# Patient Record
Sex: Female | Born: 1945 | Race: White | Hispanic: No | Smoking: Former smoker
Health system: Southern US, Community
[De-identification: ages and names within clinical notes are randomized; demographics above are authoritative.]

## PROBLEM LIST (undated history)

## (undated) DIAGNOSIS — Z8719 Personal history of other diseases of the digestive system: Secondary | ICD-10-CM

## (undated) DIAGNOSIS — D649 Anemia, unspecified: Secondary | ICD-10-CM

## (undated) DIAGNOSIS — F419 Anxiety disorder, unspecified: Secondary | ICD-10-CM

## (undated) DIAGNOSIS — I10 Essential (primary) hypertension: Secondary | ICD-10-CM

## (undated) DIAGNOSIS — E039 Hypothyroidism, unspecified: Secondary | ICD-10-CM

## (undated) HISTORY — PX: ABDOMINAL HYSTERECTOMY: SHX81

## (undated) HISTORY — PX: TUBAL LIGATION: SHX77

## (undated) HISTORY — PX: TONSILLECTOMY: SUR1361

---

## 2000-05-02 ENCOUNTER — Encounter: Payer: Self-pay | Admitting: *Deleted

## 2000-05-02 ENCOUNTER — Ambulatory Visit (HOSPITAL_COMMUNITY): Admission: RE | Admit: 2000-05-02 | Discharge: 2000-05-02 | Payer: Self-pay | Admitting: *Deleted

## 2001-05-06 ENCOUNTER — Encounter: Payer: Self-pay | Admitting: Specialist

## 2001-05-06 ENCOUNTER — Ambulatory Visit (HOSPITAL_COMMUNITY): Admission: RE | Admit: 2001-05-06 | Discharge: 2001-05-06 | Payer: Self-pay | Admitting: Specialist

## 2001-06-19 ENCOUNTER — Encounter: Payer: Self-pay | Admitting: Internal Medicine

## 2001-06-19 ENCOUNTER — Ambulatory Visit (HOSPITAL_COMMUNITY): Admission: RE | Admit: 2001-06-19 | Discharge: 2001-06-19 | Payer: Self-pay | Admitting: Internal Medicine

## 2001-12-25 ENCOUNTER — Emergency Department (HOSPITAL_COMMUNITY): Admission: EM | Admit: 2001-12-25 | Discharge: 2001-12-25 | Payer: Self-pay | Admitting: Internal Medicine

## 2001-12-25 ENCOUNTER — Encounter: Payer: Self-pay | Admitting: Internal Medicine

## 2002-07-07 ENCOUNTER — Ambulatory Visit (HOSPITAL_COMMUNITY): Admission: RE | Admit: 2002-07-07 | Discharge: 2002-07-07 | Payer: Self-pay | Admitting: General Surgery

## 2002-11-06 ENCOUNTER — Ambulatory Visit (HOSPITAL_COMMUNITY): Admission: RE | Admit: 2002-11-06 | Discharge: 2002-11-06 | Payer: Self-pay | Admitting: Specialist

## 2003-07-14 ENCOUNTER — Ambulatory Visit (HOSPITAL_COMMUNITY): Admission: RE | Admit: 2003-07-14 | Discharge: 2003-07-14 | Payer: Self-pay | Admitting: Internal Medicine

## 2005-09-18 ENCOUNTER — Encounter (INDEPENDENT_AMBULATORY_CARE_PROVIDER_SITE_OTHER): Payer: Self-pay | Admitting: Specialist

## 2005-09-18 ENCOUNTER — Ambulatory Visit (HOSPITAL_COMMUNITY): Admission: RE | Admit: 2005-09-18 | Discharge: 2005-09-18 | Payer: Self-pay | Admitting: General Surgery

## 2005-09-25 ENCOUNTER — Ambulatory Visit (HOSPITAL_COMMUNITY): Admission: RE | Admit: 2005-09-25 | Discharge: 2005-09-25 | Payer: Self-pay | Admitting: Specialist

## 2007-01-27 ENCOUNTER — Ambulatory Visit (HOSPITAL_COMMUNITY): Admission: RE | Admit: 2007-01-27 | Discharge: 2007-01-27 | Payer: Self-pay | Admitting: Obstetrics and Gynecology

## 2007-06-24 ENCOUNTER — Ambulatory Visit (HOSPITAL_COMMUNITY): Admission: RE | Admit: 2007-06-24 | Discharge: 2007-06-24 | Payer: Self-pay | Admitting: Internal Medicine

## 2007-06-25 ENCOUNTER — Ambulatory Visit (HOSPITAL_COMMUNITY): Admission: RE | Admit: 2007-06-25 | Discharge: 2007-06-25 | Payer: Self-pay | Admitting: Internal Medicine

## 2008-12-07 ENCOUNTER — Ambulatory Visit (HOSPITAL_COMMUNITY): Admission: RE | Admit: 2008-12-07 | Discharge: 2008-12-07 | Payer: Self-pay | Admitting: Family Medicine

## 2009-10-12 ENCOUNTER — Encounter
Admission: RE | Admit: 2009-10-12 | Discharge: 2009-10-12 | Payer: Self-pay | Source: Home / Self Care | Attending: Endocrinology | Admitting: Endocrinology

## 2009-10-25 ENCOUNTER — Ambulatory Visit (HOSPITAL_COMMUNITY)
Admission: RE | Admit: 2009-10-25 | Discharge: 2009-10-25 | Payer: Self-pay | Source: Home / Self Care | Admitting: Ophthalmology

## 2009-12-22 ENCOUNTER — Encounter
Admission: RE | Admit: 2009-12-22 | Discharge: 2010-02-07 | Payer: Self-pay | Source: Home / Self Care | Attending: Endocrinology | Admitting: Endocrinology

## 2010-03-22 LAB — POCT I-STAT 4, (NA,K, GLUC, HGB,HCT)
Glucose, Bld: 187 mg/dL — ABNORMAL HIGH (ref 70–99)
HCT: 44 % (ref 36.0–46.0)
Hemoglobin: 15 g/dL (ref 12.0–15.0)
Potassium: 4.3 mEq/L (ref 3.5–5.1)

## 2010-04-21 ENCOUNTER — Other Ambulatory Visit (HOSPITAL_COMMUNITY): Payer: Self-pay | Admitting: Internal Medicine

## 2010-04-21 ENCOUNTER — Ambulatory Visit (HOSPITAL_COMMUNITY)
Admission: RE | Admit: 2010-04-21 | Discharge: 2010-04-21 | Disposition: A | Discharge status: Home / Self Care | Payer: Medicare Other | Source: Ambulatory Visit | Attending: Internal Medicine | Admitting: Internal Medicine

## 2010-04-21 ENCOUNTER — Encounter (HOSPITAL_COMMUNITY): Payer: Self-pay

## 2010-04-21 DIAGNOSIS — M169 Osteoarthritis of hip, unspecified: Secondary | ICD-10-CM

## 2010-04-21 DIAGNOSIS — M25559 Pain in unspecified hip: Secondary | ICD-10-CM | POA: Insufficient documentation

## 2010-04-21 HISTORY — DX: Essential (primary) hypertension: I10

## 2010-05-26 NOTE — H&P (Signed)
NAME:  Erin Hoffman, INSINGA NO.:  [id number]   MEDICAL RECORD NO.:  [id number]                  PATIENT TYPE:   LOCATION:                                       FACILITY:   PHYSICIAN:  Dalia Heading, M.D.               DATE OF BIRTH:  1945/03/15   DATE OF ADMISSION:  07/07/2002  DATE OF DISCHARGE:                                HISTORY & PHYSICAL   CHIEF COMPLAINT:  GERD, epigastric pain, hematochezia.   HISTORY OF PRESENT ILLNESS:  The patient is a 65 year old white female who  is referred for endoscopic evaluation.  She has been having hematochezia and  epigastric pain as well as GERD symptoms recently.  They were made worse  with taking ibuprofen.  She has since stopped the ibuprofen but continues to  have epigastric pain.  She describes substernal chest pain/heartburn.  She  denies any lightheadedness, weight loss, fever, constipation, diarrhea,  melena, or history of peptic ulcer disease.  She denies any hemorrhoidal  problems.  She has never had a colonoscopy.  She denies any immediate family  history of colon carcinoma.   PAST MEDICAL HISTORY:  Unremarkable.   PAST SURGICAL HISTORY:  1. Tonsillectomy.  2. Tubal ligation.  3. Hysterectomy.   CURRENT MEDICATIONS:  Premarin, Xanax, baby aspirin, hydrocodone.   ALLERGIES:  No known drug allergies.   REVIEW OF SYSTEMS:  Noncontributory.   PHYSICAL EXAMINATION:  GENERAL:  Well-developed, well-nourished white female  in no acute distress.  VITAL SIGNS:  Afebrile, and vital signs are stable.  LUNGS:  Clear to auscultation with equal breath sounds bilaterally.  HEART:  Regular rate and rhythm.  Without S3, S4, or murmurs.  ABDOMEN:  Soft, nontender, nondistended.  No hepatosplenomegaly or masses  are noted.  RECTAL:  Deferred until the procedure.   IMPRESSION:  Gastroesophageal reflux disease, epigastric pain, hematochezia.    PLAN:  The patient is scheduled for an EGD and colonoscopy on  July 07, 2002.  The risks and benefits of the procedure including bleeding and perforation  were fully explained to the patient, who gave informed consent.                                               Dalia Heading, M.D.    MAJ/MEDQ  D:  06/25/2002  T:  06/25/2002  Job:  [id number]   cc:   Madelin Rear. Sherwood Gambler, M.D.  [address]  [place]  [place] [address]  Fax: [phone]

## 2010-05-26 NOTE — Op Note (Signed)
NAME:  Erin Hoffman, Erin Hoffman NO.:  [id number]   MEDICAL RECORD NO.:  [id number]          PATIENT TYPE:  AMB   LOCATION:  DAY                          FACILITY:  [facility]   PHYSICIAN:  Adolph Pollack, M.D.DATE OF BIRTH:  [birth year]   DATE OF PROCEDURE:  09/18/2005  DATE OF DISCHARGE:                                 OPERATIVE REPORT   PREOPERATIVE DIAGNOSIS:  Cholelithiasis.   POSTOPERATIVE DIAGNOSIS:  Cholelithiasis.   PROCEDURE:  Laparoscopic cholecystectomy, intraoperative cholangiogram.   SURGEON:  Adolph Pollack, M.D.   ANESTHESIA:  General.   INDICATIONS:  This is a 65 year old female who has had slight elevation of  some of her transaminases.  She also had some intermittent pressure  discomfort of the lower substernal right upper quadrant region with a little  bit of bloating.  Ultrasound demonstrated a 2.8 cm solitary gallstone, a  normal common bile duct.  Because of some of her symptoms and the size of  the gallstone, I suggested elective laparoscopic cholecystectomy.  She is  agreeable.  Preoperatively her SGOT is 54, SGPT 52.   TECHNIQUE:  She was brought to the operating room, placed supine on the  operating table and general anesthetic was administered.  Her abdominal wall  was sterilely prepped and draped.  A supraumbilical incision was made  through skin and subcutaneous tissue, fascia and peritoneum entering the  peritoneal cavity under direct vision.  Omental adhesions were noted under  direct vision.  A pursestring suture of 0 Vicryl was placed around the  fascial edges.  A Hassan trocar was introduced into the peritoneal cavity  and pneumoperitoneum created by insufflation of CO2 gas.   Next a laparoscope was introduced.  The midline demonstrated adhesions  between the omentum and the abdominal wall.  I was able to advance the  laparoscope up toward the left upper quadrant and found a window into the  right upper quadrant.  I placed an  11-mm trocar in the epigastric incision  and two 5 mm trocars and right mid lateral abdomen.  She is placed in  reverse Trendelenburg position and right side tilted slightly upward.   The fundus of the gallbladder was grasped and filmy adhesions between the  omentum, gallbladder duodenum was taken down sharply and bluntly.  The  fundus was then retracted toward the right shoulder and the infundibulum was  mobilized bluntly.  I then was able to isolate the cystic duct and create a  window around it.  A clip was placed at the cystic duct gallbladder  junction.  A small incision was made in the cystic duct and bile milked  back.  A cholangiocath was passed into the cystic duct and cholangiogram was  performed.   Under real time fluoroscopy, dilute contrast material was injected to the  cystic duct which was of long length.  The common hepatic, right hepatic,  and left hepatic ducts were opacified promptly.  The common bile duct  opacified promptly and contrast drained into the duodenum without obvious  evidence of obstruction.   The cholangiocatheter was then removed, the cystic duct was clipped three  times proximally divided.  I then performed somewhat of an inspection of  liver and notice she had diffuse fatty infiltration which may account for  the very mild elevation of her SGOT and SGPT.   Using blunt dissection, I identified both the anterior and posterior  branches of the cystic artery and created windows around them.  There were  clipped and divided.  The gallbladder was dissected free from the liver  intact using electrocautery and placed in an Endopouch bag.  The gallbladder  fossa was copiously irrigated.  It was hemostatic.  No bile leak was noted.  Irrigation was evacuated.   Some adhesions were taken down around the upper midline so I could visualize  the supraumbilical trocar better.  The gallbladder was then removed through  the supraumbilical port in the Endopouch bag.   I partially closed the  fascial defect in the supraumbilical region by tightening and tying down the  pursestring suture.  I added an extra suture to close a small remaining  defect of 0 Vicryl which allowed for adequate fascial closure.   The 5 mm trocars in the right abdomen were removed and the pneumoperitoneum  was released and then an 11-mm trocar in the epigastric region was removed.  The skin incisions were closed with 4-0 Monocryl subcuticular stitches  followed by Steri-Strips and sterile dressings.   She tolerated procedure without apparent complications and was taken to  recovery in satisfactory condition.      Adolph Pollack, M.D.  Electronically Signed     TJR/MEDQ  D:  09/18/2005  T:  09/19/2005  Job:  [id number]   cc:   Madelin Rear. Sherwood Gambler, MD  Fax: [phone]

## 2011-07-23 ENCOUNTER — Other Ambulatory Visit (HOSPITAL_COMMUNITY): Payer: Self-pay | Admitting: Internal Medicine

## 2011-07-23 DIAGNOSIS — Z0000 Encounter for general adult medical examination without abnormal findings: Secondary | ICD-10-CM

## 2011-07-23 DIAGNOSIS — Z139 Encounter for screening, unspecified: Secondary | ICD-10-CM

## 2011-07-26 ENCOUNTER — Other Ambulatory Visit (HOSPITAL_COMMUNITY): Payer: Medicare Other

## 2011-07-31 ENCOUNTER — Ambulatory Visit (HOSPITAL_COMMUNITY)
Admission: RE | Admit: 2011-07-31 | Discharge: 2011-07-31 | Disposition: A | Discharge status: Home / Self Care | Payer: Medicare Other | Source: Ambulatory Visit | Attending: Internal Medicine | Admitting: Internal Medicine

## 2011-07-31 DIAGNOSIS — Z780 Asymptomatic menopausal state: Secondary | ICD-10-CM | POA: Insufficient documentation

## 2011-07-31 DIAGNOSIS — Z139 Encounter for screening, unspecified: Secondary | ICD-10-CM

## 2011-07-31 DIAGNOSIS — Z13820 Encounter for screening for osteoporosis: Secondary | ICD-10-CM | POA: Insufficient documentation

## 2011-07-31 DIAGNOSIS — Z0000 Encounter for general adult medical examination without abnormal findings: Secondary | ICD-10-CM

## 2011-07-31 DIAGNOSIS — Z9079 Acquired absence of other genital organ(s): Secondary | ICD-10-CM | POA: Insufficient documentation

## 2011-09-09 DEATH — deceased

## 2011-10-11 ENCOUNTER — Ambulatory Visit (INDEPENDENT_AMBULATORY_CARE_PROVIDER_SITE_OTHER): Payer: Medicare Other | Admitting: Otolaryngology

## 2011-10-11 DIAGNOSIS — R040 Epistaxis: Secondary | ICD-10-CM

## 2011-11-08 ENCOUNTER — Ambulatory Visit (INDEPENDENT_AMBULATORY_CARE_PROVIDER_SITE_OTHER): Payer: Medicare Other | Admitting: Otolaryngology

## 2011-11-08 DIAGNOSIS — R040 Epistaxis: Secondary | ICD-10-CM

## 2012-01-08 ENCOUNTER — Other Ambulatory Visit (HOSPITAL_COMMUNITY): Payer: Self-pay | Admitting: Internal Medicine

## 2012-01-08 DIAGNOSIS — Z139 Encounter for screening, unspecified: Secondary | ICD-10-CM

## 2012-01-11 ENCOUNTER — Ambulatory Visit (HOSPITAL_COMMUNITY): Payer: Medicare Other

## 2012-01-28 ENCOUNTER — Ambulatory Visit (HOSPITAL_COMMUNITY)
Admission: RE | Admit: 2012-01-28 | Discharge: 2012-01-28 | Disposition: A | Discharge status: Home / Self Care | Payer: Medicare Other | Source: Ambulatory Visit | Attending: Internal Medicine | Admitting: Internal Medicine

## 2012-01-28 DIAGNOSIS — Z1231 Encounter for screening mammogram for malignant neoplasm of breast: Secondary | ICD-10-CM | POA: Insufficient documentation

## 2012-01-28 DIAGNOSIS — Z139 Encounter for screening, unspecified: Secondary | ICD-10-CM

## 2012-01-30 ENCOUNTER — Other Ambulatory Visit: Payer: Self-pay | Admitting: Internal Medicine

## 2012-01-30 DIAGNOSIS — R928 Other abnormal and inconclusive findings on diagnostic imaging of breast: Secondary | ICD-10-CM

## 2012-02-20 ENCOUNTER — Encounter (HOSPITAL_COMMUNITY): Payer: Medicare Other

## 2012-03-05 ENCOUNTER — Ambulatory Visit (HOSPITAL_COMMUNITY)
Admission: RE | Admit: 2012-03-05 | Discharge: 2012-03-05 | Disposition: A | Discharge status: Home / Self Care | Payer: Medicare Other | Source: Ambulatory Visit | Attending: Internal Medicine | Admitting: Internal Medicine

## 2012-03-05 DIAGNOSIS — R928 Other abnormal and inconclusive findings on diagnostic imaging of breast: Secondary | ICD-10-CM | POA: Insufficient documentation

## 2013-05-15 ENCOUNTER — Other Ambulatory Visit (HOSPITAL_COMMUNITY): Payer: Self-pay | Admitting: Internal Medicine

## 2013-05-15 DIAGNOSIS — Z139 Encounter for screening, unspecified: Secondary | ICD-10-CM

## 2013-05-25 ENCOUNTER — Ambulatory Visit (HOSPITAL_COMMUNITY): Payer: Medicare Other

## 2013-05-28 ENCOUNTER — Ambulatory Visit (HOSPITAL_COMMUNITY)
Admission: RE | Admit: 2013-05-28 | Discharge: 2013-05-28 | Disposition: A | Discharge status: Home / Self Care | Payer: Medicare Other | Source: Ambulatory Visit | Attending: Internal Medicine | Admitting: Internal Medicine

## 2013-05-28 ENCOUNTER — Encounter (HOSPITAL_COMMUNITY): Payer: Self-pay | Admitting: Pharmacy Technician

## 2013-05-28 ENCOUNTER — Ambulatory Visit (HOSPITAL_COMMUNITY): Payer: Medicare Other

## 2013-05-28 DIAGNOSIS — Z139 Encounter for screening, unspecified: Secondary | ICD-10-CM

## 2013-05-28 DIAGNOSIS — Z1231 Encounter for screening mammogram for malignant neoplasm of breast: Secondary | ICD-10-CM | POA: Insufficient documentation

## 2013-06-09 NOTE — Patient Instructions (Signed)
GWYNNE MUSCARELLA  06/09/2013   Your procedure is scheduled on:  06/16/13  Report to [facility] at 7:15 AM.  Call this number if you have problems the morning of surgery: [phone]   Remember:   Do not eat food or drink liquids after midnight.   Take these medicines the morning of surgery with A SIP OF WATER: Xanax, Vasotec, Allegra,  Synthroid  DO NOT TAKE AMARYL OR METFORMIN   Do not wear jewelry, make-up or nail polish.  Do not wear lotions, powders, or perfumes. You may wear deodorant.  Do not shave 48 hours prior to surgery. Men may shave face and neck.  Do not bring valuables to the hospital.  [facility] is not responsible for any belongings or valuables.               Contacts, dentures or bridgework may not be worn into surgery.  Leave suitcase in the car. After surgery it may be brought to your room.  For patients admitted to the hospital, discharge time is determined by your treatment team.               Patients discharged the day of surgery will not be allowed to drive home.  Name and phone number of your driver:  Special Instructions: N/A   Please read over the following fact sheets that you were given: Anesthesia Post-op Instructions   PATIENT INSTRUCTIONS POST-ANESTHESIA  IMMEDIATELY FOLLOWING SURGERY:  Do not drive or operate machinery for the first twenty four hours after surgery.  Do not make any important decisions for twenty four hours after surgery or while taking narcotic pain medications or sedatives.  If you develop intractable nausea and vomiting or a severe headache please notify your doctor immediately.  FOLLOW-UP:  Please make an appointment with your surgeon as instructed. You do not need to follow up with anesthesia unless specifically instructed to do so.  WOUND CARE INSTRUCTIONS (if applicable):  Keep a dry clean dressing on the anesthesia/puncture wound site if there is drainage.  Once the wound has quit draining you may leave it open to air.   Generally you should leave the bandage intact for twenty four hours unless there is drainage.  If the epidural site drains for more than 36-48 hours please call the anesthesia department.  QUESTIONS?:  Please feel free to call your physician or the hospital operator if you have any questions, and they will be happy to assist you.      Cataract A cataract is a clouding of the lens of the eye. When a lens becomes cloudy, vision is reduced based on the degree and nature of the clouding. Many cataracts reduce vision to some degree. Some cataracts make people more near-sighted as they develop. Other cataracts increase glare. Cataracts that are ignored and become worse can sometimes look white. The white color can be seen through the pupil. CAUSES   Aging. However, cataracts may occur at any age, even in newborns.  Certain drugs.  Trauma to the eye.  Certain diseases such as diabetes.  Specific eye diseases such as chronic inflammation inside the eye or a sudden attack of a rare form of glaucoma.  Inherited or acquired medical problems. SYMPTOMS   Gradual, progressive drop in vision in the affected eye.  Severe, rapid visual loss. This most often happens when trauma is the cause. DIAGNOSIS  To detect a cataract, an eye doctor examines the lens. Cataracts are best diagnosed with an exam of the  eyes with the pupils enlarged (dilated) by drops.  TREATMENT  For an early cataract, vision may improve by using different eyeglasses or stronger lighting. If that does not help your vision, surgery is the only effective treatment. A cataract needs to be surgically removed when vision loss interferes with your everyday activities, such as driving, reading, or watching TV. A cataract may also have to be removed if it prevents examination or treatment of another eye problem. Surgery removes the cloudy lens and usually replaces it with a substitute lens (intraocular lens, IOL).  At a time when both you and  your doctor agree, the cataract will be surgically removed. If you have cataracts in both eyes, only one is usually removed at a time. This allows the operated eye to heal and be out of danger from any possible problems after surgery (such as infection or poor wound healing). In rare cases, a cataract may be doing damage to your eye. In these cases, your caregiver may advise surgical removal right away. The vast majority of people who have cataract surgery have better vision afterward. HOME CARE INSTRUCTIONS  If you are not planning surgery, you may be asked to do the following:  Use different eyeglasses.  Use stronger or brighter lighting.  Ask your eye doctor about reducing your medicine dose or changing medicines if it is thought that a medicine caused your cataract. Changing medicines does not make the cataract go away on its own.  Become familiar with your surroundings. Poor vision can lead to injury. Avoid bumping into things on the affected side. You are at a higher risk for tripping or falling.  Exercise extreme care when driving or operating machinery.  Wear sunglasses if you are sensitive to bright light or experiencing problems with glare. SEEK IMMEDIATE MEDICAL CARE IF:   You have a worsening or sudden vision loss.  You notice redness, swelling, or increasing pain in the eye.  You have a fever. Document Released: 12/25/2004 Document Revised: 03/19/2011 Document Reviewed: 08/18/2010 [facility]Exit[facility] Patient Information 2014 [employer], [place].

## 2013-06-10 ENCOUNTER — Encounter (HOSPITAL_COMMUNITY)
Admission: RE | Admit: 2013-06-10 | Discharge: 2013-06-10 | Disposition: A | Discharge status: Home / Self Care | Payer: Medicare Other | Source: Ambulatory Visit | Attending: Ophthalmology | Admitting: Ophthalmology

## 2013-06-10 ENCOUNTER — Encounter (HOSPITAL_COMMUNITY): Payer: Self-pay

## 2013-06-10 DIAGNOSIS — Z01810 Encounter for preprocedural cardiovascular examination: Secondary | ICD-10-CM | POA: Insufficient documentation

## 2013-06-10 HISTORY — DX: Personal history of other diseases of the digestive system: Z87.19

## 2013-06-10 HISTORY — DX: Anemia, unspecified: D64.9

## 2013-06-10 HISTORY — DX: Anxiety disorder, unspecified: F41.9

## 2013-06-10 HISTORY — DX: Hypothyroidism, unspecified: E03.9

## 2013-06-16 ENCOUNTER — Encounter (HOSPITAL_COMMUNITY): Payer: Medicare Other | Admitting: Anesthesiology

## 2013-06-16 ENCOUNTER — Encounter (HOSPITAL_COMMUNITY): Payer: Self-pay | Admitting: *Deleted

## 2013-06-16 ENCOUNTER — Ambulatory Visit (HOSPITAL_COMMUNITY)
Admission: RE | Admit: 2013-06-16 | Discharge: 2013-06-16 | Disposition: A | Discharge status: Home / Self Care | Payer: Medicare Other | Source: Ambulatory Visit | Attending: Ophthalmology | Admitting: Ophthalmology

## 2013-06-16 ENCOUNTER — Ambulatory Visit (HOSPITAL_COMMUNITY): Payer: Medicare Other | Admitting: Anesthesiology

## 2013-06-16 ENCOUNTER — Encounter (HOSPITAL_COMMUNITY)
Admission: RE | Disposition: A | Discharge status: Home / Self Care | Payer: Self-pay | Source: Ambulatory Visit | Attending: Ophthalmology

## 2013-06-16 DIAGNOSIS — E119 Type 2 diabetes mellitus without complications: Secondary | ICD-10-CM | POA: Insufficient documentation

## 2013-06-16 DIAGNOSIS — F411 Generalized anxiety disorder: Secondary | ICD-10-CM | POA: Insufficient documentation

## 2013-06-16 DIAGNOSIS — Z794 Long term (current) use of insulin: Secondary | ICD-10-CM | POA: Insufficient documentation

## 2013-06-16 DIAGNOSIS — I10 Essential (primary) hypertension: Secondary | ICD-10-CM | POA: Insufficient documentation

## 2013-06-16 DIAGNOSIS — Z79899 Other long term (current) drug therapy: Secondary | ICD-10-CM | POA: Insufficient documentation

## 2013-06-16 DIAGNOSIS — H2510 Age-related nuclear cataract, unspecified eye: Secondary | ICD-10-CM | POA: Insufficient documentation

## 2013-06-16 HISTORY — PX: CATARACT EXTRACTION W/PHACO: SHX586

## 2013-06-16 LAB — GLUCOSE, CAPILLARY: GLUCOSE-CAPILLARY: 150 mg/dL — AB (ref 70–99)

## 2013-06-16 SURGERY — PHACOEMULSIFICATION, CATARACT, WITH IOL INSERTION
Anesthesia: Monitor Anesthesia Care | Site: Eye | Laterality: Left

## 2013-06-16 MED ORDER — PHENYLEPHRINE HCL 2.5 % OP SOLN
OPHTHALMIC | Status: AC
  Filled 2013-06-16: qty 15

## 2013-06-16 MED ORDER — MIDAZOLAM HCL 2 MG/2ML IJ SOLN
INTRAMUSCULAR | Status: AC
  Filled 2013-06-16: qty 2

## 2013-06-16 MED ORDER — BSS IO SOLN
INTRAOCULAR | Status: DC | PRN
  Administered 2013-06-16: 08:00:00

## 2013-06-16 MED ORDER — PROVISC 10 MG/ML IO SOLN
INTRAOCULAR | Status: DC | PRN
  Administered 2013-06-16: 0.85 mL via INTRAOCULAR

## 2013-06-16 MED ORDER — FENTANYL CITRATE 0.05 MG/ML IJ SOLN
25.0000 ug | INTRAMUSCULAR | Status: AC
  Administered 2013-06-16 (×2): 25 ug via INTRAVENOUS

## 2013-06-16 MED ORDER — BSS IO SOLN
INTRAOCULAR | Status: DC | PRN
  Administered 2013-06-16: 15 mL

## 2013-06-16 MED ORDER — MIDAZOLAM HCL 2 MG/2ML IJ SOLN: 1.0000 mg | INTRAMUSCULAR | Status: DC | PRN

## 2013-06-16 MED ORDER — LIDOCAINE HCL 3.5 % OP GEL: 1.0000 "application " | Freq: Once | OPHTHALMIC | Status: DC

## 2013-06-16 MED ORDER — KETOROLAC TROMETHAMINE 0.5 % OP SOLN
1.0000 [drp] | OPHTHALMIC | Status: AC
  Administered 2013-06-16 (×3): 1 [drp] via OPHTHALMIC

## 2013-06-16 MED ORDER — LIDOCAINE HCL (PF) 1 % IJ SOLN
INTRAMUSCULAR | Status: AC
  Filled 2013-06-16: qty 2

## 2013-06-16 MED ORDER — CYCLOPENTOLATE-PHENYLEPHRINE OP SOLN OPTIME - NO CHARGE
OPHTHALMIC | Status: AC
  Filled 2013-06-16: qty 2

## 2013-06-16 MED ORDER — LACTATED RINGERS IV SOLN
INTRAVENOUS | Status: DC
  Administered 2013-06-16: 08:00:00 via INTRAVENOUS

## 2013-06-16 MED ORDER — PHENYLEPHRINE HCL 2.5 % OP SOLN
1.0000 [drp] | OPHTHALMIC | Status: AC
  Administered 2013-06-16 (×3): 1 [drp] via OPHTHALMIC

## 2013-06-16 MED ORDER — TETRACAINE HCL 0.5 % OP SOLN
OPHTHALMIC | Status: AC
  Filled 2013-06-16: qty 2

## 2013-06-16 MED ORDER — CYCLOPENTOLATE-PHENYLEPHRINE 0.2-1 % OP SOLN
1.0000 [drp] | OPHTHALMIC | Status: AC
  Administered 2013-06-16 (×3): 1 [drp] via OPHTHALMIC

## 2013-06-16 MED ORDER — LACTATED RINGERS IV SOLN
INTRAVENOUS | Status: DC | PRN
  Administered 2013-06-16: 08:00:00 via INTRAVENOUS

## 2013-06-16 MED ORDER — KETOROLAC TROMETHAMINE 0.5 % OP SOLN
OPHTHALMIC | Status: AC
  Filled 2013-06-16: qty 5

## 2013-06-16 MED ORDER — TETRACAINE HCL 0.5 % OP SOLN
1.0000 [drp] | OPHTHALMIC | Status: AC
  Administered 2013-06-16 (×3): 1 [drp] via OPHTHALMIC

## 2013-06-16 MED ORDER — FENTANYL CITRATE 0.05 MG/ML IJ SOLN
INTRAMUSCULAR | Status: AC
  Filled 2013-06-16: qty 2

## 2013-06-16 MED ORDER — TETRACAINE 0.5 % OP SOLN OPTIME - NO CHARGE
OPHTHALMIC | Status: DC | PRN
  Administered 2013-06-16: 1 [drp] via OPHTHALMIC

## 2013-06-16 MED ORDER — EPINEPHRINE HCL 1 MG/ML IJ SOLN
INTRAMUSCULAR | Status: AC
  Filled 2013-06-16: qty 1

## 2013-06-16 SURGICAL SUPPLY — 26 items
CAPSULAR TENSION RING-AMO (OPHTHALMIC RELATED) IMPLANT
CLOTH BEACON ORANGE TIMEOUT ST (SAFETY) ×2 IMPLANT
EYE SHIELD UNIVERSAL CLEAR (GAUZE/BANDAGES/DRESSINGS) ×2 IMPLANT
GLOVE BIO SURGEON STRL SZ 6.5 (GLOVE) ×1 IMPLANT
GLOVE BIO SURGEONS STRL SZ 6.5 (GLOVE) ×1
GLOVE BIOGEL PI IND STRL 7.5 (GLOVE) IMPLANT
GLOVE BIOGEL PI INDICATOR 7.5 (GLOVE) ×2
GLOVE ECLIPSE 6.5 STRL STRAW (GLOVE) IMPLANT
GLOVE ECLIPSE 7.0 STRL STRAW (GLOVE) IMPLANT
GLOVE EXAM NITRILE LRG STRL (GLOVE) IMPLANT
GLOVE EXAM NITRILE MD LF STRL (GLOVE) IMPLANT
GLOVE SKINSENSE NS SZ6.5 (GLOVE)
GLOVE SKINSENSE STRL SZ6.5 (GLOVE) IMPLANT
HEALON 5 0.6 ML (INTRAOCULAR LENS) IMPLANT
KIT VITRECTOMY (OPHTHALMIC RELATED) IMPLANT
PAD ARMBOARD 7.5X6 YLW CONV (MISCELLANEOUS) ×3 IMPLANT
PROC W NO LENS (INTRAOCULAR LENS)
PROC W SPEC LENS (INTRAOCULAR LENS)
PROCESS W NO LENS (INTRAOCULAR LENS) IMPLANT
PROCESS W SPEC LENS (INTRAOCULAR LENS) IMPLANT
RING MALYGIN (MISCELLANEOUS) ×2 IMPLANT
SIGHTPATH CAT PROC W REG LENS (Ophthalmic Related) ×3 IMPLANT
TAPE SURG TRANSPORE 1 IN (GAUZE/BANDAGES/DRESSINGS) IMPLANT
TAPE SURGICAL TRANSPORE 1 IN (GAUZE/BANDAGES/DRESSINGS) ×2
VISCOELASTIC ADDITIONAL (OPHTHALMIC RELATED) IMPLANT
WATER STERILE IRR 250ML POUR (IV SOLUTION) ×3 IMPLANT

## 2013-06-16 NOTE — Discharge Instructions (Signed)
Erin Hoffman  06/16/2013           [facility] Instructions [address] [place] [address]      1. Avoid closing eyes tightly. One often closes the eye tightly when laughing, talking, sneezing, coughing or if they feel irritated. At these times, you should be careful not to close your eyes tightly.  2. Instill eye drops as instructed. To instill drops in your eye, open it, look up and have someone gently pull the lower lid down and instill a couple of drops inside the lower lid.  3. Do not touch upper lid.  4. Take Advil or Tylenol for pain.  5. You may use either eye for near work, such as reading or sewing and you may watch television.  6. You may have your hair done at the beauty parlor at any time.  7. Wear dark glasses with or without your own glasses if you are in bright light.  8. Call our office at [phone] or [phone] if you have sharp pain in your eye or unusual symptoms.  9. Do not be concerned because vision in the operative eye is not good. It will not be good, no matter how successful the operation, until you get a special lens for it. Your old glasses will not be suited to the new eye that was operated on and you will not be ready for a new lens for about a month.  10. Follow up at the [facility] office.  Today at 2-3 pm    I have received a copy of the above instructions and will follow them.

## 2013-06-16 NOTE — Op Note (Signed)
Patient brought to the operating room and prepped and draped in the usual manner.  Lid speculum inserted in left eye.  Stab incision made at the twelve o'clock position.  Provisc instilled in the anterior chamber.   A 2.4 mm. Stab incision was made temporally. Due to a small pupil, a Malugyn Ring was inserter.  An anterior capsulotomy was done with a bent 25 gauge needle.  The nucleus was hydrodissected.  The Phaco tip was inserted in the anterior chamber and the nucleus was emulsified.  CDE was 4.65.  The cortical material was then removed with the I and A tip.  Posterior capsule was the polished.  The anterior chamber was deepened with Provisc.  A 23.0 Alcon [id number] IOL was then inserted in the capsular bag. The Malugyn Ring was removed.  Provisc was then removed with the I and A tip.  The wound was then hydrated.  Patient sent to the Recovery Room in good condition with follow up in my office.  Preoperative Diagnosis:  Nuclear Cataract OS Postoperative Diagnosis:  Same Procedure name: Kelman Phacoemulsification OS with IOL

## 2013-06-16 NOTE — Anesthesia Postprocedure Evaluation (Signed)
  Anesthesia Post-op Note  Patient: Erin Hoffman  Procedure(s) Performed: Procedure(s) with comments: CATARACT EXTRACTION PHACO AND INTRAOCULAR LENS PLACEMENT (IOC) (Left) - CDE:4.65  Patient Location: Short Stay  Anesthesia Type:MAC  Level of Consciousness: awake, alert  and oriented  Airway and Oxygen Therapy: Patient Spontanous Breathing  Post-op Pain: none  Post-op Assessment: Post-op Vital signs reviewed, Patient's Cardiovascular Status Stable, Respiratory Function Stable, Patent Airway and No signs of Nausea or vomiting  Post-op Vital Signs: Reviewed and stable  Last Vitals:  Filed Vitals:   06/16/13 0750  Temp:   Resp: 23    Complications: No apparent anesthesia complications

## 2013-06-16 NOTE — H&P (Signed)
The patient was re examined and there is no change in the patients condition since the original H and P. 

## 2013-06-16 NOTE — Anesthesia Preprocedure Evaluation (Signed)
Anesthesia Evaluation  Patient identified by MRN, date of birth, ID band Patient awake    Reviewed: Allergy & Precautions, H&P , NPO status , Patient's Chart, lab work & pertinent test results  Airway Mallampati: II TM Distance: >3 FB     Dental  (+) Teeth Intact   Pulmonary former smoker,  breath sounds clear to auscultation        Cardiovascular hypertension, Pt. on medications Rhythm:Regular Rate:Normal     Neuro/Psych PSYCHIATRIC DISORDERS Anxiety    GI/Hepatic hiatal hernia,   Endo/Other  diabetes, Well Controlled, Type 2, Oral Hypoglycemic AgentsHypothyroidism   Renal/GU      Musculoskeletal   Abdominal   Peds  Hematology  (+) anemia ,   Anesthesia Other Findings   Reproductive/Obstetrics                           Anesthesia Physical Anesthesia Plan  ASA: II  Anesthesia Plan: MAC   Post-op Pain Management:    Induction: Intravenous  Airway Management Planned: Nasal Cannula  Additional Equipment:   Intra-op Plan:   Post-operative Plan:   Informed Consent: I have reviewed the patients History and Physical, chart, labs and discussed the procedure including the risks, benefits and alternatives for the proposed anesthesia with the patient or authorized representative who has indicated his/her understanding and acceptance.     Plan Discussed with:   Anesthesia Plan Comments:         Anesthesia Quick Evaluation

## 2013-06-16 NOTE — Transfer of Care (Signed)
Immediate Anesthesia Transfer of Care Note  Patient: Erin Hoffman  Procedure(s) Performed: Procedure(s) with comments: CATARACT EXTRACTION PHACO AND INTRAOCULAR LENS PLACEMENT (IOC) (Left) - CDE:4.65  Patient Location: Short Stay  Anesthesia Type:MAC  Level of Consciousness: awake  Airway & Oxygen Therapy: Patient Spontanous Breathing  Post-op Assessment: Report given to PACU RN  Post vital signs: Reviewed  Complications: No apparent anesthesia complications

## 2013-06-17 ENCOUNTER — Encounter (HOSPITAL_COMMUNITY): Payer: Self-pay | Admitting: Ophthalmology

## 2014-01-12 ENCOUNTER — Encounter (INDEPENDENT_AMBULATORY_CARE_PROVIDER_SITE_OTHER): Payer: Self-pay | Admitting: *Deleted

## 2014-07-28 ENCOUNTER — Other Ambulatory Visit (HOSPITAL_COMMUNITY): Payer: Self-pay | Admitting: Internal Medicine

## 2014-07-28 DIAGNOSIS — Z1231 Encounter for screening mammogram for malignant neoplasm of breast: Secondary | ICD-10-CM

## 2014-07-30 ENCOUNTER — Ambulatory Visit (HOSPITAL_COMMUNITY)
Admission: RE | Admit: 2014-07-30 | Discharge: 2014-07-30 | Disposition: A | Discharge status: Home / Self Care | Payer: Medicare Other | Source: Ambulatory Visit | Attending: Internal Medicine | Admitting: Internal Medicine

## 2014-07-30 DIAGNOSIS — Z1231 Encounter for screening mammogram for malignant neoplasm of breast: Secondary | ICD-10-CM | POA: Diagnosis present

## 2014-08-25 ENCOUNTER — Encounter (INDEPENDENT_AMBULATORY_CARE_PROVIDER_SITE_OTHER): Payer: Self-pay | Admitting: *Deleted

## 2014-08-31 ENCOUNTER — Other Ambulatory Visit (HOSPITAL_COMMUNITY): Payer: Self-pay | Admitting: Internal Medicine

## 2014-08-31 DIAGNOSIS — M549 Dorsalgia, unspecified: Secondary | ICD-10-CM

## 2014-09-03 ENCOUNTER — Encounter (INDEPENDENT_AMBULATORY_CARE_PROVIDER_SITE_OTHER): Payer: Self-pay | Admitting: *Deleted

## 2014-09-03 ENCOUNTER — Telehealth (INDEPENDENT_AMBULATORY_CARE_PROVIDER_SITE_OTHER): Payer: Self-pay | Admitting: *Deleted

## 2014-09-03 ENCOUNTER — Other Ambulatory Visit (INDEPENDENT_AMBULATORY_CARE_PROVIDER_SITE_OTHER): Payer: Self-pay | Admitting: *Deleted

## 2014-09-03 DIAGNOSIS — Z1211 Encounter for screening for malignant neoplasm of colon: Secondary | ICD-10-CM

## 2014-09-03 NOTE — Telephone Encounter (Signed)
Referring MD/PCP: fusco   Procedure: tcs  Reason/Indication:  screening  Has patient had this procedure before?  Yes, 10 yrs ago  If so, when, by whom and where?    Is there a family history of colon cancer?  yes  Who?  What age when diagnosed?    Is patient diabetic?   no      Does patient have prosthetic heart valve?  no  Do you have a pacemaker?  no  Has patient ever had endocarditis? no  Has patient had joint replacement within last 12 months?  no  Does patient tend to be constipated or take laxatives? no  Does patient have a history of alcohol/drug use? no  Is patient on Coumadin, Plavix and/or Aspirin? no  Medications: metformin 500 mg 2 tab bid, glimepiride  4 mg bid, alprazolam 0.5 mg nightly, fenofibrate 134 mg daily, fish oil 1400 mg daily, vit b12 injection monthly, enalapril 2.5 mg daily, levothyroxine 137 mg daily, allegra prn  Allergies: nkda  Medication Adjustment: hold DM med evening before & morning of  Procedure date & time: 10/01/14 at 1120

## 2014-09-03 NOTE — Telephone Encounter (Signed)
Patient needs trilyte 

## 2014-09-09 MED ORDER — PEG 3350-KCL-NA BICARB-NACL 420 G PO SOLR: 4000.0000 mL | Freq: Once | ORAL | Status: DC

## 2014-09-09 NOTE — Telephone Encounter (Signed)
agree

## 2014-10-01 ENCOUNTER — Encounter (HOSPITAL_COMMUNITY): Payer: Self-pay | Admitting: *Deleted

## 2014-10-01 ENCOUNTER — Ambulatory Visit (HOSPITAL_COMMUNITY)
Admission: RE | Admit: 2014-10-01 | Discharge: 2014-10-01 | Disposition: A | Discharge status: Home / Self Care | Payer: Medicare Other | Source: Ambulatory Visit | Attending: Internal Medicine | Admitting: Internal Medicine

## 2014-10-01 ENCOUNTER — Encounter (HOSPITAL_COMMUNITY)
Admission: RE | Disposition: A | Discharge status: Home / Self Care | Payer: Self-pay | Source: Ambulatory Visit | Attending: Internal Medicine

## 2014-10-01 DIAGNOSIS — Z79899 Other long term (current) drug therapy: Secondary | ICD-10-CM | POA: Insufficient documentation

## 2014-10-01 DIAGNOSIS — E039 Hypothyroidism, unspecified: Secondary | ICD-10-CM | POA: Insufficient documentation

## 2014-10-01 DIAGNOSIS — I10 Essential (primary) hypertension: Secondary | ICD-10-CM | POA: Diagnosis not present

## 2014-10-01 DIAGNOSIS — Z87891 Personal history of nicotine dependence: Secondary | ICD-10-CM | POA: Diagnosis not present

## 2014-10-01 DIAGNOSIS — Z794 Long term (current) use of insulin: Secondary | ICD-10-CM | POA: Diagnosis not present

## 2014-10-01 DIAGNOSIS — K5730 Diverticulosis of large intestine without perforation or abscess without bleeding: Secondary | ICD-10-CM

## 2014-10-01 DIAGNOSIS — K648 Other hemorrhoids: Secondary | ICD-10-CM

## 2014-10-01 DIAGNOSIS — Z1211 Encounter for screening for malignant neoplasm of colon: Secondary | ICD-10-CM | POA: Diagnosis not present

## 2014-10-01 DIAGNOSIS — E119 Type 2 diabetes mellitus without complications: Secondary | ICD-10-CM | POA: Diagnosis not present

## 2014-10-01 DIAGNOSIS — F419 Anxiety disorder, unspecified: Secondary | ICD-10-CM | POA: Diagnosis not present

## 2014-10-01 DIAGNOSIS — Q438 Other specified congenital malformations of intestine: Secondary | ICD-10-CM | POA: Diagnosis not present

## 2014-10-01 HISTORY — PX: COLONOSCOPY: SHX5424

## 2014-10-01 LAB — GLUCOSE, CAPILLARY: GLUCOSE-CAPILLARY: 189 mg/dL — AB (ref 65–99)

## 2014-10-01 SURGERY — COLONOSCOPY
Anesthesia: Moderate Sedation

## 2014-10-01 MED ORDER — MEPERIDINE HCL 50 MG/ML IJ SOLN
INTRAMUSCULAR | Status: DC | PRN
  Administered 2014-10-01 (×4): 25 mg via INTRAVENOUS

## 2014-10-01 MED ORDER — MEPERIDINE HCL 50 MG/ML IJ SOLN
INTRAMUSCULAR | Status: AC
  Filled 2014-10-01: qty 1

## 2014-10-01 MED ORDER — SODIUM CHLORIDE 0.9 % IV SOLN
INTRAVENOUS | Status: DC
  Administered 2014-10-01: 1000 mL via INTRAVENOUS

## 2014-10-01 MED ORDER — STERILE WATER FOR IRRIGATION IR SOLN
Status: DC | PRN
  Administered 2014-10-01: 11:00:00

## 2014-10-01 MED ORDER — MIDAZOLAM HCL 5 MG/5ML IJ SOLN
INTRAMUSCULAR | Status: DC | PRN
  Administered 2014-10-01 (×3): 2 mg via INTRAVENOUS
  Administered 2014-10-01: 1 mg via INTRAVENOUS
  Administered 2014-10-01: 2 mg via INTRAVENOUS

## 2014-10-01 MED ORDER — MIDAZOLAM HCL 5 MG/5ML IJ SOLN
INTRAMUSCULAR | Status: AC
  Filled 2014-10-01: qty 10

## 2014-10-01 NOTE — Discharge Instructions (Signed)
Resume usual medications and diet. No driving for 24 hours. Virtual colonoscopy be scheduled in 3-4 weeks.     Colonoscopy, Care After These instructions give you information on caring for yourself after your procedure. Your doctor may also give you more specific instructions. Call your doctor if you have any problems or questions after your procedure. HOME CARE  Do not drive for 24 hours.  Do not sign important papers or use machinery for 24 hours.  You may shower.  You may go back to your usual activities, but go slower for the first 24 hours.  Take rest breaks often during the first 24 hours.  Walk around or use warm packs on your belly (abdomen) if you have belly cramping or gas.  Drink enough fluids to keep your pee (urine) clear or pale yellow.  Resume your normal diet. Avoid heavy or fried foods.  Avoid drinking alcohol for 24 hours or as told by your doctor.  Only take medicines as told by your doctor. If a tissue sample (biopsy) was taken during the procedure:   Do not take aspirin or blood thinners for 7 days, or as told by your doctor.  Do not drink alcohol for 7 days, or as told by your doctor.  Eat soft foods for the first 24 hours. GET HELP IF: You still have a small amount of blood in your poop (stool) 2-3 days after the procedure. GET HELP RIGHT AWAY IF:  You have more than a small amount of blood in your poop.  You see clumps of tissue (blood clots) in your poop.  Your belly is puffy (swollen).  You feel sick to your stomach (nauseous) or throw up (vomit).  You have a fever.  You have belly pain that gets worse and medicine does not help. MAKE SURE YOU:  Understand these instructions.  Will watch your condition.  Will get help right away if you are not doing well or get worse. Document Released: 01/27/2010 Document Revised: 12/30/2012 Document Reviewed: 09/01/2012 [facility] Patient Information 2015 [place], [place]. This information is not  intended to replace advice given to you by your health care provider. Make sure you discuss any questions you have with your health care provider.   Diverticulosis Diverticulosis is the condition that develops when small pouches (diverticula) form in the wall of your colon. Your colon, or large intestine, is where water is absorbed and stool is formed. The pouches form when the inside layer of your colon pushes through weak spots in the outer layers of your colon. CAUSES  No one knows exactly what causes diverticulosis. RISK FACTORS  Being older than 50. Your risk for this condition increases with age. Diverticulosis is rare in people younger than 40 years. By age 31, almost everyone has it.  Eating a low-fiber diet.  Being frequently constipated.  Being overweight.  Not getting enough exercise.  Smoking.  Taking over-the-counter pain medicines, like aspirin and ibuprofen. SYMPTOMS  Most people with diverticulosis do not have symptoms. DIAGNOSIS  Because diverticulosis often has no symptoms, health care providers often discover the condition during an exam for other colon problems. In many cases, a health care provider will diagnose diverticulosis while using a flexible scope to examine the colon (colonoscopy). TREATMENT  If you have never developed an infection related to diverticulosis, you may not need treatment. If you have had an infection before, treatment may include:  Eating more fruits, vegetables, and grains.  Taking a fiber supplement.  Taking a live bacteria  supplement (probiotic).  Taking medicine to relax your colon. HOME CARE INSTRUCTIONS   Drink at least 6-8 glasses of water each day to prevent constipation.  Try not to strain when you have a bowel movement.  Keep all follow-up appointments. If you have had an infection before:  Increase the fiber in your diet as directed by your health care provider or dietitian.  Take a dietary fiber supplement if  your health care provider approves.  Only take medicines as directed by your health care provider. SEEK MEDICAL CARE IF:   You have abdominal pain.  You have bloating.  You have cramps.  You have not gone to the bathroom in 3 days. SEEK IMMEDIATE MEDICAL CARE IF:   Your pain gets worse.  Yourbloating becomes very bad.  You have a fever or chills, and your symptoms suddenly get worse.  You begin vomiting.  You have bowel movements that are bloody or black. MAKE SURE YOU:  Understand these instructions.  Will watch your condition.  Will get help right away if you are not doing well or get worse. Document Released: 09/22/2003 Document Revised: 12/30/2012 Document Reviewed: 11/19/2012 [facility] Patient Information 2015 [place], [place]. This information is not intended to replace advice given to you by your health care provider. Make sure you discuss any questions you have with your health care provider.   Hemorrhoids Hemorrhoids are swollen veins around the rectum or anus. There are two types of hemorrhoids:   Internal hemorrhoids. These occur in the veins just inside the rectum. They may poke through to the outside and become irritated and painful.  External hemorrhoids. These occur in the veins outside the anus and can be felt as a painful swelling or hard lump near the anus. CAUSES  Pregnancy.   Obesity.   Constipation or diarrhea.   Straining to have a bowel movement.   Sitting for long periods on the toilet.  Heavy lifting or other activity that caused you to strain.  Anal intercourse. SYMPTOMS   Pain.   Anal itching or irritation.   Rectal bleeding.   Fecal leakage.   Anal swelling.   One or more lumps around the anus.  DIAGNOSIS  Your caregiver may be able to diagnose hemorrhoids by visual examination. Other examinations or tests that may be performed include:   Examination of the rectal area with a gloved hand (digital rectal exam).    Examination of anal canal using a small tube (scope).   A blood test if you have lost a significant amount of blood.  A test to look inside the colon (sigmoidoscopy or colonoscopy). TREATMENT Most hemorrhoids can be treated at home. However, if symptoms do not seem to be getting better or if you have a lot of rectal bleeding, your caregiver may perform a procedure to help make the hemorrhoids get smaller or remove them completely. Possible treatments include:   Placing a rubber band at the base of the hemorrhoid to cut off the circulation (rubber band ligation).   Injecting a chemical to shrink the hemorrhoid (sclerotherapy).   Using a tool to burn the hemorrhoid (infrared light therapy).   Surgically removing the hemorrhoid (hemorrhoidectomy).   Stapling the hemorrhoid to block blood flow to the tissue (hemorrhoid stapling).  HOME CARE INSTRUCTIONS   Eat foods with fiber, such as whole grains, beans, nuts, fruits, and vegetables. Ask your doctor about taking products with added fiber in them (fibersupplements).  Increase fluid intake. Drink enough water and fluids to keep your  urine clear or pale yellow.   Exercise regularly.   Go to the bathroom when you have the urge to have a bowel movement. Do not wait.   Avoid straining to have bowel movements.   Keep the anal area dry and clean. Use wet toilet paper or moist towelettes after a bowel movement.   Medicated creams and suppositories may be used or applied as directed.   Only take over-the-counter or prescription medicines as directed by your caregiver.   Take warm sitz baths for 15-20 minutes, 3-4 times a day to ease pain and discomfort.   Place ice packs on the hemorrhoids if they are tender and swollen. Using ice packs between sitz baths may be helpful.   Put ice in a plastic bag.   Place a towel between your skin and the bag.   Leave the ice on for 15-20 minutes, 3-4 times a day.   Do not use  a donut-shaped pillow or sit on the toilet for long periods. This increases blood pooling and pain.  SEEK MEDICAL CARE IF:  You have increasing pain and swelling that is not controlled by treatment or medicine.  You have uncontrolled bleeding.  You have difficulty or you are unable to have a bowel movement.  You have pain or inflammation outside the area of the hemorrhoids. MAKE SURE YOU:  Understand these instructions.  Will watch your condition.  Will get help right away if you are not doing well or get worse. Document Released: 12/23/1999 Document Revised: 12/12/2011 Document Reviewed: 10/30/2011 [facility] Patient Information 2015 [place], [place]. This information is not intended to replace advice given to you by your health care provider. Make sure you discuss any questions you have with your health care provider.

## 2014-10-01 NOTE — H&P (Signed)
Erin Hoffman is an 69 y.o. female.   Chief Complaint: Patient is here for colonoscopy. HPI: This 69 year old Caucasian female who is here for screening colonoscopy. She denies abdominal pain change in bowel habits or rectal bleeding. Last exam was 11 years ago. Family history is negative for CRC.  Past Medical History  Diagnosis Date  . Diabetes mellitus   . Hypertension   . Hypothyroidism   . Anxiety   . H/O hiatal hernia   . Anemia     Past Surgical History  Procedure Laterality Date  . Tubal ligation    . Abdominal hysterectomy    . Tonsillectomy    . Cataract extraction w/phaco Left 06/16/2013    Procedure: CATARACT EXTRACTION PHACO AND INTRAOCULAR LENS PLACEMENT (IOC);  Surgeon: Elta Guadeloupe T. Gershon Crane, MD;  Location: [facility];  Service: Ophthalmology;  Laterality: Left;  CDE:4.65    Family History  Problem Relation Age of Onset  . Rheum arthritis Mother   . Brain cancer Father   . Crohn's disease Sister   . Coronary artery disease Sister   . Multiple myeloma Brother   . Diabetes Brother   . Hypertension Brother    Social History:  reports that she quit smoking about 26 years ago. Her smoking use included Cigarettes. She has a 30 pack-year smoking history. She does not have any smokeless tobacco history on file. She reports that she does not drink alcohol or use illicit drugs.  Allergies: No Known Allergies  Medications Prior to Admission  Medication Sig Dispense Refill  . ALPRAZolam (XANAX) 0.5 MG tablet Take 0.5 mg by mouth at bedtime as needed for anxiety.    . cyanocobalamin (,VITAMIN B-12,) 1000 MCG/ML injection Inject 1,000 mcg into the muscle every 30 (thirty) days.    . enalapril (VASOTEC) 2.5 MG tablet Take 2.5 mg by mouth daily.    . fenofibrate micronized (LOFIBRA) 134 MG capsule Take 134 mg by mouth daily before breakfast.    . fexofenadine (ALLEGRA) 180 MG tablet Take 180 mg by mouth daily as needed for allergies or rhinitis.    Marland Kitchen glimepiride (AMARYL) 4 MG  tablet Take 4 mg by mouth 2 (two) times daily.    . insulin glargine (LANTUS) 100 UNIT/ML injection Inject 40 Units into the skin at bedtime.    Marland Kitchen levothyroxine (SYNTHROID, LEVOTHROID) 137 MCG tablet Take 137 mcg by mouth daily before breakfast.    . metFORMIN (GLUCOPHAGE) 500 MG tablet Take 1,000 mg by mouth 2 (two) times daily with a meal.    . Omega-3 Fatty Acids (FISH OIL) 1200 MG CAPS Take 1 capsule by mouth daily.     . polyethylene glycol-electrolytes (NULYTELY/GOLYTELY) 420 G solution Take 4,000 mLs by mouth once. 4000 mL 0    No results found for this or any previous visit (from the past 48 hour(s)). No results found.  ROS  Blood pressure 163/85, pulse 79, temperature 98.5 F (36.9 C), temperature source Oral, resp. rate 12, height 5' 9.5" (1.765 m), weight 202 lb (91.627 kg), SpO2 97 %. Physical Exam  Constitutional: She appears well-developed and well-nourished.  HENT:  Mouth/Throat: Oropharynx is clear and moist.  Eyes: Conjunctivae are normal. No scleral icterus.  Neck: No thyromegaly present.  Cardiovascular: Normal rate, regular rhythm and normal heart sounds.   No murmur heard. GI: Soft. She exhibits no distension and no mass. There is no tenderness.  Lower midline scar  Musculoskeletal: She exhibits no edema.  Lymphadenopathy:    She has no cervical  adenopathy.  Neurological: She is alert.  Skin: Skin is warm and dry.     Assessment/Plan Average risk screening colonoscopy.  REHMAN,NAJEEB U 10/01/2014, 11:18 AM

## 2014-10-01 NOTE — Op Note (Signed)
COLONOSCOPY PROCEDURE REPORT  PATIENT:  Erin Hoffman  MR#:  [id number] Birthdate:  1945/09/30, 69 y.o., female Endoscopist:  Dr. Malissa Hippo, MD Referred By:  Dr. Cassell Smiles, MD  Procedure Date: 10/01/2014  Procedure:   Colonoscopy  Indications:  Average risk screening colonoscopy  Informed Consent:  The procedure and risks were reviewed with the patient and informed consent was obtained.  Medications:  Demerol 100 mg IV Versed 9 mg IV  Description of procedure:  After a digital rectal exam was performed, that colonoscope was advanced from the anus through the rectum and colon to the area of  ileocecal valve blind and could not be seen.  From the level of  ileocecal valve, the scope was slowly and cautiously withdrawn. The mucosal surfaces were carefully surveyed utilizing scope tip to flexion to facilitate fold flattening as needed. The scope was pulled down into the rectum where a thorough exam including retroflexion was performed. Slim scope was also used for this procedure.  Findings:   Prep satisfactory. Blent end of the cecum could not be examined. Redundant colon 2 small diverticula at transverse colon. Normal rectal mucosa. Prominent hemorrhoids above the dentate line.    Therapeutic/Diagnostic Maneuvers Performed:   None  Complications:  None  EBL: none  Cecal Withdrawal Time:  NA  Impression:  Incomplete exam since blunt end of cecum could not be examined. Redundant colon with two small diverticula transverse colon. Internal hemorrhoids.   Recommendations:  Standard instructions given. Partial colonoscopy to be scheduled in 3-4 weeks.  REHMAN,NAJEEB U  10/01/2014 12:31 PM  CC: Dr. Cassell Smiles., MD & Dr. Bonnetta Barry ref. provider found

## 2014-10-06 ENCOUNTER — Encounter (HOSPITAL_COMMUNITY): Payer: Self-pay | Admitting: Internal Medicine

## 2014-10-07 ENCOUNTER — Other Ambulatory Visit (INDEPENDENT_AMBULATORY_CARE_PROVIDER_SITE_OTHER): Payer: Self-pay | Admitting: Internal Medicine

## 2014-10-07 DIAGNOSIS — K562 Volvulus: Secondary | ICD-10-CM

## 2015-04-28 ENCOUNTER — Ambulatory Visit
Admission: RE | Admit: 2015-04-28 | Discharge: 2015-04-28 | Disposition: A | Discharge status: Home / Self Care | Payer: Medicare Other | Source: Ambulatory Visit | Attending: Internal Medicine | Admitting: Internal Medicine

## 2015-04-28 DIAGNOSIS — K562 Volvulus: Secondary | ICD-10-CM

## 2015-08-01 ENCOUNTER — Encounter (INDEPENDENT_AMBULATORY_CARE_PROVIDER_SITE_OTHER): Payer: Self-pay

## 2015-09-29 ENCOUNTER — Other Ambulatory Visit (HOSPITAL_COMMUNITY): Payer: Self-pay | Admitting: Internal Medicine

## 2015-09-29 DIAGNOSIS — Z1231 Encounter for screening mammogram for malignant neoplasm of breast: Secondary | ICD-10-CM

## 2015-10-05 ENCOUNTER — Ambulatory Visit (HOSPITAL_COMMUNITY)
Admission: RE | Admit: 2015-10-05 | Discharge: 2015-10-05 | Disposition: A | Discharge status: Home / Self Care | Payer: Medicare Other | Source: Ambulatory Visit | Attending: Internal Medicine | Admitting: Internal Medicine

## 2015-10-05 ENCOUNTER — Ambulatory Visit (HOSPITAL_COMMUNITY): Payer: Medicare Other

## 2015-10-05 DIAGNOSIS — Z1231 Encounter for screening mammogram for malignant neoplasm of breast: Secondary | ICD-10-CM | POA: Insufficient documentation

## 2017-02-01 ENCOUNTER — Other Ambulatory Visit (HOSPITAL_COMMUNITY): Payer: Self-pay | Admitting: Internal Medicine

## 2017-02-01 DIAGNOSIS — Z1231 Encounter for screening mammogram for malignant neoplasm of breast: Secondary | ICD-10-CM

## 2017-02-28 ENCOUNTER — Ambulatory Visit (HOSPITAL_COMMUNITY)
Admission: RE | Admit: 2017-02-28 | Discharge: 2017-02-28 | Disposition: A | Discharge status: Home / Self Care | Payer: Medicare Other | Source: Ambulatory Visit | Attending: Internal Medicine | Admitting: Internal Medicine

## 2017-02-28 DIAGNOSIS — Z1231 Encounter for screening mammogram for malignant neoplasm of breast: Secondary | ICD-10-CM | POA: Insufficient documentation

## 2019-04-24 DIAGNOSIS — Z6828 Body mass index (BMI) 28.0-28.9, adult: Secondary | ICD-10-CM | POA: Diagnosis not present

## 2019-04-24 DIAGNOSIS — E663 Overweight: Secondary | ICD-10-CM | POA: Diagnosis not present

## 2019-04-24 DIAGNOSIS — I10 Essential (primary) hypertension: Secondary | ICD-10-CM | POA: Diagnosis not present

## 2019-04-24 DIAGNOSIS — E538 Deficiency of other specified B group vitamins: Secondary | ICD-10-CM | POA: Diagnosis not present

## 2019-04-24 DIAGNOSIS — G894 Chronic pain syndrome: Secondary | ICD-10-CM | POA: Diagnosis not present

## 2019-04-24 DIAGNOSIS — E119 Type 2 diabetes mellitus without complications: Secondary | ICD-10-CM | POA: Diagnosis not present

## 2019-04-24 DIAGNOSIS — E7849 Other hyperlipidemia: Secondary | ICD-10-CM | POA: Diagnosis not present

## 2019-04-24 DIAGNOSIS — E559 Vitamin D deficiency, unspecified: Secondary | ICD-10-CM | POA: Diagnosis not present

## 2019-04-24 DIAGNOSIS — F419 Anxiety disorder, unspecified: Secondary | ICD-10-CM | POA: Diagnosis not present

## 2019-04-24 DIAGNOSIS — M1991 Primary osteoarthritis, unspecified site: Secondary | ICD-10-CM | POA: Diagnosis not present

## 2019-04-24 DIAGNOSIS — Z1389 Encounter for screening for other disorder: Secondary | ICD-10-CM | POA: Diagnosis not present

## 2019-04-24 DIAGNOSIS — Z0001 Encounter for general adult medical examination with abnormal findings: Secondary | ICD-10-CM | POA: Diagnosis not present

## 2019-05-27 DIAGNOSIS — R498 Other voice and resonance disorders: Secondary | ICD-10-CM | POA: Diagnosis not present

## 2019-05-27 DIAGNOSIS — J22 Unspecified acute lower respiratory infection: Secondary | ICD-10-CM | POA: Diagnosis not present

## 2019-05-27 DIAGNOSIS — Z6828 Body mass index (BMI) 28.0-28.9, adult: Secondary | ICD-10-CM | POA: Diagnosis not present

## 2019-05-27 DIAGNOSIS — E663 Overweight: Secondary | ICD-10-CM | POA: Diagnosis not present

## 2019-06-26 DIAGNOSIS — Z794 Long term (current) use of insulin: Secondary | ICD-10-CM | POA: Diagnosis not present

## 2019-06-26 DIAGNOSIS — Z961 Presence of intraocular lens: Secondary | ICD-10-CM | POA: Diagnosis not present

## 2019-06-26 DIAGNOSIS — H524 Presbyopia: Secondary | ICD-10-CM | POA: Diagnosis not present

## 2019-06-26 DIAGNOSIS — E119 Type 2 diabetes mellitus without complications: Secondary | ICD-10-CM | POA: Diagnosis not present

## 2019-11-13 ENCOUNTER — Ambulatory Visit
Admission: EM | Admit: 2019-11-13 | Discharge: 2019-11-13 | Disposition: A | Discharge status: Home / Self Care | Payer: Medicare PPO | Attending: Emergency Medicine | Admitting: Emergency Medicine

## 2019-11-13 ENCOUNTER — Ambulatory Visit (INDEPENDENT_AMBULATORY_CARE_PROVIDER_SITE_OTHER): Payer: Medicare PPO

## 2019-11-13 ENCOUNTER — Other Ambulatory Visit: Payer: Self-pay

## 2019-11-13 DIAGNOSIS — W19XXXA Unspecified fall, initial encounter: Secondary | ICD-10-CM

## 2019-11-13 DIAGNOSIS — R0781 Pleurodynia: Secondary | ICD-10-CM

## 2019-11-13 DIAGNOSIS — R079 Chest pain, unspecified: Secondary | ICD-10-CM

## 2019-11-13 DIAGNOSIS — S2242XA Multiple fractures of ribs, left side, initial encounter for closed fracture: Secondary | ICD-10-CM

## 2019-11-13 MED ORDER — CYCLOBENZAPRINE HCL 5 MG PO TABS: 5.0000 mg | ORAL_TABLET | Freq: Every day | ORAL | 0 refills | Status: DC

## 2019-11-13 NOTE — ED Triage Notes (Signed)
Pt fell from step on left side, has pain under left breast rib area

## 2019-11-13 NOTE — Discharge Instructions (Addendum)
Please schedule Tylenol at 500 mg - 650 mg once every 6 hours as needed for aches and pains.  If you still have pain despite taking Tylenol regularly, this is breakthrough pain.  You can use tramadol once every 6 hours for this.  Once your pain is better controlled, switch back to just Tylenol.  

## 2019-11-20 DIAGNOSIS — Z6829 Body mass index (BMI) 29.0-29.9, adult: Secondary | ICD-10-CM | POA: Diagnosis not present

## 2019-11-20 DIAGNOSIS — M1991 Primary osteoarthritis, unspecified site: Secondary | ICD-10-CM | POA: Diagnosis not present

## 2019-11-20 DIAGNOSIS — E1165 Type 2 diabetes mellitus with hyperglycemia: Secondary | ICD-10-CM | POA: Diagnosis not present

## 2019-11-20 DIAGNOSIS — I10 Essential (primary) hypertension: Secondary | ICD-10-CM | POA: Diagnosis not present

## 2020-03-14 ENCOUNTER — Ambulatory Visit
Admission: EM | Admit: 2020-03-14 | Discharge: 2020-03-14 | Disposition: A | Discharge status: Home / Self Care | Payer: Medicare PPO | Attending: Emergency Medicine | Admitting: Emergency Medicine

## 2020-03-14 ENCOUNTER — Other Ambulatory Visit: Payer: Self-pay

## 2020-03-14 ENCOUNTER — Encounter: Payer: Self-pay | Admitting: Emergency Medicine

## 2020-03-14 DIAGNOSIS — J0100 Acute maxillary sinusitis, unspecified: Secondary | ICD-10-CM | POA: Diagnosis not present

## 2020-03-14 DIAGNOSIS — R059 Cough, unspecified: Secondary | ICD-10-CM

## 2020-03-14 MED ORDER — BENZONATATE 100 MG PO CAPS: 100.0000 mg | ORAL_CAPSULE | Freq: Three times a day (TID) | ORAL | 0 refills | Status: DC | PRN

## 2020-03-14 MED ORDER — AMOXICILLIN-POT CLAVULANATE 875-125 MG PO TABS: 1.0000 | ORAL_TABLET | Freq: Two times a day (BID) | ORAL | 0 refills | Status: DC

## 2020-03-14 NOTE — ED Triage Notes (Signed)
Sneezing and productive coughing with yellowish sputum.

## 2020-03-14 NOTE — Discharge Instructions (Signed)
Get plenty of rest and push fluids Tessalon Perles prescribed for cough Augmentin was prescribed  Continue Benadryl and Flonase as  directed  Continue Mucinex Use medications daily for symptom relief Use OTC medications like ibuprofen or tylenol as needed fever or pain Call or go to the ED if you have any new or worsening symptoms such as fever, worsening cough, shortness of breath, chest tightness, chest pain, turning blue, changes in mental status, etc..Marland Kitchen

## 2020-03-14 NOTE — ED Provider Notes (Signed)
[place]   [id number] 03/14/20 Arrival Time: 9485   CC: URI  SUBJECTIVE: History from: patient.  Erin Hoffman is a 75 y.o. female who presents to the urgent care with a complaint of sneezing, sinus pressure and pain, cough review yellowish sputum for the past 2 weeks .  Denies sick exposure to COVID, flu or strep.  Denies recent travel.  Has tried OTC medication without relief.  Denies alleviating or aggravating factors.  Denies previous symptoms in the past.   Denies fever, chills, fatigue, sinus pain, rhinorrhea, sore throat, SOB, wheezing, chest pain, nausea, changes in bowel or bladder habits.    ROS: As per HPI.  All other pertinent ROS negative.     Past Medical History:  Diagnosis Date  . Anemia   . Anxiety   . Diabetes mellitus   . H/O hiatal hernia   . Hypertension   . Hypothyroidism    Past Surgical History:  Procedure Laterality Date  . ABDOMINAL HYSTERECTOMY    . CATARACT EXTRACTION W/PHACO Left 06/16/2013   Procedure: CATARACT EXTRACTION PHACO AND INTRAOCULAR LENS PLACEMENT (IOC);  Surgeon: Elta Guadeloupe T. Gershon Crane, MD;  Location: [facility];  Service: Ophthalmology;  Laterality: Left;  CDE:4.65  . COLONOSCOPY N/A 10/01/2014   Procedure: COLONOSCOPY;  Surgeon: Rogene Houston, MD;  Location: [facility];  Service: Endoscopy;  Laterality: N/A;  1125  . TONSILLECTOMY    . TUBAL LIGATION     No Known Allergies No current facility-administered medications on file prior to encounter.   Current Outpatient Medications on File Prior to Encounter  Medication Sig Dispense Refill  . ALPRAZolam (XANAX) 0.5 MG tablet Take 0.5 mg by mouth at bedtime as needed for anxiety.    . cyanocobalamin (,VITAMIN B-12,) 1000 MCG/ML injection Inject 1,000 mcg into the muscle every 30 (thirty) days.    . cyclobenzaprine (FLEXERIL) 5 MG tablet Take 1 tablet (5 mg total) by mouth at bedtime. 60 tablet 0  . enalapril (VASOTEC) 2.5 MG tablet Take 2.5 mg by mouth daily.    . fenofibrate  micronized (LOFIBRA) 134 MG capsule Take 134 mg by mouth daily before breakfast.    . fexofenadine (ALLEGRA) 180 MG tablet Take 180 mg by mouth daily as needed for allergies or rhinitis.    Marland Kitchen glimepiride (AMARYL) 4 MG tablet Take 4 mg by mouth 2 (two) times daily.    . insulin glargine (LANTUS) 100 UNIT/ML injection Inject 40 Units into the skin at bedtime.    Marland Kitchen levothyroxine (SYNTHROID, LEVOTHROID) 137 MCG tablet Take 137 mcg by mouth daily before breakfast.    . metFORMIN (GLUCOPHAGE) 500 MG tablet Take 1,000 mg by mouth 2 (two) times daily with a meal.    . Omega-3 Fatty Acids (FISH OIL) 1200 MG CAPS Take 1 capsule by mouth daily.      Social History   Socioeconomic History  . Marital status: Divorced    Spouse name: Not on file  . Number of children: Not on file  . Years of education: Not on file  . Highest education level: Not on file  Occupational History  . Not on file  Tobacco Use  . Smoking status: Former Smoker    Packs/day: 1.00    Years: 30.00    Pack years: 30.00    Types: Cigarettes    Quit date: 06/10/1988    Years since quitting: 31.7  . Smokeless tobacco: Never Used  Substance and Sexual Activity  . Alcohol use: No  .  Drug use: No  . Sexual activity: Yes    Birth control/protection: Surgical  Other Topics Concern  . Not on file  Social History Narrative  . Not on file   Social Determinants of Health   Financial Resource Strain: Not on file  Food Insecurity: Not on file  Transportation Needs: Not on file  Physical Activity: Not on file  Stress: Not on file  Social Connections: Not on file  Intimate Partner Violence: Not on file   Family History  Problem Relation Age of Onset  . Brain cancer Father   . Crohn's disease Sister   . Coronary artery disease Sister   . Multiple myeloma Brother   . Diabetes Brother   . Hypertension Brother   . Rheum arthritis Mother     OBJECTIVE:  Vitals:   03/14/20 1011  BP: (!) 176/78  Pulse: 77  Resp: 18   Temp: 98.5 F (36.9 C)  TempSrc: Oral  SpO2: 98%     General appearance: alert; appears fatigued, but nontoxic; speaking in full sentences and tolerating own secretions HEENT: NCAT; Ears: EACs clear, TMs pearly gray; Eyes: PERRL.  EOM grossly intact. Sinuses: nontender; Nose: nares patent without rhinorrhea, Throat: oropharynx clear, tonsils non erythematous or enlarged, uvula midline  Neck: supple without LAD Lungs: unlabored respirations, symmetrical air entry; cough: moderate; no respiratory distress; CTAB Heart: regular rate and rhythm.  Radial pulses 2+ symmetrical bilaterally Skin: warm and dry Psychological: alert and cooperative; normal mood and affect  LABS:  No results found for this or any previous visit (from the past 24 hour(s)).   ASSESSMENT & PLAN:  1. Acute non-recurrent maxillary sinusitis   2. Cough     Meds ordered this encounter  Medications  . amoxicillin-clavulanate (AUGMENTIN) 875-125 MG tablet    Sig: Take 1 tablet by mouth every 12 (twelve) hours.    Dispense:  14 tablet    Refill:  0  . benzonatate (TESSALON) 100 MG capsule    Sig: Take 1 capsule (100 mg total) by mouth 3 (three) times daily as needed for cough.    Dispense:  30 capsule    Refill:  0    Discharge Instructions     Get plenty of rest and push fluids Tessalon Perles prescribed for cough Augmentin was prescribed  Continue Benadryl and Flonase as  directed  Continue Mucinex Use medications daily for symptom relief Use OTC medications like ibuprofen or tylenol as needed fever or pain Call or go to the ED if you have any new or worsening symptoms such as fever, worsening cough, shortness of breath, chest tightness, chest pain, turning blue, changes in mental status, etc...   Reviewed expectations re: course of current medical issues. Questions answered. Outlined signs and symptoms indicating need for more acute intervention. Patient verbalized understanding. After Visit Summary  given.         Emerson Monte, FNP 03/14/20 1102

## 2020-05-06 DIAGNOSIS — M150 Primary generalized (osteo)arthritis: Secondary | ICD-10-CM | POA: Diagnosis not present

## 2020-05-06 DIAGNOSIS — E119 Type 2 diabetes mellitus without complications: Secondary | ICD-10-CM | POA: Diagnosis not present

## 2020-05-06 DIAGNOSIS — I10 Essential (primary) hypertension: Secondary | ICD-10-CM | POA: Diagnosis not present

## 2020-05-06 DIAGNOSIS — D510 Vitamin B12 deficiency anemia due to intrinsic factor deficiency: Secondary | ICD-10-CM | POA: Diagnosis not present

## 2020-05-06 DIAGNOSIS — Z1389 Encounter for screening for other disorder: Secondary | ICD-10-CM | POA: Diagnosis not present

## 2020-05-17 DIAGNOSIS — U071 COVID-19: Secondary | ICD-10-CM | POA: Diagnosis not present

## 2020-05-26 DIAGNOSIS — E063 Autoimmune thyroiditis: Secondary | ICD-10-CM | POA: Diagnosis not present

## 2020-05-26 DIAGNOSIS — E119 Type 2 diabetes mellitus without complications: Secondary | ICD-10-CM | POA: Diagnosis not present

## 2020-05-26 DIAGNOSIS — J329 Chronic sinusitis, unspecified: Secondary | ICD-10-CM | POA: Diagnosis not present

## 2020-07-06 DIAGNOSIS — Z794 Long term (current) use of insulin: Secondary | ICD-10-CM | POA: Diagnosis not present

## 2020-07-06 DIAGNOSIS — Z961 Presence of intraocular lens: Secondary | ICD-10-CM | POA: Diagnosis not present

## 2020-07-06 DIAGNOSIS — H524 Presbyopia: Secondary | ICD-10-CM | POA: Diagnosis not present

## 2020-07-06 DIAGNOSIS — E119 Type 2 diabetes mellitus without complications: Secondary | ICD-10-CM | POA: Diagnosis not present

## 2020-09-13 DIAGNOSIS — I10 Essential (primary) hypertension: Secondary | ICD-10-CM | POA: Diagnosis not present

## 2020-09-13 DIAGNOSIS — M1991 Primary osteoarthritis, unspecified site: Secondary | ICD-10-CM | POA: Diagnosis not present

## 2020-09-13 DIAGNOSIS — J209 Acute bronchitis, unspecified: Secondary | ICD-10-CM | POA: Diagnosis not present

## 2020-09-13 DIAGNOSIS — J329 Chronic sinusitis, unspecified: Secondary | ICD-10-CM | POA: Diagnosis not present

## 2020-09-14 DIAGNOSIS — J329 Chronic sinusitis, unspecified: Secondary | ICD-10-CM | POA: Diagnosis not present

## 2020-10-07 DIAGNOSIS — F419 Anxiety disorder, unspecified: Secondary | ICD-10-CM | POA: Diagnosis not present

## 2020-10-07 DIAGNOSIS — G894 Chronic pain syndrome: Secondary | ICD-10-CM | POA: Diagnosis not present

## 2020-10-07 DIAGNOSIS — I10 Essential (primary) hypertension: Secondary | ICD-10-CM | POA: Diagnosis not present

## 2020-10-07 DIAGNOSIS — M150 Primary generalized (osteo)arthritis: Secondary | ICD-10-CM | POA: Diagnosis not present

## 2020-10-07 DIAGNOSIS — E1165 Type 2 diabetes mellitus with hyperglycemia: Secondary | ICD-10-CM | POA: Diagnosis not present

## 2020-10-07 DIAGNOSIS — D510 Vitamin B12 deficiency anemia due to intrinsic factor deficiency: Secondary | ICD-10-CM | POA: Diagnosis not present

## 2020-10-07 DIAGNOSIS — E063 Autoimmune thyroiditis: Secondary | ICD-10-CM | POA: Diagnosis not present

## 2020-12-02 ENCOUNTER — Ambulatory Visit
Admission: EM | Admit: 2020-12-02 | Discharge: 2020-12-02 | Disposition: A | Discharge status: Home / Self Care | Payer: Medicare PPO | Attending: Urgent Care | Admitting: Urgent Care

## 2020-12-02 ENCOUNTER — Ambulatory Visit (INDEPENDENT_AMBULATORY_CARE_PROVIDER_SITE_OTHER): Payer: Medicare PPO

## 2020-12-02 ENCOUNTER — Other Ambulatory Visit: Payer: Self-pay

## 2020-12-02 DIAGNOSIS — R051 Acute cough: Secondary | ICD-10-CM

## 2020-12-02 DIAGNOSIS — R059 Cough, unspecified: Secondary | ICD-10-CM | POA: Diagnosis not present

## 2020-12-02 DIAGNOSIS — J3489 Other specified disorders of nose and nasal sinuses: Secondary | ICD-10-CM

## 2020-12-02 DIAGNOSIS — R0981 Nasal congestion: Secondary | ICD-10-CM

## 2020-12-02 DIAGNOSIS — J018 Other acute sinusitis: Secondary | ICD-10-CM | POA: Diagnosis not present

## 2020-12-02 MED ORDER — IPRATROPIUM BROMIDE 0.03 % NA SOLN: 2.0000 | Freq: Two times a day (BID) | NASAL | 0 refills | Status: DC

## 2020-12-02 MED ORDER — AMOXICILLIN-POT CLAVULANATE 875-125 MG PO TABS: 1.0000 | ORAL_TABLET | Freq: Two times a day (BID) | ORAL | 0 refills | Status: DC

## 2020-12-02 MED ORDER — CETIRIZINE HCL 5 MG PO TABS: 5.0000 mg | ORAL_TABLET | Freq: Every day | ORAL | 0 refills | Status: DC

## 2020-12-02 MED ORDER — PROMETHAZINE-DM 6.25-15 MG/5ML PO SYRP: 5.0000 mL | ORAL_SOLUTION | Freq: Every evening | ORAL | 0 refills | Status: DC | PRN

## 2020-12-02 NOTE — ED Triage Notes (Signed)
Patient states she has a cough that will not go away. It started out with clear mucus and now it is yellow. This started 2 weeks ago and just progressing. She does not feel like it is the Flu and Covid test was negative. She states she took Robitussin and Catering manager and nothing helping the cough.    Denies Fever

## 2020-12-02 NOTE — ED Provider Notes (Signed)
[facility]   MRN: [id number] DOB: February 03, 1945  Subjective:   Erin Hoffman is a 75 y.o. female presenting for 2 week history of persistent productive cough, sinus congestion, bloody mucus from her nose. She has gotten progressively worse over the course of the last 2 weeks, is feeling like she is wheezing.  No shortness of breath.  No history of asthma.  No smoking.  Patient is a type II diabetic, treated with insulin.  Her blood sugars are less than 150 fasting.  No current facility-administered medications for this encounter.  Current Outpatient Medications:    ALPRAZolam (XANAX) 0.5 MG tablet, Take 0.5 mg by mouth at bedtime as needed for anxiety., Disp: , Rfl:    amoxicillin-clavulanate (AUGMENTIN) 875-125 MG tablet, Take 1 tablet by mouth every 12 (twelve) hours., Disp: 14 tablet, Rfl: 0   benzonatate (TESSALON) 100 MG capsule, Take 1 capsule (100 mg total) by mouth 3 (three) times daily as needed for cough., Disp: 30 capsule, Rfl: 0   cyanocobalamin (,VITAMIN B-12,) 1000 MCG/ML injection, Inject 1,000 mcg into the muscle every 30 (thirty) days., Disp: , Rfl:    cyclobenzaprine (FLEXERIL) 5 MG tablet, Take 1 tablet (5 mg total) by mouth at bedtime., Disp: 60 tablet, Rfl: 0   enalapril (VASOTEC) 2.5 MG tablet, Take 2.5 mg by mouth daily., Disp: , Rfl:    fenofibrate micronized (LOFIBRA) 134 MG capsule, Take 134 mg by mouth daily before breakfast., Disp: , Rfl:    fexofenadine (ALLEGRA) 180 MG tablet, Take 180 mg by mouth daily as needed for allergies or rhinitis., Disp: , Rfl:    glimepiride (AMARYL) 4 MG tablet, Take 4 mg by mouth 2 (two) times daily., Disp: , Rfl:    insulin glargine (LANTUS) 100 UNIT/ML injection, Inject 40 Units into the skin at bedtime., Disp: , Rfl:    levothyroxine (SYNTHROID, LEVOTHROID) 137 MCG tablet, Take 137 mcg by mouth daily before breakfast., Disp: , Rfl:    metFORMIN (GLUCOPHAGE) 500 MG tablet, Take 1,000 mg by mouth 2 (two) times  daily with a meal., Disp: , Rfl:    Omega-3 Fatty Acids (FISH OIL) 1200 MG CAPS, Take 1 capsule by mouth daily. , Disp: , Rfl:    No Known Allergies  Past Medical History:  Diagnosis Date   Anemia    Anxiety    Diabetes mellitus    H/O hiatal hernia    Hypertension    Hypothyroidism      Past Surgical History:  Procedure Laterality Date   ABDOMINAL HYSTERECTOMY     CATARACT EXTRACTION W/PHACO Left 06/16/2013   Procedure: CATARACT EXTRACTION PHACO AND INTRAOCULAR LENS PLACEMENT (IOC);  Surgeon: [name] T. Nile Riggs, MD;  Location: [facility];  Service: Ophthalmology;  Laterality: Left;  CDE:4.65   COLONOSCOPY N/A 10/01/2014   Procedure: COLONOSCOPY;  Surgeon: Malissa Hippo, MD;  Location: [facility];  Service: Endoscopy;  Laterality: N/A;  1125   TONSILLECTOMY     TUBAL LIGATION      Family History  Problem Relation Age of Onset   Brain cancer Father    Crohn's disease Sister    Coronary artery disease Sister    Multiple myeloma Brother    Diabetes Brother    Hypertension Brother    Rheum arthritis Mother     Social History   Tobacco Use   Smoking status: Former    Packs/day: 1.00    Years: 30.00    Pack years: 30.00    Types: Cigarettes  Quit date: 06/10/1988    Years since quitting: 32.5   Smokeless tobacco: Never  Substance Use Topics   Alcohol use: No   Drug use: No    ROS   Objective:   Vitals: BP (!) 173/92 (BP Location: Right Arm)   Pulse 78   Temp 98.6 F (37 C) (Oral)   Resp 18   SpO2 96%   Physical Exam Constitutional:      General: She is not in acute distress.    Appearance: Normal appearance. She is well-developed and normal weight. She is not ill-appearing, toxic-appearing or diaphoretic.  HENT:     Head: Normocephalic and atraumatic.     Right Ear: External ear normal.     Left Ear: External ear normal.     Nose: Nose normal.     Mouth/Throat:     Mouth: Mucous membranes are moist.  Eyes:     General: No scleral icterus.        Right eye: No discharge.        Left eye: No discharge.     Extraocular Movements: Extraocular movements intact.     Conjunctiva/sclera: Conjunctivae normal.     Pupils: Pupils are equal, round, and reactive to light.  Cardiovascular:     Rate and Rhythm: Normal rate and regular rhythm.     Pulses: Normal pulses.     Heart sounds: Normal heart sounds. No murmur heard.   No friction rub. No gallop.  Pulmonary:     Effort: Pulmonary effort is normal. No respiratory distress.     Breath sounds: Normal breath sounds. No stridor. No wheezing, rhonchi or rales.  Skin:    General: Skin is warm and dry.     Findings: No rash.  Neurological:     Mental Status: She is alert and oriented to person, place, and time.  Psychiatric:        Mood and Affect: Mood normal.        Behavior: Behavior normal.        Thought Content: Thought content normal.    DG Chest 2 View  Result Date: 12/02/2020 CLINICAL DATA:  Productive cough for over 2 weeks. EXAM: CHEST - 2 VIEW COMPARISON:  11/13/2019 FINDINGS: The heart size and mediastinal contours are within normal limits. Both lungs are clear. Several old left rib fracture deformities are noted. IMPRESSION: No active cardiopulmonary disease. Electronically Signed   By: Danae Orleans M.D.   On: 12/02/2020 18:30     Assessment and Plan :   PDMP not reviewed this encounter.  1. Acute non-recurrent sinusitis of other sinus   2. Sinus congestion   3. Sinus pain   4. Acute cough    Will start empiric treatment for sinusitis with Augmentin.  Recommended supportive care otherwise including the use of oral antihistamine, nasal spray. Counseled patient on potential for adverse effects with medications prescribed/recommended today, ER and return-to-clinic precautions discussed, patient verbalized understanding.     Wallis Bamberg, [place] 12/02/20 3086

## 2020-12-23 ENCOUNTER — Ambulatory Visit
Admission: EM | Admit: 2020-12-23 | Discharge: 2020-12-23 | Disposition: A | Discharge status: Home / Self Care | Payer: Medicare PPO | Attending: Family Medicine | Admitting: Family Medicine

## 2020-12-23 ENCOUNTER — Other Ambulatory Visit: Payer: Self-pay

## 2020-12-23 DIAGNOSIS — J3089 Other allergic rhinitis: Secondary | ICD-10-CM | POA: Diagnosis not present

## 2020-12-23 DIAGNOSIS — J40 Bronchitis, not specified as acute or chronic: Secondary | ICD-10-CM | POA: Diagnosis not present

## 2020-12-23 DIAGNOSIS — Z20828 Contact with and (suspected) exposure to other viral communicable diseases: Secondary | ICD-10-CM

## 2020-12-23 MED ORDER — IPRATROPIUM BROMIDE 0.03 % NA SOLN: 2.0000 | Freq: Two times a day (BID) | NASAL | 2 refills | Status: DC

## 2020-12-23 MED ORDER — DEXAMETHASONE SODIUM PHOSPHATE 10 MG/ML IJ SOLN
10.0000 mg | Freq: Once | INTRAMUSCULAR | Status: AC
  Administered 2020-12-23: 10 mg via INTRAMUSCULAR

## 2020-12-23 MED ORDER — CETIRIZINE HCL 5 MG PO TABS: 5.0000 mg | ORAL_TABLET | Freq: Every day | ORAL | 2 refills | Status: DC

## 2020-12-23 MED ORDER — ALBUTEROL SULFATE HFA 108 (90 BASE) MCG/ACT IN AERS: 2.0000 | INHALATION_SPRAY | Freq: Four times a day (QID) | RESPIRATORY_TRACT | 0 refills | Status: DC | PRN

## 2020-12-23 NOTE — ED Triage Notes (Signed)
Patient states she has a lot of mucus when she coughs.  Coughing started back last week.  She states she finished all the medication that she was prescribed on 12/02/2020.   Denies Fever

## 2020-12-23 NOTE — ED Provider Notes (Signed)
[facility]    CSN: [id number] Arrival date & time: 12/23/20  8338      History   Chief Complaint No chief complaint on file.   HPI KETTY BITTON is a 75 y.o. female.   Presenting today with ongoing cough, congestion x3 weeks now.  Was seen on 12/02/2020 and given Augmentin, allergy regimen, cough syrup which seemed to help temporarily but symptoms of congestion and cough have been ongoing.  Denies fever, chills, body aches, chest pain, shortness of breath, abdominal pain, nausea vomiting diarrhea, new sick contacts.  Still taking her allergy medications but is about to run out.  Is a former smoker but no known diagnosed pulmonary disease.  Past Medical History:  Diagnosis Date   Anemia    Anxiety    Diabetes mellitus    H/O hiatal hernia    Hypertension    Hypothyroidism     There are no problems to display for this patient.   Past Surgical History:  Procedure Laterality Date   ABDOMINAL HYSTERECTOMY     CATARACT EXTRACTION W/PHACO Left 06/16/2013   Procedure: CATARACT EXTRACTION PHACO AND INTRAOCULAR LENS PLACEMENT (IOC);  Surgeon: Elta Guadeloupe T. Gershon Crane, MD;  Location: [facility];  Service: Ophthalmology;  Laterality: Left;  CDE:4.65   COLONOSCOPY N/A 10/01/2014   Procedure: COLONOSCOPY;  Surgeon: Rogene Houston, MD;  Location: [facility];  Service: Endoscopy;  Laterality: N/A;  1125   TONSILLECTOMY     TUBAL LIGATION      OB History   No obstetric history on file.      Home Medications    Prior to Admission medications   Medication Sig Start Date End Date Taking? Authorizing Provider  albuterol (VENTOLIN HFA) 108 (90 Base) MCG/ACT inhaler Inhale 2 puffs into the lungs every 6 (six) hours as needed for wheezing or shortness of breath. 12/23/20  Yes Volney American, PA-C  ALPRAZolam Duanne Moron) 0.5 MG tablet Take 0.5 mg by mouth at bedtime as needed for anxiety.    [provider]  amoxicillin-clavulanate (AUGMENTIN) 875-125 MG tablet Take  1 tablet by mouth 2 (two) times daily. 12/02/20   Jaynee Eagles, PA-C  cetirizine (ZYRTEC) 5 MG tablet Take 1 tablet (5 mg total) by mouth daily. 12/23/20   Volney American, PA-C  cyanocobalamin (,VITAMIN B-12,) 1000 MCG/ML injection Inject 1,000 mcg into the muscle every 30 (thirty) days.    [provider]  cyclobenzaprine (FLEXERIL) 5 MG tablet Take 1 tablet (5 mg total) by mouth at bedtime. 11/13/19   Jaynee Eagles, PA-C  enalapril (VASOTEC) 2.5 MG tablet Take 2.5 mg by mouth daily.    [provider]  fenofibrate micronized (LOFIBRA) 134 MG capsule Take 134 mg by mouth daily before breakfast.    [provider]  fexofenadine (ALLEGRA) 180 MG tablet Take 180 mg by mouth daily as needed for allergies or rhinitis.    [provider]  glimepiride (AMARYL) 4 MG tablet Take 4 mg by mouth 2 (two) times daily.    [provider]  insulin glargine (LANTUS) 100 UNIT/ML injection Inject 40 Units into the skin at bedtime.    [provider]  ipratropium (ATROVENT) 0.03 % nasal spray Place 2 sprays into both nostrils 2 (two) times daily. 12/23/20   Volney American, PA-C  levothyroxine (SYNTHROID, LEVOTHROID) 137 MCG tablet Take 137 mcg by mouth daily before breakfast.    [provider]  metFORMIN (GLUCOPHAGE) 500 MG tablet Take 1,000 mg by mouth  2 (two) times daily with a meal.    [provider]  Omega-3 Fatty Acids (FISH OIL) 1200 MG CAPS Take 1 capsule by mouth daily.     [provider]  promethazine-dextromethorphan (PROMETHAZINE-DM) 6.25-15 MG/5ML syrup Take 5 mLs by mouth at bedtime as needed for cough. 12/02/20   Wallis Bamberg, PA-C    Family History Family History  Problem Relation Age of Onset   Brain cancer Father    Crohn's disease Sister    Coronary artery disease Sister    Multiple myeloma Brother    Diabetes Brother    Hypertension Brother    Rheum arthritis Mother     Social History Social  History   Tobacco Use   Smoking status: Former    Packs/day: 1.00    Years: 30.00    Pack years: 30.00    Types: Cigarettes    Quit date: 06/10/1988    Years since quitting: 32.5   Smokeless tobacco: Never  Substance Use Topics   Alcohol use: No   Drug use: No     Allergies   Patient has no known allergies.   Review of Systems Review of Systems Per HPI  Physical Exam Triage Vital Signs ED Triage Vitals  Enc Vitals Group     BP 12/23/20 0919 (!) 157/79     Pulse Rate 12/23/20 0919 88     Resp 12/23/20 0919 16     Temp 12/23/20 0919 98.4 F (36.9 C)     Temp Source 12/23/20 0919 Oral     SpO2 12/23/20 0919 97 %     Weight --      Height --      Head Circumference --      Peak Flow --      Pain Score 12/23/20 0917 6     Pain Loc --      Pain Edu? --      Excl. in GC? --    No data found.  Updated Vital Signs BP (!) 157/79 (BP Location: Right Arm)    Pulse 88    Temp 98.4 F (36.9 C) (Oral)    Resp 16    SpO2 97%   Visual Acuity Right Eye Distance:   Left Eye Distance:   Bilateral Distance:    Right Eye Near:   Left Eye Near:    Bilateral Near:     Physical Exam Vitals and nursing note reviewed.  Constitutional:      Appearance: Normal appearance.  HENT:     Head: Atraumatic.     Right Ear: Tympanic membrane and external ear normal.     Left Ear: Tympanic membrane and external ear normal.     Nose: Rhinorrhea present.     Mouth/Throat:     Mouth: Mucous membranes are moist.     Pharynx: Posterior oropharyngeal erythema present.  Eyes:     Extraocular Movements: Extraocular movements intact.     Conjunctiva/sclera: Conjunctivae normal.  Cardiovascular:     Rate and Rhythm: Normal rate and regular rhythm.     Heart sounds: Normal heart sounds.  Pulmonary:     Effort: Pulmonary effort is normal.     Breath sounds: Wheezing present. No rales.     Comments: Minimal scattered wheezes Musculoskeletal:        General: Normal range of motion.      Cervical back: Normal range of motion and neck supple.  Skin:    General: Skin is warm and dry.  Neurological:     Mental Status: She is alert and oriented to person, place, and time.  Psychiatric:        Mood and Affect: Mood normal.        Thought Content: Thought content normal.   UC Treatments / Results  Labs (all labs ordered are listed, but only abnormal results are displayed) Labs Reviewed  COVID-19, FLU A+B NAA   EKG  Radiology No results found.  Procedures Procedures (including critical care time)  Medications Ordered in UC Medications  dexamethasone (DECADRON) injection 10 mg (10 mg Intramuscular Given 12/23/20 0955)    Initial Impression / Assessment and Plan / UC Course  I have reviewed the triage vital signs and the nursing notes.  Pertinent labs & imaging results that were available during my care of the patient were reviewed by me and considered in my medical decision making (see chart for details).     Vital signs overall reassuring today, suspect ongoing inflammatory/allergic symptoms.  We will cautiously give IM Decadron today per her preference over prednisone pills and monitor her blood sugars closely which she states have been under very good control in the 130s to 150s range on current regimen.  Albuterol inhaler and allergy medications sent to the pharmacy, discussed supportive over-the-counter medications and home care.  Return for acutely worsening symptoms.  Final Clinical Impressions(s) / UC Diagnoses   Final diagnoses:  Bronchitis  Seasonal allergic rhinitis due to other allergic trigger   Discharge Instructions   None    ED Prescriptions     Medication Sig Dispense Auth. Provider   albuterol (VENTOLIN HFA) 108 (90 Base) MCG/ACT inhaler Inhale 2 puffs into the lungs every 6 (six) hours as needed for wheezing or shortness of breath. 18 g Volney American, PA-C   ipratropium (ATROVENT) 0.03 % nasal spray Place 2 sprays into both  nostrils 2 (two) times daily. 30 mL Volney American, PA-C   cetirizine (ZYRTEC) 5 MG tablet Take 1 tablet (5 mg total) by mouth daily. 30 tablet Volney American, [place]      PDMP not reviewed this encounter.   Merrie Roof Burton, [place] 12/23/20 [phone]

## 2020-12-24 LAB — COVID-19, FLU A+B NAA
Influenza A, NAA: DETECTED — AB
Influenza B, NAA: NOT DETECTED
SARS-CoV-2, NAA: NOT DETECTED

## 2021-01-25 DIAGNOSIS — J329 Chronic sinusitis, unspecified: Secondary | ICD-10-CM | POA: Diagnosis not present

## 2021-02-08 DIAGNOSIS — J329 Chronic sinusitis, unspecified: Secondary | ICD-10-CM | POA: Diagnosis not present

## 2021-02-21 DIAGNOSIS — M5442 Lumbago with sciatica, left side: Secondary | ICD-10-CM | POA: Diagnosis not present

## 2021-02-21 DIAGNOSIS — M4722 Other spondylosis with radiculopathy, cervical region: Secondary | ICD-10-CM | POA: Diagnosis not present

## 2021-02-21 DIAGNOSIS — M9903 Segmental and somatic dysfunction of lumbar region: Secondary | ICD-10-CM | POA: Diagnosis not present

## 2021-02-21 DIAGNOSIS — M9901 Segmental and somatic dysfunction of cervical region: Secondary | ICD-10-CM | POA: Diagnosis not present

## 2021-02-27 DIAGNOSIS — M9903 Segmental and somatic dysfunction of lumbar region: Secondary | ICD-10-CM | POA: Diagnosis not present

## 2021-02-27 DIAGNOSIS — M5442 Lumbago with sciatica, left side: Secondary | ICD-10-CM | POA: Diagnosis not present

## 2021-02-27 DIAGNOSIS — M4722 Other spondylosis with radiculopathy, cervical region: Secondary | ICD-10-CM | POA: Diagnosis not present

## 2021-02-27 DIAGNOSIS — M9901 Segmental and somatic dysfunction of cervical region: Secondary | ICD-10-CM | POA: Diagnosis not present

## 2021-03-06 DIAGNOSIS — M9901 Segmental and somatic dysfunction of cervical region: Secondary | ICD-10-CM | POA: Diagnosis not present

## 2021-03-06 DIAGNOSIS — M5442 Lumbago with sciatica, left side: Secondary | ICD-10-CM | POA: Diagnosis not present

## 2021-03-06 DIAGNOSIS — M9903 Segmental and somatic dysfunction of lumbar region: Secondary | ICD-10-CM | POA: Diagnosis not present

## 2021-03-06 DIAGNOSIS — M4722 Other spondylosis with radiculopathy, cervical region: Secondary | ICD-10-CM | POA: Diagnosis not present

## 2021-03-13 DIAGNOSIS — M4722 Other spondylosis with radiculopathy, cervical region: Secondary | ICD-10-CM | POA: Diagnosis not present

## 2021-03-13 DIAGNOSIS — M9901 Segmental and somatic dysfunction of cervical region: Secondary | ICD-10-CM | POA: Diagnosis not present

## 2021-03-13 DIAGNOSIS — M5442 Lumbago with sciatica, left side: Secondary | ICD-10-CM | POA: Diagnosis not present

## 2021-03-13 DIAGNOSIS — M9903 Segmental and somatic dysfunction of lumbar region: Secondary | ICD-10-CM | POA: Diagnosis not present

## 2021-03-20 DIAGNOSIS — M5442 Lumbago with sciatica, left side: Secondary | ICD-10-CM | POA: Diagnosis not present

## 2021-03-20 DIAGNOSIS — M9901 Segmental and somatic dysfunction of cervical region: Secondary | ICD-10-CM | POA: Diagnosis not present

## 2021-03-20 DIAGNOSIS — M9903 Segmental and somatic dysfunction of lumbar region: Secondary | ICD-10-CM | POA: Diagnosis not present

## 2021-03-20 DIAGNOSIS — M4722 Other spondylosis with radiculopathy, cervical region: Secondary | ICD-10-CM | POA: Diagnosis not present

## 2021-03-27 DIAGNOSIS — M4722 Other spondylosis with radiculopathy, cervical region: Secondary | ICD-10-CM | POA: Diagnosis not present

## 2021-03-27 DIAGNOSIS — M5442 Lumbago with sciatica, left side: Secondary | ICD-10-CM | POA: Diagnosis not present

## 2021-03-27 DIAGNOSIS — M9901 Segmental and somatic dysfunction of cervical region: Secondary | ICD-10-CM | POA: Diagnosis not present

## 2021-03-27 DIAGNOSIS — M9903 Segmental and somatic dysfunction of lumbar region: Secondary | ICD-10-CM | POA: Diagnosis not present

## 2021-04-04 DIAGNOSIS — M5442 Lumbago with sciatica, left side: Secondary | ICD-10-CM | POA: Diagnosis not present

## 2021-04-04 DIAGNOSIS — M9903 Segmental and somatic dysfunction of lumbar region: Secondary | ICD-10-CM | POA: Diagnosis not present

## 2021-04-04 DIAGNOSIS — M9901 Segmental and somatic dysfunction of cervical region: Secondary | ICD-10-CM | POA: Diagnosis not present

## 2021-04-04 DIAGNOSIS — M4722 Other spondylosis with radiculopathy, cervical region: Secondary | ICD-10-CM | POA: Diagnosis not present

## 2021-04-10 DIAGNOSIS — M4722 Other spondylosis with radiculopathy, cervical region: Secondary | ICD-10-CM | POA: Diagnosis not present

## 2021-04-10 DIAGNOSIS — M5442 Lumbago with sciatica, left side: Secondary | ICD-10-CM | POA: Diagnosis not present

## 2021-04-10 DIAGNOSIS — M9903 Segmental and somatic dysfunction of lumbar region: Secondary | ICD-10-CM | POA: Diagnosis not present

## 2021-04-10 DIAGNOSIS — M9901 Segmental and somatic dysfunction of cervical region: Secondary | ICD-10-CM | POA: Diagnosis not present

## 2021-05-22 DIAGNOSIS — M9901 Segmental and somatic dysfunction of cervical region: Secondary | ICD-10-CM | POA: Diagnosis not present

## 2021-05-22 DIAGNOSIS — M9903 Segmental and somatic dysfunction of lumbar region: Secondary | ICD-10-CM | POA: Diagnosis not present

## 2021-05-22 DIAGNOSIS — M5442 Lumbago with sciatica, left side: Secondary | ICD-10-CM | POA: Diagnosis not present

## 2021-05-22 DIAGNOSIS — M4722 Other spondylosis with radiculopathy, cervical region: Secondary | ICD-10-CM | POA: Diagnosis not present

## 2021-05-30 DIAGNOSIS — M4722 Other spondylosis with radiculopathy, cervical region: Secondary | ICD-10-CM | POA: Diagnosis not present

## 2021-05-30 DIAGNOSIS — M5442 Lumbago with sciatica, left side: Secondary | ICD-10-CM | POA: Diagnosis not present

## 2021-05-30 DIAGNOSIS — M9901 Segmental and somatic dysfunction of cervical region: Secondary | ICD-10-CM | POA: Diagnosis not present

## 2021-05-30 DIAGNOSIS — M9903 Segmental and somatic dysfunction of lumbar region: Secondary | ICD-10-CM | POA: Diagnosis not present

## 2021-06-07 DIAGNOSIS — F419 Anxiety disorder, unspecified: Secondary | ICD-10-CM | POA: Diagnosis not present

## 2021-06-07 DIAGNOSIS — E119 Type 2 diabetes mellitus without complications: Secondary | ICD-10-CM | POA: Diagnosis not present

## 2021-06-07 DIAGNOSIS — E559 Vitamin D deficiency, unspecified: Secondary | ICD-10-CM | POA: Diagnosis not present

## 2021-06-07 DIAGNOSIS — E063 Autoimmune thyroiditis: Secondary | ICD-10-CM | POA: Diagnosis not present

## 2021-06-07 DIAGNOSIS — Z0001 Encounter for general adult medical examination with abnormal findings: Secondary | ICD-10-CM | POA: Diagnosis not present

## 2021-06-07 DIAGNOSIS — E1165 Type 2 diabetes mellitus with hyperglycemia: Secondary | ICD-10-CM | POA: Diagnosis not present

## 2021-06-07 DIAGNOSIS — D510 Vitamin B12 deficiency anemia due to intrinsic factor deficiency: Secondary | ICD-10-CM | POA: Diagnosis not present

## 2021-06-07 DIAGNOSIS — I10 Essential (primary) hypertension: Secondary | ICD-10-CM | POA: Diagnosis not present

## 2021-06-07 DIAGNOSIS — M150 Primary generalized (osteo)arthritis: Secondary | ICD-10-CM | POA: Diagnosis not present

## 2021-06-07 DIAGNOSIS — L03039 Cellulitis of unspecified toe: Secondary | ICD-10-CM | POA: Diagnosis not present

## 2021-06-07 DIAGNOSIS — Z1331 Encounter for screening for depression: Secondary | ICD-10-CM | POA: Diagnosis not present

## 2021-06-07 DIAGNOSIS — Z6828 Body mass index (BMI) 28.0-28.9, adult: Secondary | ICD-10-CM | POA: Diagnosis not present

## 2021-06-12 DIAGNOSIS — M9903 Segmental and somatic dysfunction of lumbar region: Secondary | ICD-10-CM | POA: Diagnosis not present

## 2021-06-12 DIAGNOSIS — M4722 Other spondylosis with radiculopathy, cervical region: Secondary | ICD-10-CM | POA: Diagnosis not present

## 2021-06-12 DIAGNOSIS — M5442 Lumbago with sciatica, left side: Secondary | ICD-10-CM | POA: Diagnosis not present

## 2021-06-12 DIAGNOSIS — M9901 Segmental and somatic dysfunction of cervical region: Secondary | ICD-10-CM | POA: Diagnosis not present

## 2021-07-05 DIAGNOSIS — L570 Actinic keratosis: Secondary | ICD-10-CM | POA: Diagnosis not present

## 2021-07-05 DIAGNOSIS — D225 Melanocytic nevi of trunk: Secondary | ICD-10-CM | POA: Diagnosis not present

## 2021-07-05 DIAGNOSIS — D492 Neoplasm of unspecified behavior of bone, soft tissue, and skin: Secondary | ICD-10-CM | POA: Diagnosis not present

## 2021-07-05 DIAGNOSIS — L821 Other seborrheic keratosis: Secondary | ICD-10-CM | POA: Diagnosis not present

## 2021-07-05 DIAGNOSIS — C4441 Basal cell carcinoma of skin of scalp and neck: Secondary | ICD-10-CM | POA: Diagnosis not present

## 2021-07-05 DIAGNOSIS — L814 Other melanin hyperpigmentation: Secondary | ICD-10-CM | POA: Diagnosis not present

## 2021-07-05 DIAGNOSIS — L218 Other seborrheic dermatitis: Secondary | ICD-10-CM | POA: Diagnosis not present

## 2021-07-12 DIAGNOSIS — H524 Presbyopia: Secondary | ICD-10-CM | POA: Diagnosis not present

## 2021-07-12 DIAGNOSIS — E119 Type 2 diabetes mellitus without complications: Secondary | ICD-10-CM | POA: Diagnosis not present

## 2021-07-12 DIAGNOSIS — Z961 Presence of intraocular lens: Secondary | ICD-10-CM | POA: Diagnosis not present

## 2021-07-12 DIAGNOSIS — Z794 Long term (current) use of insulin: Secondary | ICD-10-CM | POA: Diagnosis not present

## 2021-07-18 DIAGNOSIS — M9903 Segmental and somatic dysfunction of lumbar region: Secondary | ICD-10-CM | POA: Diagnosis not present

## 2021-07-18 DIAGNOSIS — M9901 Segmental and somatic dysfunction of cervical region: Secondary | ICD-10-CM | POA: Diagnosis not present

## 2021-07-18 DIAGNOSIS — M5442 Lumbago with sciatica, left side: Secondary | ICD-10-CM | POA: Diagnosis not present

## 2021-07-18 DIAGNOSIS — M4722 Other spondylosis with radiculopathy, cervical region: Secondary | ICD-10-CM | POA: Diagnosis not present

## 2021-09-05 DIAGNOSIS — M4722 Other spondylosis with radiculopathy, cervical region: Secondary | ICD-10-CM | POA: Diagnosis not present

## 2021-09-05 DIAGNOSIS — M9903 Segmental and somatic dysfunction of lumbar region: Secondary | ICD-10-CM | POA: Diagnosis not present

## 2021-09-05 DIAGNOSIS — M5442 Lumbago with sciatica, left side: Secondary | ICD-10-CM | POA: Diagnosis not present

## 2021-09-05 DIAGNOSIS — M9901 Segmental and somatic dysfunction of cervical region: Secondary | ICD-10-CM | POA: Diagnosis not present

## 2021-09-15 DIAGNOSIS — C4441 Basal cell carcinoma of skin of scalp and neck: Secondary | ICD-10-CM | POA: Diagnosis not present

## 2021-12-02 DIAGNOSIS — M25531 Pain in right wrist: Secondary | ICD-10-CM | POA: Diagnosis not present

## 2021-12-02 DIAGNOSIS — M25561 Pain in right knee: Secondary | ICD-10-CM | POA: Diagnosis not present

## 2021-12-02 DIAGNOSIS — M25562 Pain in left knee: Secondary | ICD-10-CM | POA: Diagnosis not present

## 2021-12-18 DIAGNOSIS — M1712 Unilateral primary osteoarthritis, left knee: Secondary | ICD-10-CM | POA: Diagnosis not present

## 2021-12-18 DIAGNOSIS — M170 Bilateral primary osteoarthritis of knee: Secondary | ICD-10-CM | POA: Diagnosis not present

## 2021-12-18 DIAGNOSIS — M7052 Other bursitis of knee, left knee: Secondary | ICD-10-CM | POA: Diagnosis not present

## 2021-12-22 DIAGNOSIS — R269 Unspecified abnormalities of gait and mobility: Secondary | ICD-10-CM | POA: Diagnosis not present

## 2021-12-22 DIAGNOSIS — M62562 Muscle wasting and atrophy, not elsewhere classified, left lower leg: Secondary | ICD-10-CM | POA: Diagnosis not present

## 2021-12-22 DIAGNOSIS — M25562 Pain in left knee: Secondary | ICD-10-CM | POA: Diagnosis not present

## 2021-12-22 DIAGNOSIS — M25662 Stiffness of left knee, not elsewhere classified: Secondary | ICD-10-CM | POA: Diagnosis not present

## 2022-01-03 DIAGNOSIS — M25562 Pain in left knee: Secondary | ICD-10-CM | POA: Diagnosis not present

## 2022-01-03 DIAGNOSIS — M62562 Muscle wasting and atrophy, not elsewhere classified, left lower leg: Secondary | ICD-10-CM | POA: Diagnosis not present

## 2022-01-03 DIAGNOSIS — R269 Unspecified abnormalities of gait and mobility: Secondary | ICD-10-CM | POA: Diagnosis not present

## 2022-01-03 DIAGNOSIS — M25662 Stiffness of left knee, not elsewhere classified: Secondary | ICD-10-CM | POA: Diagnosis not present

## 2022-01-09 DIAGNOSIS — M25662 Stiffness of left knee, not elsewhere classified: Secondary | ICD-10-CM | POA: Diagnosis not present

## 2022-01-09 DIAGNOSIS — R269 Unspecified abnormalities of gait and mobility: Secondary | ICD-10-CM | POA: Diagnosis not present

## 2022-01-09 DIAGNOSIS — M62562 Muscle wasting and atrophy, not elsewhere classified, left lower leg: Secondary | ICD-10-CM | POA: Diagnosis not present

## 2022-01-09 DIAGNOSIS — M25562 Pain in left knee: Secondary | ICD-10-CM | POA: Diagnosis not present

## 2022-01-10 DIAGNOSIS — L570 Actinic keratosis: Secondary | ICD-10-CM | POA: Diagnosis not present

## 2022-01-10 DIAGNOSIS — Z08 Encounter for follow-up examination after completed treatment for malignant neoplasm: Secondary | ICD-10-CM | POA: Diagnosis not present

## 2022-01-10 DIAGNOSIS — L821 Other seborrheic keratosis: Secondary | ICD-10-CM | POA: Diagnosis not present

## 2022-01-10 DIAGNOSIS — Z85828 Personal history of other malignant neoplasm of skin: Secondary | ICD-10-CM | POA: Diagnosis not present

## 2022-01-10 DIAGNOSIS — D225 Melanocytic nevi of trunk: Secondary | ICD-10-CM | POA: Diagnosis not present

## 2022-01-10 DIAGNOSIS — L814 Other melanin hyperpigmentation: Secondary | ICD-10-CM | POA: Diagnosis not present

## 2022-01-10 DIAGNOSIS — L918 Other hypertrophic disorders of the skin: Secondary | ICD-10-CM | POA: Diagnosis not present

## 2022-01-11 DIAGNOSIS — R269 Unspecified abnormalities of gait and mobility: Secondary | ICD-10-CM | POA: Diagnosis not present

## 2022-01-11 DIAGNOSIS — M25662 Stiffness of left knee, not elsewhere classified: Secondary | ICD-10-CM | POA: Diagnosis not present

## 2022-01-11 DIAGNOSIS — M25562 Pain in left knee: Secondary | ICD-10-CM | POA: Diagnosis not present

## 2022-01-11 DIAGNOSIS — M62562 Muscle wasting and atrophy, not elsewhere classified, left lower leg: Secondary | ICD-10-CM | POA: Diagnosis not present

## 2022-01-15 DIAGNOSIS — M25562 Pain in left knee: Secondary | ICD-10-CM | POA: Diagnosis not present

## 2022-01-15 DIAGNOSIS — M62562 Muscle wasting and atrophy, not elsewhere classified, left lower leg: Secondary | ICD-10-CM | POA: Diagnosis not present

## 2022-01-15 DIAGNOSIS — M25662 Stiffness of left knee, not elsewhere classified: Secondary | ICD-10-CM | POA: Diagnosis not present

## 2022-01-15 DIAGNOSIS — R269 Unspecified abnormalities of gait and mobility: Secondary | ICD-10-CM | POA: Diagnosis not present

## 2022-01-17 DIAGNOSIS — M25562 Pain in left knee: Secondary | ICD-10-CM | POA: Diagnosis not present

## 2022-01-17 DIAGNOSIS — R269 Unspecified abnormalities of gait and mobility: Secondary | ICD-10-CM | POA: Diagnosis not present

## 2022-01-17 DIAGNOSIS — M25662 Stiffness of left knee, not elsewhere classified: Secondary | ICD-10-CM | POA: Diagnosis not present

## 2022-01-17 DIAGNOSIS — M62562 Muscle wasting and atrophy, not elsewhere classified, left lower leg: Secondary | ICD-10-CM | POA: Diagnosis not present

## 2022-01-22 DIAGNOSIS — M62562 Muscle wasting and atrophy, not elsewhere classified, left lower leg: Secondary | ICD-10-CM | POA: Diagnosis not present

## 2022-01-22 DIAGNOSIS — M25562 Pain in left knee: Secondary | ICD-10-CM | POA: Diagnosis not present

## 2022-01-22 DIAGNOSIS — M25662 Stiffness of left knee, not elsewhere classified: Secondary | ICD-10-CM | POA: Diagnosis not present

## 2022-01-22 DIAGNOSIS — R269 Unspecified abnormalities of gait and mobility: Secondary | ICD-10-CM | POA: Diagnosis not present

## 2022-01-24 DIAGNOSIS — M25662 Stiffness of left knee, not elsewhere classified: Secondary | ICD-10-CM | POA: Diagnosis not present

## 2022-01-24 DIAGNOSIS — M25562 Pain in left knee: Secondary | ICD-10-CM | POA: Diagnosis not present

## 2022-01-24 DIAGNOSIS — R269 Unspecified abnormalities of gait and mobility: Secondary | ICD-10-CM | POA: Diagnosis not present

## 2022-01-24 DIAGNOSIS — M62562 Muscle wasting and atrophy, not elsewhere classified, left lower leg: Secondary | ICD-10-CM | POA: Diagnosis not present

## 2022-01-31 ENCOUNTER — Other Ambulatory Visit (HOSPITAL_COMMUNITY): Payer: Self-pay | Admitting: Internal Medicine

## 2022-01-31 DIAGNOSIS — E2839 Other primary ovarian failure: Secondary | ICD-10-CM

## 2022-01-31 DIAGNOSIS — R269 Unspecified abnormalities of gait and mobility: Secondary | ICD-10-CM | POA: Diagnosis not present

## 2022-01-31 DIAGNOSIS — M62562 Muscle wasting and atrophy, not elsewhere classified, left lower leg: Secondary | ICD-10-CM | POA: Diagnosis not present

## 2022-01-31 DIAGNOSIS — M25662 Stiffness of left knee, not elsewhere classified: Secondary | ICD-10-CM | POA: Diagnosis not present

## 2022-01-31 DIAGNOSIS — E663 Overweight: Secondary | ICD-10-CM | POA: Diagnosis not present

## 2022-01-31 DIAGNOSIS — E1165 Type 2 diabetes mellitus with hyperglycemia: Secondary | ICD-10-CM | POA: Diagnosis not present

## 2022-01-31 DIAGNOSIS — M25562 Pain in left knee: Secondary | ICD-10-CM | POA: Diagnosis not present

## 2022-01-31 DIAGNOSIS — Z6827 Body mass index (BMI) 27.0-27.9, adult: Secondary | ICD-10-CM | POA: Diagnosis not present

## 2022-02-02 DIAGNOSIS — M25562 Pain in left knee: Secondary | ICD-10-CM | POA: Diagnosis not present

## 2022-02-02 DIAGNOSIS — M62562 Muscle wasting and atrophy, not elsewhere classified, left lower leg: Secondary | ICD-10-CM | POA: Diagnosis not present

## 2022-02-02 DIAGNOSIS — M25662 Stiffness of left knee, not elsewhere classified: Secondary | ICD-10-CM | POA: Diagnosis not present

## 2022-02-02 DIAGNOSIS — R269 Unspecified abnormalities of gait and mobility: Secondary | ICD-10-CM | POA: Diagnosis not present

## 2022-02-07 DIAGNOSIS — M25562 Pain in left knee: Secondary | ICD-10-CM | POA: Diagnosis not present

## 2022-02-07 DIAGNOSIS — M62562 Muscle wasting and atrophy, not elsewhere classified, left lower leg: Secondary | ICD-10-CM | POA: Diagnosis not present

## 2022-02-07 DIAGNOSIS — M25662 Stiffness of left knee, not elsewhere classified: Secondary | ICD-10-CM | POA: Diagnosis not present

## 2022-02-07 DIAGNOSIS — R269 Unspecified abnormalities of gait and mobility: Secondary | ICD-10-CM | POA: Diagnosis not present

## 2022-02-09 ENCOUNTER — Inpatient Hospital Stay (HOSPITAL_COMMUNITY): Admission: RE | Admit: 2022-02-09 | Payer: Medicare PPO | Source: Ambulatory Visit

## 2022-02-09 DIAGNOSIS — M62562 Muscle wasting and atrophy, not elsewhere classified, left lower leg: Secondary | ICD-10-CM | POA: Diagnosis not present

## 2022-02-09 DIAGNOSIS — M25562 Pain in left knee: Secondary | ICD-10-CM | POA: Diagnosis not present

## 2022-02-09 DIAGNOSIS — M25662 Stiffness of left knee, not elsewhere classified: Secondary | ICD-10-CM | POA: Diagnosis not present

## 2022-02-09 DIAGNOSIS — R269 Unspecified abnormalities of gait and mobility: Secondary | ICD-10-CM | POA: Diagnosis not present

## 2022-02-14 DIAGNOSIS — M25562 Pain in left knee: Secondary | ICD-10-CM | POA: Diagnosis not present

## 2022-02-14 DIAGNOSIS — M62562 Muscle wasting and atrophy, not elsewhere classified, left lower leg: Secondary | ICD-10-CM | POA: Diagnosis not present

## 2022-02-14 DIAGNOSIS — M25662 Stiffness of left knee, not elsewhere classified: Secondary | ICD-10-CM | POA: Diagnosis not present

## 2022-02-14 DIAGNOSIS — R269 Unspecified abnormalities of gait and mobility: Secondary | ICD-10-CM | POA: Diagnosis not present

## 2022-02-16 DIAGNOSIS — M25562 Pain in left knee: Secondary | ICD-10-CM | POA: Diagnosis not present

## 2022-02-16 DIAGNOSIS — R269 Unspecified abnormalities of gait and mobility: Secondary | ICD-10-CM | POA: Diagnosis not present

## 2022-02-16 DIAGNOSIS — M25662 Stiffness of left knee, not elsewhere classified: Secondary | ICD-10-CM | POA: Diagnosis not present

## 2022-02-16 DIAGNOSIS — M62562 Muscle wasting and atrophy, not elsewhere classified, left lower leg: Secondary | ICD-10-CM | POA: Diagnosis not present

## 2022-02-21 DIAGNOSIS — M25662 Stiffness of left knee, not elsewhere classified: Secondary | ICD-10-CM | POA: Diagnosis not present

## 2022-02-21 DIAGNOSIS — R269 Unspecified abnormalities of gait and mobility: Secondary | ICD-10-CM | POA: Diagnosis not present

## 2022-02-21 DIAGNOSIS — M62562 Muscle wasting and atrophy, not elsewhere classified, left lower leg: Secondary | ICD-10-CM | POA: Diagnosis not present

## 2022-02-21 DIAGNOSIS — M25562 Pain in left knee: Secondary | ICD-10-CM | POA: Diagnosis not present

## 2022-02-23 DIAGNOSIS — M25562 Pain in left knee: Secondary | ICD-10-CM | POA: Diagnosis not present

## 2022-02-23 DIAGNOSIS — M62562 Muscle wasting and atrophy, not elsewhere classified, left lower leg: Secondary | ICD-10-CM | POA: Diagnosis not present

## 2022-02-23 DIAGNOSIS — R269 Unspecified abnormalities of gait and mobility: Secondary | ICD-10-CM | POA: Diagnosis not present

## 2022-02-23 DIAGNOSIS — M25662 Stiffness of left knee, not elsewhere classified: Secondary | ICD-10-CM | POA: Diagnosis not present

## 2022-02-28 DIAGNOSIS — M62562 Muscle wasting and atrophy, not elsewhere classified, left lower leg: Secondary | ICD-10-CM | POA: Diagnosis not present

## 2022-02-28 DIAGNOSIS — R269 Unspecified abnormalities of gait and mobility: Secondary | ICD-10-CM | POA: Diagnosis not present

## 2022-02-28 DIAGNOSIS — M25562 Pain in left knee: Secondary | ICD-10-CM | POA: Diagnosis not present

## 2022-02-28 DIAGNOSIS — M25662 Stiffness of left knee, not elsewhere classified: Secondary | ICD-10-CM | POA: Diagnosis not present

## 2022-03-02 DIAGNOSIS — M25562 Pain in left knee: Secondary | ICD-10-CM | POA: Diagnosis not present

## 2022-03-02 DIAGNOSIS — M25662 Stiffness of left knee, not elsewhere classified: Secondary | ICD-10-CM | POA: Diagnosis not present

## 2022-03-02 DIAGNOSIS — R269 Unspecified abnormalities of gait and mobility: Secondary | ICD-10-CM | POA: Diagnosis not present

## 2022-03-02 DIAGNOSIS — M62562 Muscle wasting and atrophy, not elsewhere classified, left lower leg: Secondary | ICD-10-CM | POA: Diagnosis not present

## 2022-03-05 ENCOUNTER — Ambulatory Visit (HOSPITAL_COMMUNITY)
Admission: RE | Admit: 2022-03-05 | Discharge: 2022-03-05 | Disposition: A | Discharge status: Home / Self Care | Payer: HMO | Source: Ambulatory Visit | Attending: Internal Medicine | Admitting: Internal Medicine

## 2022-03-05 ENCOUNTER — Other Ambulatory Visit (HOSPITAL_COMMUNITY): Payer: Self-pay | Admitting: Internal Medicine

## 2022-03-05 DIAGNOSIS — M8589 Other specified disorders of bone density and structure, multiple sites: Secondary | ICD-10-CM | POA: Diagnosis not present

## 2022-03-05 DIAGNOSIS — Z1231 Encounter for screening mammogram for malignant neoplasm of breast: Secondary | ICD-10-CM | POA: Diagnosis not present

## 2022-03-05 DIAGNOSIS — E2839 Other primary ovarian failure: Secondary | ICD-10-CM | POA: Diagnosis not present

## 2022-03-05 DIAGNOSIS — Z780 Asymptomatic menopausal state: Secondary | ICD-10-CM | POA: Diagnosis not present

## 2022-03-07 DIAGNOSIS — M25662 Stiffness of left knee, not elsewhere classified: Secondary | ICD-10-CM | POA: Diagnosis not present

## 2022-03-07 DIAGNOSIS — R269 Unspecified abnormalities of gait and mobility: Secondary | ICD-10-CM | POA: Diagnosis not present

## 2022-03-07 DIAGNOSIS — M62562 Muscle wasting and atrophy, not elsewhere classified, left lower leg: Secondary | ICD-10-CM | POA: Diagnosis not present

## 2022-03-07 DIAGNOSIS — M25562 Pain in left knee: Secondary | ICD-10-CM | POA: Diagnosis not present

## 2022-03-14 DIAGNOSIS — M62562 Muscle wasting and atrophy, not elsewhere classified, left lower leg: Secondary | ICD-10-CM | POA: Diagnosis not present

## 2022-03-14 DIAGNOSIS — M25662 Stiffness of left knee, not elsewhere classified: Secondary | ICD-10-CM | POA: Diagnosis not present

## 2022-03-14 DIAGNOSIS — R269 Unspecified abnormalities of gait and mobility: Secondary | ICD-10-CM | POA: Diagnosis not present

## 2022-03-14 DIAGNOSIS — M25562 Pain in left knee: Secondary | ICD-10-CM | POA: Diagnosis not present

## 2022-03-15 DIAGNOSIS — Z961 Presence of intraocular lens: Secondary | ICD-10-CM | POA: Diagnosis not present

## 2022-03-15 DIAGNOSIS — H26492 Other secondary cataract, left eye: Secondary | ICD-10-CM | POA: Diagnosis not present

## 2022-03-21 DIAGNOSIS — M25562 Pain in left knee: Secondary | ICD-10-CM | POA: Diagnosis not present

## 2022-03-21 DIAGNOSIS — Z961 Presence of intraocular lens: Secondary | ICD-10-CM | POA: Diagnosis not present

## 2022-03-21 DIAGNOSIS — M62562 Muscle wasting and atrophy, not elsewhere classified, left lower leg: Secondary | ICD-10-CM | POA: Diagnosis not present

## 2022-03-21 DIAGNOSIS — H26492 Other secondary cataract, left eye: Secondary | ICD-10-CM | POA: Diagnosis not present

## 2022-03-21 DIAGNOSIS — M25662 Stiffness of left knee, not elsewhere classified: Secondary | ICD-10-CM | POA: Diagnosis not present

## 2022-03-21 DIAGNOSIS — R269 Unspecified abnormalities of gait and mobility: Secondary | ICD-10-CM | POA: Diagnosis not present

## 2022-03-21 DIAGNOSIS — Z48810 Encounter for surgical aftercare following surgery on the sense organs: Secondary | ICD-10-CM | POA: Diagnosis not present

## 2022-03-28 DIAGNOSIS — R269 Unspecified abnormalities of gait and mobility: Secondary | ICD-10-CM | POA: Diagnosis not present

## 2022-03-28 DIAGNOSIS — M25662 Stiffness of left knee, not elsewhere classified: Secondary | ICD-10-CM | POA: Diagnosis not present

## 2022-03-28 DIAGNOSIS — M25562 Pain in left knee: Secondary | ICD-10-CM | POA: Diagnosis not present

## 2022-03-28 DIAGNOSIS — M62562 Muscle wasting and atrophy, not elsewhere classified, left lower leg: Secondary | ICD-10-CM | POA: Diagnosis not present

## 2022-05-03 IMAGING — DX DG CHEST 2V
2 series · 2 of 2 positions shown · non-contrast
Comparison: 11/13/2019

CLINICAL DATA: Productive cough for over 2 weeks.

EXAM:
CHEST - 2 VIEW

[chest pa]
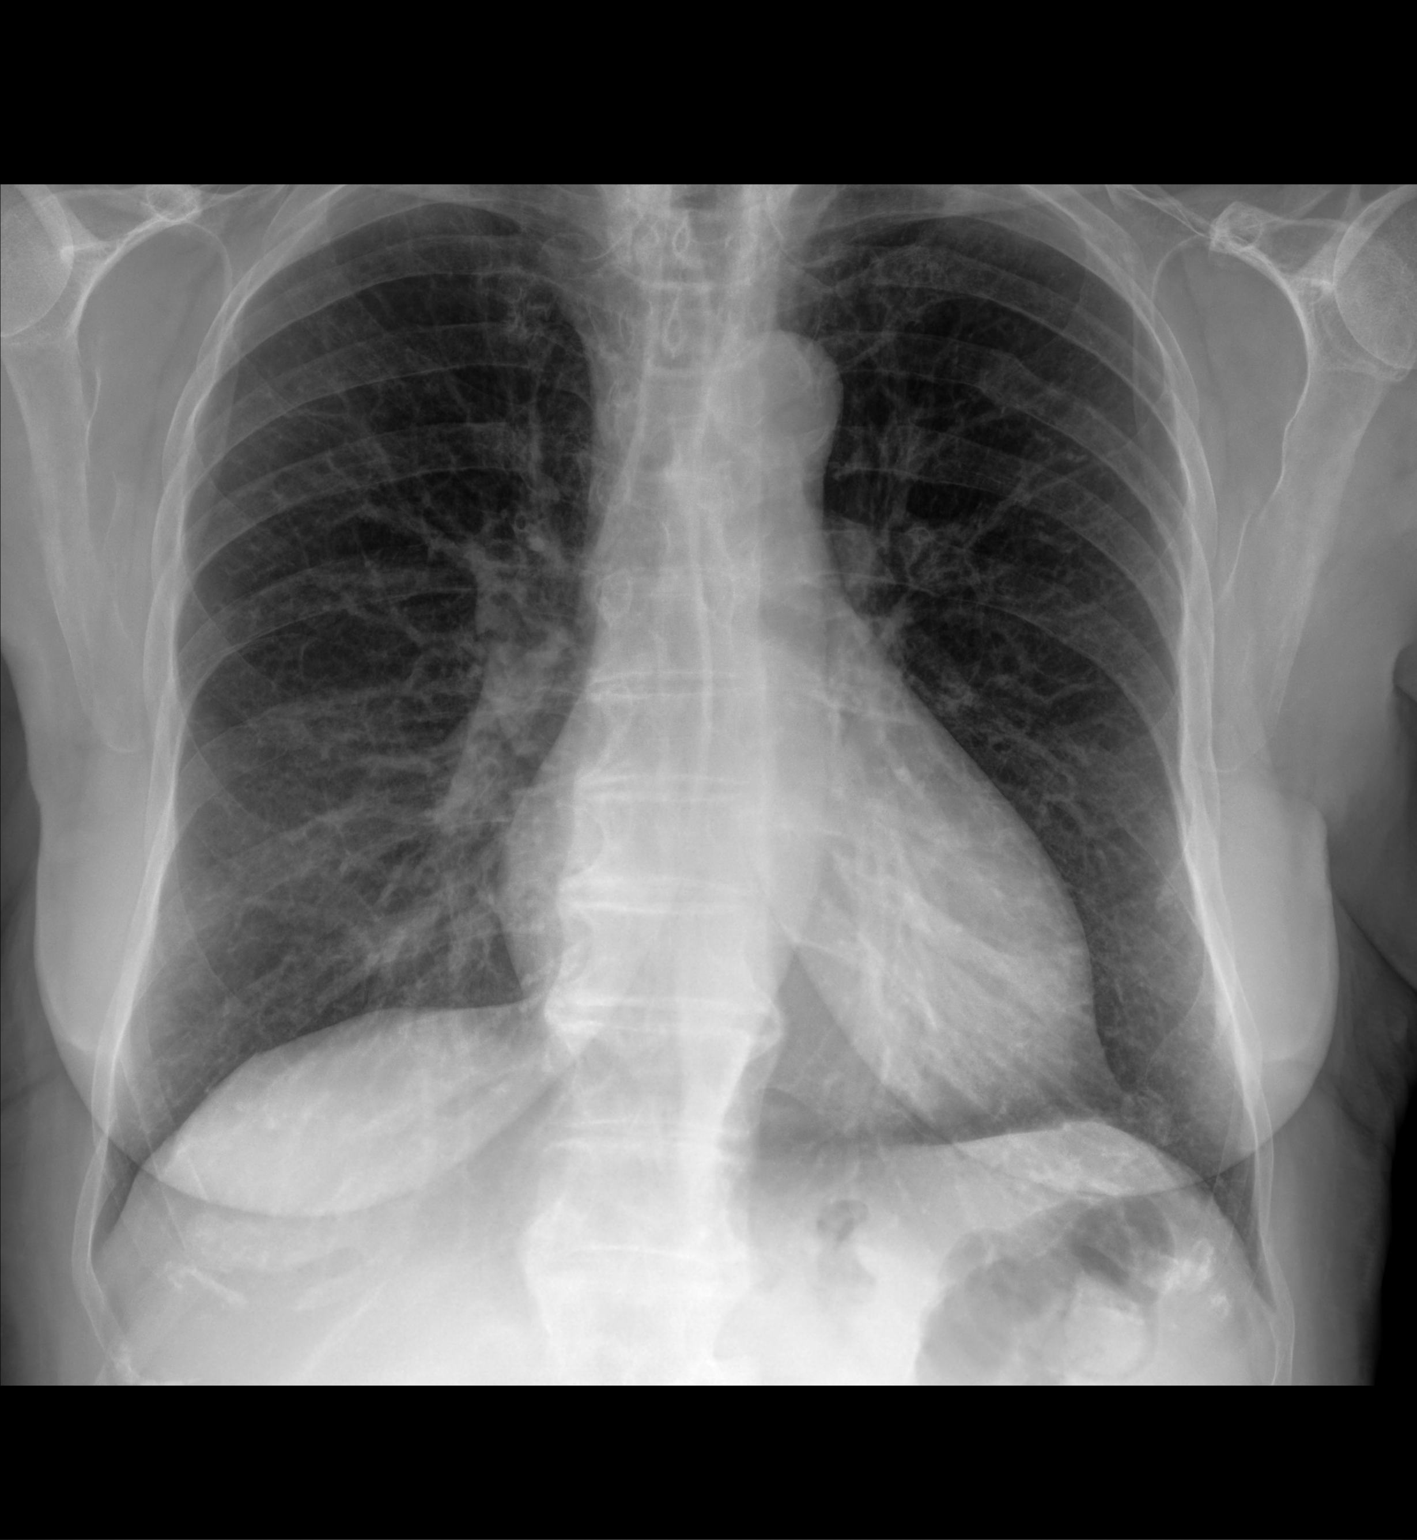

[chest lat]
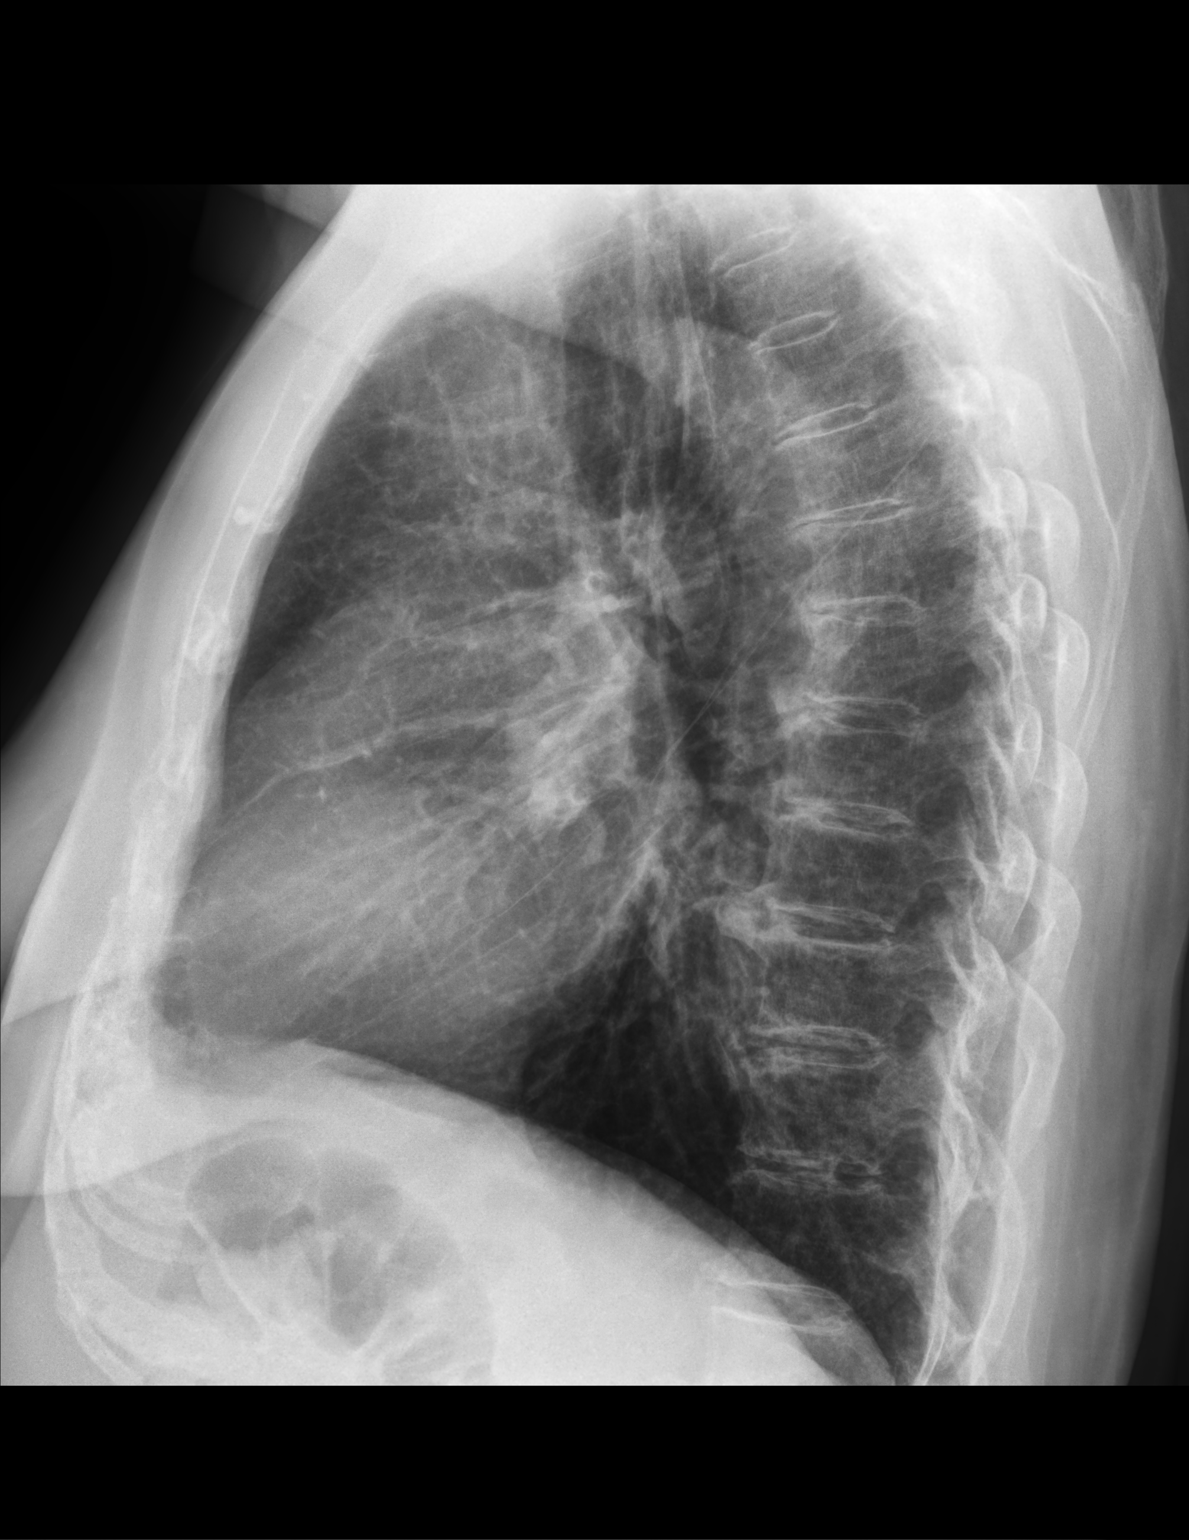

[2 of 2 positions shown; findings below may reference images not displayed]

FINDINGS: The heart size and mediastinal contours are within normal limits.
Both lungs are clear. Several old left rib fracture deformities are
noted.
IMPRESSION: No active cardiopulmonary disease.

## 2022-05-04 DIAGNOSIS — B372 Candidiasis of skin and nail: Secondary | ICD-10-CM | POA: Diagnosis not present

## 2022-05-04 DIAGNOSIS — E1136 Type 2 diabetes mellitus with diabetic cataract: Secondary | ICD-10-CM | POA: Diagnosis not present

## 2022-05-04 DIAGNOSIS — D510 Vitamin B12 deficiency anemia due to intrinsic factor deficiency: Secondary | ICD-10-CM | POA: Diagnosis not present

## 2022-05-04 DIAGNOSIS — E663 Overweight: Secondary | ICD-10-CM | POA: Diagnosis not present

## 2022-05-04 DIAGNOSIS — E785 Hyperlipidemia, unspecified: Secondary | ICD-10-CM | POA: Diagnosis not present

## 2022-05-04 DIAGNOSIS — B351 Tinea unguium: Secondary | ICD-10-CM | POA: Diagnosis not present

## 2022-05-04 DIAGNOSIS — F411 Generalized anxiety disorder: Secondary | ICD-10-CM | POA: Diagnosis not present

## 2022-05-04 DIAGNOSIS — E1122 Type 2 diabetes mellitus with diabetic chronic kidney disease: Secondary | ICD-10-CM | POA: Diagnosis not present

## 2022-05-04 DIAGNOSIS — Z794 Long term (current) use of insulin: Secondary | ICD-10-CM | POA: Diagnosis not present

## 2022-05-04 DIAGNOSIS — E1165 Type 2 diabetes mellitus with hyperglycemia: Secondary | ICD-10-CM | POA: Diagnosis not present

## 2022-05-04 DIAGNOSIS — D8481 Immunodeficiency due to conditions classified elsewhere: Secondary | ICD-10-CM | POA: Diagnosis not present

## 2022-05-04 DIAGNOSIS — E1169 Type 2 diabetes mellitus with other specified complication: Secondary | ICD-10-CM | POA: Diagnosis not present

## 2022-06-06 DIAGNOSIS — Z961 Presence of intraocular lens: Secondary | ICD-10-CM | POA: Diagnosis not present

## 2022-06-06 DIAGNOSIS — H524 Presbyopia: Secondary | ICD-10-CM | POA: Diagnosis not present

## 2022-06-06 DIAGNOSIS — H00021 Hordeolum internum right upper eyelid: Secondary | ICD-10-CM | POA: Diagnosis not present

## 2022-06-06 DIAGNOSIS — Z794 Long term (current) use of insulin: Secondary | ICD-10-CM | POA: Diagnosis not present

## 2022-06-06 DIAGNOSIS — E119 Type 2 diabetes mellitus without complications: Secondary | ICD-10-CM | POA: Diagnosis not present

## 2022-06-13 ENCOUNTER — Other Ambulatory Visit (HOSPITAL_COMMUNITY): Payer: Self-pay | Admitting: Internal Medicine

## 2022-06-13 DIAGNOSIS — E559 Vitamin D deficiency, unspecified: Secondary | ICD-10-CM | POA: Diagnosis not present

## 2022-06-13 DIAGNOSIS — E6609 Other obesity due to excess calories: Secondary | ICD-10-CM | POA: Diagnosis not present

## 2022-06-13 DIAGNOSIS — M150 Primary generalized (osteo)arthritis: Secondary | ICD-10-CM | POA: Diagnosis not present

## 2022-06-13 DIAGNOSIS — Z1231 Encounter for screening mammogram for malignant neoplasm of breast: Secondary | ICD-10-CM

## 2022-06-13 DIAGNOSIS — D518 Other vitamin B12 deficiency anemias: Secondary | ICD-10-CM | POA: Diagnosis not present

## 2022-06-13 DIAGNOSIS — G894 Chronic pain syndrome: Secondary | ICD-10-CM | POA: Diagnosis not present

## 2022-06-13 DIAGNOSIS — Z0001 Encounter for general adult medical examination with abnormal findings: Secondary | ICD-10-CM | POA: Diagnosis not present

## 2022-06-13 DIAGNOSIS — G9332 Myalgic encephalomyelitis/chronic fatigue syndrome: Secondary | ICD-10-CM | POA: Diagnosis not present

## 2022-06-13 DIAGNOSIS — Z1331 Encounter for screening for depression: Secondary | ICD-10-CM | POA: Diagnosis not present

## 2022-06-13 DIAGNOSIS — Z6828 Body mass index (BMI) 28.0-28.9, adult: Secondary | ICD-10-CM | POA: Diagnosis not present

## 2022-06-13 DIAGNOSIS — E1165 Type 2 diabetes mellitus with hyperglycemia: Secondary | ICD-10-CM | POA: Diagnosis not present

## 2022-06-13 DIAGNOSIS — E119 Type 2 diabetes mellitus without complications: Secondary | ICD-10-CM | POA: Diagnosis not present

## 2022-07-11 DIAGNOSIS — L570 Actinic keratosis: Secondary | ICD-10-CM | POA: Diagnosis not present

## 2022-07-11 DIAGNOSIS — D225 Melanocytic nevi of trunk: Secondary | ICD-10-CM | POA: Diagnosis not present

## 2022-07-11 DIAGNOSIS — L821 Other seborrheic keratosis: Secondary | ICD-10-CM | POA: Diagnosis not present

## 2022-07-11 DIAGNOSIS — L814 Other melanin hyperpigmentation: Secondary | ICD-10-CM | POA: Diagnosis not present

## 2022-10-08 DIAGNOSIS — E782 Mixed hyperlipidemia: Secondary | ICD-10-CM | POA: Diagnosis not present

## 2022-10-08 DIAGNOSIS — E1165 Type 2 diabetes mellitus with hyperglycemia: Secondary | ICD-10-CM | POA: Diagnosis not present

## 2022-10-08 DIAGNOSIS — I10 Essential (primary) hypertension: Secondary | ICD-10-CM | POA: Diagnosis not present

## 2022-10-09 DIAGNOSIS — Z23 Encounter for immunization: Secondary | ICD-10-CM | POA: Diagnosis not present

## 2022-10-22 ENCOUNTER — Encounter (HOSPITAL_COMMUNITY): Payer: Self-pay

## 2022-10-22 ENCOUNTER — Emergency Department (HOSPITAL_COMMUNITY): Payer: HMO

## 2022-10-22 ENCOUNTER — Other Ambulatory Visit: Payer: Self-pay

## 2022-10-22 ENCOUNTER — Inpatient Hospital Stay (HOSPITAL_COMMUNITY)
Admission: EM | Admit: 2022-10-22 | Discharge: 2022-10-27 | DRG: 309 | Disposition: A | Discharge status: Home / Self Care | Payer: HMO | Attending: Internal Medicine | Admitting: Internal Medicine

## 2022-10-22 DIAGNOSIS — M549 Dorsalgia, unspecified: Secondary | ICD-10-CM | POA: Diagnosis present

## 2022-10-22 DIAGNOSIS — R001 Bradycardia, unspecified: Principal | ICD-10-CM

## 2022-10-22 DIAGNOSIS — I4891 Unspecified atrial fibrillation: Secondary | ICD-10-CM | POA: Diagnosis not present

## 2022-10-22 DIAGNOSIS — Z79899 Other long term (current) drug therapy: Secondary | ICD-10-CM

## 2022-10-22 DIAGNOSIS — C787 Secondary malignant neoplasm of liver and intrahepatic bile duct: Secondary | ICD-10-CM | POA: Diagnosis present

## 2022-10-22 DIAGNOSIS — R160 Hepatomegaly, not elsewhere classified: Secondary | ICD-10-CM | POA: Insufficient documentation

## 2022-10-22 DIAGNOSIS — K30 Functional dyspepsia: Secondary | ICD-10-CM | POA: Diagnosis present

## 2022-10-22 DIAGNOSIS — Z833 Family history of diabetes mellitus: Secondary | ICD-10-CM

## 2022-10-22 DIAGNOSIS — C221 Intrahepatic bile duct carcinoma: Secondary | ICD-10-CM | POA: Diagnosis present

## 2022-10-22 DIAGNOSIS — R911 Solitary pulmonary nodule: Secondary | ICD-10-CM | POA: Diagnosis not present

## 2022-10-22 DIAGNOSIS — Z79890 Hormone replacement therapy (postmenopausal): Secondary | ICD-10-CM

## 2022-10-22 DIAGNOSIS — Z794 Long term (current) use of insulin: Secondary | ICD-10-CM

## 2022-10-22 DIAGNOSIS — F419 Anxiety disorder, unspecified: Secondary | ICD-10-CM | POA: Diagnosis present

## 2022-10-22 DIAGNOSIS — Z8261 Family history of arthritis: Secondary | ICD-10-CM

## 2022-10-22 DIAGNOSIS — Z90710 Acquired absence of both cervix and uterus: Secondary | ICD-10-CM

## 2022-10-22 DIAGNOSIS — K219 Gastro-esophageal reflux disease without esophagitis: Secondary | ICD-10-CM | POA: Insufficient documentation

## 2022-10-22 DIAGNOSIS — E785 Hyperlipidemia, unspecified: Secondary | ICD-10-CM | POA: Insufficient documentation

## 2022-10-22 DIAGNOSIS — R918 Other nonspecific abnormal finding of lung field: Secondary | ICD-10-CM | POA: Diagnosis not present

## 2022-10-22 DIAGNOSIS — Z7984 Long term (current) use of oral hypoglycemic drugs: Secondary | ICD-10-CM

## 2022-10-22 DIAGNOSIS — Z808 Family history of malignant neoplasm of other organs or systems: Secondary | ICD-10-CM

## 2022-10-22 DIAGNOSIS — E119 Type 2 diabetes mellitus without complications: Secondary | ICD-10-CM

## 2022-10-22 DIAGNOSIS — E1165 Type 2 diabetes mellitus with hyperglycemia: Secondary | ICD-10-CM | POA: Diagnosis present

## 2022-10-22 DIAGNOSIS — C801 Malignant (primary) neoplasm, unspecified: Secondary | ICD-10-CM | POA: Diagnosis present

## 2022-10-22 DIAGNOSIS — R1011 Right upper quadrant pain: Secondary | ICD-10-CM | POA: Diagnosis not present

## 2022-10-22 DIAGNOSIS — I10 Essential (primary) hypertension: Secondary | ICD-10-CM | POA: Insufficient documentation

## 2022-10-22 DIAGNOSIS — Z807 Family history of other malignant neoplasms of lymphoid, hematopoietic and related tissues: Secondary | ICD-10-CM

## 2022-10-22 DIAGNOSIS — Z91048 Other nonmedicinal substance allergy status: Secondary | ICD-10-CM

## 2022-10-22 DIAGNOSIS — K5900 Constipation, unspecified: Secondary | ICD-10-CM | POA: Diagnosis present

## 2022-10-22 DIAGNOSIS — J439 Emphysema, unspecified: Secondary | ICD-10-CM | POA: Diagnosis not present

## 2022-10-22 DIAGNOSIS — Z8249 Family history of ischemic heart disease and other diseases of the circulatory system: Secondary | ICD-10-CM

## 2022-10-22 DIAGNOSIS — Z87891 Personal history of nicotine dependence: Secondary | ICD-10-CM

## 2022-10-22 DIAGNOSIS — E039 Hypothyroidism, unspecified: Secondary | ICD-10-CM | POA: Insufficient documentation

## 2022-10-22 DIAGNOSIS — I700 Atherosclerosis of aorta: Secondary | ICD-10-CM | POA: Diagnosis not present

## 2022-10-22 DIAGNOSIS — Z9049 Acquired absence of other specified parts of digestive tract: Secondary | ICD-10-CM

## 2022-10-22 LAB — CBC
HCT: 45.5 % (ref 36.0–46.0)
Hemoglobin: 14.5 g/dL (ref 12.0–15.0)
MCH: 27.7 pg (ref 26.0–34.0)
MCHC: 31.9 g/dL (ref 30.0–36.0)
MCV: 86.8 fL (ref 80.0–100.0)
Platelets: 251 10*3/uL (ref 150–400)
RBC: 5.24 MIL/uL — ABNORMAL HIGH (ref 3.87–5.11)
RDW: 13.9 % (ref 11.5–15.5)
WBC: 10.9 10*3/uL — ABNORMAL HIGH (ref 4.0–10.5)
nRBC: 0 % (ref 0.0–0.2)

## 2022-10-22 LAB — URINALYSIS, ROUTINE W REFLEX MICROSCOPIC
Bilirubin Urine: NEGATIVE
Glucose, UA: 50 mg/dL — AB
Hgb urine dipstick: NEGATIVE
Ketones, ur: NEGATIVE mg/dL
Leukocytes,Ua: NEGATIVE
Nitrite: NEGATIVE
Protein, ur: 100 mg/dL — AB
Specific Gravity, Urine: 1.024 (ref 1.005–1.030)
pH: 5 (ref 5.0–8.0)

## 2022-10-22 LAB — COMPREHENSIVE METABOLIC PANEL
ALT: 35 U/L (ref 0–44)
AST: 74 U/L — ABNORMAL HIGH (ref 15–41)
Albumin: 4.2 g/dL (ref 3.5–5.0)
Alkaline Phosphatase: 69 U/L (ref 38–126)
Anion gap: 8 (ref 5–15)
BUN: 16 mg/dL (ref 8–23)
CO2: 26 mmol/L (ref 22–32)
Calcium: 9.8 mg/dL (ref 8.9–10.3)
Chloride: 100 mmol/L (ref 98–111)
Creatinine, Ser: 1.01 mg/dL — ABNORMAL HIGH (ref 0.44–1.00)
GFR, Estimated: 57 mL/min — ABNORMAL LOW (ref >60)
Glucose, Bld: 324 mg/dL — ABNORMAL HIGH (ref 70–99)
Potassium: 4.3 mmol/L (ref 3.5–5.1)
Sodium: 134 mmol/L — ABNORMAL LOW (ref 135–145)
Total Bilirubin: 2 mg/dL — ABNORMAL HIGH (ref 0.3–1.2)
Total Protein: 8 g/dL (ref 6.5–8.1)

## 2022-10-22 LAB — TROPONIN I (HIGH SENSITIVITY)
Troponin I (High Sensitivity): 7 ng/L (ref <18)
Troponin I (High Sensitivity): 8 ng/L (ref <18)

## 2022-10-22 LAB — LIPASE, BLOOD: Lipase: 37 U/L (ref 11–51)

## 2022-10-22 LAB — TSH: TSH: 1.185 u[IU]/mL (ref 0.350–4.500)

## 2022-10-22 MED ORDER — HEPARIN BOLUS VIA INFUSION
4000.0000 [IU] | Freq: Once | INTRAVENOUS | Status: AC
  Administered 2022-10-22: 4000 [IU] via INTRAVENOUS

## 2022-10-22 MED ORDER — IOHEXOL 300 MG/ML  SOLN
100.0000 mL | Freq: Once | INTRAMUSCULAR | Status: AC | PRN
  Administered 2022-10-22: 100 mL via INTRAVENOUS

## 2022-10-22 MED ORDER — INSULIN ASPART 100 UNIT/ML IJ SOLN
0.0000 [IU] | Freq: Three times a day (TID) | INTRAMUSCULAR | Status: DC
  Administered 2022-10-23: 3 [IU] via SUBCUTANEOUS
  Administered 2022-10-23 (×2): 5 [IU] via SUBCUTANEOUS
  Administered 2022-10-24: 8 [IU] via SUBCUTANEOUS
  Administered 2022-10-24: 3 [IU] via SUBCUTANEOUS
  Administered 2022-10-25: 2 [IU] via SUBCUTANEOUS
  Administered 2022-10-25 – 2022-10-26 (×3): 3 [IU] via SUBCUTANEOUS
  Administered 2022-10-27: 2 [IU] via SUBCUTANEOUS

## 2022-10-22 MED ORDER — HYDROCODONE-ACETAMINOPHEN 5-325 MG PO TABS
1.0000 | ORAL_TABLET | Freq: Once | ORAL | Status: AC
  Administered 2022-10-22: 1 via ORAL
  Filled 2022-10-22: qty 1

## 2022-10-22 MED ORDER — LEVOTHYROXINE SODIUM 137 MCG PO TABS: 137.0000 ug | ORAL_TABLET | Freq: Every day | ORAL | Status: DC

## 2022-10-22 MED ORDER — ROSUVASTATIN CALCIUM 10 MG PO TABS
10.0000 mg | ORAL_TABLET | ORAL | Status: DC
  Administered 2022-10-23: 10 mg via ORAL
  Filled 2022-10-22 (×4): qty 1

## 2022-10-22 MED ORDER — ACETAMINOPHEN 325 MG PO TABS
650.0000 mg | ORAL_TABLET | Freq: Four times a day (QID) | ORAL | Status: DC | PRN
  Administered 2022-10-23: 650 mg via ORAL
  Filled 2022-10-22: qty 2

## 2022-10-22 MED ORDER — ONDANSETRON HCL 4 MG/2ML IJ SOLN
4.0000 mg | Freq: Four times a day (QID) | INTRAMUSCULAR | Status: DC | PRN
  Administered 2022-10-23: 4 mg via INTRAVENOUS
  Filled 2022-10-22: qty 2

## 2022-10-22 MED ORDER — ACETAMINOPHEN 650 MG RE SUPP: 650.0000 mg | Freq: Four times a day (QID) | RECTAL | Status: DC | PRN

## 2022-10-22 MED ORDER — LEVOTHYROXINE SODIUM 137 MCG PO TABS
137.0000 ug | ORAL_TABLET | Freq: Every day | ORAL | Status: DC
  Administered 2022-10-23 – 2022-10-27 (×5): 137 ug via ORAL
  Filled 2022-10-22 (×4): qty 1

## 2022-10-22 MED ORDER — FAMOTIDINE 20 MG PO TABS
20.0000 mg | ORAL_TABLET | Freq: Every day | ORAL | Status: DC
  Administered 2022-10-23 – 2022-10-27 (×4): 20 mg via ORAL
  Filled 2022-10-22 (×5): qty 1

## 2022-10-22 MED ORDER — INSULIN ASPART 100 UNIT/ML IJ SOLN
0.0000 [IU] | Freq: Every day | INTRAMUSCULAR | Status: DC
  Administered 2022-10-23: 2 [IU] via SUBCUTANEOUS
  Filled 2022-10-22: qty 1

## 2022-10-22 MED ORDER — INSULIN GLARGINE-YFGN 100 UNIT/ML ~~LOC~~ SOLN
20.0000 [IU] | Freq: Every day | SUBCUTANEOUS | Status: DC
  Administered 2022-10-23 – 2022-10-26 (×4): 20 [IU] via SUBCUTANEOUS
  Filled 2022-10-22 (×6): qty 0.2

## 2022-10-22 MED ORDER — ALPRAZOLAM 0.5 MG PO TABS
0.5000 mg | ORAL_TABLET | Freq: Every evening | ORAL | Status: DC | PRN
  Administered 2022-10-23 – 2022-10-24 (×3): 0.5 mg via ORAL
  Filled 2022-10-22 (×3): qty 1

## 2022-10-22 MED ORDER — ONDANSETRON HCL 4 MG PO TABS: 4.0000 mg | ORAL_TABLET | Freq: Four times a day (QID) | ORAL | Status: DC | PRN

## 2022-10-22 MED ORDER — HEPARIN (PORCINE) 25000 UT/250ML-% IV SOLN
1150.0000 [IU]/h | INTRAVENOUS | Status: DC
  Administered 2022-10-22: 1000 [IU]/h via INTRAVENOUS
  Administered 2022-10-23 – 2022-10-24 (×2): 1150 [IU]/h via INTRAVENOUS
  Filled 2022-10-22 (×3): qty 250

## 2022-10-22 MED ORDER — ROSUVASTATIN CALCIUM 10 MG PO TABS: 10.0000 mg | ORAL_TABLET | ORAL | Status: DC

## 2022-10-22 MED ORDER — OXYCODONE HCL 5 MG PO TABS
5.0000 mg | ORAL_TABLET | ORAL | Status: DC | PRN
  Administered 2022-10-23 – 2022-10-27 (×13): 5 mg via ORAL
  Filled 2022-10-22 (×13): qty 1

## 2022-10-22 MED ORDER — ACETAMINOPHEN 325 MG PO TABS
650.0000 mg | ORAL_TABLET | Freq: Once | ORAL | Status: AC
  Administered 2022-10-22: 650 mg via ORAL
  Filled 2022-10-22: qty 2

## 2022-10-22 NOTE — ED Provider Notes (Signed)
EMERGENCY DEPARTMENT AT [facility] Provider Note   CSN: [id number] Arrival date & time: 10/22/22  1040     History  Chief Complaint  Patient presents with   Back Pain   Abdominal Pain    Erin WAPLE is a 77 y.o. female.  Patient with history of hypertension, diabetes, hypothyroidism on Synthroid presents today with complaints of back pain and abdominal pain.  She states that same began initially on Friday and has been persistent since then.  She states that most of her pain is localized in her right upper back area.  Pain gets better with direct pressure.  Also notes that she is having generalized abdominal pain that seems to be worsening.  She has been having fewer bowel movements and some hard stools that require straining and therefore thought she was constipated.  She did recently start Rybelsus and read this could be a complication.  She has started taking 1 capful a day of MiraLAX with minimal improvement.  She does note that she has had a few intermittent episodes of chest pain without shortness of breath.  Denies nausea or vomiting.  No history of similar symptoms previously.  She did smoke cigarettes for many years but quit many years ago.  She denies any history of heart arrhythmias and is not on anticoagulation.  She has never seen a cardiologist before.  She does note that she has taken her blood pressure medication today.  The history is provided by the patient. No language interpreter was used.  Back Pain Associated symptoms: abdominal pain   Abdominal Pain      Home Medications Prior to Admission medications   Medication Sig Start Date End Date Taking? Authorizing Provider  acetaminophen (TYLENOL) 500 MG tablet Take 500 mg by mouth every 6 (six) hours as needed for moderate pain (pain score 4-6).   Yes [provider]  ALPRAZolam Prudy Feeler) 0.5 MG tablet Take 0.5 mg by mouth at bedtime as needed for anxiety.   Yes [provider]  cyanocobalamin (,VITAMIN B-12,) 1000 MCG/ML injection Inject 1,000 mcg into the muscle every 30 (thirty) days.   Yes [provider]  enalapril (VASOTEC) 5 MG tablet Take 5 mg by mouth daily.   Yes [provider]  famotidine (PEPCID) 20 MG tablet Take 20 mg by mouth 2 (two) times daily as needed. 10/17/22  Yes [provider]  fenofibrate micronized (LOFIBRA) 134 MG capsule Take 134 mg by mouth daily before breakfast.   Yes [provider]  fexofenadine (ALLEGRA) 180 MG tablet Take 180 mg by mouth daily as needed for allergies or rhinitis.   Yes [provider]  glimepiride (AMARYL) 4 MG tablet Take 4 mg by mouth 2 (two) times daily.   Yes [provider]  insulin glargine (LANTUS) 100 UNIT/ML injection Inject 40 Units into the skin at bedtime as needed (high blood sugar).   Yes [provider]  levothyroxine (SYNTHROID, LEVOTHROID) 137 MCG tablet Take 137 mcg by mouth daily before breakfast.   Yes [provider]  metFORMIN (GLUCOPHAGE) 500 MG tablet Take 500 mg by mouth 2 (two) times daily with a meal.   Yes [provider]  Omega-3 Fatty Acids (FISH OIL) 1200 MG CAPS Take 1 capsule by mouth daily.    Yes [provider]  rosuvastatin (CRESTOR) 10 MG tablet Take 10 mg by mouth once a week. 09/13/22  Yes [provider]  traMADol (ULTRAM) 50 MG tablet Take  by mouth every 6 (six) hours as needed for moderate pain (pain score 4-6).   Yes [provider]      Allergies    Patient has no known allergies.    Review of Systems   Review of Systems  Gastrointestinal:  Positive for abdominal pain.  Musculoskeletal:  Positive for back pain.  All other systems reviewed and are negative.   Physical Exam Updated Vital Signs BP (!) 177/108   Pulse (!) 59   Temp 97.7 F (36.5 C) (Oral)   Resp 16   Ht 5\' 9"  (1.753 m)   Wt 81.6 kg   SpO2 99%   BMI 26.58 kg/m  Physical  Exam Vitals and nursing note reviewed.  Constitutional:      General: She is not in acute distress.    Appearance: Normal appearance. She is normal weight. She is not ill-appearing, toxic-appearing or diaphoretic.  HENT:     Head: Normocephalic and atraumatic.  Cardiovascular:     Rate and Rhythm: Normal rate and regular rhythm.     Pulses:          Dorsalis pedis pulses are 2+ on the right side and 2+ on the left side.       Posterior tibial pulses are 2+ on the right side and 2+ on the left side.     Heart sounds: Normal heart sounds.  Pulmonary:     Effort: Pulmonary effort is normal. No respiratory distress.     Breath sounds: Normal breath sounds.  Abdominal:     General: Abdomen is flat.     Palpations: Abdomen is soft.     Tenderness: There is generalized abdominal tenderness.  Musculoskeletal:        General: Normal range of motion.       Arms:     Cervical back: Normal range of motion.  Skin:    General: Skin is warm and dry.  Neurological:     General: No focal deficit present.     Mental Status: She is alert.  Psychiatric:        Mood and Affect: Mood normal.        Behavior: Behavior normal.     ED Results / Procedures / Treatments   Labs (all labs ordered are listed, but only abnormal results are displayed) Labs Reviewed  COMPREHENSIVE METABOLIC PANEL - Abnormal; Notable for the following components:      Result Value   Sodium 134 (*)    Glucose, Bld 324 (*)    Creatinine, Ser 1.01 (*)    AST 74 (*)    Total Bilirubin 2.0 (*)    GFR, Estimated 57 (*)    All other components within normal limits  CBC - Abnormal; Notable for the following components:   WBC 10.9 (*)    RBC 5.24 (*)    All other components within normal limits  URINALYSIS, ROUTINE W REFLEX MICROSCOPIC - Abnormal; Notable for the following components:   Color, Urine AMBER (*)    APPearance HAZY (*)    Glucose, UA 50 (*)    Protein, ur 100 (*)    Bacteria, UA RARE (*)    All other  components within normal limits  LIPASE, BLOOD  TROPONIN I (HIGH SENSITIVITY)  TROPONIN I (HIGH SENSITIVITY)    EKG None  Radiology CT Angio Chest/Abd/Pel for Dissection W and/or Wo Contrast  Result Date: 10/22/2022 CLINICAL DATA:  Right upper abdominal pain radiating to the back EXAM: CT ANGIOGRAPHY CHEST,  ABDOMEN AND PELVIS TECHNIQUE: Non-contrast CT of the chest was initially obtained. Multidetector CT imaging through the chest, abdomen and pelvis was performed using the standard protocol during bolus administration of intravenous contrast. Multiplanar reconstructed images and MIPs were obtained and reviewed to evaluate the vascular anatomy. RADIATION DOSE REDUCTION: This exam was performed according to the departmental dose-optimization program which includes automated exposure control, adjustment of the mA and/or kV according to patient size and/or use of iterative reconstruction technique. CONTRAST:  OMNIPAQUE IOHEXOL 300 MG/ML  SOLN COMPARISON:  CT 04/28/2015 FINDINGS: CTA CHEST FINDINGS Cardiovascular: Non contrasted images of the chest demonstrate no acute intramural hematoma. Nonaneurysmal aorta. Normal cardiac size. No pericardial effusion. No dissection. Small amount of aortic atherosclerosis. Minimal coronary vascular calcification. Mediastinum/Nodes: Midline trachea. No thyroid mass. No suspicious lymph nodes. Esophagus within normal limits. Lungs/Pleura: Emphysema. No acute airspace disease, pleural effusion or pneumothorax. Scattered pulmonary nodules, for example 3 mm left apical lung nodule on series 7, image 37. Right apical ground-glass pulmonary nodule measuring 7 mm on series 7, image 49. Musculoskeletal: No acute or suspicious osseous abnormality Review of the MIP images confirms the above findings. CTA ABDOMEN AND PELVIS FINDINGS VASCULAR Aorta: Normal caliber aorta without aneurysm, dissection, vasculitis or significant stenosis. Celiac: Patent without evidence of  aneurysm, dissection, vasculitis or significant stenosis. SMA: Patent without evidence of aneurysm, dissection, vasculitis or significant stenosis. Renals: Both renal arteries are patent without evidence of aneurysm, dissection, vasculitis, fibromuscular dysplasia or significant stenosis. IMA: Patent without evidence of aneurysm, dissection, vasculitis or significant stenosis. Inflow: Patent without evidence of aneurysm, dissection, vasculitis or significant stenosis. Veins: Not adequately assessed Review of the MIP images confirms the above findings. NON-VASCULAR Hepatobiliary: Status post cholecystectomy CBD measures 8 mm, within normal limits for post cholecystectomy patient. Poorly defined heterogenous mass is suspected in the left hepatic lobe. Somewhat wedge-shaped hypodensity at the left hepatic lobe more superior could reflect altered perfusion. Pancreas: Unremarkable. No pancreatic ductal dilatation or surrounding inflammatory changes. Spleen: Normal in size without focal abnormality. Adrenals/Urinary Tract: Adrenal glands are unremarkable. Kidneys are normal, without renal calculi, focal lesion, or hydronephrosis. Bladder is unremarkable. Stomach/Bowel: Stomach is within normal limits. Appendix appears normal. No evidence of bowel wall thickening, distention, or inflammatory changes. Lymphatic: 13 mm portal caval node on series 5, image 121. 7 mm distal right Peri aortic node on series 5, image 145. Small subcentimeter lymph nodes in the anterior cardio phrenic fat. These measure up to 11 mm on coronal series 11, image 30. Reproductive: Hysterectomy.  No adnexal mass Other: Negative for pelvic effusion or free air. Musculoskeletal: No acute or suspicious osseous abnormality Review of the MIP images confirms the above findings. IMPRESSION: 1. Negative for acute aortic dissection or aneurysm. 2. Suspicion of a defined heterogenous mass is suspected in the left hepatic lobe, but limited assessment secondary  to phase of contrast enhancement. Further evaluation with MRI is suggested. 3. Mildly enlarged portal caval node and small lymph nodes in the anterior cardiophrenic fat, nonspecific. 4. Emphysema. Scattered pulmonary nodules, largest is a 7 mm right apical ground-glass nodule, these are potentially not incidental given the findings in the liver. 5. Aortic atherosclerosis. Aortic Atherosclerosis (ICD10-I70.0) and Emphysema (ICD10-J43.9). Electronically Signed   By: Jasmine Pang M.D.   On: 10/22/2022 20:24    Procedures .Critical Care  Performed by: Silva Bandy, PA-C Authorized by: Silva Bandy, PA-C   Critical care provider statement:    Critical care time (minutes):  75  Critical care was necessary to treat or prevent imminent or life-threatening deterioration of the following conditions:  Circulatory failure (new afib, bradycardia rates in the 40s, concern for new liver cancer with lung mets)   Critical care was time spent personally by me on the following activities:  Development of treatment plan with patient or surrogate, discussions with consultants, discussions with primary provider, evaluation of patient's response to treatment, examination of patient, obtaining history from patient or surrogate, ordering and review of laboratory studies, ordering and review of radiographic studies, pulse oximetry, re-evaluation of patient's condition and review of old charts   Care discussed with: admitting provider       Medications Ordered in ED Medications  iohexol (OMNIPAQUE) 300 MG/ML solution 100 mL (100 mLs Intravenous Contrast Given 10/22/22 1721)  acetaminophen (TYLENOL) tablet 650 mg (650 mg Oral Given 10/22/22 2021)  HYDROcodone-acetaminophen (NORCO/VICODIN) 5-325 MG per tablet 1 tablet (1 tablet Oral Given 10/22/22 2229)    ED Course/ Medical Decision Making/ A&P                                 Medical Decision Making Amount and/or Complexity of Data Reviewed Labs:  ordered. Radiology: ordered.  Risk OTC drugs. Prescription drug management.   This patient is a 77 y.o. female who presents to the ED for concern of abdominal pain, back pain, this involves an extensive number of treatment options, and is a complaint that carries with it a high risk of complications and morbidity. The emergent differential diagnosis prior to evaluation includes, but is not limited to,  PE/ACS, pneumonia, pneumothorax, AAA, gastroenteritis, appendicitis, Bowel obstruction, Bowel perforation. Gastroparesis, DKA, Hernia, Inflammatory bowel disease, mesenteric ischemia, pancreatitis, peritonitis SBP, volvulus.   This is not an exhaustive differential.   Past Medical History / Co-morbidities / Social History:  has a past medical history of Anemia, Anxiety, Diabetes mellitus, H/O hiatal hernia, Hypertension, and Hypothyroidism.  Additional history: Chart reviewed. Pertinent results include: no previous cardiology evaluation  Physical Exam: Physical exam performed. The pertinent findings include: generalized abdominal tenderness  Lab Tests: I ordered, and personally interpreted labs.  The pertinent results include:  WBC 10.9, Na 134, glucose 324, creatinine 1.01, t bili 2.0, alk phos 74.  Delta troponin negative.  TSH and free T4 pending   Imaging Studies: I ordered imaging studies including CTA chest abdomen pelvis. I independently visualized and interpreted imaging which showed   1. Negative for acute aortic dissection or aneurysm. 2. Suspicion of a defined heterogenous mass is suspected in the left hepatic lobe, but limited assessment secondary to phase of contrast enhancement. Further evaluation with MRI is suggested. 3. Mildly enlarged portal caval node and small lymph nodes in the anterior cardiophrenic fat, nonspecific. 4. Emphysema. Scattered pulmonary nodules, largest is a 7 mm right apical ground-glass nodule, these are potentially not incidental given the  findings in the liver. 5. Aortic atherosclerosis.  I agree with the radiologist interpretation.   Cardiac Monitoring:  The patient was maintained on a cardiac monitor.  My attending physician Dr. Deretha Emory viewed and interpreted the cardiac monitored which showed an underlying rhythm of: afib rates in the 40s. I agree with this interpretation.   Medications: I ordered medication including Tylenol, Norco for pain.  Heparin for new A-fib given also concerns for new metastatic disease that may need a biopsy inpatient.  Reevaluation of the patient after these medicines showed that the patient improved. I have  reviewed the patients home medicines and have made adjustments as needed.  Consultations Obtained: I requested consultation with the cardiology on call Dr. Algie Coffer,  and discussed lab and imaging findings as well as pertinent plan - they recommend: admission for new afib with bradycardia, rates in the 40s. Thyroid studies pending. Plan to keep at [facility], however if rates drop into the 30s may need transfer for pacemaker. Recommend heparin IV for anticoagulation given she may need a biopsy inpatient for liver mass   Disposition: After consideration of the diagnostic results and the patients response to treatment, I feel that patient will require admission for new A-fib with rates in the 40s as well as CT findings concerning for new hepatic mass and pulmonary nodules I have discussed these findings with the patient who expressed understanding and is in agreement with this plan.  Discussed patient with hospitalist Dr. Carren Rang who accepts patient for admission.  I discussed this case with my attending physician Dr. Deretha Emory who cosigned this note including patient's presenting symptoms, physical exam, and planned diagnostics and interventions. Attending physician stated agreement with plan or made changes to plan which were implemented.    Final Clinical Impression(s) / ED  Diagnoses Final diagnoses:  Bradycardia  New onset a-fib [facility])  Pulmonary nodule, right  Liver mass, left lobe    Rx / DC Orders ED Discharge Orders     None         Vear Clock 10/22/22 2242    Vanetta Mulders, MD 10/26/22 [phone]

## 2022-10-22 NOTE — Progress Notes (Signed)
PHARMACY - ANTICOAGULATION CONSULT NOTE  Pharmacy Consult for IV Heparin  Indication: afib  No Known Allergies  Patient Measurements: Height: 5\' 9"  (175.3 cm) Weight: 81.6 kg (180 lb) IBW/kg (Calculated) : 66.2 Heparin Dosing Weight: 81.6 kg  Vital Signs: Temp: 97.7 F (36.5 C) (10/14 1650) Temp Source: Oral (10/14 1650) BP: 162/84 (10/14 2145) Pulse Rate: 42 (10/14 2145)  Labs: Recent Labs    10/22/22 1221 10/22/22 1733 10/22/22 1855  HGB 14.5  --   --   HCT 45.5  --   --   PLT 251  --   --   CREATININE 1.01*  --   --   TROPONINIHS  --  7 8    Estimated Creatinine Clearance: 51.6 mL/min (A) (by C-G formula based on SCr of 1.01 mg/dL (H)).   Medical History: Past Medical History:  Diagnosis Date   Anemia    Anxiety    Diabetes mellitus    H/O hiatal hernia    Hypertension    Hypothyroidism    Assessment: Erin Hoffman is a 77 y.o. year old female admitted on 10/22/2022 with concern for new afib. No anticoagulation prior to admission. Pharmacy consulted to dose heparin.  Goal of Therapy:  Heparin level 0.3-0.7 units/ml Monitor platelets by anticoagulation protocol: Yes   Plan:  Heparin 4000 units x 1 as bolus followed by heparin infusion at 1000 units/hr 8 heparin level  Daily heparin level, CBC, and monitoring for bleeding F/u plans for anticoagulation   Thank you for allowing pharmacy to participate in this patient's care.  Marja Kays, PharmD Emergency Medicine Clinical Pharmacist 10/22/2022,10:36 PM

## 2022-10-22 NOTE — ED Triage Notes (Signed)
Back pain  RIGHT ribs  Pt stated that she has been constipated due to home medication. Pt stated she has been passing gas, minimal stool, mucus like.

## 2022-10-22 NOTE — ED Notes (Signed)
Back from CT

## 2022-10-22 NOTE — ED Notes (Signed)
Not in room, taken to CT

## 2022-10-23 ENCOUNTER — Observation Stay (HOSPITAL_COMMUNITY): Payer: HMO

## 2022-10-23 DIAGNOSIS — I700 Atherosclerosis of aorta: Secondary | ICD-10-CM | POA: Diagnosis not present

## 2022-10-23 DIAGNOSIS — Z8249 Family history of ischemic heart disease and other diseases of the circulatory system: Secondary | ICD-10-CM | POA: Diagnosis not present

## 2022-10-23 DIAGNOSIS — N281 Cyst of kidney, acquired: Secondary | ICD-10-CM | POA: Diagnosis not present

## 2022-10-23 DIAGNOSIS — E785 Hyperlipidemia, unspecified: Secondary | ICD-10-CM | POA: Diagnosis not present

## 2022-10-23 DIAGNOSIS — E119 Type 2 diabetes mellitus without complications: Secondary | ICD-10-CM

## 2022-10-23 DIAGNOSIS — K219 Gastro-esophageal reflux disease without esophagitis: Secondary | ICD-10-CM | POA: Insufficient documentation

## 2022-10-23 DIAGNOSIS — Z90710 Acquired absence of both cervix and uterus: Secondary | ICD-10-CM | POA: Diagnosis not present

## 2022-10-23 DIAGNOSIS — Z8261 Family history of arthritis: Secondary | ICD-10-CM | POA: Diagnosis not present

## 2022-10-23 DIAGNOSIS — I10 Essential (primary) hypertension: Secondary | ICD-10-CM | POA: Insufficient documentation

## 2022-10-23 DIAGNOSIS — R600 Localized edema: Secondary | ICD-10-CM | POA: Diagnosis not present

## 2022-10-23 DIAGNOSIS — Z79899 Other long term (current) drug therapy: Secondary | ICD-10-CM | POA: Diagnosis not present

## 2022-10-23 DIAGNOSIS — E1165 Type 2 diabetes mellitus with hyperglycemia: Secondary | ICD-10-CM | POA: Diagnosis not present

## 2022-10-23 DIAGNOSIS — C787 Secondary malignant neoplasm of liver and intrahepatic bile duct: Secondary | ICD-10-CM | POA: Diagnosis not present

## 2022-10-23 DIAGNOSIS — R590 Localized enlarged lymph nodes: Secondary | ICD-10-CM | POA: Diagnosis not present

## 2022-10-23 DIAGNOSIS — I4891 Unspecified atrial fibrillation: Secondary | ICD-10-CM

## 2022-10-23 DIAGNOSIS — Z87891 Personal history of nicotine dependence: Secondary | ICD-10-CM | POA: Diagnosis not present

## 2022-10-23 DIAGNOSIS — R001 Bradycardia, unspecified: Secondary | ICD-10-CM | POA: Diagnosis not present

## 2022-10-23 DIAGNOSIS — R160 Hepatomegaly, not elsewhere classified: Secondary | ICD-10-CM | POA: Insufficient documentation

## 2022-10-23 DIAGNOSIS — Z794 Long term (current) use of insulin: Secondary | ICD-10-CM

## 2022-10-23 DIAGNOSIS — E039 Hypothyroidism, unspecified: Secondary | ICD-10-CM | POA: Insufficient documentation

## 2022-10-23 DIAGNOSIS — R911 Solitary pulmonary nodule: Secondary | ICD-10-CM | POA: Diagnosis not present

## 2022-10-23 DIAGNOSIS — M549 Dorsalgia, unspecified: Secondary | ICD-10-CM | POA: Diagnosis not present

## 2022-10-23 DIAGNOSIS — Z833 Family history of diabetes mellitus: Secondary | ICD-10-CM | POA: Diagnosis not present

## 2022-10-23 DIAGNOSIS — Z79890 Hormone replacement therapy (postmenopausal): Secondary | ICD-10-CM | POA: Diagnosis not present

## 2022-10-23 DIAGNOSIS — M79662 Pain in left lower leg: Secondary | ICD-10-CM | POA: Diagnosis not present

## 2022-10-23 DIAGNOSIS — F419 Anxiety disorder, unspecified: Secondary | ICD-10-CM | POA: Diagnosis not present

## 2022-10-23 DIAGNOSIS — Z807 Family history of other malignant neoplasms of lymphoid, hematopoietic and related tissues: Secondary | ICD-10-CM | POA: Diagnosis not present

## 2022-10-23 DIAGNOSIS — K30 Functional dyspepsia: Secondary | ICD-10-CM | POA: Diagnosis not present

## 2022-10-23 DIAGNOSIS — J439 Emphysema, unspecified: Secondary | ICD-10-CM | POA: Diagnosis not present

## 2022-10-23 DIAGNOSIS — R1011 Right upper quadrant pain: Secondary | ICD-10-CM | POA: Diagnosis not present

## 2022-10-23 DIAGNOSIS — R918 Other nonspecific abnormal finding of lung field: Secondary | ICD-10-CM | POA: Diagnosis not present

## 2022-10-23 DIAGNOSIS — Z9049 Acquired absence of other specified parts of digestive tract: Secondary | ICD-10-CM | POA: Diagnosis not present

## 2022-10-23 DIAGNOSIS — C229 Malignant neoplasm of liver, not specified as primary or secondary: Secondary | ICD-10-CM | POA: Diagnosis not present

## 2022-10-23 DIAGNOSIS — Z7984 Long term (current) use of oral hypoglycemic drugs: Secondary | ICD-10-CM | POA: Diagnosis not present

## 2022-10-23 DIAGNOSIS — C801 Malignant (primary) neoplasm, unspecified: Secondary | ICD-10-CM | POA: Diagnosis not present

## 2022-10-23 DIAGNOSIS — M79661 Pain in right lower leg: Secondary | ICD-10-CM | POA: Diagnosis not present

## 2022-10-23 DIAGNOSIS — Z808 Family history of malignant neoplasm of other organs or systems: Secondary | ICD-10-CM | POA: Diagnosis not present

## 2022-10-23 DIAGNOSIS — K5900 Constipation, unspecified: Secondary | ICD-10-CM | POA: Diagnosis not present

## 2022-10-23 DIAGNOSIS — C221 Intrahepatic bile duct carcinoma: Secondary | ICD-10-CM | POA: Diagnosis not present

## 2022-10-23 LAB — COMPREHENSIVE METABOLIC PANEL
ALT: 48 U/L — ABNORMAL HIGH (ref 0–44)
AST: 67 U/L — ABNORMAL HIGH (ref 15–41)
Albumin: 3.9 g/dL (ref 3.5–5.0)
Alkaline Phosphatase: 65 U/L (ref 38–126)
Anion gap: 9 (ref 5–15)
BUN: 13 mg/dL (ref 8–23)
CO2: 23 mmol/L (ref 22–32)
Calcium: 8.9 mg/dL (ref 8.9–10.3)
Chloride: 102 mmol/L (ref 98–111)
Creatinine, Ser: 0.72 mg/dL (ref 0.44–1.00)
GFR, Estimated: >60 mL/min (ref >60)
Glucose, Bld: 213 mg/dL — ABNORMAL HIGH (ref 70–99)
Potassium: 3.5 mmol/L (ref 3.5–5.1)
Sodium: 134 mmol/L — ABNORMAL LOW (ref 135–145)
Total Bilirubin: 1.2 mg/dL (ref 0.3–1.2)
Total Protein: 7.4 g/dL (ref 6.5–8.1)

## 2022-10-23 LAB — GLUCOSE, CAPILLARY
Glucose-Capillary: 210 mg/dL — ABNORMAL HIGH (ref 70–99)
Glucose-Capillary: 188 mg/dL — ABNORMAL HIGH (ref 70–99)
Glucose-Capillary: 202 mg/dL — ABNORMAL HIGH (ref 70–99)
Glucose-Capillary: 114 mg/dL — ABNORMAL HIGH (ref 70–99)

## 2022-10-23 LAB — CBC WITH DIFFERENTIAL/PLATELET
Abs Immature Granulocytes: 0.03 10*3/uL (ref 0.00–0.07)
Basophils Absolute: 0 10*3/uL (ref 0.0–0.1)
Basophils Relative: 0 %
Eosinophils Absolute: 0.2 10*3/uL (ref 0.0–0.5)
Eosinophils Relative: 2 %
HCT: 40 % (ref 36.0–46.0)
Hemoglobin: 13.4 g/dL (ref 12.0–15.0)
Immature Granulocytes: 0 %
Lymphocytes Relative: 18 %
Lymphs Abs: 1.5 10*3/uL (ref 0.7–4.0)
MCH: 28.3 pg (ref 26.0–34.0)
MCHC: 33.5 g/dL (ref 30.0–36.0)
MCV: 84.6 fL (ref 80.0–100.0)
Monocytes Absolute: 0.5 10*3/uL (ref 0.1–1.0)
Monocytes Relative: 6 %
Neutro Abs: 6.4 10*3/uL (ref 1.7–7.7)
Neutrophils Relative %: 74 %
Platelets: 220 10*3/uL (ref 150–400)
RBC: 4.73 MIL/uL (ref 3.87–5.11)
RDW: 14 % (ref 11.5–15.5)
WBC: 8.5 10*3/uL (ref 4.0–10.5)
nRBC: 0 % (ref 0.0–0.2)

## 2022-10-23 LAB — ECHOCARDIOGRAM COMPLETE
AR max vel: 2.1 cm2
AV Area VTI: 2.1 cm2
AV Area mean vel: 2.14 cm2
AV Mean grad: 4.5 mm[Hg]
AV Peak grad: 10.4 mm[Hg]
Ao pk vel: 1.61 m/s
Area-P 1/2: 3.06 cm2
Height: 69 in
MV VTI: 2.56 cm2
S' Lateral: 3.7 cm
Weight: 2896 [oz_av]

## 2022-10-23 LAB — HEPARIN LEVEL (UNFRACTIONATED)
Heparin Unfractionated: 0.31 [IU]/mL (ref 0.30–0.70)
Heparin Unfractionated: 0.24 [IU]/mL — ABNORMAL LOW (ref 0.30–0.70)
Heparin Unfractionated: 0.32 [IU]/mL (ref 0.30–0.70)

## 2022-10-23 LAB — HEMOGLOBIN A1C
Hgb A1c MFr Bld: 8.8 % — ABNORMAL HIGH (ref 4.8–5.6)
Mean Plasma Glucose: 205.86 mg/dL

## 2022-10-23 LAB — MAGNESIUM: Magnesium: 1.6 mg/dL — ABNORMAL LOW (ref 1.7–2.4)

## 2022-10-23 LAB — T4, FREE: Free T4: 1.85 ng/dL — ABNORMAL HIGH (ref 0.61–1.12)

## 2022-10-23 LAB — CBG MONITORING, ED: Glucose-Capillary: 220 mg/dL — ABNORMAL HIGH (ref 70–99)

## 2022-10-23 MED ORDER — ENALAPRIL MALEATE 10 MG PO TABS
5.0000 mg | ORAL_TABLET | Freq: Every day | ORAL | Status: DC
  Administered 2022-10-23 – 2022-10-27 (×4): 5 mg via ORAL
  Filled 2022-10-23 (×5): qty 1

## 2022-10-23 MED ORDER — INSULIN ASPART 100 UNIT/ML IJ SOLN
4.0000 [IU] | Freq: Three times a day (TID) | INTRAMUSCULAR | Status: DC
  Administered 2022-10-23 – 2022-10-27 (×5): 4 [IU] via SUBCUTANEOUS

## 2022-10-23 MED ORDER — HYDRALAZINE HCL 25 MG PO TABS: 25.0000 mg | ORAL_TABLET | Freq: Four times a day (QID) | ORAL | Status: DC | PRN

## 2022-10-23 MED ORDER — PANTOPRAZOLE SODIUM 40 MG PO TBEC
40.0000 mg | DELAYED_RELEASE_TABLET | Freq: Every day | ORAL | Status: DC
  Administered 2022-10-23 – 2022-10-27 (×4): 40 mg via ORAL
  Filled 2022-10-23 (×5): qty 1

## 2022-10-23 MED ORDER — MAGNESIUM SULFATE 2 GM/50ML IV SOLN
2.0000 g | Freq: Once | INTRAVENOUS | Status: AC
  Administered 2022-10-23: 2 g via INTRAVENOUS
  Filled 2022-10-23: qty 50

## 2022-10-23 NOTE — Progress Notes (Signed)
Invalid input(s): "PROCALCITONIN", "LACTICIDVEN"  Microbiology: No results found for this or any previous visit (from the past 240 hour(s)).  Radiology Studies: ECHOCARDIOGRAM COMPLETE  Result Date: 10/23/2022    ECHOCARDIOGRAM REPORT   Patient Name:   Erin Hoffman Date of Exam: 10/23/2022 Medical Rec #:  [id number]          Height:       69.0 in Accession #:    [id number]         Weight:       181.0 lb Date of Birth:  1945/06/08          BSA:          1.980 m Patient Age:    77 years           BP:           195/93 mmHg Patient Gender: F                  HR:           45 bpm. [facility] Procedure: 2D Echo, Cardiac Doppler and Color Doppler Indications:    Atrial Fibrillation  History:        Patient has no prior history of Echocardiogram examinations.                 Arrythmias:Atrial Fibrillation; Risk Factors:Hypertension,                 Diabetes and Dyslipidemia.   Sonographer:    Mikki Harbor Referring Phys: [id number] ASIA B ZIERLE-GHOSH  Sonographer Comments: Image acquisition challenging due to respiratory motion. IMPRESSIONS  1. Left ventricular ejection fraction, by estimation, is 60 to 65%. The left ventricle has normal function. The left ventricle has no regional wall motion abnormalities. There is mild left ventricular hypertrophy. Left ventricular diastolic parameters are indeterminate.  2. Right ventricular systolic function is normal. The right ventricular size is normal. There is mildly elevated pulmonary artery systolic pressure.  3. Left atrial size was mildly dilated.  4. The mitral valve is abnormal. Mild mitral valve regurgitation. No evidence of mitral stenosis.  5. The tricuspid valve is abnormal. Tricuspid valve regurgitation is mild to moderate.  6. The aortic valve is tricuspid. There is mild calcification of the aortic valve. There is mild thickening of the aortic valve. Aortic valve regurgitation is not visualized. No aortic stenosis is present.  7. The inferior vena cava is normal in size with greater than 50% respiratory variability, suggesting right atrial pressure of 3 mmHg. FINDINGS  Left Ventricle: Left ventricular ejection fraction, by estimation, is 60 to 65%. The left ventricle has normal function. The left ventricle has no regional wall motion abnormalities. The left ventricular internal cavity size was normal in size. There is  mild left ventricular hypertrophy. Left ventricular diastolic parameters are indeterminate. Right Ventricle: The right ventricular size is normal. Right vetricular wall thickness was not well visualized. Right ventricular systolic function is normal. There is mildly elevated pulmonary artery systolic pressure. The tricuspid regurgitant velocity  is 3.02 m/s, and with an assumed right atrial pressure of 8 mmHg, the estimated right ventricular systolic pressure is 44.5 mmHg. Left Atrium: Left atrial size was mildly  dilated. Right Atrium: Right atrial size was normal in size. Pericardium: There is no evidence of pericardial effusion. Mitral Valve: The mitral valve is abnormal. Mild mitral valve regurgitation. No evidence of mitral valve stenosis. MV peak gradient, 3.8 mmHg. The mean mitral valve gradient  Invalid input(s): "PROCALCITONIN", "LACTICIDVEN"  Microbiology: No results found for this or any previous visit (from the past 240 hour(s)).  Radiology Studies: ECHOCARDIOGRAM COMPLETE  Result Date: 10/23/2022    ECHOCARDIOGRAM REPORT   Patient Name:   Erin Hoffman Date of Exam: 10/23/2022 Medical Rec #:  [id number]          Height:       69.0 in Accession #:    [id number]         Weight:       181.0 lb Date of Birth:  1945/06/08          BSA:          1.980 m Patient Age:    77 years           BP:           195/93 mmHg Patient Gender: F                  HR:           45 bpm. [facility] Procedure: 2D Echo, Cardiac Doppler and Color Doppler Indications:    Atrial Fibrillation  History:        Patient has no prior history of Echocardiogram examinations.                 Arrythmias:Atrial Fibrillation; Risk Factors:Hypertension,                 Diabetes and Dyslipidemia.   Sonographer:    Mikki Harbor Referring Phys: [id number] ASIA B ZIERLE-GHOSH  Sonographer Comments: Image acquisition challenging due to respiratory motion. IMPRESSIONS  1. Left ventricular ejection fraction, by estimation, is 60 to 65%. The left ventricle has normal function. The left ventricle has no regional wall motion abnormalities. There is mild left ventricular hypertrophy. Left ventricular diastolic parameters are indeterminate.  2. Right ventricular systolic function is normal. The right ventricular size is normal. There is mildly elevated pulmonary artery systolic pressure.  3. Left atrial size was mildly dilated.  4. The mitral valve is abnormal. Mild mitral valve regurgitation. No evidence of mitral stenosis.  5. The tricuspid valve is abnormal. Tricuspid valve regurgitation is mild to moderate.  6. The aortic valve is tricuspid. There is mild calcification of the aortic valve. There is mild thickening of the aortic valve. Aortic valve regurgitation is not visualized. No aortic stenosis is present.  7. The inferior vena cava is normal in size with greater than 50% respiratory variability, suggesting right atrial pressure of 3 mmHg. FINDINGS  Left Ventricle: Left ventricular ejection fraction, by estimation, is 60 to 65%. The left ventricle has normal function. The left ventricle has no regional wall motion abnormalities. The left ventricular internal cavity size was normal in size. There is  mild left ventricular hypertrophy. Left ventricular diastolic parameters are indeterminate. Right Ventricle: The right ventricular size is normal. Right vetricular wall thickness was not well visualized. Right ventricular systolic function is normal. There is mildly elevated pulmonary artery systolic pressure. The tricuspid regurgitant velocity  is 3.02 m/s, and with an assumed right atrial pressure of 8 mmHg, the estimated right ventricular systolic pressure is 44.5 mmHg. Left Atrium: Left atrial size was mildly  dilated. Right Atrium: Right atrial size was normal in size. Pericardium: There is no evidence of pericardial effusion. Mitral Valve: The mitral valve is abnormal. Mild mitral valve regurgitation. No evidence of mitral valve stenosis. MV peak gradient, 3.8 mmHg. The mean mitral valve gradient  Invalid input(s): "PROCALCITONIN", "LACTICIDVEN"  Microbiology: No results found for this or any previous visit (from the past 240 hour(s)).  Radiology Studies: ECHOCARDIOGRAM COMPLETE  Result Date: 10/23/2022    ECHOCARDIOGRAM REPORT   Patient Name:   Erin Hoffman Date of Exam: 10/23/2022 Medical Rec #:  [id number]          Height:       69.0 in Accession #:    [id number]         Weight:       181.0 lb Date of Birth:  1945/06/08          BSA:          1.980 m Patient Age:    77 years           BP:           195/93 mmHg Patient Gender: F                  HR:           45 bpm. [facility] Procedure: 2D Echo, Cardiac Doppler and Color Doppler Indications:    Atrial Fibrillation  History:        Patient has no prior history of Echocardiogram examinations.                 Arrythmias:Atrial Fibrillation; Risk Factors:Hypertension,                 Diabetes and Dyslipidemia.   Sonographer:    Mikki Harbor Referring Phys: [id number] ASIA B ZIERLE-GHOSH  Sonographer Comments: Image acquisition challenging due to respiratory motion. IMPRESSIONS  1. Left ventricular ejection fraction, by estimation, is 60 to 65%. The left ventricle has normal function. The left ventricle has no regional wall motion abnormalities. There is mild left ventricular hypertrophy. Left ventricular diastolic parameters are indeterminate.  2. Right ventricular systolic function is normal. The right ventricular size is normal. There is mildly elevated pulmonary artery systolic pressure.  3. Left atrial size was mildly dilated.  4. The mitral valve is abnormal. Mild mitral valve regurgitation. No evidence of mitral stenosis.  5. The tricuspid valve is abnormal. Tricuspid valve regurgitation is mild to moderate.  6. The aortic valve is tricuspid. There is mild calcification of the aortic valve. There is mild thickening of the aortic valve. Aortic valve regurgitation is not visualized. No aortic stenosis is present.  7. The inferior vena cava is normal in size with greater than 50% respiratory variability, suggesting right atrial pressure of 3 mmHg. FINDINGS  Left Ventricle: Left ventricular ejection fraction, by estimation, is 60 to 65%. The left ventricle has normal function. The left ventricle has no regional wall motion abnormalities. The left ventricular internal cavity size was normal in size. There is  mild left ventricular hypertrophy. Left ventricular diastolic parameters are indeterminate. Right Ventricle: The right ventricular size is normal. Right vetricular wall thickness was not well visualized. Right ventricular systolic function is normal. There is mildly elevated pulmonary artery systolic pressure. The tricuspid regurgitant velocity  is 3.02 m/s, and with an assumed right atrial pressure of 8 mmHg, the estimated right ventricular systolic pressure is 44.5 mmHg. Left Atrium: Left atrial size was mildly  dilated. Right Atrium: Right atrial size was normal in size. Pericardium: There is no evidence of pericardial effusion. Mitral Valve: The mitral valve is abnormal. Mild mitral valve regurgitation. No evidence of mitral valve stenosis. MV peak gradient, 3.8 mmHg. The mean mitral valve gradient  Invalid input(s): "PROCALCITONIN", "LACTICIDVEN"  Microbiology: No results found for this or any previous visit (from the past 240 hour(s)).  Radiology Studies: ECHOCARDIOGRAM COMPLETE  Result Date: 10/23/2022    ECHOCARDIOGRAM REPORT   Patient Name:   Erin Hoffman Date of Exam: 10/23/2022 Medical Rec #:  [id number]          Height:       69.0 in Accession #:    [id number]         Weight:       181.0 lb Date of Birth:  1945/06/08          BSA:          1.980 m Patient Age:    77 years           BP:           195/93 mmHg Patient Gender: F                  HR:           45 bpm. [facility] Procedure: 2D Echo, Cardiac Doppler and Color Doppler Indications:    Atrial Fibrillation  History:        Patient has no prior history of Echocardiogram examinations.                 Arrythmias:Atrial Fibrillation; Risk Factors:Hypertension,                 Diabetes and Dyslipidemia.   Sonographer:    Mikki Harbor Referring Phys: [id number] ASIA B ZIERLE-GHOSH  Sonographer Comments: Image acquisition challenging due to respiratory motion. IMPRESSIONS  1. Left ventricular ejection fraction, by estimation, is 60 to 65%. The left ventricle has normal function. The left ventricle has no regional wall motion abnormalities. There is mild left ventricular hypertrophy. Left ventricular diastolic parameters are indeterminate.  2. Right ventricular systolic function is normal. The right ventricular size is normal. There is mildly elevated pulmonary artery systolic pressure.  3. Left atrial size was mildly dilated.  4. The mitral valve is abnormal. Mild mitral valve regurgitation. No evidence of mitral stenosis.  5. The tricuspid valve is abnormal. Tricuspid valve regurgitation is mild to moderate.  6. The aortic valve is tricuspid. There is mild calcification of the aortic valve. There is mild thickening of the aortic valve. Aortic valve regurgitation is not visualized. No aortic stenosis is present.  7. The inferior vena cava is normal in size with greater than 50% respiratory variability, suggesting right atrial pressure of 3 mmHg. FINDINGS  Left Ventricle: Left ventricular ejection fraction, by estimation, is 60 to 65%. The left ventricle has normal function. The left ventricle has no regional wall motion abnormalities. The left ventricular internal cavity size was normal in size. There is  mild left ventricular hypertrophy. Left ventricular diastolic parameters are indeterminate. Right Ventricle: The right ventricular size is normal. Right vetricular wall thickness was not well visualized. Right ventricular systolic function is normal. There is mildly elevated pulmonary artery systolic pressure. The tricuspid regurgitant velocity  is 3.02 m/s, and with an assumed right atrial pressure of 8 mmHg, the estimated right ventricular systolic pressure is 44.5 mmHg. Left Atrium: Left atrial size was mildly  dilated. Right Atrium: Right atrial size was normal in size. Pericardium: There is no evidence of pericardial effusion. Mitral Valve: The mitral valve is abnormal. Mild mitral valve regurgitation. No evidence of mitral valve stenosis. MV peak gradient, 3.8 mmHg. The mean mitral valve gradient  PROGRESS NOTE  Erin Hoffman YQM:[id number] DOB: Sep 29, 1945   PCP: [name], MD  Patient is from: Home.  Independently ambulates at baseline.  DOA: 10/22/2022 LOS: 0  Chief complaints Chief Complaint  Patient presents with   Back Pain   Abdominal Pain     Brief Narrative / Interim history: 77 year old F with PMH of DM-2, HTN, HLD, hypothyroidism, GERD and former tobacco smoke presenting with epigastric abdominal pain that she describes as indigestion and burping and right-sided back pain for about 3 days, and admitted with new onset atrial fibrillation with bradycardia and liver lesion.  She recently started taking Rybelsus, and had been feeling constipated.  In ED, in A-fib with bradycardia.  No leukocytosis.  Hgb 14.5.  Glucose 324.  Serial troponin negative.  UA negative for UTI CT angio chest/abdomen/pelvis negative for aortic dissection or aneurysm but heterogeneous mass of the left hepatic lobe and mildly enlarged portal caval node with concern for HCC, and MRI is recommended.  Cardiology consulted for A-fib and recommended starting heparin.  Echocardiogram and MRI ordered.  Ordered  Subjective: Seen and examined earlier this morning.  No major events overnight of this morning.  Continues to endorse right-sided back pain.  Pain is worse when she tries to lie on her left side.  No clear association with food.  She denies melena or hematochezia.  Objective: Vitals:   10/23/22 0245 10/23/22 0456 10/23/22 0700 10/23/22 1311  BP: (!) 154/84 (!) 195/93  136/66  Pulse: (!) 50 (!) 58  (!) 52  Resp: 15 18  17   Temp:  97.7 F (36.5 C)  98.2 F (36.8 C)  TempSrc:  Oral  Oral  SpO2: 95% 99%  95%  Weight:   82.1 kg   Height:   5\' 9"  (1.753 m)     Examination:  GENERAL: No apparent distress.  Nontoxic. HEENT: MMM.  Vision and hearing grossly intact.  NECK: Supple.  No apparent JVD.  RESP:  No IWOB.  Fair aeration bilaterally. CVS: Irregular rhythm.  Bradycardic to  50s.  Heart sounds normal.  ABD/GI/GU: BS+. Abd soft, NTND.  MSK/EXT:  Moves extremities. No apparent deformity. No edema.  SKIN: no apparent skin lesion or wound NEURO: Awake, alert and oriented appropriately.  No apparent focal neuro deficit. PSYCH: Calm. Normal affect.   Procedures:  None  Microbiology summarized: None  Assessment and plan: Principal Problem:   Atrial fibrillation (HCC) Active Problems:   Hypothyroidism   DMII (diabetes mellitus, type 2) (HCC)   GERD (gastroesophageal reflux disease)   HLD (hyperlipidemia)   HTN (hypertension)   Liver mass   New onset atrial fibrillation (HCC)  New onset atrial fibrillation with bradycardia: HR as low as 40s.  Currently in 51s.  CHA2DS2-VASc score > 4.  TSH within normal.  TTE without significant finding. -Continue IV heparin -Optimize electrolytes -Continue telemetry  Liver mass: Noted on CT angio chest/abdomen and pelvis.  Reports normal colonoscopy about 9 years ago.  No constitutional symptoms. -Follow MRCP.  -Check AFP, CEA and CA 19-9   Uncontrolled IDDM-2 with hyperglycemia: A1c 8.8%.  On Lantus, glimepiride and metformin at home.  Seems like she recently started Rybelsus which might be contributing to her GI symptoms. Recent Labs  Lab 10/23/22 0002 10/23/22 0738 10/23/22 1109  GLUCAP 220* 210* 188*  -Continue Semglee 20 units at bedtime -Continue SSI-moderate -Start NovoLog 4 units 3 times daily with meals -Hold home glimepiride/metformin. -Further adjustment as appropriate   Essential hypertension: Was hypertensive on  PROGRESS NOTE  Erin Hoffman YQM:[id number] DOB: Sep 29, 1945   PCP: [name], MD  Patient is from: Home.  Independently ambulates at baseline.  DOA: 10/22/2022 LOS: 0  Chief complaints Chief Complaint  Patient presents with   Back Pain   Abdominal Pain     Brief Narrative / Interim history: 77 year old F with PMH of DM-2, HTN, HLD, hypothyroidism, GERD and former tobacco smoke presenting with epigastric abdominal pain that she describes as indigestion and burping and right-sided back pain for about 3 days, and admitted with new onset atrial fibrillation with bradycardia and liver lesion.  She recently started taking Rybelsus, and had been feeling constipated.  In ED, in A-fib with bradycardia.  No leukocytosis.  Hgb 14.5.  Glucose 324.  Serial troponin negative.  UA negative for UTI CT angio chest/abdomen/pelvis negative for aortic dissection or aneurysm but heterogeneous mass of the left hepatic lobe and mildly enlarged portal caval node with concern for HCC, and MRI is recommended.  Cardiology consulted for A-fib and recommended starting heparin.  Echocardiogram and MRI ordered.  Ordered  Subjective: Seen and examined earlier this morning.  No major events overnight of this morning.  Continues to endorse right-sided back pain.  Pain is worse when she tries to lie on her left side.  No clear association with food.  She denies melena or hematochezia.  Objective: Vitals:   10/23/22 0245 10/23/22 0456 10/23/22 0700 10/23/22 1311  BP: (!) 154/84 (!) 195/93  136/66  Pulse: (!) 50 (!) 58  (!) 52  Resp: 15 18  17   Temp:  97.7 F (36.5 C)  98.2 F (36.8 C)  TempSrc:  Oral  Oral  SpO2: 95% 99%  95%  Weight:   82.1 kg   Height:   5\' 9"  (1.753 m)     Examination:  GENERAL: No apparent distress.  Nontoxic. HEENT: MMM.  Vision and hearing grossly intact.  NECK: Supple.  No apparent JVD.  RESP:  No IWOB.  Fair aeration bilaterally. CVS: Irregular rhythm.  Bradycardic to  50s.  Heart sounds normal.  ABD/GI/GU: BS+. Abd soft, NTND.  MSK/EXT:  Moves extremities. No apparent deformity. No edema.  SKIN: no apparent skin lesion or wound NEURO: Awake, alert and oriented appropriately.  No apparent focal neuro deficit. PSYCH: Calm. Normal affect.   Procedures:  None  Microbiology summarized: None  Assessment and plan: Principal Problem:   Atrial fibrillation (HCC) Active Problems:   Hypothyroidism   DMII (diabetes mellitus, type 2) (HCC)   GERD (gastroesophageal reflux disease)   HLD (hyperlipidemia)   HTN (hypertension)   Liver mass   New onset atrial fibrillation (HCC)  New onset atrial fibrillation with bradycardia: HR as low as 40s.  Currently in 51s.  CHA2DS2-VASc score > 4.  TSH within normal.  TTE without significant finding. -Continue IV heparin -Optimize electrolytes -Continue telemetry  Liver mass: Noted on CT angio chest/abdomen and pelvis.  Reports normal colonoscopy about 9 years ago.  No constitutional symptoms. -Follow MRCP.  -Check AFP, CEA and CA 19-9   Uncontrolled IDDM-2 with hyperglycemia: A1c 8.8%.  On Lantus, glimepiride and metformin at home.  Seems like she recently started Rybelsus which might be contributing to her GI symptoms. Recent Labs  Lab 10/23/22 0002 10/23/22 0738 10/23/22 1109  GLUCAP 220* 210* 188*  -Continue Semglee 20 units at bedtime -Continue SSI-moderate -Start NovoLog 4 units 3 times daily with meals -Hold home glimepiride/metformin. -Further adjustment as appropriate   Essential hypertension: Was hypertensive on

## 2022-10-23 NOTE — Progress Notes (Signed)
*  PRELIMINARY RESULTS* Echocardiogram 2D Echocardiogram has been performed.  Carolyne Fiscal 10/23/2022, 9:21 AM

## 2022-10-23 NOTE — Progress Notes (Signed)
Transition of Care Department [facility]) has reviewed patient and no TOC needs have been identified at this time. We will continue to monitor patient advancement through interdisciplinary progression rounds. If new patient transition needs arise, please place a TOC consult.   10/23/22 0911  TOC Brief Assessment  Insurance and Status Reviewed  Patient has primary care physician Yes  Home environment has been reviewed Lives alone.  Prior level of function: Independent.  Prior/Current Home Services No current home services  Social Determinants of Health Reivew SDOH reviewed no interventions necessary  Readmission risk has been reviewed Yes  Transition of care needs no transition of care needs at this time

## 2022-10-23 NOTE — Assessment & Plan Note (Signed)
Continue Crestor 

## 2022-10-23 NOTE — H&P (Signed)
History and Physical    Patient: Erin Hoffman:[id number] DOB: 1945/10/04 DOA: 10/22/2022 DOS: the patient was seen and examined on 10/23/2022 PCP: [name], MD  Patient coming from: Home  Chief Complaint:  Chief Complaint  Patient presents with   Back Pain   Abdominal Pain   HPI: Erin Hoffman is a 77 y.o. female with medical history significant of anxiety, diabetes mellitus type 2, hypertension, hypothyroidism, hyperlipidemia, GERD, presents to the ED with a chief complaint of epigastric and back pain.  Patient reports that she has been having a pain that has a burning to it in her back.  It radiates around to her right upper quadrant.  She reports that it is intermittent.  It seems to be worse with certain positional changes.  Eating has not seem to affect it.  She has been taking her Pepcid, which has not improved the pain.  She has taken some Tylenol and that does not affect the pain either.  Patient reports that this started Friday.  At first she thought she was just constipated.  She started a new med at home Rybelsus, and reports that she has had trouble with constipation since starting that.  As the pain continued, she noted it was either worse constipation than she normally has or different kind of pain.  She denies any chest pain, palpitations, dyspnea.  She denies peripheral edema at this time but reports that she does sometimes have asymmetrical peripheral edema after an injury to her leg in the past.  Patient has no other complaints at this time.  Patient does not smoke and does not drink.  Patient is full code.  She reports she would not want to remain on life support long-term. Review of Systems: As mentioned in the history of present illness. All other systems reviewed and are negative. Past Medical History:  Diagnosis Date   Anemia    Anxiety    Diabetes mellitus    H/O hiatal hernia    Hypertension    Hypothyroidism    Past Surgical History:   Procedure Laterality Date   ABDOMINAL HYSTERECTOMY     CATARACT EXTRACTION W/PHACO Left 06/16/2013   Procedure: CATARACT EXTRACTION PHACO AND INTRAOCULAR LENS PLACEMENT (IOC);  Surgeon: [name] T. Nile Riggs, MD;  Location: [facility];  Service: Ophthalmology;  Laterality: Left;  CDE:4.65   COLONOSCOPY N/A 10/01/2014   Procedure: COLONOSCOPY;  Surgeon: Malissa Hippo, MD;  Location: [facility];  Service: Endoscopy;  Laterality: N/A;  1125   TONSILLECTOMY     TUBAL LIGATION     Social History:  reports that she quit smoking about 34 years ago. Her smoking use included cigarettes. She started smoking about 64 years ago. She has a 30 pack-year smoking history. She has never used smokeless tobacco. She reports that she does not drink alcohol and does not use drugs.  No Known Allergies  Family History  Problem Relation Age of Onset   Brain cancer Father    Crohn's disease Sister    Coronary artery disease Sister    Multiple myeloma Brother    Diabetes Brother    Hypertension Brother    Rheum arthritis Mother     Prior to Admission medications   Medication Sig Start Date End Date Taking? Authorizing Provider  acetaminophen (TYLENOL) 500 MG tablet Take 500 mg by mouth every 6 (six) hours as needed for moderate pain (pain score 4-6).   Yes [provider]  ALPRAZolam Prudy Feeler) 0.5 MG tablet Take  0.5 mg by mouth at bedtime as needed for anxiety.   Yes [provider]  cyanocobalamin (,VITAMIN B-12,) 1000 MCG/ML injection Inject 1,000 mcg into the muscle every 30 (thirty) days.   Yes [provider]  enalapril (VASOTEC) 5 MG tablet Take 5 mg by mouth daily.   Yes [provider]  famotidine (PEPCID) 20 MG tablet Take 20 mg by mouth 2 (two) times daily as needed. 10/17/22  Yes [provider]  fenofibrate micronized (LOFIBRA) 134 MG capsule Take 134 mg by mouth daily before breakfast.   Yes [provider]  fexofenadine (ALLEGRA) 180 MG tablet Take 180  mg by mouth daily as needed for allergies or rhinitis.   Yes [provider]  glimepiride (AMARYL) 4 MG tablet Take 4 mg by mouth 2 (two) times daily.   Yes [provider]  insulin glargine (LANTUS) 100 UNIT/ML injection Inject 40 Units into the skin at bedtime as needed (high blood sugar).   Yes [provider]  levothyroxine (SYNTHROID, LEVOTHROID) 137 MCG tablet Take 137 mcg by mouth daily before breakfast.   Yes [provider]  metFORMIN (GLUCOPHAGE) 500 MG tablet Take 500 mg by mouth 2 (two) times daily with a meal.   Yes [provider]  Omega-3 Fatty Acids (FISH OIL) 1200 MG CAPS Take 1 capsule by mouth daily.    Yes [provider]  rosuvastatin (CRESTOR) 10 MG tablet Take 10 mg by mouth once a week. 09/13/22  Yes [provider]  traMADol (ULTRAM) 50 MG tablet Take by mouth every 6 (six) hours as needed for moderate pain (pain score 4-6).   Yes [provider]    Physical Exam: Vitals:   10/22/22 2145 10/23/22 0200 10/23/22 0245 10/23/22 0456  BP: (!) 162/84 (!) 164/59 (!) 154/84 (!) 195/93  Pulse: (!) 42 (!) 52 (!) 50 (!) 58  Resp: 19 15 15 18   Temp:    97.7 F (36.5 C)  TempSrc:    Oral  SpO2: 97% 96% 95% 99%  Weight:      Height:       1.  General: Patient lying supine in bed,  no acute distress   2. Psychiatric: Alert and oriented x 3, mood and behavior normal for situation, pleasant and cooperative with exam   3. Neurologic: Speech and language are normal, face is symmetric, moves all 4 extremities voluntarily, at baseline without acute deficits on limited exam   4. HEENMT:  Head is atraumatic, normocephalic, pupils reactive to light, neck is supple, trachea is midline, mucous membranes are moist   5. Respiratory : Lungs are clear to auscultation bilaterally without wheezing, rhonchi, rales, no cyanosis, no increase in work of breathing or accessory muscle use   6. Cardiovascular : Heart rate  bradycardic, rhythm is regular, no murmurs, rubs or gallops, no peripheral edema, peripheral pulses palpated   7. Gastrointestinal:  Abdomen is soft, nondistended, nontender to palpation bowel sounds active, no masses or organomegaly palpated   8. Skin:  Skin is warm, dry and intact without rashes, acute lesions, or ulcers on limited exam   9.Musculoskeletal:  No acute deformities or trauma, no asymmetry in tone, no peripheral edema, peripheral pulses palpated, no tenderness to palpation in the extremities  Data Reviewed: In the ED Patient is afebrile, heart rate is bradycardic, respiratory rate is normal, blood pressure is elevated, maintaining oxygen sats on room air No leukocytosis, hemoglobin 14.5 Chemistry is unremarkable aside from a glucose of 324  Trope 7, 8 UA is not indicative of UTI CTA is negative for aortic dissection or aneurysm but does show a heterogeneous mass of the left hepatic lobe of the liver and a mildly enlarged portal caval node.  This is suspicious for hepatocellular carcinoma but MRI will reveal more information. Cardiology consulted and recommended starting heparin and admitting for rate to monitoring  Assessment and Plan: * Atrial fibrillation (HCC) - Bradycardic atrial fibrillation with rate down to 42 in the ER - Cardiology consulted and recommended admission and heparin drip - Cardiology to see in the a.m. - Monitor on telemetry - TSH normal - Echo in the a.m. - Denies alcohol use - No recent infection - Denies respiratory symptoms/disease - Troponin 7, 8 - CTA shows no aortic dissection or aneurysm - Continue to monitor  Liver mass - As seen on CTA showing a heterogeneous mass of the left hepatic lobe - MRCP ordered - May need consult to oncology  HTN (hypertension) - Holding enalapril - Defer further hypertension treatment to cardiology, since I do not want to affect heart rate any further - Continue to monitor on telemetry  HLD  (hyperlipidemia) - Continue Crestor  GERD (gastroesophageal reflux disease) - Continue Pepcid  DMII (diabetes mellitus, type 2) (HCC) - Holding glimepiride - Sliding scale coverage  Hypothyroidism - Continue Synthroid - TSH 1.185 - Continue to monitor      Advance Care Planning:   Code Status: Full Code  Consults: Cardiology  Family Communication: No family at bedside  Severity of Illness: The appropriate patient status for this patient is OBSERVATION. Observation status is judged to be reasonable and necessary in order to provide the required intensity of service to ensure the patient's safety. The patient's presenting symptoms, physical exam findings, and initial radiographic and laboratory data in the context of their medical condition is felt to place them at decreased risk for further clinical deterioration. Furthermore, it is anticipated that the patient will be medically stable for discharge from the hospital within 2 midnights of admission.   Author: Lilyan Gilford, DO 10/23/2022 6:57 AM  For on call review [email or web address].

## 2022-10-23 NOTE — Assessment & Plan Note (Signed)
-   Continue Synthroid - TSH 1.185 - Continue to monitor

## 2022-10-23 NOTE — Progress Notes (Addendum)
PHARMACY - ANTICOAGULATION CONSULT NOTE  Pharmacy Consult for IV Heparin  Indication: afib  No Known Allergies  Patient Measurements: Height: 5\' 9"  (175.3 cm) Weight: 82.1 kg (181 lb) IBW/kg (Calculated) : 66.2 Heparin Dosing Weight: 81.6 kg  Vital Signs: Temp: 97.7 F (36.5 C) (10/15 0456) Temp Source: Oral (10/15 0456) BP: 195/93 (10/15 0456) Pulse Rate: 58 (10/15 0456)  Labs: Recent Labs    10/22/22 1221 10/22/22 1733 10/22/22 1855 10/23/22 0647  HGB 14.5  --   --  13.4  HCT 45.5  --   --  40.0  PLT 251  --   --  220  HEPARINUNFRC  --   --   --  0.31  CREATININE 1.01*  --   --  0.72  TROPONINIHS  --  7 8  --     Estimated Creatinine Clearance: 65.4 mL/min (by C-G formula based on SCr of 0.72 mg/dL).   Medical History: Past Medical History:  Diagnosis Date   Anemia    Anxiety    Diabetes mellitus    H/O hiatal hernia    Hypertension    Hypothyroidism    Assessment: Erin Hoffman is a 77 y.o. year old female admitted on 10/22/2022 with concern for new afib. No anticoagulation prior to admission. Pharmacy consulted to dose heparin.  Initial heparin level at goal (0.31) on 1000 units/hr. No bleeding or IV issues noted.   Follow up level dropped slightly to 0.24, will adjust rate.   Goal of Therapy:  Heparin level 0.3-0.7 units/ml Monitor platelets by anticoagulation protocol: Yes   Plan:  Increase heparin infusion to 1150 units/hr Daily heparin level, CBC, and monitoring for bleeding F/u plans for anticoagulation   Thank you for allowing pharmacy to participate in this patient's care.  Sheppard Coil PharmD., BCPS Clinical Pharmacist 10/23/2022 11:56 AM

## 2022-10-23 NOTE — Inpatient Diabetes Management (Signed)
Inpatient Diabetes Program Recommendations  AACE/ADA: New Consensus Statement on Inpatient Glycemic Control (2015)  Target Ranges:  Prepandial:   less than 140 mg/dL      Peak postprandial:   less than 180 mg/dL (1-2 hours)      Critically ill patients:  140 - 180 mg/dL   Lab Results  Component Value Date   GLUCAP 210 (H) 10/23/2022   HGBA1C 8.8 (H) 10/22/2022    Review of Glycemic Control  Latest Reference Range & Units 10/23/22 00:02 10/23/22 07:38  Glucose-Capillary 70 - 99 mg/dL 161 (H) 096 (H)   Diabetes history: DM  Outpatient Diabetes medications:  Lantus 40 units q HS Amaryl 4 mg bid Metformin 500 mg bid Current orders for Inpatient glycemic control:  Novolog 0-15 units tid with meals and HS Semglee 20 units q HS  Inpatient Diabetes Program Recommendations:    Consider increasing Semglee to 30 units q HS.    Thanks,  Beryl Meager, RN, BC-ADM Inpatient Diabetes Coordinator Pager [phone]  (8a-5p)

## 2022-10-23 NOTE — Assessment & Plan Note (Signed)
-   Holding enalapril - Defer further hypertension treatment to cardiology, since I do not want to affect heart rate any further - Continue to monitor on telemetry

## 2022-10-23 NOTE — Care Management Obs Status (Signed)
MEDICARE OBSERVATION STATUS NOTIFICATION   Patient Details  Name: Erin Hoffman MRN: [id number] Date of Birth: 10-22-1945   Medicare Observation Status Notification Given:  Yes    Karn Cassis, LCSW 10/23/2022, 9:13 AM

## 2022-10-23 NOTE — Assessment & Plan Note (Signed)
-   Holding glimepiride - Sliding scale coverage

## 2022-10-23 NOTE — Assessment & Plan Note (Signed)
Continue Pepcid  

## 2022-10-23 NOTE — Progress Notes (Signed)
PHARMACY - ANTICOAGULATION CONSULT NOTE  Pharmacy Consult for IV Heparin  Indication: afib  No Known Allergies  Patient Measurements: Height: 5\' 9"  (175.3 cm) Weight: 82.1 kg (181 lb) IBW/kg (Calculated) : 66.2 Heparin Dosing Weight: 81.6 kg  Vital Signs: Temp: 97.9 F (36.6 C) (10/15 2057) Temp Source: Oral (10/15 2057) BP: 160/72 (10/15 2200) Pulse Rate: 52 (10/15 1311)  Labs: Recent Labs    10/22/22 1221 10/22/22 1733 10/22/22 1855 10/23/22 0647 10/23/22 1213 10/23/22 2255  HGB 14.5  --   --  13.4  --   --   HCT 45.5  --   --  40.0  --   --   PLT 251  --   --  220  --   --   HEPARINUNFRC  --   --   --  0.31 0.24* 0.32  CREATININE 1.01*  --   --  0.72  --   --   TROPONINIHS  --  7 8  --   --   --     Estimated Creatinine Clearance: 65.4 mL/min (by C-G formula based on SCr of 0.72 mg/dL).   Medical History: Past Medical History:  Diagnosis Date   Anemia    Anxiety    Diabetes mellitus    H/O hiatal hernia    Hypertension    Hypothyroidism    Assessment: Erin Hoffman is a 77 y.o. year old female admitted on 10/22/2022 with concern for new afib. No anticoagulation prior to admission. Pharmacy consulted to dose heparin.  Heparin level at goal (0.32) on 1150 units/hr. No bleeding or IV issues per RN. CBC stable  Goal of Therapy:  Heparin level 0.3-0.7 units/ml Monitor platelets by anticoagulation protocol: Yes   Plan:  Continue heparin infusion at 1150 units/hr  8h confirmatory heparin level Daily heparin level, CBC, and monitoring for bleeding F/u plans for anticoagulation   Thank you for allowing pharmacy to participate in this patient's care.  Arabella Merles, PharmD. Clinical Pharmacist 10/23/2022 11:56 PM

## 2022-10-23 NOTE — Assessment & Plan Note (Signed)
-   Bradycardic atrial fibrillation with rate down to 42 in the ER - Cardiology consulted and recommended admission and heparin drip - Cardiology to see in the a.m. - Monitor on telemetry - TSH normal - Echo in the a.m. - Denies alcohol use - No recent infection - Denies respiratory symptoms/disease - Troponin 7, 8 - CTA shows no aortic dissection or aneurysm - Continue to monitor

## 2022-10-23 NOTE — Assessment & Plan Note (Signed)
-   As seen on CTA showing a heterogeneous mass of the left hepatic lobe - MRCP ordered - May need consult to oncology

## 2022-10-24 ENCOUNTER — Inpatient Hospital Stay (HOSPITAL_COMMUNITY): Payer: HMO

## 2022-10-24 ENCOUNTER — Other Ambulatory Visit (HOSPITAL_COMMUNITY): Payer: Self-pay

## 2022-10-24 DIAGNOSIS — I10 Essential (primary) hypertension: Secondary | ICD-10-CM | POA: Diagnosis not present

## 2022-10-24 DIAGNOSIS — E039 Hypothyroidism, unspecified: Secondary | ICD-10-CM | POA: Diagnosis not present

## 2022-10-24 DIAGNOSIS — K219 Gastro-esophageal reflux disease without esophagitis: Secondary | ICD-10-CM | POA: Diagnosis not present

## 2022-10-24 DIAGNOSIS — E785 Hyperlipidemia, unspecified: Secondary | ICD-10-CM

## 2022-10-24 DIAGNOSIS — I4891 Unspecified atrial fibrillation: Secondary | ICD-10-CM | POA: Diagnosis not present

## 2022-10-24 LAB — COMPREHENSIVE METABOLIC PANEL
ALT: 39 U/L (ref 0–44)
AST: 43 U/L — ABNORMAL HIGH (ref 15–41)
Albumin: 4 g/dL (ref 3.5–5.0)
Alkaline Phosphatase: 68 U/L (ref 38–126)
Anion gap: 11 (ref 5–15)
BUN: 13 mg/dL (ref 8–23)
CO2: 25 mmol/L (ref 22–32)
Calcium: 9.3 mg/dL (ref 8.9–10.3)
Chloride: 101 mmol/L (ref 98–111)
Creatinine, Ser: 0.98 mg/dL (ref 0.44–1.00)
GFR, Estimated: 59 mL/min — ABNORMAL LOW (ref >60)
Glucose, Bld: 152 mg/dL — ABNORMAL HIGH (ref 70–99)
Potassium: 3.5 mmol/L (ref 3.5–5.1)
Sodium: 137 mmol/L (ref 135–145)
Total Bilirubin: 1.2 mg/dL (ref 0.3–1.2)
Total Protein: 7.5 g/dL (ref 6.5–8.1)

## 2022-10-24 LAB — CBC
HCT: 43.6 % (ref 36.0–46.0)
Hemoglobin: 14 g/dL (ref 12.0–15.0)
MCH: 27.9 pg (ref 26.0–34.0)
MCHC: 32.1 g/dL (ref 30.0–36.0)
MCV: 86.9 fL (ref 80.0–100.0)
Platelets: 256 10*3/uL (ref 150–400)
RBC: 5.02 MIL/uL (ref 3.87–5.11)
RDW: 14.1 % (ref 11.5–15.5)
WBC: 10.2 10*3/uL (ref 4.0–10.5)
nRBC: 0 % (ref 0.0–0.2)

## 2022-10-24 LAB — MAGNESIUM: Magnesium: 1.7 mg/dL (ref 1.7–2.4)

## 2022-10-24 LAB — GLUCOSE, CAPILLARY
Glucose-Capillary: 184 mg/dL — ABNORMAL HIGH (ref 70–99)
Glucose-Capillary: 177 mg/dL — ABNORMAL HIGH (ref 70–99)
Glucose-Capillary: 275 mg/dL — ABNORMAL HIGH (ref 70–99)
Glucose-Capillary: 166 mg/dL — ABNORMAL HIGH (ref 70–99)

## 2022-10-24 LAB — HEPARIN LEVEL (UNFRACTIONATED): Heparin Unfractionated: 0.46 [IU]/mL (ref 0.30–0.70)

## 2022-10-24 MED ORDER — ALUM & MAG HYDROXIDE-SIMETH 200-200-20 MG/5ML PO SUSP
30.0000 mL | ORAL | Status: DC | PRN
  Administered 2022-10-24 – 2022-10-25 (×6): 30 mL via ORAL
  Filled 2022-10-24 (×6): qty 30

## 2022-10-24 MED ORDER — GADOBUTROL 1 MMOL/ML IV SOLN
7.0000 mL | Freq: Once | INTRAVENOUS | Status: AC | PRN
  Administered 2022-10-24: 7 mL via INTRAVENOUS

## 2022-10-24 MED ORDER — HYDROXYZINE HCL 25 MG PO TABS
25.0000 mg | ORAL_TABLET | Freq: Once | ORAL | Status: AC
  Administered 2022-10-24: 25 mg via ORAL
  Filled 2022-10-24: qty 1

## 2022-10-24 NOTE — Progress Notes (Signed)
PHARMACY - ANTICOAGULATION CONSULT NOTE  Pharmacy Consult for IV Heparin  Indication: afib  Allergies  Allergen Reactions   Tape Itching and Rash    Patient Measurements: Height: 5\' 9"  (175.3 cm) Weight: 82.1 kg (181 lb) IBW/kg (Calculated) : 66.2 Heparin Dosing Weight: 81.6 kg  Vital Signs: Temp: 97.9 F (36.6 C) (10/16 0327) Temp Source: Oral (10/15 2057) BP: 163/84 (10/16 0327) Pulse Rate: 64 (10/16 0327)  Labs: Recent Labs    10/22/22 1221 10/22/22 1221 10/22/22 1733 10/22/22 1855 10/23/22 0647 10/23/22 1213 10/23/22 2255 10/24/22 0425 10/24/22 0738  HGB 14.5  --   --   --  13.4  --   --  14.0  --   HCT 45.5  --   --   --  40.0  --   --  43.6  --   PLT 251  --   --   --  220  --   --  256  --   HEPARINUNFRC  --    < >  --   --  0.31 0.24* 0.32  --  0.46  CREATININE 1.01*  --   --   --  0.72  --   --  0.98  --   TROPONINIHS  --   --  7 8  --   --   --   --   --    < > = values in this interval not displayed.    Estimated Creatinine Clearance: 53.3 mL/min (by C-G formula based on SCr of 0.98 mg/dL).   Medical History: Past Medical History:  Diagnosis Date   Anemia    Anxiety    Diabetes mellitus    H/O hiatal hernia    Hypertension    Hypothyroidism    Assessment: Erin Hoffman is a 77 y.o. year old female admitted on 10/22/2022 with concern for new afib. No anticoagulation prior to admission. Pharmacy consulted to dose heparin.  Heparin levels now at goal on 1150 units/hr. No bleeding or IV issues noted. CBC wnl  Goal of Therapy:  Heparin level 0.3-0.7 units/ml Monitor platelets by anticoagulation protocol: Yes   Plan:  Continue heparin infusion at 1150 units/hr Daily heparin level, CBC, and monitoring for bleeding F/u plans for anticoagulation   Thank you for allowing pharmacy to participate in this patient's care.  Sheppard Coil PharmD., BCPS Clinical Pharmacist 10/24/2022 8:21 AM

## 2022-10-24 NOTE — Plan of Care (Signed)
  Problem: Education: Goal: Knowledge of disease or condition will improve Outcome: Progressing Goal: Understanding of medication regimen will improve Outcome: Not Met (add Reason)   Problem: Activity: Goal: Ability to tolerate increased activity will improve Outcome: Progressing

## 2022-10-24 NOTE — Progress Notes (Signed)
Mobility Specialist Progress Note:    10/24/22 1315  Mobility  Activity Ambulated with assistance in hallway  Level of Assistance Contact guard assist, steadying assist  Assistive Device None  Distance Ambulated (ft) 160 ft  Range of Motion/Exercises Active;All extremities  Activity Response Tolerated well  Mobility Referral Yes  $Mobility charge 1 Mobility  Mobility Specialist Start Time (ACUTE ONLY) 1315  Mobility Specialist Stop Time (ACUTE ONLY) 1325  Mobility Specialist Time Calculation (min) (ACUTE ONLY) 10 min   Pt received in chair, agreeable to mobility. Required CGA to stand and ambulate with RW. Tolerated well, asx throughout. Returned pt to room, left in chair. All needs met.   Lawerance Bach Mobility Specialist Please contact via Special educational needs teacher or  Rehab office at [phone]

## 2022-10-24 NOTE — Progress Notes (Signed)
F (36.6 C)  97.9 F (36.6 C)   TempSrc: Oral     SpO2: 100%  94% 98%  Weight:      Height:        Intake/Output Summary (Last 24 hours) at 10/24/2022 1503 Last data filed at 10/24/2022 0300 Gross per 24 hour  Intake 240 ml  Output --  Net 240 ml   Filed Weights   10/22/22 1143 10/23/22 0700  Weight: 81.6 kg 82.1 kg     Exam General: Alert and oriented x 3, NAD Cardiovascular: S1 S2 auscultated, no murmurs, RRR Respiratory: Clear to auscultation bilaterally, no wheezing, rales or rhonchi Gastrointestinal: Soft, nontender, nondistended, + bowel sounds Ext: no pedal edema bilaterally Neuro: AAOx3, Cr N's II- XII. Strength 5/5 upper and lower extremities bilaterally Skin: No rashes Psych: Normal affect and demeanor, alert and oriented x3    Data Reviewed:  I have personally reviewed  following labs and imaging studies   CBC Lab Results  Component Value Date   WBC 10.2 10/24/2022   RBC 5.02 10/24/2022   HGB 14.0 10/24/2022   HCT 43.6 10/24/2022   MCV 86.9 10/24/2022   MCH 27.9 10/24/2022   PLT 256 10/24/2022   MCHC 32.1 10/24/2022   RDW 14.1 10/24/2022   LYMPHSABS 1.5 10/23/2022   MONOABS 0.5 10/23/2022   EOSABS 0.2 10/23/2022   BASOSABS 0.0 10/23/2022     Last metabolic panel Lab Results  Component Value Date   NA 137 10/24/2022   K 3.5 10/24/2022   CL 101 10/24/2022   CO2 25 10/24/2022   BUN 13 10/24/2022   CREATININE 0.98 10/24/2022   GLUCOSE 152 (H) 10/24/2022   GFRNONAA 59 (L) 10/24/2022   CALCIUM 9.3 10/24/2022   PROT 7.5 10/24/2022   ALBUMIN 4.0 10/24/2022   BILITOT 1.2 10/24/2022   ALKPHOS 68 10/24/2022   AST 43 (H) 10/24/2022   ALT 39 10/24/2022   ANIONGAP 11 10/24/2022    CBG (last 3)  Recent Labs    10/23/22 2100 10/24/22 0755 10/24/22 1117  GLUCAP 114* 184* 177*      Coagulation Profile: No results for input(s): "INR", "PROTIME" in the last 168 hours.   Radiology Studies: ECHOCARDIOGRAM COMPLETE  Result Date: 10/23/2022    ECHOCARDIOGRAM REPORT   Patient Name:   Erin Hoffman Date of Exam: 10/23/2022 Medical Rec #:  [id number]          Height:       69.0 in Accession #:    [id number]         Weight:       181.0 lb Date of Birth:  [birth year]          BSA:          1.980 m Patient Age:    77 years           BP:           195/93 mmHg Patient Gender: F                  HR:           45 bpm. Exam Location:  [facility] Procedure: 2D Echo, Cardiac Doppler and Color Doppler Indications:    Atrial Fibrillation  History:        Patient has no prior history of Echocardiogram examinations.                 Arrythmias:Atrial Fibrillation; Risk Factors:Hypertension,  [facility]                                                                               Erin Hoffman, is a 77 y.o. female, DOB - [birth year], [id number] Admit date - 10/22/2022    Outpatient Primary MD for the patient is [name], MD  LOS - 1  days    Brief summary   77 year old F with PMH of DM-2, HTN, HLD, hypothyroidism, GERD and former tobacco smoke presenting with epigastric abdominal pain that she describes as indigestion and burping and right-sided back pain for about 3 days, and admitted with new onset atrial fibrillation with bradycardia and liver lesion.  UA negative for UTI CT angio chest/abdomen/pelvis negative for aortic dissection or aneurysm but heterogeneous mass of the left hepatic lobe and mildly enlarged portal caval node with concern for HCC, and MRI is recommended.  Cardiology consulted for A-fib and recommended starting heparin.  Echocardiogram and MRI ordered.    Assessment & Plan    Assessment and Plan: * Atrial fibrillation (HCC)  Rate better controlled TTE without significant finding. Patient on IV heparin continue to monitor  Liver mass MRCP ordered Continue follow-up with AFP CEA and CA 19-9  HTN (hypertension) Well-controlled  HLD (hyperlipidemia) Continue with Crestor  GERD (gastroesophageal reflux disease) Continue with Protonix 40 mg daily  DMII (diabetes mellitus, type 2) (HCC) with hyperglycemia Last A1c is 8.8 Continue with sliding scale insulin and Semglee 20 units at bedtime hold oral medications at this time  Hypothyroidism TSH within normal limits and continue with Synthroid    Estimated body mass index is 26.73 kg/m as calculated from the following:   Height as of this encounter: 5\' 9"  (1.753 m).   Weight as of this encounter: 82.1 kg.  Code Status: Full code DVT Prophylaxis:  SCDs Start: 10/22/22 2348   Level of Care: Level of care: Telemetry Family Communication: None at  bedside  Disposition Plan:     Remains inpatient appropriate: Pending MRCP  Procedures:  None.  Consultants:   None.   Antimicrobials:   Anti-infectives (From admission, onward)    None        Medications  Scheduled Meds:  enalapril  5 mg Oral Daily   famotidine  20 mg Oral Daily   insulin aspart  0-15 Units Subcutaneous TID WC   insulin aspart  0-5 Units Subcutaneous QHS   insulin aspart  4 Units Subcutaneous TID WC   insulin glargine-yfgn  20 Units Subcutaneous QHS   levothyroxine  137 mcg Oral Q0600   pantoprazole  40 mg Oral Daily   rosuvastatin  10 mg Oral Weekly   Continuous Infusions:  heparin 1,150 Units/hr (10/23/22 2054)   PRN Meds:.acetaminophen **OR** acetaminophen, ALPRAZolam, alum & mag hydroxide-simeth, hydrALAZINE, ondansetron **OR** ondansetron (ZOFRAN) IV, oxyCODONE    Subjective:   Erin Hoffman was seen and examined today.  Pt waiting for the MRI  No new complaints.   Objective:   Vitals:   10/23/22 2057 10/23/22 2200 10/24/22 0327 10/24/22 1342  BP:  (!) 160/72 (!) 163/84 (!) 147/64  Pulse:   64 60  Resp: 14  18 14   Temp: 97.9  [facility]                                                                               Erin Hoffman, is a 77 y.o. female, DOB - [birth year], [id number] Admit date - 10/22/2022    Outpatient Primary MD for the patient is [name], MD  LOS - 1  days    Brief summary   77 year old F with PMH of DM-2, HTN, HLD, hypothyroidism, GERD and former tobacco smoke presenting with epigastric abdominal pain that she describes as indigestion and burping and right-sided back pain for about 3 days, and admitted with new onset atrial fibrillation with bradycardia and liver lesion.  UA negative for UTI CT angio chest/abdomen/pelvis negative for aortic dissection or aneurysm but heterogeneous mass of the left hepatic lobe and mildly enlarged portal caval node with concern for HCC, and MRI is recommended.  Cardiology consulted for A-fib and recommended starting heparin.  Echocardiogram and MRI ordered.    Assessment & Plan    Assessment and Plan: * Atrial fibrillation (HCC)  Rate better controlled TTE without significant finding. Patient on IV heparin continue to monitor  Liver mass MRCP ordered Continue follow-up with AFP CEA and CA 19-9  HTN (hypertension) Well-controlled  HLD (hyperlipidemia) Continue with Crestor  GERD (gastroesophageal reflux disease) Continue with Protonix 40 mg daily  DMII (diabetes mellitus, type 2) (HCC) with hyperglycemia Last A1c is 8.8 Continue with sliding scale insulin and Semglee 20 units at bedtime hold oral medications at this time  Hypothyroidism TSH within normal limits and continue with Synthroid    Estimated body mass index is 26.73 kg/m as calculated from the following:   Height as of this encounter: 5\' 9"  (1.753 m).   Weight as of this encounter: 82.1 kg.  Code Status: Full code DVT Prophylaxis:  SCDs Start: 10/22/22 2348   Level of Care: Level of care: Telemetry Family Communication: None at  bedside  Disposition Plan:     Remains inpatient appropriate: Pending MRCP  Procedures:  None.  Consultants:   None.   Antimicrobials:   Anti-infectives (From admission, onward)    None        Medications  Scheduled Meds:  enalapril  5 mg Oral Daily   famotidine  20 mg Oral Daily   insulin aspart  0-15 Units Subcutaneous TID WC   insulin aspart  0-5 Units Subcutaneous QHS   insulin aspart  4 Units Subcutaneous TID WC   insulin glargine-yfgn  20 Units Subcutaneous QHS   levothyroxine  137 mcg Oral Q0600   pantoprazole  40 mg Oral Daily   rosuvastatin  10 mg Oral Weekly   Continuous Infusions:  heparin 1,150 Units/hr (10/23/22 2054)   PRN Meds:.acetaminophen **OR** acetaminophen, ALPRAZolam, alum & mag hydroxide-simeth, hydrALAZINE, ondansetron **OR** ondansetron (ZOFRAN) IV, oxyCODONE    Subjective:   Erin Hoffman was seen and examined today.  Pt waiting for the MRI  No new complaints.   Objective:   Vitals:   10/23/22 2057 10/23/22 2200 10/24/22 0327 10/24/22 1342  BP:  (!) 160/72 (!) 163/84 (!) 147/64  Pulse:   64 60  Resp: 14  18 14   Temp: 97.9  [facility]                                                                               Erin Hoffman, is a 77 y.o. female, DOB - [birth year], [id number] Admit date - 10/22/2022    Outpatient Primary MD for the patient is [name], MD  LOS - 1  days    Brief summary   77 year old F with PMH of DM-2, HTN, HLD, hypothyroidism, GERD and former tobacco smoke presenting with epigastric abdominal pain that she describes as indigestion and burping and right-sided back pain for about 3 days, and admitted with new onset atrial fibrillation with bradycardia and liver lesion.  UA negative for UTI CT angio chest/abdomen/pelvis negative for aortic dissection or aneurysm but heterogeneous mass of the left hepatic lobe and mildly enlarged portal caval node with concern for HCC, and MRI is recommended.  Cardiology consulted for A-fib and recommended starting heparin.  Echocardiogram and MRI ordered.    Assessment & Plan    Assessment and Plan: * Atrial fibrillation (HCC)  Rate better controlled TTE without significant finding. Patient on IV heparin continue to monitor  Liver mass MRCP ordered Continue follow-up with AFP CEA and CA 19-9  HTN (hypertension) Well-controlled  HLD (hyperlipidemia) Continue with Crestor  GERD (gastroesophageal reflux disease) Continue with Protonix 40 mg daily  DMII (diabetes mellitus, type 2) (HCC) with hyperglycemia Last A1c is 8.8 Continue with sliding scale insulin and Semglee 20 units at bedtime hold oral medications at this time  Hypothyroidism TSH within normal limits and continue with Synthroid    Estimated body mass index is 26.73 kg/m as calculated from the following:   Height as of this encounter: 5\' 9"  (1.753 m).   Weight as of this encounter: 82.1 kg.  Code Status: Full code DVT Prophylaxis:  SCDs Start: 10/22/22 2348   Level of Care: Level of care: Telemetry Family Communication: None at  bedside  Disposition Plan:     Remains inpatient appropriate: Pending MRCP  Procedures:  None.  Consultants:   None.   Antimicrobials:   Anti-infectives (From admission, onward)    None        Medications  Scheduled Meds:  enalapril  5 mg Oral Daily   famotidine  20 mg Oral Daily   insulin aspart  0-15 Units Subcutaneous TID WC   insulin aspart  0-5 Units Subcutaneous QHS   insulin aspart  4 Units Subcutaneous TID WC   insulin glargine-yfgn  20 Units Subcutaneous QHS   levothyroxine  137 mcg Oral Q0600   pantoprazole  40 mg Oral Daily   rosuvastatin  10 mg Oral Weekly   Continuous Infusions:  heparin 1,150 Units/hr (10/23/22 2054)   PRN Meds:.acetaminophen **OR** acetaminophen, ALPRAZolam, alum & mag hydroxide-simeth, hydrALAZINE, ondansetron **OR** ondansetron (ZOFRAN) IV, oxyCODONE    Subjective:   Erin Hoffman was seen and examined today.  Pt waiting for the MRI  No new complaints.   Objective:   Vitals:   10/23/22 2057 10/23/22 2200 10/24/22 0327 10/24/22 1342  BP:  (!) 160/72 (!) 163/84 (!) 147/64  Pulse:   64 60  Resp: 14  18 14   Temp: 97.9  [facility]                                                                               Erin Hoffman, is a 77 y.o. female, DOB - [birth year], [id number] Admit date - 10/22/2022    Outpatient Primary MD for the patient is [name], MD  LOS - 1  days    Brief summary   77 year old F with PMH of DM-2, HTN, HLD, hypothyroidism, GERD and former tobacco smoke presenting with epigastric abdominal pain that she describes as indigestion and burping and right-sided back pain for about 3 days, and admitted with new onset atrial fibrillation with bradycardia and liver lesion.  UA negative for UTI CT angio chest/abdomen/pelvis negative for aortic dissection or aneurysm but heterogeneous mass of the left hepatic lobe and mildly enlarged portal caval node with concern for HCC, and MRI is recommended.  Cardiology consulted for A-fib and recommended starting heparin.  Echocardiogram and MRI ordered.    Assessment & Plan    Assessment and Plan: * Atrial fibrillation (HCC)  Rate better controlled TTE without significant finding. Patient on IV heparin continue to monitor  Liver mass MRCP ordered Continue follow-up with AFP CEA and CA 19-9  HTN (hypertension) Well-controlled  HLD (hyperlipidemia) Continue with Crestor  GERD (gastroesophageal reflux disease) Continue with Protonix 40 mg daily  DMII (diabetes mellitus, type 2) (HCC) with hyperglycemia Last A1c is 8.8 Continue with sliding scale insulin and Semglee 20 units at bedtime hold oral medications at this time  Hypothyroidism TSH within normal limits and continue with Synthroid    Estimated body mass index is 26.73 kg/m as calculated from the following:   Height as of this encounter: 5\' 9"  (1.753 m).   Weight as of this encounter: 82.1 kg.  Code Status: Full code DVT Prophylaxis:  SCDs Start: 10/22/22 2348   Level of Care: Level of care: Telemetry Family Communication: None at  bedside  Disposition Plan:     Remains inpatient appropriate: Pending MRCP  Procedures:  None.  Consultants:   None.   Antimicrobials:   Anti-infectives (From admission, onward)    None        Medications  Scheduled Meds:  enalapril  5 mg Oral Daily   famotidine  20 mg Oral Daily   insulin aspart  0-15 Units Subcutaneous TID WC   insulin aspart  0-5 Units Subcutaneous QHS   insulin aspart  4 Units Subcutaneous TID WC   insulin glargine-yfgn  20 Units Subcutaneous QHS   levothyroxine  137 mcg Oral Q0600   pantoprazole  40 mg Oral Daily   rosuvastatin  10 mg Oral Weekly   Continuous Infusions:  heparin 1,150 Units/hr (10/23/22 2054)   PRN Meds:.acetaminophen **OR** acetaminophen, ALPRAZolam, alum & mag hydroxide-simeth, hydrALAZINE, ondansetron **OR** ondansetron (ZOFRAN) IV, oxyCODONE    Subjective:   Erin Hoffman was seen and examined today.  Pt waiting for the MRI  No new complaints.   Objective:   Vitals:   10/23/22 2057 10/23/22 2200 10/24/22 0327 10/24/22 1342  BP:  (!) 160/72 (!) 163/84 (!) 147/64  Pulse:   64 60  Resp: 14  18 14   Temp: 97.9

## 2022-10-24 NOTE — TOC Benefit Eligibility Note (Signed)
Patient Product/process development scientist completed.    The patient is insured through [employer]/ [employer]. Patient has Medicare and is not eligible for a copay card, but may be able to apply for patient assistance, if available.    Ran test claim for Eliquis 5 mg and the current 30 day co-pay is $47.00 .   This test claim was processed through [facility]- copay amounts may vary at other pharmacies due to pharmacy/plan contracts, or as the patient moves through the different stages of their insurance plan.     Roland Earl, CPHT Pharmacy Technician III Certified Patient Advocate [facility] Patient Advocate Team Direct Number: [phone]  Fax: [phone]

## 2022-10-25 ENCOUNTER — Inpatient Hospital Stay (HOSPITAL_COMMUNITY): Payer: HMO

## 2022-10-25 DIAGNOSIS — C221 Intrahepatic bile duct carcinoma: Secondary | ICD-10-CM

## 2022-10-25 DIAGNOSIS — E039 Hypothyroidism, unspecified: Secondary | ICD-10-CM | POA: Diagnosis not present

## 2022-10-25 DIAGNOSIS — K219 Gastro-esophageal reflux disease without esophagitis: Secondary | ICD-10-CM | POA: Diagnosis not present

## 2022-10-25 DIAGNOSIS — I4891 Unspecified atrial fibrillation: Secondary | ICD-10-CM | POA: Diagnosis not present

## 2022-10-25 DIAGNOSIS — R160 Hepatomegaly, not elsewhere classified: Secondary | ICD-10-CM | POA: Diagnosis not present

## 2022-10-25 DIAGNOSIS — I10 Essential (primary) hypertension: Secondary | ICD-10-CM | POA: Diagnosis not present

## 2022-10-25 LAB — CBC
HCT: 40.2 % (ref 36.0–46.0)
Hemoglobin: 12.8 g/dL (ref 12.0–15.0)
MCH: 27.8 pg (ref 26.0–34.0)
MCHC: 31.8 g/dL (ref 30.0–36.0)
MCV: 87.4 fL (ref 80.0–100.0)
Platelets: 203 10*3/uL (ref 150–400)
RBC: 4.6 MIL/uL (ref 3.87–5.11)
RDW: 14.2 % (ref 11.5–15.5)
WBC: 7.6 10*3/uL (ref 4.0–10.5)
nRBC: 0 % (ref 0.0–0.2)

## 2022-10-25 LAB — GLUCOSE, CAPILLARY
Glucose-Capillary: 155 mg/dL — ABNORMAL HIGH (ref 70–99)
Glucose-Capillary: 176 mg/dL — ABNORMAL HIGH (ref 70–99)
Glucose-Capillary: 128 mg/dL — ABNORMAL HIGH (ref 70–99)
Glucose-Capillary: 152 mg/dL — ABNORMAL HIGH (ref 70–99)

## 2022-10-25 LAB — CANCER ANTIGEN 19-9: CA 19-9: 381 U/mL — ABNORMAL HIGH (ref 0–35)

## 2022-10-25 LAB — CEA: CEA: 39 ng/mL — ABNORMAL HIGH (ref 0.0–4.7)

## 2022-10-25 LAB — HEPARIN LEVEL (UNFRACTIONATED): Heparin Unfractionated: 0.45 [IU]/mL (ref 0.30–0.70)

## 2022-10-25 LAB — AFP TUMOR MARKER: AFP, Serum, Tumor Marker: 3.1 ng/mL (ref 0.0–9.2)

## 2022-10-25 MED ORDER — ALPRAZOLAM 0.5 MG PO TABS
0.5000 mg | ORAL_TABLET | Freq: Two times a day (BID) | ORAL | Status: DC | PRN
  Administered 2022-10-25 – 2022-10-26 (×2): 0.5 mg via ORAL
  Filled 2022-10-25 (×2): qty 1

## 2022-10-25 MED ORDER — APIXABAN 5 MG PO TABS
5.0000 mg | ORAL_TABLET | Freq: Two times a day (BID) | ORAL | Status: DC
  Filled 2022-10-25: qty 1

## 2022-10-25 MED ORDER — POLYETHYLENE GLYCOL 3350 17 G PO PACK
17.0000 g | PACK | Freq: Every day | ORAL | Status: DC
  Administered 2022-10-26 – 2022-10-27 (×2): 17 g via ORAL
  Filled 2022-10-25 (×2): qty 1

## 2022-10-25 NOTE — Discharge Instructions (Signed)

## 2022-10-25 NOTE — Progress Notes (Signed)
PHARMACY - ANTICOAGULATION CONSULT NOTE  Pharmacy Consult for IV Heparin  >> apixaban Indication: afib  Allergies  Allergen Reactions   Tape Itching and Rash    Patient Measurements: Height: 5\' 9"  (175.3 cm) Weight: 82.1 kg (181 lb) IBW/kg (Calculated) : 66.2 Heparin Dosing Weight: 81.6 kg  Vital Signs: Temp: 98.7 F (37.1 C) (10/17 1304) BP: 125/62 (10/17 1304) Pulse Rate: 57 (10/17 0555)  Labs: Recent Labs     0000 10/22/22 1733 10/22/22 1855 10/23/22 0647 10/23/22 1213 10/23/22 2255 10/24/22 0425 10/24/22 0738 10/25/22 0512  HGB   < >  --   --  13.4  --   --  14.0  --  12.8  HCT  --   --   --  40.0  --   --  43.6  --  40.2  PLT  --   --   --  220  --   --  256  --  203  HEPARINUNFRC  --   --   --  0.31   < > 0.32  --  0.46 0.45  CREATININE  --   --   --  0.72  --   --  0.98  --   --   TROPONINIHS  --  7 8  --   --   --   --   --   --    < > = values in this interval not displayed.    Estimated Creatinine Clearance: 53.3 mL/min (by C-G formula based on SCr of 0.98 mg/dL).   Medical History: Past Medical History:  Diagnosis Date   Anemia    Anxiety    Diabetes mellitus    H/O hiatal hernia    Hypertension    Hypothyroidism    Assessment: Erin Hoffman is a 77 y.o. year old female admitted on 10/22/2022 with concern for new afib. No anticoagulation prior to admission. Pharmacy consulted to transition from heparin to apixaban.    Goal of Therapy:  Heparin level 0.3-0.7 units/ml Monitor platelets by anticoagulation protocol: Yes   Plan:  Stop heparin infusion Start apixaban 5 mg twice daily  Monitor H&H and s/s of bleeding  Thank you for allowing pharmacy to participate in this patient's care.  Judeth Cornfield, PharmD Clinical Pharmacist 10/25/2022 2:44 PM

## 2022-10-25 NOTE — Progress Notes (Signed)
Pt transported to ultrasound to check for DVT.

## 2022-10-25 NOTE — Progress Notes (Signed)
PHARMACY - ANTICOAGULATION CONSULT NOTE  Pharmacy Consult for IV Heparin  Indication: afib  Allergies  Allergen Reactions   Tape Itching and Rash    Patient Measurements: Height: 5\' 9"  (175.3 cm) Weight: 82.1 kg (181 lb) IBW/kg (Calculated) : 66.2 Heparin Dosing Weight: 81.6 kg  Vital Signs: Temp: 98.9 F (37.2 C) (10/17 0555) BP: 148/65 (10/17 0555) Pulse Rate: 57 (10/17 0555)  Labs: Recent Labs    10/22/22 1221 10/22/22 1733 10/22/22 1855 10/23/22 0647 10/23/22 1213 10/23/22 2255 10/24/22 0425 10/24/22 0738 10/25/22 0512  HGB 14.5  --   --  13.4  --   --  14.0  --  12.8  HCT 45.5  --   --  40.0  --   --  43.6  --  40.2  PLT 251  --   --  220  --   --  256  --  203  HEPARINUNFRC  --   --   --  0.31   < > 0.32  --  0.46 0.45  CREATININE 1.01*  --   --  0.72  --   --  0.98  --   --   TROPONINIHS  --  7 8  --   --   --   --   --   --    < > = values in this interval not displayed.    Estimated Creatinine Clearance: 53.3 mL/min (by C-G formula based on SCr of 0.98 mg/dL).   Medical History: Past Medical History:  Diagnosis Date   Anemia    Anxiety    Diabetes mellitus    H/O hiatal hernia    Hypertension    Hypothyroidism    Assessment: Erin Hoffman is a 76 y.o. year old female admitted on 10/22/2022 with concern for new afib. No anticoagulation prior to admission. Pharmacy consulted to dose heparin.  Heparin levels now at goal on 1150 units/hr. No bleeding or IV issues noted. CBC wnl  Goal of Therapy:  Heparin level 0.3-0.7 units/ml Monitor platelets by anticoagulation protocol: Yes   Plan:  Continue heparin infusion at 1150 units/hr Daily heparin level, CBC, and monitoring for bleeding F/u plans for anticoagulation   Thank you for allowing pharmacy to participate in this patient's care.  Judeth Cornfield, PharmD Clinical Pharmacist 10/25/2022 8:35 AM

## 2022-10-25 NOTE — Progress Notes (Signed)
[facility]                                                                               Erin Hoffman, is a 77 y.o. female, DOB - 1945/03/16, ZOX:[id number] Admit date - 10/22/2022    Outpatient Primary MD for the patient is Erin Nevins, MD  LOS - 2  days    Brief summary   77 year old F with PMH of DM-2, HTN, HLD, hypothyroidism, GERD and former tobacco smoke presenting with epigastric abdominal pain that she describes as indigestion and burping and right-sided back pain for about 3 days, and admitted with new onset atrial fibrillation with bradycardia and liver lesion.  UA negative for UTI CT angio chest/abdomen/pelvis negative for aortic dissection or aneurysm but heterogeneous mass of the left hepatic lobe and mildly enlarged portal caval node with concern for HCC, and MRI is recommended.  Cardiology consulted for A-fib and recommended starting heparin.  Echocardiogram and MRI ordered.    Assessment & Plan    Assessment and Plan: * Atrial fibrillation (HCC)  Rate better controlled. Rate around 50's/min.  TTE without significant finding. Transition to oral eliquis today.    Back Pain:  Indeterminate focus of enhancement in the right T9 posterior elements on the MRI.  Will need MRI of the thoracic , and lumbar spine, but it will be better if she get the PET scan instead.  She agrees.  Pain control.    Poorly marginated Liver mass MRCP ordered, showed  Dominant poorly marginated heterogeneously enhancing 4.4 x 3.4 cm liver mass centered in the central segment 4 left liver with marked intrahepatic biliary ductal dilatation in the left liver lobe. 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 There are other satellite lesions in the left lobe of the liver.  Multiple pulmonary nodules on the CT of the chest, max size of 7 mm. Will probably need a PET scan for further evaluation.  Oncology consulted for a consult and recommendations.    AFP is negative. CEA and CA 19-9 are elevated.  Has a h/o hysterectomy and cholecystectomy.  HTN (hypertension) Well controlled BP parameters.   HLD (hyperlipidemia) Continue with Crestor  GERD (gastroesophageal reflux disease) Continue with Protonix 40 mg daily  DMII (diabetes mellitus, type 2) (HCC) with hyperglycemia, well controlled CBG'S Last A1c is 8.8 Continue with sliding scale insulin and Semglee 20 units at bedtime hold oral medications at this time CBG (last 3)  Recent Labs    10/24/22 2109 10/25/22 0726 10/25/22 1131  GLUCAP 166* 155* 176*     Hypothyroidism TSH within normal limits and continue with Synthroid    Estimated body mass index is 26.73 kg/m as calculated from the following:   Height as of this encounter: 5\' 9"  (1.753 m).   Weight as of this encounter: 82.1 kg.  Code Status: Full code DVT Prophylaxis:  SCDs Start: 10/22/22 2348   Level of Care: Level of care: Telemetry Family Communication: None at bedside, discussed the results with the patient.   Disposition Plan:     Remains inpatient appropriate: awaiting oncology consult .   Procedures:  MRCP Echocardiogram  Consultants:   Oncology.   Antimicrobials:  Anti-infectives (From admission, onward)    None        Medications  Scheduled Meds:  enalapril  5 mg Oral Daily   famotidine  20 mg Oral Daily   insulin aspart  0-15 Units Subcutaneous TID WC   insulin aspart  0-5 Units Subcutaneous QHS   insulin aspart  4 Units Subcutaneous TID WC   insulin glargine-yfgn  20 Units Subcutaneous QHS   levothyroxine  137 mcg Oral Q0600   pantoprazole  40 mg Oral Daily   rosuvastatin  10 mg Oral Weekly   Continuous Infusions:  heparin 1,150 Units/hr (10/24/22 2141)   PRN Meds:.acetaminophen **OR** acetaminophen, ALPRAZolam, alum & mag hydroxide-simeth, hydrALAZINE, ondansetron **OR** ondansetron (ZOFRAN) IV, oxyCODONE    Subjective:   Erin Hoffman was seen and examined  today.  Continues to have mild right upper quadrant pain and back pain.   Objective:   Vitals:   10/24/22 2112 10/25/22 0555 10/25/22 1000 10/25/22 1304  BP: 131/75 (!) 148/65  125/62  Pulse: (!) 44 (!) 57    Resp: 20 18 15 17   Temp: 98.1 F (36.7 C) 98.9 F (37.2 C)  98.7 F (37.1 C)  TempSrc:      SpO2: 97% 97%  100%  Weight:      Height:        Intake/Output Summary (Last 24 hours) at 10/25/2022 1436 Last data filed at 10/25/2022 0500 Gross per 24 hour  Intake 240 ml  Output --  Net 240 ml   Filed Weights   10/22/22 1143 10/23/22 0700  Weight: 81.6 kg 82.1 kg     Exam General exam: Appears calm and comfortable  Respiratory system: Clear to auscultation. Respiratory effort normal. Cardiovascular system: S1 & S2 heard, irregularly irregular.  Gastrointestinal system: Abdomen is soft , mild tenderness in the Ruq.  Central nervous system: Alert and oriented. No focal neurological deficits. Extremities: Symmetric 5 x 5 power. Skin: No rashes, lesions or ulcers Psychiatry: Mood & affect appropriate.     Data Reviewed:  I have personally reviewed following labs and imaging studies   CBC Lab Results  Component Value Date   WBC 7.6 10/25/2022   RBC 4.60 10/25/2022   HGB 12.8 10/25/2022   HCT 40.2 10/25/2022   MCV 87.4 10/25/2022   MCH 27.8 10/25/2022   PLT 203 10/25/2022   MCHC 31.8 10/25/2022   RDW 14.2 10/25/2022   LYMPHSABS 1.5 10/23/2022   MONOABS 0.5 10/23/2022   EOSABS 0.2 10/23/2022   BASOSABS 0.0 10/23/2022     Last metabolic panel Lab Results  Component Value Date   NA 137 10/24/2022   K 3.5 10/24/2022   CL 101 10/24/2022   CO2 25 10/24/2022   BUN 13 10/24/2022   CREATININE 0.98 10/24/2022   GLUCOSE 152 (H) 10/24/2022   GFRNONAA 59 (L) 10/24/2022   CALCIUM 9.3 10/24/2022   PROT 7.5 10/24/2022   ALBUMIN 4.0 10/24/2022   BILITOT 1.2 10/24/2022   ALKPHOS 68 10/24/2022   AST 43 (H) 10/24/2022   ALT 39 10/24/2022   ANIONGAP 11  10/24/2022    CBG (last 3)  Recent Labs    10/24/22 2109 10/25/22 0726 10/25/22 1131  GLUCAP 166* 155* 176*      Coagulation Profile: No results for input(s): "INR", "PROTIME" in the last 168 hours.   Radiology Studies: US Venous Img Lower Bilateral (DVT)  Result Date: 10/25/2022 CLINICAL DATA:  Bilateral lower extremity edema and calf pain. EXAM: BILATERAL LOWER EXTREMITY VENOUS DOPPLER  ULTRASOUND TECHNIQUE: Gray-scale sonography with graded compression, as well as color Doppler and duplex ultrasound were performed to evaluate the lower extremity deep venous systems from the level of the common femoral vein and including the common femoral, femoral, profunda femoral, popliteal and calf veins including the posterior tibial, peroneal and gastrocnemius veins when visible. The superficial great saphenous vein was also interrogated. Spectral Doppler was utilized to evaluate flow at rest and with distal augmentation maneuvers in the common femoral, femoral and popliteal veins. COMPARISON:  None Available. FINDINGS: RIGHT LOWER EXTREMITY Common Femoral Vein: No evidence of thrombus. Normal compressibility, respiratory phasicity and response to augmentation. Saphenofemoral Junction: No evidence of thrombus. Normal compressibility and flow on color Doppler imaging. Profunda Femoral Vein: No evidence of thrombus. Normal compressibility and flow on color Doppler imaging. Femoral Vein: No evidence of thrombus. Normal compressibility, respiratory phasicity and response to augmentation. Popliteal Vein: No evidence of thrombus. Normal compressibility, respiratory phasicity and response to augmentation. Calf Veins: No evidence of thrombus. Normal compressibility and flow on color Doppler imaging. Superficial Great Saphenous Vein: No evidence of thrombus. Normal compressibility. Venous Reflux:  None. Other Findings: No evidence of superficial thrombophlebitis or abnormal fluid collection. LEFT LOWER EXTREMITY  Common Femoral Vein: No evidence of thrombus. Normal compressibility, respiratory phasicity and response to augmentation. Saphenofemoral Junction: No evidence of thrombus. Normal compressibility and flow on color Doppler imaging. Profunda Femoral Vein: No evidence of thrombus. Normal compressibility and flow on color Doppler imaging. Femoral Vein: No evidence of thrombus. Normal compressibility, respiratory phasicity and response to augmentation. Popliteal Vein: No evidence of thrombus. Normal compressibility, respiratory phasicity and response to augmentation. Calf Veins: No evidence of thrombus. Normal compressibility and flow on color Doppler imaging. Superficial Great Saphenous Vein: No evidence of thrombus. Normal compressibility. Venous Reflux:  None. Other Findings: No evidence of superficial thrombophlebitis or abnormal fluid collection. IMPRESSION: No evidence of deep venous thrombosis in either lower extremity. Electronically Signed   By: Irish Lack M.D.   On: 10/25/2022 13:33   MR ABDOMEN MRCP W WO CONTAST  Result Date: 10/25/2022 CLINICAL DATA:  Inpatient. Possible liver mass on CT angiogram performed for right upper quadrant abdominal pain. EXAM: MRI ABDOMEN WITHOUT AND WITH CONTRAST (INCLUDING MRCP) TECHNIQUE: Multiplanar multisequence MR imaging of the abdomen was performed both before and after the administration of intravenous contrast. Heavily T2-weighted images of the biliary and pancreatic ducts were obtained, and three-dimensional MRCP images were rendered by post processing. CONTRAST:  7mL GADAVIST GADOBUTROL 1 MMOL/ML IV SOLN COMPARISON:  10/22/2022 CT angiogram of the chest, abdomen and pelvis. FINDINGS: Lower chest: No acute abnormality at the lung bases. Hepatobiliary: There is a dominant poorly marginated heterogeneously enhancing 4.4 x 3.4 cm liver mass centered in central segment 4 left liver (series 24/image 38), with associated marked intrahepatic biliary ductal dilatation  throughout the left liver lobe with relatively nondilated right liver lobe intrahepatic bile ducts. There are numerous similar clustered satellite liver lesions with targetoid enhancement widely scattered throughout the entire left liver lobe and with involvement of portions of the segment 8 right liver lobe and caudate lobe as best seen on diffusion-weighted sequence (series 9/images 11 and 14). Representative 2.1 x 1.3 cm posterior segment 3 left liver mass (series 17/image 46) and representative 1.2 x 1.1 cm liver mass along the borders of segments 4A and 8 (series 17/image 35). No hepatic steatosis. Cholecystectomy. Top-normal caliber common bile duct with diameter 6 mm. No choledocholithiasis. Pancreas: No pancreatic mass or duct dilation.  No  pancreas divisum. Spleen: Normal size. No mass. Adrenals/Urinary Tract: Normal adrenals. No hydronephrosis. Subcentimeter simple bilateral renal cortical cysts, for which no follow-up imaging is recommended. No suspicious renal masses. No hydronephrosis. Stomach/Bowel: Normal non-distended stomach. Visualized small and large bowel is normal caliber, with no bowel wall thickening. Vascular/Lymphatic: Atherosclerotic nonaneurysmal abdominal aorta. Patent portal, splenic, hepatic and renal veins. Mildly enlarged 1.0 cm porta hepatis node (series 19/image 51). No additional pathologically enlarged lymph nodes in the abdomen. Other: No abdominal ascites or focal fluid collection. Musculoskeletal: Indeterminate focus of enhancement in the right T9 posterior elements on coronal postcontrast sequence (series 23/image 67). IMPRESSION: 1. Dominant poorly marginated heterogeneously enhancing 4.4 x 3.4 cm liver mass centered in the central segment 4 left liver with marked intrahepatic biliary ductal dilatation in the left liver lobe. 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 2. Numerous similar enhancing satellite liver masses scattered predominantly in the  left liver lobe with some involvement of the segment 8 right liver and caudate lobe, compatible with widespread liver metastases. 3. Mildly enlarged porta hepatis lymph node, indeterminate, potentially metastatic. 4. Indeterminate focus of enhancement in the right T9 posterior elements, incompletely characterized on this MRI abdomen study, cannot exclude bone metastasis. A dedicated thoracic spine MRI without and with IV contrast is recommended for further evaluation. Alternatively, PET-CT could be obtained for staging evaluation. Electronically Signed   By: Delbert Phenix M.D.   On: 10/25/2022 09:54   MR 3D Recon At Scanner  Result Date: 10/25/2022 CLINICAL DATA:  Inpatient. Possible liver mass on CT angiogram performed for right upper quadrant abdominal pain. EXAM: MRI ABDOMEN WITHOUT AND WITH CONTRAST (INCLUDING MRCP) TECHNIQUE: Multiplanar multisequence MR imaging of the abdomen was performed both before and after the administration of intravenous contrast. Heavily T2-weighted images of the biliary and pancreatic ducts were obtained, and three-dimensional MRCP images were rendered by post processing. CONTRAST:  7mL GADAVIST GADOBUTROL 1 MMOL/ML IV SOLN COMPARISON:  10/22/2022 CT angiogram of the chest, abdomen and pelvis. FINDINGS: Lower chest: No acute abnormality at the lung bases. Hepatobiliary: There is a dominant poorly marginated heterogeneously enhancing 4.4 x 3.4 cm liver mass centered in central segment 4 left liver (series 24/image 38), with associated marked intrahepatic biliary ductal dilatation throughout the left liver lobe with relatively nondilated right liver lobe intrahepatic bile ducts. There are numerous similar clustered satellite liver lesions with targetoid enhancement widely scattered throughout the entire left liver lobe and with involvement of portions of the segment 8 right liver lobe and caudate lobe as best seen on diffusion-weighted sequence (series 9/images 11 and 14).  Representative 2.1 x 1.3 cm posterior segment 3 left liver mass (series 17/image 46) and representative 1.2 x 1.1 cm liver mass along the borders of segments 4A and 8 (series 17/image 35). No hepatic steatosis. Cholecystectomy. Top-normal caliber common bile duct with diameter 6 mm. No choledocholithiasis. Pancreas: No pancreatic mass or duct dilation.  No pancreas divisum. Spleen: Normal size. No mass. Adrenals/Urinary Tract: Normal adrenals. No hydronephrosis. Subcentimeter simple bilateral renal cortical cysts, for which no follow-up imaging is recommended. No suspicious renal masses. No hydronephrosis. Stomach/Bowel: Normal non-distended stomach. Visualized small and large bowel is normal caliber, with no bowel wall thickening. Vascular/Lymphatic: Atherosclerotic nonaneurysmal abdominal aorta. Patent portal, splenic, hepatic and renal veins. Mildly enlarged 1.0 cm porta hepatis node (series 19/image 51). No additional pathologically enlarged lymph nodes in the abdomen. Other: No abdominal ascites or focal fluid collection. Musculoskeletal: Indeterminate focus of enhancement in the right T9 posterior elements  on coronal postcontrast sequence (series 23/image 67). IMPRESSION: 1. Dominant poorly marginated heterogeneously enhancing 4.4 x 3.4 cm liver mass centered in the central segment 4 left liver with marked intrahepatic biliary ductal dilatation in the left liver lobe. 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 2. Numerous similar enhancing satellite liver masses scattered predominantly in the left liver lobe with some involvement of the segment 8 right liver and caudate lobe, compatible with widespread liver metastases. 3. Mildly enlarged porta hepatis lymph node, indeterminate, potentially metastatic. 4. Indeterminate focus of enhancement in the right T9 posterior elements, incompletely characterized on this MRI abdomen study, cannot exclude bone metastasis. A dedicated thoracic spine  MRI without and with IV contrast is recommended for further evaluation. Alternatively, PET-CT could be obtained for staging evaluation. Electronically Signed   By: Delbert Phenix M.D.   On: 10/25/2022 09:54       Kathlen Mody M.D. [facility] 10/25/2022, 2:36 PM  Available via Epic secure chat 7am-7pm After 7 pm, please refer to night coverage provider listed on amion.

## 2022-10-25 NOTE — Consult Note (Signed)
[facility] Consultation Oncology  Name: Erin Hoffman      MRN: [id number]    Location: Q259/D638-75  Date: 10/25/2022 Time:3:51 PM   REFERRING PHYSICIAN: Dr. 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 is a 77 year old female seen in consultation today at the request of Dr. Blake Divine.  She presented with epigastric abdominal pain and right-sided back pain for 3 days.  CT angiogram of the chest/abdomen/pelvis was negative for aortic dissection but showed heterogeneous mass in the left hepatic lobe and mildly enlarged portacaval node.  An MRCP/MRI was done which showed dominant poorly marginated heterogeneous enhancing 4 x 4 x 3.4 cm liver mass centered in the segment 4 of the left lower with marked intrahepatic biliary ductal dilatation of the left lower lobe.  There are other satellite lesions in the left lobe of the liver.  Multiple lung nodules were also seen.  On admission, she was also found to have atrial fibrillation and was started on heparin which is being transitioned to Eliquis.  She denies any weight loss in the last 3 months.  She lives at home by herself and is independent of ADLs and IADLs.  She smoked 1 pack/day for 30 years and quit in the late 1990s.  Her brother had multiple myeloma.  Mother had bone cancer and father had brain cancer.  PAST MEDICAL HISTORY:   Past Medical History:  Diagnosis Date   Anemia    Anxiety    Diabetes mellitus    H/O hiatal hernia    Hypertension    Hypothyroidism     ALLERGIES: Allergies  Allergen Reactions   Tape Itching and Rash      MEDICATIONS: I have reviewed the patient's current medications.     PAST SURGICAL HISTORY Past Surgical History:  Procedure Laterality Date   ABDOMINAL HYSTERECTOMY     CATARACT EXTRACTION W/PHACO Left 06/16/2013   Procedure: CATARACT EXTRACTION PHACO AND INTRAOCULAR LENS PLACEMENT (IOC);  Surgeon:  [name] T. Nile Riggs, MD;  Location: [facility] ORS;  Service: Ophthalmology;  Laterality: Left;  CDE:4.65   COLONOSCOPY N/A 10/01/2014   Procedure: COLONOSCOPY;  Surgeon: Malissa Hippo, MD;  Location: [facility] ENDO SUITE;  Service: Endoscopy;  Laterality: N/A;  1125   TONSILLECTOMY     TUBAL LIGATION      FAMILY HISTORY: Family History  Problem Relation Age of Onset   Brain cancer Father    Crohn's disease Sister    Coronary artery disease Sister    Multiple myeloma Brother    Diabetes Brother    Hypertension Brother    Rheum arthritis Mother     SOCIAL HISTORY:  reports that she quit smoking about 34 years ago. Her smoking use included cigarettes. She started smoking about 64 years ago. She has a 30 pack-year smoking history. She has never used smokeless tobacco. She reports that she does not drink alcohol and does not use drugs.  PERFORMANCE STATUS: The patient's performance status is 1 - Symptomatic but completely ambulatory  PHYSICAL EXAM: Most Recent Vital Signs: Blood pressure 125/62, pulse (!) 57, temperature 98.7 F (37.1 C), resp. rate 17, height 5\' 9"  (1.753 m), weight 181 lb (82.1 kg), SpO2 100%. BP 125/62 (BP Location: Left Arm)   Pulse (!) 57   Temp 98.7 F (37.1 C)   Resp 17   Ht 5\' 9"  (1.753 m)   Wt 181 lb (82.1 kg)   SpO2 100%  BMI 26.73 kg/m  General appearance: alert, cooperative, and appears stated age Lungs: clear to auscultation bilaterally Heart: regular rate and rhythm Extremities:  No edema sinus. Neurologic: Grossly normal  LABORATORY DATA:  Results for orders placed or performed during the hospital encounter of 10/22/22 (from the past 48 hour(s))  Glucose, capillary     Status: Abnormal   Collection Time: 10/23/22  4:29 PM  Result Value Ref Range   Glucose-Capillary 202 (H) 70 - 99 mg/dL    Comment: Glucose reference range applies only to samples taken after fasting for at least 8 hours.  Glucose, capillary     Status: Abnormal   Collection Time: 10/23/22   9:00 PM  Result Value Ref Range   Glucose-Capillary 114 (H) 70 - 99 mg/dL    Comment: Glucose reference range applies only to samples taken after fasting for at least 8 hours.   Comment 1 Notify RN    Comment 2 Document in Chart   Heparin level (unfractionated)     Status: None   Collection Time: 10/23/22 10:55 PM  Result Value Ref Range   Heparin Unfractionated 0.32 0.30 - 0.70 IU/mL    Comment: (NOTE) The clinical reportable range upper limit is being lowered to >1.10 to align with the FDA approved guidance for the current laboratory assay.  If heparin results are below expected values, and patient dosage has  been confirmed, suggest follow up testing of antithrombin III levels. Performed at [facility], [address], [place], [place] [address]   CBC     Status: None   Collection Time: 10/24/22  4:25 AM  Result Value Ref Range   WBC 10.2 4.0 - 10.5 K/uL   RBC 5.02 3.87 - 5.11 MIL/uL   Hemoglobin 14.0 12.0 - 15.0 g/dL   HCT 62.1 30.8 - 65.7 %   MCV 86.9 80.0 - 100.0 fL   MCH 27.9 26.0 - 34.0 pg   MCHC 32.1 30.0 - 36.0 g/dL   RDW 84.6 96.2 - 95.2 %   Platelets 256 150 - 400 K/uL   nRBC 0.0 0.0 - 0.2 %    Comment: Performed at [facility], [address] [place], [place] [address]  Comprehensive metabolic panel     Status: Abnormal   Collection Time: 10/24/22  4:25 AM  Result Value Ref Range   Sodium 137 135 - 145 mmol/L   Potassium 3.5 3.5 - 5.1 mmol/L   Chloride 101 98 - 111 mmol/L   CO2 25 22 - 32 mmol/L   Glucose, Bld 152 (H) 70 - 99 mg/dL    Comment: Glucose reference range applies only to samples taken after fasting for at least 8 hours.   BUN 13 8 - 23 mg/dL   Creatinine, Ser 4.40 0.44 - 1.00 mg/dL   Calcium 9.3 8.9 - 10.2 mg/dL   Total Protein 7.5 6.5 - 8.1 g/dL   Albumin 4.0 3.5 - 5.0 g/dL   AST 43 (H) 15 - 41 U/L   ALT 39 0 - 44 U/L   Alkaline Phosphatase 68 38 - 126 U/L   Total Bilirubin 1.2 0.3 - 1.2 mg/dL   GFR, Estimated 59 (L) >60 mL/min     Comment: (NOTE) Calculated using the CKD-EPI Creatinine Equation (2021)    Anion gap 11 5 - 15    Comment: Performed at [facility], [address] [place], [place] [address]  Magnesium     Status: None   Collection Time: 10/24/22  4:25 AM  Result Value Ref Range   Magnesium 1.7 1.7 - 2.4 mg/dL    Comment: Performed at [facility], [address], [place], [place] [address]  Heparin level (unfractionated)     Status: None   Collection Time: 10/24/22  7:38 AM  Result Value Ref Range   Heparin Unfractionated 0.46 0.30 - 0.70 IU/mL    Comment: (NOTE) The clinical reportable range upper limit is being lowered to >1.10 to align with the FDA approved guidance for the current laboratory assay.  If heparin results are below expected values, and patient dosage has  been confirmed, suggest follow up testing of antithrombin III levels. Performed at [facility], [address], [place], [place] [address]   Glucose, capillary     Status: Abnormal   Collection Time: 10/24/22  7:55 AM  Result Value Ref Range   Glucose-Capillary 184 (H) 70 - 99 mg/dL    Comment: Glucose reference range applies only to samples taken after fasting for at least 8 hours.   Comment 1 Notify RN    Comment 2 Document in Chart   Glucose, capillary     Status: Abnormal   Collection Time: 10/24/22 11:17 AM  Result Value Ref Range   Glucose-Capillary 177 (H) 70 - 99 mg/dL    Comment: Glucose reference range applies only to samples taken after fasting for at least 8 hours.   Comment 1 Notify RN    Comment 2 Document in Chart   Glucose, capillary     Status: Abnormal   Collection Time: 10/24/22  4:52 PM  Result Value Ref Range   Glucose-Capillary 275 (H) 70 - 99 mg/dL    Comment: Glucose reference range applies only to samples taken after fasting for at least 8 hours.   Comment 1 Notify RN    Comment 2 Document in Chart   Glucose, capillary     Status: Abnormal   Collection Time: 10/24/22  9:09 PM  Result Value Ref  Range   Glucose-Capillary 166 (H) 70 - 99 mg/dL    Comment: Glucose reference range applies only to samples taken after fasting for at least 8 hours.   Comment 1 Notify RN    Comment 2 Document in Chart   CBC     Status: None   Collection Time: 10/25/22  5:12 AM  Result Value Ref Range   WBC 7.6 4.0 - 10.5 K/uL   RBC 4.60 3.87 - 5.11 MIL/uL   Hemoglobin 12.8 12.0 - 15.0 g/dL   HCT 57.8 46.9 - 62.9 %   MCV 87.4 80.0 - 100.0 fL   MCH 27.8 26.0 - 34.0 pg   MCHC 31.8 30.0 - 36.0 g/dL   RDW 52.8 41.3 - 24.4 %   Platelets 203 150 - 400 K/uL   nRBC 0.0 0.0 - 0.2 %    Comment: Performed at [facility], [address], [place], [place] [address]  Heparin level (unfractionated)     Status: None   Collection Time: 10/25/22  5:12 AM  Result Value Ref Range   Heparin Unfractionated 0.45 0.30 - 0.70 IU/mL    Comment: (NOTE) The clinical reportable range upper limit is being lowered to >1.10 to align with the FDA approved guidance for the current laboratory assay.  If heparin results are below expected values, and patient dosage has  been confirmed, suggest follow up testing of antithrombin III levels. Performed at [facility], [address] [place], [place] [address]   Glucose, capillary  Status: Abnormal   Collection Time: 10/25/22  7:26 AM  Result Value Ref Range   Glucose-Capillary 155 (H) 70 - 99 mg/dL    Comment: Glucose reference range applies only to samples taken after fasting for at least 8 hours.  Glucose, capillary     Status: Abnormal   Collection Time: 10/25/22 11:31 AM  Result Value Ref Range   Glucose-Capillary 176 (H) 70 - 99 mg/dL    Comment: Glucose reference range applies only to samples taken after fasting for at least 8 hours.      RADIOGRAPHY: US Venous Img Lower Bilateral (DVT)  Result Date: 10/25/2022 CLINICAL DATA:  Bilateral lower extremity edema and calf pain. EXAM: BILATERAL LOWER EXTREMITY VENOUS DOPPLER ULTRASOUND TECHNIQUE: Gray-scale sonography  with graded compression, as well as color Doppler and duplex ultrasound were performed to evaluate the lower extremity deep venous systems from the level of the common femoral vein and including the common femoral, femoral, profunda femoral, popliteal and calf veins including the posterior tibial, peroneal and gastrocnemius veins when visible. The superficial great saphenous vein was also interrogated. Spectral Doppler was utilized to evaluate flow at rest and with distal augmentation maneuvers in the common femoral, femoral and popliteal veins. COMPARISON:  None Available. FINDINGS: RIGHT LOWER EXTREMITY Common Femoral Vein: No evidence of thrombus. Normal compressibility, respiratory phasicity and response to augmentation. Saphenofemoral Junction: No evidence of thrombus. Normal compressibility and flow on color Doppler imaging. Profunda Femoral Vein: No evidence of thrombus. Normal compressibility and flow on color Doppler imaging. Femoral Vein: No evidence of thrombus. Normal compressibility, respiratory phasicity and response to augmentation. Popliteal Vein: No evidence of thrombus. Normal compressibility, respiratory phasicity and response to augmentation. Calf Veins: No evidence of thrombus. Normal compressibility and flow on color Doppler imaging. Superficial Great Saphenous Vein: No evidence of thrombus. Normal compressibility. Venous Reflux:  None. Other Findings: No evidence of superficial thrombophlebitis or abnormal fluid collection. LEFT LOWER EXTREMITY Common Femoral Vein: No evidence of thrombus. Normal compressibility, respiratory phasicity and response to augmentation. Saphenofemoral Junction: No evidence of thrombus. Normal compressibility and flow on color Doppler imaging. Profunda Femoral Vein: No evidence of thrombus. Normal compressibility and flow on color Doppler imaging. Femoral Vein: No evidence of thrombus. Normal compressibility, respiratory phasicity and response to augmentation.  Popliteal Vein: No evidence of thrombus. Normal compressibility, respiratory phasicity and response to augmentation. Calf Veins: No evidence of thrombus. Normal compressibility and flow on color Doppler imaging. Superficial Great Saphenous Vein: No evidence of thrombus. Normal compressibility. Venous Reflux:  None. Other Findings: No evidence of superficial thrombophlebitis or abnormal fluid collection. IMPRESSION: No evidence of deep venous thrombosis in either lower extremity. Electronically Signed   By: Irish Lack M.D.   On: 10/25/2022 13:33   MR ABDOMEN MRCP W WO CONTAST  Result Date: 10/25/2022 CLINICAL DATA:  Inpatient. Possible liver mass on CT angiogram performed for right upper quadrant abdominal pain. EXAM: MRI ABDOMEN WITHOUT AND WITH CONTRAST (INCLUDING MRCP) TECHNIQUE: Multiplanar multisequence MR imaging of the abdomen was performed both before and after the administration of intravenous contrast. Heavily T2-weighted images of the biliary and pancreatic ducts were obtained, and three-dimensional MRCP images were rendered by post processing. CONTRAST:  7mL GADAVIST GADOBUTROL 1 MMOL/ML IV SOLN COMPARISON:  10/22/2022 CT angiogram of the chest, abdomen and pelvis. FINDINGS: Lower chest: No acute abnormality at the lung bases. Hepatobiliary: There is a dominant poorly marginated heterogeneously enhancing 4.4 x 3.4 cm liver mass centered in central segment 4 left liver (series 24/image  38), with associated marked intrahepatic biliary ductal dilatation throughout the left liver lobe with relatively nondilated right liver lobe intrahepatic bile ducts. There are numerous similar clustered satellite liver lesions with targetoid enhancement widely scattered throughout the entire left liver lobe and with involvement of portions of the segment 8 right liver lobe and caudate lobe as best seen on diffusion-weighted sequence (series 9/images 11 and 14). Representative 2.1 x 1.3 cm posterior segment 3 left  liver mass (series 17/image 46) and representative 1.2 x 1.1 cm liver mass along the borders of segments 4A and 8 (series 17/image 35). No hepatic steatosis. Cholecystectomy. Top-normal caliber common bile duct with diameter 6 mm. No choledocholithiasis. Pancreas: No pancreatic mass or duct dilation.  No pancreas divisum. Spleen: Normal size. No mass. Adrenals/Urinary Tract: Normal adrenals. No hydronephrosis. Subcentimeter simple bilateral renal cortical cysts, for which no follow-up imaging is recommended. No suspicious renal masses. No hydronephrosis. Stomach/Bowel: Normal non-distended stomach. Visualized small and large bowel is normal caliber, with no bowel wall thickening. Vascular/Lymphatic: Atherosclerotic nonaneurysmal abdominal aorta. Patent portal, splenic, hepatic and renal veins. Mildly enlarged 1.0 cm porta hepatis node (series 19/image 51). No additional pathologically enlarged lymph nodes in the abdomen. Other: No abdominal ascites or focal fluid collection. Musculoskeletal: Indeterminate focus of enhancement in the right T9 posterior elements on coronal postcontrast sequence (series 23/image 67). IMPRESSION: 1. Dominant poorly marginated heterogeneously enhancing 4.4 x 3.4 cm liver mass centered in the central segment 4 left liver with marked intrahepatic biliary ductal dilatation in the left liver lobe. 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 2. Numerous similar enhancing satellite liver masses scattered predominantly in the left liver lobe with some involvement of the segment 8 right liver and caudate lobe, compatible with widespread liver metastases. 3. Mildly enlarged porta hepatis lymph node, indeterminate, potentially metastatic. 4. Indeterminate focus of enhancement in the right T9 posterior elements, incompletely characterized on this MRI abdomen study, cannot exclude bone metastasis. A dedicated thoracic spine MRI without and with IV contrast is recommended for  further evaluation. Alternatively, PET-CT could be obtained for staging evaluation. Electronically Signed   By: Delbert Phenix M.D.   On: 10/25/2022 09:54   MR 3D Recon At Scanner  Result Date: 10/25/2022 CLINICAL DATA:  Inpatient. Possible liver mass on CT angiogram performed for right upper quadrant abdominal pain. EXAM: MRI ABDOMEN WITHOUT AND WITH CONTRAST (INCLUDING MRCP) TECHNIQUE: Multiplanar multisequence MR imaging of the abdomen was performed both before and after the administration of intravenous contrast. Heavily T2-weighted images of the biliary and pancreatic ducts were obtained, and three-dimensional MRCP images were rendered by post processing. CONTRAST:  7mL GADAVIST GADOBUTROL 1 MMOL/ML IV SOLN COMPARISON:  10/22/2022 CT angiogram of the chest, abdomen and pelvis. FINDINGS: Lower chest: No acute abnormality at the lung bases. Hepatobiliary: There is a dominant poorly marginated heterogeneously enhancing 4.4 x 3.4 cm liver mass centered in central segment 4 left liver (series 24/image 38), with associated marked intrahepatic biliary ductal dilatation throughout the left liver lobe with relatively nondilated right liver lobe intrahepatic bile ducts. There are numerous similar clustered satellite liver lesions with targetoid enhancement widely scattered throughout the entire left liver lobe and with involvement of portions of the segment 8 right liver lobe and caudate lobe as best seen on diffusion-weighted sequence (series 9/images 11 and 14). Representative 2.1 x 1.3 cm posterior segment 3 left liver mass (series 17/image 46) and representative 1.2 x 1.1 cm liver mass along the borders of segments 4A and 8 (series 17/image  35). No hepatic steatosis. Cholecystectomy. Top-normal caliber common bile duct with diameter 6 mm. No choledocholithiasis. Pancreas: No pancreatic mass or duct dilation.  No pancreas divisum. Spleen: Normal size. No mass. Adrenals/Urinary Tract: Normal adrenals. No  hydronephrosis. Subcentimeter simple bilateral renal cortical cysts, for which no follow-up imaging is recommended. No suspicious renal masses. No hydronephrosis. Stomach/Bowel: Normal non-distended stomach. Visualized small and large bowel is normal caliber, with no bowel wall thickening. Vascular/Lymphatic: Atherosclerotic nonaneurysmal abdominal aorta. Patent portal, splenic, hepatic and renal veins. Mildly enlarged 1.0 cm porta hepatis node (series 19/image 51). No additional pathologically enlarged lymph nodes in the abdomen. Other: No abdominal ascites or focal fluid collection. Musculoskeletal: Indeterminate focus of enhancement in the right T9 posterior elements on coronal postcontrast sequence (series 23/image 67). IMPRESSION: 1. Dominant poorly marginated heterogeneously enhancing 4.4 x 3.4 cm liver mass centered in the central segment 4 left liver with marked intrahepatic biliary ductal dilatation in the left liver lobe. 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 2. Numerous similar enhancing satellite liver masses scattered predominantly in the left liver lobe with some involvement of the segment 8 right liver and caudate lobe, compatible with widespread liver metastases. 3. Mildly enlarged porta hepatis lymph node, indeterminate, potentially metastatic. 4. Indeterminate focus of enhancement in the right T9 posterior elements, incompletely characterized on this MRI abdomen study, cannot exclude bone metastasis. A dedicated thoracic spine MRI without and with IV contrast is recommended for further evaluation. Alternatively, PET-CT could be obtained for staging evaluation. Electronically Signed   By: Delbert Phenix M.D.   On: 10/25/2022 09:54        ASSESSMENT and PLAN:  1.  Intrahepatic cholangiocarcinoma: - Presentation: Epigastric abdominal pain (indigestion/burping and right sided back pain) for 3 days. - CT CAP (10/22/2022): Heterogeneous mass in the left hepatic lobe,  mildly enlarged portal caval node and small lymph nodes in the anterior cardiophrenic fat.  Scattered pulmonary nodules, largest 7 mm right apical groundglass nodule. - MRI/MRCP (10/24/2022): Dominant poorly marginated heterogeneous enhancing 4 x 4 x 3.4 cm liver mass centered in the central segment 4 left liver with marked intrahepatic biliary ductal dilatation in the left liver lobe.  Numerous similar enhancing satellite liver masses scattered predominantly in the left lower lobe with some involvement of segment 8 right liver and caudate lobe compatible with widespread liver metastasis.  Mildly enlarged porta hepatis lymph node, indeterminate.  Indeterminate focus of enhancement in the right T9 posterior elements. - CEA: 39, CA 19-9: 381, AFP: 3.1 - I have reviewed MRI images with the patient and her son. - Recommend US guided biopsy by IR. - Will arrange PET CT scan next Thursday as outpatient. - She will follow-up with me 1 week after the biopsy. - Discussed with Dr. Blake Divine.  2.  New onset atrial fibrillation: - Rate controlled.  TTE without significant findings. - She will be transitioned to Eliquis.  All questions were answered. The patient knows to call the clinic with any problems, questions or concerns. We can certainly see the patient much sooner if necessary.   Doreatha Massed

## 2022-10-25 NOTE — Progress Notes (Signed)
Patient complains of pain and tightness in lower left leg. No redness or swelling noted. MD notified.

## 2022-10-26 ENCOUNTER — Other Ambulatory Visit (HOSPITAL_COMMUNITY): Payer: HMO

## 2022-10-26 ENCOUNTER — Ambulatory Visit (HOSPITAL_COMMUNITY)
Admit: 2022-10-26 | Discharge: 2022-10-26 | Disposition: A | Discharge status: Home / Self Care | Payer: HMO | Attending: Internal Medicine | Admitting: Internal Medicine

## 2022-10-26 DIAGNOSIS — K219 Gastro-esophageal reflux disease without esophagitis: Secondary | ICD-10-CM | POA: Diagnosis not present

## 2022-10-26 DIAGNOSIS — I4891 Unspecified atrial fibrillation: Secondary | ICD-10-CM | POA: Diagnosis not present

## 2022-10-26 DIAGNOSIS — R160 Hepatomegaly, not elsewhere classified: Secondary | ICD-10-CM | POA: Diagnosis not present

## 2022-10-26 DIAGNOSIS — E039 Hypothyroidism, unspecified: Secondary | ICD-10-CM | POA: Diagnosis not present

## 2022-10-26 DIAGNOSIS — I10 Essential (primary) hypertension: Secondary | ICD-10-CM | POA: Diagnosis not present

## 2022-10-26 DIAGNOSIS — C229 Malignant neoplasm of liver, not specified as primary or secondary: Secondary | ICD-10-CM | POA: Diagnosis not present

## 2022-10-26 LAB — GLUCOSE, CAPILLARY
Glucose-Capillary: 113 mg/dL — ABNORMAL HIGH (ref 70–99)
Glucose-Capillary: 107 mg/dL — ABNORMAL HIGH (ref 70–99)
Glucose-Capillary: 183 mg/dL — ABNORMAL HIGH (ref 70–99)
Glucose-Capillary: 194 mg/dL — ABNORMAL HIGH (ref 70–99)

## 2022-10-26 LAB — CBC
HCT: 39.1 % (ref 36.0–46.0)
Hemoglobin: 12.6 g/dL (ref 12.0–15.0)
MCH: 28.5 pg (ref 26.0–34.0)
MCHC: 32.2 g/dL (ref 30.0–36.0)
MCV: 88.5 fL (ref 80.0–100.0)
Platelets: 190 10*3/uL (ref 150–400)
RBC: 4.42 MIL/uL (ref 3.87–5.11)
RDW: 14.2 % (ref 11.5–15.5)
WBC: 5.8 10*3/uL (ref 4.0–10.5)
nRBC: 0 % (ref 0.0–0.2)

## 2022-10-26 LAB — PROTIME-INR
INR: 1.1 (ref 0.8–1.2)
Prothrombin Time: 14.3 s (ref 11.4–15.2)

## 2022-10-26 MED ORDER — MIDAZOLAM HCL 2 MG/2ML IJ SOLN
INTRAMUSCULAR | Status: AC | PRN
Start: 2022-10-26 — End: 2022-10-26
  Administered 2022-10-26: .5 mg via INTRAVENOUS

## 2022-10-26 MED ORDER — MAGNESIUM HYDROXIDE 400 MG/5ML PO SUSP: 15.0000 mL | Freq: Every day | ORAL | Status: DC | PRN

## 2022-10-26 MED ORDER — APIXABAN 5 MG PO TABS
5.0000 mg | ORAL_TABLET | Freq: Two times a day (BID) | ORAL | Status: DC
  Administered 2022-10-27: 5 mg via ORAL
  Filled 2022-10-26: qty 1

## 2022-10-26 MED ORDER — MIDAZOLAM HCL 2 MG/2ML IJ SOLN
INTRAMUSCULAR | Status: AC
  Filled 2022-10-26: qty 2

## 2022-10-26 MED ORDER — POLYETHYLENE GLYCOL 3350 17 G PO PACK: 17.0000 g | PACK | Freq: Every day | ORAL | 0 refills | Status: DC

## 2022-10-26 MED ORDER — PANTOPRAZOLE SODIUM 40 MG PO TBEC: 40.0000 mg | DELAYED_RELEASE_TABLET | Freq: Every day | ORAL | 0 refills | Status: AC

## 2022-10-26 MED ORDER — GELATIN ABSORBABLE 12-7 MM EX MISC
1.0000 | Freq: Once | CUTANEOUS | Status: AC
  Administered 2022-10-26: 1 via TOPICAL

## 2022-10-26 MED ORDER — SENNOSIDES-DOCUSATE SODIUM 8.6-50 MG PO TABS
1.0000 | ORAL_TABLET | Freq: Two times a day (BID) | ORAL | Status: DC
  Administered 2022-10-26 – 2022-10-27 (×2): 1 via ORAL
  Filled 2022-10-26 (×2): qty 1

## 2022-10-26 MED ORDER — APIXABAN 5 MG PO TABS: 5.0000 mg | ORAL_TABLET | Freq: Two times a day (BID) | ORAL | 3 refills | Status: DC

## 2022-10-26 MED ORDER — OXYCODONE HCL 5 MG PO TABS: 5.0000 mg | ORAL_TABLET | ORAL | 0 refills | Status: AC | PRN

## 2022-10-26 MED ORDER — BISACODYL 10 MG RE SUPP: 10.0000 mg | Freq: Every day | RECTAL | Status: DC | PRN

## 2022-10-26 MED ORDER — LIDOCAINE-EPINEPHRINE 1 %-1:100000 IJ SOLN
10.0000 mL | Freq: Once | INTRAMUSCULAR | Status: AC
  Administered 2022-10-26: 10 mL via INTRADERMAL

## 2022-10-26 MED ORDER — FENTANYL CITRATE (PF) 100 MCG/2ML IJ SOLN
INTRAMUSCULAR | Status: AC
  Filled 2022-10-26: qty 2

## 2022-10-26 MED ORDER — FENTANYL CITRATE (PF) 100 MCG/2ML IJ SOLN
INTRAMUSCULAR | Status: AC | PRN
  Administered 2022-10-26: 25 ug via INTRAVENOUS

## 2022-10-26 NOTE — Progress Notes (Signed)
[facility] arrived to transport pt back to [facility]. SBAR given to [employer], Charity fundraiser. All questions answered to satisfaction. Pt w/o complaints.

## 2022-10-26 NOTE — Progress Notes (Signed)
[facility]                                                                               Erin Hoffman, is a 77 y.o. female, DOB - 08-03-1945, ZOX:[id number] Admit date - 10/22/2022    Outpatient Primary MD for the patient is Erin Nevins, MD  LOS - 3  days    Brief summary   77 year old F with PMH of DM-2, HTN, HLD, hypothyroidism, GERD and former tobacco smoke presenting with epigastric abdominal pain that she describes as indigestion and burping and right-sided back pain for about 3 days, and admitted with new onset atrial fibrillation with bradycardia and liver lesion.  UA negative for UTI CT angio chest/abdomen/pelvis negative for aortic dissection or aneurysm but heterogeneous mass of the left hepatic lobe and mildly enlarged portal caval node with concern for HCC, and MRI is recommended.  Cardiology consulted for A-fib and recommended starting heparin.  Echocardiogram and MRI ordered.    Assessment & Plan    Assessment and Plan: * Atrial fibrillation (HCC)  Rate better controlled. Rate around 50's/min.  TTE without significant finding. Transitioned to eliquis, discharge on eliquis.    Back Pain:  Indeterminate focus of enhancement in the right T9 posterior elements on the MRI.  Will need MRI of the thoracic , and lumbar spine, but it will be better if she get the PET scan instead.  She agrees.  Pain control.    Poorly marginated Liver mass MRCP ordered, showed  Dominant poorly marginated heterogeneously enhancing 4.4 x 3.4 cm liver mass centered in the central segment 4 left liver with marked intrahepatic biliary ductal dilatation in the left liver lobe. 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 There are other satellite lesions in the left lobe of the liver.  Multiple pulmonary nodules on the CT of the chest, max size of 7 mm. Will probably need a PET scan for further evaluation.  Oncology consulted for a consult and  recommendations given   AFP is negative. CEA and CA 19-9 are elevated.  Has a h/o hysterectomy and cholecystectomy. Pt underwent liver biopsy this afternoon. Plan to restart her anticoagulation in am on discharge.  Pt reports right upper quadrant pain, will discharge her on oral oxycodone.    HTN (hypertension) Optimal.   HLD (hyperlipidemia) Continue with Crestor  GERD (gastroesophageal reflux disease) Continue with Protonix 40 mg daily  DMII (diabetes mellitus, type 2) (HCC) with hyperglycemia, well controlled CBG'S Last A1c is 8.8 Continue with sliding scale insulin and Semglee 20 units at bedtime hold oral medications at this time CBG (last 3)  Recent Labs    10/25/22 2047 10/26/22 0744 10/26/22 1306  GLUCAP 152* 113* 107*     Hypothyroidism TSH within normal limits Resume synthroid.    Estimated body mass index is 26.73 kg/m as calculated from the following:   Height as of this encounter: 5\' 9"  (1.753 m).   Weight as of this encounter: 82.1 kg.  Code Status: Full code DVT Prophylaxis:  SCDs Start: 10/22/22 2348 apixaban (ELIQUIS) tablet 5 mg   Level of Care: Level of care: Telemetry Family Communication: None at bedside, discussed the results with  the patient.   Disposition Plan:     Remains inpatient appropriate: awaiting oncology consult .   Procedures:  MRCP Echocardiogram  Consultants:   Oncology.   Antimicrobials:   Anti-infectives (From admission, onward)    None        Medications  Scheduled Meds:  [START ON 10/27/2022] apixaban  5 mg Oral BID   enalapril  5 mg Oral Daily   famotidine  20 mg Oral Daily   insulin aspart  0-15 Units Subcutaneous TID WC   insulin aspart  0-5 Units Subcutaneous QHS   insulin aspart  4 Units Subcutaneous TID WC   insulin glargine-yfgn  20 Units Subcutaneous QHS   levothyroxine  137 mcg Oral Q0600   pantoprazole  40 mg Oral Daily   polyethylene glycol  17 g Oral Daily   rosuvastatin  10 mg Oral Weekly    Continuous Infusions:   PRN Meds:.acetaminophen **OR** acetaminophen, ALPRAZolam, alum & mag hydroxide-simeth, hydrALAZINE, ondansetron **OR** ondansetron (ZOFRAN) IV, oxyCODONE    Subjective:   Lucella Depaepe was seen and examined today.  Intermittent right upper quadrant pain.  Objective:   Vitals:   10/25/22 1304 10/25/22 2045 10/26/22 0546 10/26/22 1259  BP: 125/62 (!) 129/56 132/73 (!) 160/74  Pulse:  (!) 52  (!) 55  Resp: 17 20 16 16   Temp: 98.7 F (37.1 C) 98.1 F (36.7 C) 98.9 F (37.2 C)   TempSrc:  Oral    SpO2: 100% 98% 97% 95%  Weight:      Height:        Intake/Output Summary (Last 24 hours) at 10/26/2022 1615 Last data filed at 10/26/2022 1330 Gross per 24 hour  Intake 720 ml  Output --  Net 720 ml   Filed Weights   10/22/22 1143 10/23/22 0700  Weight: 81.6 kg 82.1 kg     Exam General exam: Appears calm and comfortable  Respiratory system: Clear to auscultation. Respiratory effort normal. Cardiovascular system: S1 & S2 heard, RRR. No JVD,  Gastrointestinal system: Abdomen is soft mild tenderness in the right upper quadrant .  Central nervous system: Alert and oriented. No focal neurological deficits. Extremities: Symmetric 5 x 5 power. Skin: No rashes,  Psychiatry: Mood & affect appropriate.     Data Reviewed:  I have personally reviewed following labs and imaging studies   CBC Lab Results  Component Value Date   WBC 5.8 10/26/2022   RBC 4.42 10/26/2022   HGB 12.6 10/26/2022   HCT 39.1 10/26/2022   MCV 88.5 10/26/2022   MCH 28.5 10/26/2022   PLT 190 10/26/2022   MCHC 32.2 10/26/2022   RDW 14.2 10/26/2022   LYMPHSABS 1.5 10/23/2022   MONOABS 0.5 10/23/2022   EOSABS 0.2 10/23/2022   BASOSABS 0.0 10/23/2022     Last metabolic panel Lab Results  Component Value Date   NA 137 10/24/2022   K 3.5 10/24/2022   CL 101 10/24/2022   CO2 25 10/24/2022   BUN 13 10/24/2022   CREATININE 0.98 10/24/2022   GLUCOSE 152 (H)  10/24/2022   GFRNONAA 59 (L) 10/24/2022   CALCIUM 9.3 10/24/2022   PROT 7.5 10/24/2022   ALBUMIN 4.0 10/24/2022   BILITOT 1.2 10/24/2022   ALKPHOS 68 10/24/2022   AST 43 (H) 10/24/2022   ALT 39 10/24/2022   ANIONGAP 11 10/24/2022    CBG (last 3)  Recent Labs    10/25/22 2047 10/26/22 0744 10/26/22 1306  GLUCAP 152* 113* 107*      Coagulation  Profile: Recent Labs  Lab 10/26/22 0918  INR 1.1     Radiology Studies: US BIOPSY (LIVER)  Result Date: 10/26/2022 INDICATION: No known primary, now with infiltrative mass involving the majority of the medial segment of the left lobe of the liver worrisome cholangiocarcinoma. Please perform ultrasound-guided biopsy for tissue diagnostic purposes. EXAM: ULTRASOUND GUIDED LIVER LESION BIOPSY COMPARISON:  Abdominal MRI-10/24/2022; CT of the chest, abdomen and pelvis-10/22/2022 MEDICATIONS: None ANESTHESIA/SEDATION: Moderate (conscious) sedation was employed during this procedure as administered by the Interventional Radiology RN. A total of Versed 0.5 mg and Fentanyl 25 mcg was administered intravenously. Moderate Sedation Time: 11 minutes. The patient's level of consciousness and vital signs were monitored continuously by radiology nursing throughout the procedure under my direct supervision. COMPLICATIONS: None immediate. PROCEDURE: Informed written consent was obtained from the patient after a discussion of the risks, benefits and alternatives to treatment. The patient understands and consents the procedure. A timeout was performed prior to the initiation of the procedure. Ultrasound scanning was performed of the right upper abdominal quadrant demonstrates an ill-defined hypoechoic mass involving the medial segment of the left lobe of the liver (image 4) corresponding with the ill-defined lesion seen on preceding abdominal MRI and CT. The procedure was planned. The right upper abdominal quadrant was prepped and draped in the usual sterile  fashion. The overlying soft tissues were anesthetized with 1% lidocaine with epinephrine. A 17 gauge, 6.8 cm co-axial needle was advanced into a peripheral aspect of the lesion. This was followed by 6 core biopsies with an 18 gauge core device under direct ultrasound guidance. The coaxial needle tract was embolized with a small amount of Gel-Foam slurry and superficial hemostasis was obtained with manual compression. Post procedural scanning was negative for definitive area of hemorrhage or additional complication. A dressing was placed. The patient tolerated the procedure well without immediate post procedural complication. IMPRESSION: Technically successful ultrasound guided core needle biopsy of indeterminate infiltrative lesion involving the majority of the medial segment of the left lobe of the liver. Electronically Signed   By: Simonne Come M.D.   On: 10/26/2022 14:39   US Venous Img Lower Bilateral (DVT)  Result Date: 10/25/2022 CLINICAL DATA:  Bilateral lower extremity edema and calf pain. EXAM: BILATERAL LOWER EXTREMITY VENOUS DOPPLER ULTRASOUND TECHNIQUE: Gray-scale sonography with graded compression, as well as color Doppler and duplex ultrasound were performed to evaluate the lower extremity deep venous systems from the level of the common femoral vein and including the common femoral, femoral, profunda femoral, popliteal and calf veins including the posterior tibial, peroneal and gastrocnemius veins when visible. The superficial great saphenous vein was also interrogated. Spectral Doppler was utilized to evaluate flow at rest and with distal augmentation maneuvers in the common femoral, femoral and popliteal veins. COMPARISON:  None Available. FINDINGS: RIGHT LOWER EXTREMITY Common Femoral Vein: No evidence of thrombus. Normal compressibility, respiratory phasicity and response to augmentation. Saphenofemoral Junction: No evidence of thrombus. Normal compressibility and flow on color Doppler  imaging. Profunda Femoral Vein: No evidence of thrombus. Normal compressibility and flow on color Doppler imaging. Femoral Vein: No evidence of thrombus. Normal compressibility, respiratory phasicity and response to augmentation. Popliteal Vein: No evidence of thrombus. Normal compressibility, respiratory phasicity and response to augmentation. Calf Veins: No evidence of thrombus. Normal compressibility and flow on color Doppler imaging. Superficial Great Saphenous Vein: No evidence of thrombus. Normal compressibility. Venous Reflux:  None. Other Findings: No evidence of superficial thrombophlebitis or abnormal fluid collection. LEFT LOWER EXTREMITY Common Femoral  Vein: No evidence of thrombus. Normal compressibility, respiratory phasicity and response to augmentation. Saphenofemoral Junction: No evidence of thrombus. Normal compressibility and flow on color Doppler imaging. Profunda Femoral Vein: No evidence of thrombus. Normal compressibility and flow on color Doppler imaging. Femoral Vein: No evidence of thrombus. Normal compressibility, respiratory phasicity and response to augmentation. Popliteal Vein: No evidence of thrombus. Normal compressibility, respiratory phasicity and response to augmentation. Calf Veins: No evidence of thrombus. Normal compressibility and flow on color Doppler imaging. Superficial Great Saphenous Vein: No evidence of thrombus. Normal compressibility. Venous Reflux:  None. Other Findings: No evidence of superficial thrombophlebitis or abnormal fluid collection. IMPRESSION: No evidence of deep venous thrombosis in either lower extremity. Electronically Signed   By: Irish Lack M.D.   On: 10/25/2022 13:33       Kathlen Mody M.D. [facility] 10/26/2022, 4:15 PM  Available via Epic secure chat 7am-7pm After 7 pm, please refer to night coverage provider listed on amion.

## 2022-10-26 NOTE — Consult Note (Signed)
Chief Complaint: Patient was seen in consultation today for  Chief Complaint  Patient presents with   Back Pain   Abdominal Pain   Referring Physician(s): Dr. Blake Divine   Supervising Physician: Simonne Come  Patient Status: [facility] - Inpatient   History of Present Illness: HENDY SONNE is a 77 y.o. female with a medical history significant for anxiety, DM, HTN, and hypothyroidism. She presented to the ED with complaints of epigastric and back pain along with constipation. She was found to be in atrial fibrillation and imaging showed a liver mass.   MR Abdomen MRCP 10/23/22 IMPRESSION: 1. Dominant poorly marginated heterogeneously enhancing 4.4 x 3.4 cm liver mass centered in the central segment 4 left liver with marked intrahepatic biliary ductal dilatation in the left liver lobe. 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 2. Numerous similar enhancing satellite liver masses scattered predominantly in the left liver lobe with some involvement of the segment 8 right liver and caudate lobe, compatible with widespread liver metastases. 3. Mildly enlarged porta hepatis lymph node, indeterminate, potentially metastatic. 4. Indeterminate focus of enhancement in the right T9 posterior elements, incompletely characterized on this MRI abdomen study, cannot exclude bone metastasis. A dedicated thoracic spine MRI without and with IV contrast is recommended for further evaluation. Alternatively, PET-CT could be obtained for staging evaluation.  Interventional Radiology has been asked to evaluate this patient for an image-guided liver lesion biopsy. Imaging reviewed and procedure approved by Dr. Milford Cage.   Past Medical History:  Diagnosis Date   Anemia    Anxiety    Diabetes mellitus    H/O hiatal hernia    Hypertension    Hypothyroidism     Past Surgical History:  Procedure Laterality Date   ABDOMINAL HYSTERECTOMY     CATARACT EXTRACTION W/PHACO  Left 06/16/2013   Procedure: CATARACT EXTRACTION PHACO AND INTRAOCULAR LENS PLACEMENT (IOC);  Surgeon: [name] T. Nile Riggs, MD;  Location: [facility];  Service: Ophthalmology;  Laterality: Left;  CDE:4.65   COLONOSCOPY N/A 10/01/2014   Procedure: COLONOSCOPY;  Surgeon: Malissa Hippo, MD;  Location: [facility];  Service: Endoscopy;  Laterality: N/A;  1125   TONSILLECTOMY     TUBAL LIGATION      Allergies: Tape  Medications: Prior to Admission medications   Medication Sig Start Date End Date Taking? Authorizing Provider  acetaminophen (TYLENOL) 500 MG tablet Take 500 mg by mouth every 6 (six) hours as needed for moderate pain (pain score 4-6).   Yes [provider]  ALPRAZolam Prudy Feeler) 0.5 MG tablet Take 0.5 mg by mouth at bedtime as needed for anxiety.   Yes [provider]  cyanocobalamin (,VITAMIN B-12,) 1000 MCG/ML injection Inject 1,000 mcg into the muscle every 30 (thirty) days.   Yes [provider]  enalapril (VASOTEC) 5 MG tablet Take 5 mg by mouth daily.   Yes [provider]  famotidine (PEPCID) 20 MG tablet Take 20 mg by mouth 2 (two) times daily as needed. 10/17/22  Yes [provider]  fenofibrate micronized (LOFIBRA) 134 MG capsule Take 134 mg by mouth daily before breakfast.   Yes [provider]  fexofenadine (ALLEGRA) 180 MG tablet Take 180 mg by mouth daily as needed for allergies or rhinitis.   Yes [provider]  glimepiride (AMARYL) 4 MG tablet Take 4 mg by mouth 2 (two) times daily.   Yes [provider]  insulin glargine (LANTUS) 100 UNIT/ML injection Inject 40 Units into the skin at bedtime as  needed (high blood sugar).   Yes [provider]  levothyroxine (SYNTHROID, LEVOTHROID) 137 MCG tablet Take 137 mcg by mouth daily before breakfast.   Yes [provider]  metFORMIN (GLUCOPHAGE) 500 MG tablet Take 500 mg by mouth 2 (two) times daily with a meal.   Yes [provider]   Omega-3 Fatty Acids (FISH OIL) 1200 MG CAPS Take 1 capsule by mouth daily.    Yes [provider]  rosuvastatin (CRESTOR) 10 MG tablet Take 10 mg by mouth once a week. 09/13/22  Yes [provider]  traMADol (ULTRAM) 50 MG tablet Take by mouth every 6 (six) hours as needed for moderate pain (pain score 4-6).   Yes [provider]     Family History  Problem Relation Age of Onset   Brain cancer Father    Crohn's disease Sister    Coronary artery disease Sister    Multiple myeloma Brother    Diabetes Brother    Hypertension Brother    Rheum arthritis Mother     Social History   Socioeconomic History   Marital status: Divorced    Spouse name: Not on file   Number of children: Not on file   Years of education: Not on file   Highest education level: Not on file  Occupational History   Not on file  Tobacco Use   Smoking status: Former    Current packs/day: 0.00    Average packs/day: 1 pack/day for 30.0 years (30.0 ttl pk-yrs)    Types: Cigarettes    Start date: 06/11/1958    Quit date: 06/10/1988    Years since quitting: 34.4   Smokeless tobacco: Never  Substance and Sexual Activity   Alcohol use: No   Drug use: No   Sexual activity: Yes    Birth control/protection: Surgical  Other Topics Concern   Not on file  Social History Narrative   Not on file   Social Determinants of Health   Financial Resource Strain: Not on file  Food Insecurity: No Food Insecurity (10/23/2022)   Hunger Vital Sign    Worried About Running Out of Food in the Last Year: Never true    Ran Out of Food in the Last Year: Never true  Transportation Needs: No Transportation Needs (10/23/2022)   PRAPARE - Administrator, Civil Service (Medical): No    Lack of Transportation (Non-Medical): No  Physical Activity: Not on file  Stress: Not on file  Social Connections: Not on file    Review of Systems: A 12 point ROS discussed and pertinent positives are indicated in  the HPI above.  All other systems are negative.  Review of Systems  Constitutional:  Negative for appetite change and fever.  Respiratory:  Negative for cough and shortness of breath.   Cardiovascular:  Negative for chest pain and leg swelling.  Gastrointestinal:  Positive for abdominal pain. Negative for diarrhea, nausea and vomiting.  Musculoskeletal:  Positive for back pain.  Neurological:  Negative for dizziness and headaches.    Vital Signs: BP 132/73 (BP Location: Left Arm)   Pulse (!) 52   Temp 98.9 F (37.2 C)   Resp 16   Ht 5\' 9"  (1.753 m)   Wt 181 lb (82.1 kg)   SpO2 97%   BMI 26.73 kg/m   Physical Exam Constitutional:      General: She is not in acute distress.    Appearance: She is not ill-appearing.  HENT:  Mouth/Throat:     Mouth: Mucous membranes are moist.     Pharynx: Oropharynx is clear.  Cardiovascular:     Rate and Rhythm: Bradycardia present.  Pulmonary:     Effort: Pulmonary effort is normal.  Abdominal:     Tenderness: There is no abdominal tenderness.  Neurological:     Mental Status: She is alert and oriented to person, place, and time.  Psychiatric:        Mood and Affect: Mood normal.        Behavior: Behavior normal.        Thought Content: Thought content normal.        Judgment: Judgment normal.    Imaging: US Venous Img Lower Bilateral (DVT)  Result Date: 10/25/2022 CLINICAL DATA:  Bilateral lower extremity edema and calf pain. EXAM: BILATERAL LOWER EXTREMITY VENOUS DOPPLER ULTRASOUND TECHNIQUE: Gray-scale sonography with graded compression, as well as color Doppler and duplex ultrasound were performed to evaluate the lower extremity deep venous systems from the level of the common femoral vein and including the common femoral, femoral, profunda femoral, popliteal and calf veins including the posterior tibial, peroneal and gastrocnemius veins when visible. The superficial great saphenous vein was also interrogated. Spectral Doppler  was utilized to evaluate flow at rest and with distal augmentation maneuvers in the common femoral, femoral and popliteal veins. COMPARISON:  None Available. FINDINGS: RIGHT LOWER EXTREMITY Common Femoral Vein: No evidence of thrombus. Normal compressibility, respiratory phasicity and response to augmentation. Saphenofemoral Junction: No evidence of thrombus. Normal compressibility and flow on color Doppler imaging. Profunda Femoral Vein: No evidence of thrombus. Normal compressibility and flow on color Doppler imaging. Femoral Vein: No evidence of thrombus. Normal compressibility, respiratory phasicity and response to augmentation. Popliteal Vein: No evidence of thrombus. Normal compressibility, respiratory phasicity and response to augmentation. Calf Veins: No evidence of thrombus. Normal compressibility and flow on color Doppler imaging. Superficial Great Saphenous Vein: No evidence of thrombus. Normal compressibility. Venous Reflux:  None. Other Findings: No evidence of superficial thrombophlebitis or abnormal fluid collection. LEFT LOWER EXTREMITY Common Femoral Vein: No evidence of thrombus. Normal compressibility, respiratory phasicity and response to augmentation. Saphenofemoral Junction: No evidence of thrombus. Normal compressibility and flow on color Doppler imaging. Profunda Femoral Vein: No evidence of thrombus. Normal compressibility and flow on color Doppler imaging. Femoral Vein: No evidence of thrombus. Normal compressibility, respiratory phasicity and response to augmentation. Popliteal Vein: No evidence of thrombus. Normal compressibility, respiratory phasicity and response to augmentation. Calf Veins: No evidence of thrombus. Normal compressibility and flow on color Doppler imaging. Superficial Great Saphenous Vein: No evidence of thrombus. Normal compressibility. Venous Reflux:  None. Other Findings: No evidence of superficial thrombophlebitis or abnormal fluid collection. IMPRESSION: No  evidence of deep venous thrombosis in either lower extremity. Electronically Signed   By: Irish Lack M.D.   On: 10/25/2022 13:33   MR ABDOMEN MRCP W WO CONTAST  Result Date: 10/25/2022 CLINICAL DATA:  Inpatient. Possible liver mass on CT angiogram performed for right upper quadrant abdominal pain. EXAM: MRI ABDOMEN WITHOUT AND WITH CONTRAST (INCLUDING MRCP) TECHNIQUE: Multiplanar multisequence MR imaging of the abdomen was performed both before and after the administration of intravenous contrast. Heavily T2-weighted images of the biliary and pancreatic ducts were obtained, and three-dimensional MRCP images were rendered by post processing. CONTRAST:  7mL GADAVIST GADOBUTROL 1 MMOL/ML IV SOLN COMPARISON:  10/22/2022 CT angiogram of the chest, abdomen and pelvis. FINDINGS: Lower chest: No acute abnormality at the lung bases. Hepatobiliary: There  is a dominant poorly marginated heterogeneously enhancing 4.4 x 3.4 cm liver mass centered in central segment 4 left liver (series 24/image 38), with associated marked intrahepatic biliary ductal dilatation throughout the left liver lobe with relatively nondilated right liver lobe intrahepatic bile ducts. There are numerous similar clustered satellite liver lesions with targetoid enhancement widely scattered throughout the entire left liver lobe and with involvement of portions of the segment 8 right liver lobe and caudate lobe as best seen on diffusion-weighted sequence (series 9/images 11 and 14). Representative 2.1 x 1.3 cm posterior segment 3 left liver mass (series 17/image 46) and representative 1.2 x 1.1 cm liver mass along the borders of segments 4A and 8 (series 17/image 35). No hepatic steatosis. Cholecystectomy. Top-normal caliber common bile duct with diameter 6 mm. No choledocholithiasis. Pancreas: No pancreatic mass or duct dilation.  No pancreas divisum. Spleen: Normal size. No mass. Adrenals/Urinary Tract: Normal adrenals. No hydronephrosis.  Subcentimeter simple bilateral renal cortical cysts, for which no follow-up imaging is recommended. No suspicious renal masses. No hydronephrosis. Stomach/Bowel: Normal non-distended stomach. Visualized small and large bowel is normal caliber, with no bowel wall thickening. Vascular/Lymphatic: Atherosclerotic nonaneurysmal abdominal aorta. Patent portal, splenic, hepatic and renal veins. Mildly enlarged 1.0 cm porta hepatis node (series 19/image 51). No additional pathologically enlarged lymph nodes in the abdomen. Other: No abdominal ascites or focal fluid collection. Musculoskeletal: Indeterminate focus of enhancement in the right T9 posterior elements on coronal postcontrast sequence (series 23/image 67). IMPRESSION: 1. Dominant poorly marginated heterogeneously enhancing 4.4 x 3.4 cm liver mass centered in the central segment 4 left liver with marked intrahepatic biliary ductal dilatation in the left liver lobe. 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 2. Numerous similar enhancing satellite liver masses scattered predominantly in the left liver lobe with some involvement of the segment 8 right liver and caudate lobe, compatible with widespread liver metastases. 3. Mildly enlarged porta hepatis lymph node, indeterminate, potentially metastatic. 4. Indeterminate focus of enhancement in the right T9 posterior elements, incompletely characterized on this MRI abdomen study, cannot exclude bone metastasis. A dedicated thoracic spine MRI without and with IV contrast is recommended for further evaluation. Alternatively, PET-CT could be obtained for staging evaluation. Electronically Signed   By: Delbert Phenix M.D.   On: 10/25/2022 09:54   MR 3D Recon At Scanner  Result Date: 10/25/2022 CLINICAL DATA:  Inpatient. Possible liver mass on CT angiogram performed for right upper quadrant abdominal pain. EXAM: MRI ABDOMEN WITHOUT AND WITH CONTRAST (INCLUDING MRCP) TECHNIQUE: Multiplanar  multisequence MR imaging of the abdomen was performed both before and after the administration of intravenous contrast. Heavily T2-weighted images of the biliary and pancreatic ducts were obtained, and three-dimensional MRCP images were rendered by post processing. CONTRAST:  7mL GADAVIST GADOBUTROL 1 MMOL/ML IV SOLN COMPARISON:  10/22/2022 CT angiogram of the chest, abdomen and pelvis. FINDINGS: Lower chest: No acute abnormality at the lung bases. Hepatobiliary: There is a dominant poorly marginated heterogeneously enhancing 4.4 x 3.4 cm liver mass centered in central segment 4 left liver (series 24/image 38), with associated marked intrahepatic biliary ductal dilatation throughout the left liver lobe with relatively nondilated right liver lobe intrahepatic bile ducts. There are numerous similar clustered satellite liver lesions with targetoid enhancement widely scattered throughout the entire left liver lobe and with involvement of portions of the segment 8 right liver lobe and caudate lobe as best seen on diffusion-weighted sequence (series 9/images 11 and 14). Representative 2.1 x 1.3 cm posterior segment 3 left liver  mass (series 17/image 46) and representative 1.2 x 1.1 cm liver mass along the borders of segments 4A and 8 (series 17/image 35). No hepatic steatosis. Cholecystectomy. Top-normal caliber common bile duct with diameter 6 mm. No choledocholithiasis. Pancreas: No pancreatic mass or duct dilation.  No pancreas divisum. Spleen: Normal size. No mass. Adrenals/Urinary Tract: Normal adrenals. No hydronephrosis. Subcentimeter simple bilateral renal cortical cysts, for which no follow-up imaging is recommended. No suspicious renal masses. No hydronephrosis. Stomach/Bowel: Normal non-distended stomach. Visualized small and large bowel is normal caliber, with no bowel wall thickening. Vascular/Lymphatic: Atherosclerotic nonaneurysmal abdominal aorta. Patent portal, splenic, hepatic and renal veins. Mildly  enlarged 1.0 cm porta hepatis node (series 19/image 51). No additional pathologically enlarged lymph nodes in the abdomen. Other: No abdominal ascites or focal fluid collection. Musculoskeletal: Indeterminate focus of enhancement in the right T9 posterior elements on coronal postcontrast sequence (series 23/image 67). IMPRESSION: 1. Dominant poorly marginated heterogeneously enhancing 4.4 x 3.4 cm liver mass centered in the central segment 4 left liver with marked intrahepatic biliary ductal dilatation in the left liver lobe. 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 2. Numerous similar enhancing satellite liver masses scattered predominantly in the left liver lobe with some involvement of the segment 8 right liver and caudate lobe, compatible with widespread liver metastases. 3. Mildly enlarged porta hepatis lymph node, indeterminate, potentially metastatic. 4. Indeterminate focus of enhancement in the right T9 posterior elements, incompletely characterized on this MRI abdomen study, cannot exclude bone metastasis. A dedicated thoracic spine MRI without and with IV contrast is recommended for further evaluation. Alternatively, PET-CT could be obtained for staging evaluation. Electronically Signed   By: Delbert Phenix M.D.   On: 10/25/2022 09:54   ECHOCARDIOGRAM COMPLETE  Result Date: 10/23/2022    ECHOCARDIOGRAM REPORT   Patient Name:   JAYANNA EVERY Date of Exam: 10/23/2022 Medical Rec #:  [id number]          Height:       69.0 in Accession #:    [id number]         Weight:       181.0 lb Date of Birth:  09/20/1945          BSA:          1.980 m Patient Age:    77 years           BP:           195/93 mmHg Patient Gender: F                  HR:           45 bpm. [facility]am [facility] Procedure: 2D Echo, Cardiac Doppler and Color Doppler Indications:    Atrial Fibrillation  History:        Patient has no prior history of Echocardiogram examinations.                  Arrythmias:Atrial Fibrillation; Risk Factors:Hypertension,                 Diabetes and Dyslipidemia.  Sonographer:    Mikki Harbor Referring Phys: [id number] ASIA B ZIERLE-GHOSH  Sonographer Comments: Image acquisition challenging due to respiratory motion. IMPRESSIONS  1. Left ventricular ejection fraction, by estimation, is 60 to 65%. The left ventricle has normal function. The left ventricle has no regional wall motion abnormalities. There is mild left ventricular hypertrophy. Left ventricular diastolic parameters are indeterminate.  2. Right ventricular systolic function is  normal. The right ventricular size is normal. There is mildly elevated pulmonary artery systolic pressure.  3. Left atrial size was mildly dilated.  4. The mitral valve is abnormal. Mild mitral valve regurgitation. No evidence of mitral stenosis.  5. The tricuspid valve is abnormal. Tricuspid valve regurgitation is mild to moderate.  6. The aortic valve is tricuspid. There is mild calcification of the aortic valve. There is mild thickening of the aortic valve. Aortic valve regurgitation is not visualized. No aortic stenosis is present.  7. The inferior vena cava is normal in size with greater than 50% respiratory variability, suggesting right atrial pressure of 3 mmHg. FINDINGS  Left Ventricle: Left ventricular ejection fraction, by estimation, is 60 to 65%. The left ventricle has normal function. The left ventricle has no regional wall motion abnormalities. The left ventricular internal cavity size was normal in size. There is  mild left ventricular hypertrophy. Left ventricular diastolic parameters are indeterminate. Right Ventricle: The right ventricular size is normal. Right vetricular wall thickness was not well visualized. Right ventricular systolic function is normal. There is mildly elevated pulmonary artery systolic pressure. The tricuspid regurgitant velocity  is 3.02 m/s, and with an assumed right atrial pressure of 8 mmHg, the  estimated right ventricular systolic pressure is 44.5 mmHg. Left Atrium: Left atrial size was mildly dilated. Right Atrium: Right atrial size was normal in size. Pericardium: There is no evidence of pericardial effusion. Mitral Valve: The mitral valve is abnormal. Mild mitral valve regurgitation. No evidence of mitral valve stenosis. MV peak gradient, 3.8 mmHg. The mean mitral valve gradient is 1.0 mmHg. Tricuspid Valve: The tricuspid valve is abnormal. Tricuspid valve regurgitation is mild to moderate. No evidence of tricuspid stenosis. Aortic Valve: The aortic valve is tricuspid. There is mild calcification of the aortic valve. There is mild thickening of the aortic valve. There is mild aortic valve annular calcification. Aortic valve regurgitation is not visualized. No aortic stenosis  is present. Aortic valve mean gradient measures 4.5 mmHg. Aortic valve peak gradient measures 10.4 mmHg. Aortic valve area, by VTI measures 2.10 cm. Pulmonic Valve: The pulmonic valve was not well visualized. Pulmonic valve regurgitation is not visualized. No evidence of pulmonic stenosis. Aorta: The aortic root is normal in size and structure. Venous: The inferior vena cava is normal in size with greater than 50% respiratory variability, suggesting right atrial pressure of 3 mmHg. IAS/Shunts: No atrial level shunt detected by color flow Doppler.  LEFT VENTRICLE PLAX 2D LVIDd:         5.10 cm   Diastology LVIDs:         3.70 cm   LV e' medial:    7.40 cm/s LV PW:         1.10 cm   LV E/e' medial:  11.0 LV IVS:        1.10 cm   LV e' lateral:   13.20 cm/s LVOT diam:     1.90 cm   LV E/e' lateral: 6.2 LV SV:         81 LV SV Index:   41 LVOT Area:     2.84 cm  RIGHT VENTRICLE RV Basal diam:  3.95 cm RV Mid diam:    3.40 cm RV S prime:     10.00 cm/s LEFT ATRIUM           Index        RIGHT ATRIUM           Index LA diam:  4.10 cm 2.07 cm/m   RA Area:     20.70 cm LA Vol (A2C): 87.2 ml 44.04 ml/m  RA Volume:   63.20 ml  31.92  ml/m LA Vol (A4C): 74.7 ml 37.72 ml/m  AORTIC VALVE                    PULMONIC VALVE AV Area (Vmax):    2.10 cm     PV Vmax:       0.68 m/s AV Area (Vmean):   2.14 cm     PV Peak grad:  1.8 mmHg AV Area (VTI):     2.10 cm AV Vmax:           161.00 cm/s AV Vmean:          99.650 cm/s AV VTI:            0.386 m AV Peak Grad:      10.4 mmHg AV Mean Grad:      4.5 mmHg LVOT Vmax:         119.00 cm/s LVOT Vmean:        75.200 cm/s LVOT VTI:          0.285 m LVOT/AV VTI ratio: 0.74  AORTA Ao Root diam: 3.10 cm MITRAL VALVE               TRICUSPID VALVE MV Area (PHT): 3.06 cm    TR Peak grad:   36.5 mmHg MV Area VTI:   2.56 cm    TR Vmax:        302.00 cm/s MV Peak grad:  3.8 mmHg MV Mean grad:  1.0 mmHg    SHUNTS MV Vmax:       0.98 m/s    Systemic VTI:  0.29 m MV Vmean:      32.1 cm/s   Systemic Diam: 1.90 cm MV Decel Time: 248 msec MV E velocity: 81.40 cm/s Dina Rich MD Electronically signed by Dina Rich MD Signature Date/Time: 10/23/2022/12:40:48 PM    Final    CT Angio Chest/Abd/Pel for Dissection W and/or Wo Contrast  Result Date: 10/22/2022 CLINICAL DATA:  Right upper abdominal pain radiating to the back EXAM: CT ANGIOGRAPHY CHEST, ABDOMEN AND PELVIS TECHNIQUE: Non-contrast CT of the chest was initially obtained. Multidetector CT imaging through the chest, abdomen and pelvis was performed using the standard protocol during bolus administration of intravenous contrast. Multiplanar reconstructed images and MIPs were obtained and reviewed to evaluate the vascular anatomy. RADIATION DOSE REDUCTION: This exam was performed according to the departmental dose-optimization program which includes automated exposure control, adjustment of the mA and/or kV according to patient size and/or use of iterative reconstruction technique. CONTRAST:  OMNIPAQUE IOHEXOL 300 MG/ML  SOLN COMPARISON:  CT 04/28/2015 FINDINGS: CTA CHEST FINDINGS Cardiovascular: Non contrasted images of the chest demonstrate no  acute intramural hematoma. Nonaneurysmal aorta. Normal cardiac size. No pericardial effusion. No dissection. Small amount of aortic atherosclerosis. Minimal coronary vascular calcification. Mediastinum/Nodes: Midline trachea. No thyroid mass. No suspicious lymph nodes. Esophagus within normal limits. Lungs/Pleura: Emphysema. No acute airspace disease, pleural effusion or pneumothorax. Scattered pulmonary nodules, for example 3 mm left apical lung nodule on series 7, image 37. Right apical ground-glass pulmonary nodule measuring 7 mm on series 7, image 49. Musculoskeletal: No acute or suspicious osseous abnormality Review of the MIP images confirms the above findings. CTA ABDOMEN AND PELVIS FINDINGS VASCULAR Aorta: Normal caliber aorta without aneurysm, dissection, vasculitis or significant stenosis. Celiac: Patent without  evidence of aneurysm, dissection, vasculitis or significant stenosis. SMA: Patent without evidence of aneurysm, dissection, vasculitis or significant stenosis. Renals: Both renal arteries are patent without evidence of aneurysm, dissection, vasculitis, fibromuscular dysplasia or significant stenosis. IMA: Patent without evidence of aneurysm, dissection, vasculitis or significant stenosis. Inflow: Patent without evidence of aneurysm, dissection, vasculitis or significant stenosis. Veins: Not adequately assessed Review of the MIP images confirms the above findings. NON-VASCULAR Hepatobiliary: Status post cholecystectomy CBD measures 8 mm, within normal limits for post cholecystectomy patient. Poorly defined heterogenous mass is suspected in the left hepatic lobe. Somewhat wedge-shaped hypodensity at the left hepatic lobe more superior could reflect altered perfusion. Pancreas: Unremarkable. No pancreatic ductal dilatation or surrounding inflammatory changes. Spleen: Normal in size without focal abnormality. Adrenals/Urinary Tract: Adrenal glands are unremarkable. Kidneys are normal, without renal  calculi, focal lesion, or hydronephrosis. Bladder is unremarkable. Stomach/Bowel: Stomach is within normal limits. Appendix appears normal. No evidence of bowel wall thickening, distention, or inflammatory changes. Lymphatic: 13 mm portal caval node on series 5, image 121. 7 mm distal right Peri aortic node on series 5, image 145. Small subcentimeter lymph nodes in the anterior cardio phrenic fat. These measure up to 11 mm on coronal series 11, image 30. Reproductive: Hysterectomy.  No adnexal mass Other: Negative for pelvic effusion or free air. Musculoskeletal: No acute or suspicious osseous abnormality Review of the MIP images confirms the above findings. IMPRESSION: 1. Negative for acute aortic dissection or aneurysm. 2. Suspicion of a defined heterogenous mass is suspected in the left hepatic lobe, but limited assessment secondary to phase of contrast enhancement. Further evaluation with MRI is suggested. 3. Mildly enlarged portal caval node and small lymph nodes in the anterior cardiophrenic fat, nonspecific. 4. Emphysema. Scattered pulmonary nodules, largest is a 7 mm right apical ground-glass nodule, these are potentially not incidental given the findings in the liver. 5. Aortic atherosclerosis. Aortic Atherosclerosis (ICD10-I70.0) and Emphysema (ICD10-J43.9). Electronically Signed   By: Jasmine Pang M.D.   On: 10/22/2022 20:24    Labs:  CBC: Recent Labs    10/23/22 0647 10/24/22 0425 10/25/22 0512 10/26/22 0505  WBC 8.5 10.2 7.6 5.8  HGB 13.4 14.0 12.8 12.6  HCT 40.0 43.6 40.2 39.1  PLT 220 256 203 190    COAGS: No results for input(s): "INR", "APTT" in the last 8760 hours.  BMP: Recent Labs    10/22/22 1221 10/23/22 0647 10/24/22 0425  NA 134* 134* 137  K 4.3 3.5 3.5  CL 100 102 101  CO2 26 23 25   GLUCOSE 324* 213* 152*  BUN 16 13 13   CALCIUM 9.8 8.9 9.3  CREATININE 1.01* 0.72 0.98  GFRNONAA 57* >60 59*    LIVER FUNCTION TESTS: Recent Labs    10/22/22 1221  10/23/22 0647 10/24/22 0425  BILITOT 2.0* 1.2 1.2  AST 74* 67* 43*  ALT 35 48* 39  ALKPHOS 69 65 68  PROT 8.0 7.4 7.5  ALBUMIN 4.2 3.9 4.0    TUMOR MARKERS: No results for input(s): "AFPTM", "CEA", "CA199", "CHROMGRNA" in the last 8760 hours.  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 She was transported to [facility] via [place] and she will return to [facility] post-procedure.  Risks and benefits of liver mass biopsy were discussed with the patient and/or patient's family including, but not limited to bleeding, infection, damage to adjacent structures or  low yield requiring additional tests.  All of the questions were answered and there is agreement to proceed. She has been NPO. IV heparin was stopped yesterday.   Consent signed and in chart.   Thank you for this interesting consult.  I greatly enjoyed meeting TRYSTIN ETTEN and look forward to participating in their care.  A copy of this report was sent to the requesting provider on this date.  Electronically Signed: Alwyn Ren, AGACNP-BC [phone] 10/26/2022, 10:17 AM   I spent a total of 20 Minutes    in face to face in clinical consultation, greater than 50% of which was counseling/coordinating care for liver mass biopsy.

## 2022-10-26 NOTE — Care Management Important Message (Signed)
Important Message  Patient Details  Name: Erin Hoffman MRN: [id number] Date of Birth: [birth year]   Important Message Given:  Yes - Medicare IM     Corey Harold 10/26/2022, 11:27 AM

## 2022-10-26 NOTE — Plan of Care (Signed)
[facility] Tonsillectomy/Adenoidectomy, Postoperative PEDS care plan entered in error.

## 2022-10-26 NOTE — Procedures (Signed)
Pre Procedure Dx: Liver mass Post Procedural Dx: Same  Technically successful US guided biopsy of indeterminate ill defined liver mass.  EBL: Trace No immediate complications.   Katherina Right, MD Pager #: [phone]

## 2022-10-26 NOTE — Plan of Care (Signed)
  Problem: Education: Goal: Knowledge of disease or condition will improve Outcome: Progressing   Problem: Activity: Goal: Ability to tolerate increased activity will improve Outcome: Progressing   Problem: Cardiac: Goal: Ability to achieve and maintain adequate cardiopulmonary perfusion will improve Outcome: Progressing   Problem: Coping: Goal: Ability to adjust to condition or change in health will improve Outcome: Progressing   Problem: Metabolic: Goal: Ability to maintain appropriate glucose levels will improve Outcome: Progressing   Problem: Clinical Measurements: Goal: Cardiovascular complication will be avoided Outcome: Progressing   Problem: Pain Managment: Goal: General experience of comfort will improve Outcome: Progressing

## 2022-10-27 DIAGNOSIS — I4891 Unspecified atrial fibrillation: Secondary | ICD-10-CM | POA: Diagnosis not present

## 2022-10-27 DIAGNOSIS — R160 Hepatomegaly, not elsewhere classified: Secondary | ICD-10-CM | POA: Diagnosis not present

## 2022-10-27 DIAGNOSIS — E1165 Type 2 diabetes mellitus with hyperglycemia: Secondary | ICD-10-CM | POA: Diagnosis not present

## 2022-10-27 DIAGNOSIS — K219 Gastro-esophageal reflux disease without esophagitis: Secondary | ICD-10-CM | POA: Diagnosis not present

## 2022-10-27 LAB — CBC
HCT: 39.7 % (ref 36.0–46.0)
Hemoglobin: 12.6 g/dL (ref 12.0–15.0)
MCH: 28.2 pg (ref 26.0–34.0)
MCHC: 31.7 g/dL (ref 30.0–36.0)
MCV: 88.8 fL (ref 80.0–100.0)
Platelets: 194 10*3/uL (ref 150–400)
RBC: 4.47 MIL/uL (ref 3.87–5.11)
RDW: 14.2 % (ref 11.5–15.5)
WBC: 6.9 10*3/uL (ref 4.0–10.5)
nRBC: 0 % (ref 0.0–0.2)

## 2022-10-27 LAB — GLUCOSE, CAPILLARY
Glucose-Capillary: 126 mg/dL — ABNORMAL HIGH (ref 70–99)
Glucose-Capillary: 258 mg/dL — ABNORMAL HIGH (ref 70–99)

## 2022-10-27 MED ORDER — SENNOSIDES-DOCUSATE SODIUM 8.6-50 MG PO TABS: 1.0000 | ORAL_TABLET | Freq: Two times a day (BID) | ORAL | 0 refills | Status: AC

## 2022-10-27 MED ORDER — BISACODYL 10 MG RE SUPP: 10.0000 mg | Freq: Every day | RECTAL | 0 refills | Status: DC | PRN

## 2022-10-27 NOTE — Progress Notes (Signed)
Patient discharge education given. Patient stated understanding of discharge instructions.

## 2022-10-27 NOTE — Discharge Summary (Signed)
Physician Discharge Summary   Patient: Erin Hoffman MRN: [id number] DOB: [birth year]  Admit date:     10/22/2022  Discharge date: 10/27/22  Discharge Physician: Kathlen Mody   PCP: [name], MD   Recommendations at discharge:  Please follow up with PCP in one week.  Pleas follow up with oncology in one week.  Please follow up with cardiology in 2 weeks.   Discharge Diagnoses: Principal Problem:   Atrial fibrillation (HCC) Active Problems:   Hypothyroidism   DMII (diabetes mellitus, type 2) (HCC)   GERD (gastroesophageal reflux disease)   HLD (hyperlipidemia)   HTN (hypertension)   Liver mass, left lobe   New onset a-fib (HCC)  Resolved Problems:   * No resolved hospital problems. *  Hospital Course:   77 year old F with PMH of DM-2, HTN, HLD, hypothyroidism, GERD and former tobacco smoke presenting with epigastric abdominal pain that she describes as indigestion and burping and right-sided back pain for about 3 days, and admitted with new onset atrial fibrillation with bradycardia and liver lesion.  UA negative for UTI CT angio chest/abdomen/pelvis negative for aortic dissection or aneurysm but heterogeneous mass of the left hepatic lobe and mildly enlarged portal caval node with concern for HCC, and MRI is recommended.  Cardiology consulted for A-fib and recommended starting heparin.  Echocardiogram and MRI ordered.    Assessment and Plan:   Atrial fibrillation (HCC)   Rate better controlled. Rate around 50's/min.  TTE without significant finding. Transitioned to eliquis, discharge on eliquis.      Back Pain:  Indeterminate focus of enhancement in the right T9 posterior elements on the MRI.  Will need MRI of the thoracic , and lumbar spine, but it will be better if she get the PET scan instead.  She agrees.  Pain control.      Poorly marginated Liver mass MRCP ordered, showed  Dominant poorly marginated heterogeneously enhancing 4.4 x 3.4 cm liver  mass centered in the central segment 4 left liver with marked intrahepatic biliary ductal dilatation in the left liver lobe. 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 There are other satellite lesions in the left lobe of the liver.  Multiple pulmonary nodules on the CT of the chest, max size of 7 mm. Will probably need a PET scan for further evaluation.  Oncology consulted for a consult and recommendations given   AFP is negative. CEA and CA 19-9 are elevated.  Has a h/o hysterectomy and cholecystectomy. Pt underwent liver biopsy yesterday and results to be followed up as outpatient.  Pt reports right upper quadrant pain, will discharge her on oral oxycodone.     HTN (hypertension) Optimal.    HLD (hyperlipidemia) Continue with Crestor   GERD (gastroesophageal reflux disease) Continue with Protonix 40 mg daily   DMII (diabetes mellitus, type 2) (HCC) with hyperglycemia, well controlled CBG'S Last A1c is 8.8 Resume home meds.   Hypothyroidism TSH within normal limits Resume synthroid.      Estimated body mass index is 26.73 kg/m as calculated from the following:   Height as of this encounter: 5\' 9"  (1.753 m).   Weight as of this encounter: 82.1 kg.    Consultants: oncology Procedures performed: echo  Disposition: Home Diet recommendation:  Discharge Diet Orders (From admission, onward)     Start     Ordered   10/27/22 0000  Diet - low sodium heart healthy        10/27/22 1017   10/26/22 0000  Diet -  Physician Discharge Summary   Patient: Erin Hoffman MRN: [id number] DOB: [birth year]  Admit date:     10/22/2022  Discharge date: 10/27/22  Discharge Physician: Kathlen Mody   PCP: [name], MD   Recommendations at discharge:  Please follow up with PCP in one week.  Pleas follow up with oncology in one week.  Please follow up with cardiology in 2 weeks.   Discharge Diagnoses: Principal Problem:   Atrial fibrillation (HCC) Active Problems:   Hypothyroidism   DMII (diabetes mellitus, type 2) (HCC)   GERD (gastroesophageal reflux disease)   HLD (hyperlipidemia)   HTN (hypertension)   Liver mass, left lobe   New onset a-fib (HCC)  Resolved Problems:   * No resolved hospital problems. *  Hospital Course:   77 year old F with PMH of DM-2, HTN, HLD, hypothyroidism, GERD and former tobacco smoke presenting with epigastric abdominal pain that she describes as indigestion and burping and right-sided back pain for about 3 days, and admitted with new onset atrial fibrillation with bradycardia and liver lesion.  UA negative for UTI CT angio chest/abdomen/pelvis negative for aortic dissection or aneurysm but heterogeneous mass of the left hepatic lobe and mildly enlarged portal caval node with concern for HCC, and MRI is recommended.  Cardiology consulted for A-fib and recommended starting heparin.  Echocardiogram and MRI ordered.    Assessment and Plan:   Atrial fibrillation (HCC)   Rate better controlled. Rate around 50's/min.  TTE without significant finding. Transitioned to eliquis, discharge on eliquis.      Back Pain:  Indeterminate focus of enhancement in the right T9 posterior elements on the MRI.  Will need MRI of the thoracic , and lumbar spine, but it will be better if she get the PET scan instead.  She agrees.  Pain control.      Poorly marginated Liver mass MRCP ordered, showed  Dominant poorly marginated heterogeneously enhancing 4.4 x 3.4 cm liver  mass centered in the central segment 4 left liver with marked intrahepatic biliary ductal dilatation in the left liver lobe. 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 There are other satellite lesions in the left lobe of the liver.  Multiple pulmonary nodules on the CT of the chest, max size of 7 mm. Will probably need a PET scan for further evaluation.  Oncology consulted for a consult and recommendations given   AFP is negative. CEA and CA 19-9 are elevated.  Has a h/o hysterectomy and cholecystectomy. Pt underwent liver biopsy yesterday and results to be followed up as outpatient.  Pt reports right upper quadrant pain, will discharge her on oral oxycodone.     HTN (hypertension) Optimal.    HLD (hyperlipidemia) Continue with Crestor   GERD (gastroesophageal reflux disease) Continue with Protonix 40 mg daily   DMII (diabetes mellitus, type 2) (HCC) with hyperglycemia, well controlled CBG'S Last A1c is 8.8 Resume home meds.   Hypothyroidism TSH within normal limits Resume synthroid.      Estimated body mass index is 26.73 kg/m as calculated from the following:   Height as of this encounter: 5\' 9"  (1.753 m).   Weight as of this encounter: 82.1 kg.    Consultants: oncology Procedures performed: echo  Disposition: Home Diet recommendation:  Discharge Diet Orders (From admission, onward)     Start     Ordered   10/27/22 0000  Diet - low sodium heart healthy        10/27/22 1017   10/26/22 0000  Diet -  Exam: 10/23/2022 Medical Rec #:  [id number]          Height:       69.0 in Accession #:    [id number]         Weight:       181.0 lb Date of Birth:  March 17, 1945          BSA:          1.980 m Patient Age:    77 years           BP:           195/93 mmHg Patient  Gender: F                  HR:           45 bpm. [facility] Procedure: 2D Echo, Cardiac Doppler and Color Doppler Indications:    Atrial Fibrillation  History:        Patient has no prior history of Echocardiogram examinations.                 Arrythmias:Atrial Fibrillation; Risk Factors:Hypertension,                 Diabetes and Dyslipidemia.  Sonographer:    Mikki Harbor Referring Phys: [id number] ASIA B ZIERLE-GHOSH  Sonographer Comments: Image acquisition challenging due to respiratory motion. IMPRESSIONS  1. Left ventricular ejection fraction, by estimation, is 60 to 65%. The left ventricle has normal function. The left ventricle has no regional wall motion abnormalities. There is mild left ventricular hypertrophy. Left ventricular diastolic parameters are indeterminate.  2. Right ventricular systolic function is normal. The right ventricular size is normal. There is mildly elevated pulmonary artery systolic pressure.  3. Left atrial size was mildly dilated.  4. The mitral valve is abnormal. Mild mitral valve regurgitation. No evidence of mitral stenosis.  5. The tricuspid valve is abnormal. Tricuspid valve regurgitation is mild to moderate.  6. The aortic valve is tricuspid. There is mild calcification of the aortic valve. There is mild thickening of the aortic valve. Aortic valve regurgitation is not visualized. No aortic stenosis is present.  7. The inferior vena cava is normal in size with greater than 50% respiratory variability, suggesting right atrial pressure of 3 mmHg. FINDINGS  Left Ventricle: Left ventricular ejection fraction, by estimation, is 60 to 65%. The left ventricle has normal function. The left ventricle has no regional wall motion abnormalities. The left ventricular internal cavity size was normal in size. There is  mild left ventricular hypertrophy. Left ventricular diastolic parameters are indeterminate. Right Ventricle: The right ventricular size is normal. Right  vetricular wall thickness was not well visualized. Right ventricular systolic function is normal. There is mildly elevated pulmonary artery systolic pressure. The tricuspid regurgitant velocity  is 3.02 m/s, and with an assumed right atrial pressure of 8 mmHg, the estimated right ventricular systolic pressure is 44.5 mmHg. Left Atrium: Left atrial size was mildly dilated. Right Atrium: Right atrial size was normal in size. Pericardium: There is no evidence of pericardial effusion. Mitral Valve: The mitral valve is abnormal. Mild mitral valve regurgitation. No evidence of mitral valve stenosis. MV peak gradient, 3.8 mmHg. The mean mitral valve gradient is 1.0 mmHg. Tricuspid Valve: The tricuspid valve is abnormal. Tricuspid valve regurgitation is mild to moderate. No evidence of tricuspid stenosis. Aortic Valve: The aortic valve is tricuspid. There is mild calcification of the aortic valve. There is mild thickening of the aortic valve.  Exam: 10/23/2022 Medical Rec #:  [id number]          Height:       69.0 in Accession #:    [id number]         Weight:       181.0 lb Date of Birth:  March 17, 1945          BSA:          1.980 m Patient Age:    77 years           BP:           195/93 mmHg Patient  Gender: F                  HR:           45 bpm. [facility] Procedure: 2D Echo, Cardiac Doppler and Color Doppler Indications:    Atrial Fibrillation  History:        Patient has no prior history of Echocardiogram examinations.                 Arrythmias:Atrial Fibrillation; Risk Factors:Hypertension,                 Diabetes and Dyslipidemia.  Sonographer:    Mikki Harbor Referring Phys: [id number] ASIA B ZIERLE-GHOSH  Sonographer Comments: Image acquisition challenging due to respiratory motion. IMPRESSIONS  1. Left ventricular ejection fraction, by estimation, is 60 to 65%. The left ventricle has normal function. The left ventricle has no regional wall motion abnormalities. There is mild left ventricular hypertrophy. Left ventricular diastolic parameters are indeterminate.  2. Right ventricular systolic function is normal. The right ventricular size is normal. There is mildly elevated pulmonary artery systolic pressure.  3. Left atrial size was mildly dilated.  4. The mitral valve is abnormal. Mild mitral valve regurgitation. No evidence of mitral stenosis.  5. The tricuspid valve is abnormal. Tricuspid valve regurgitation is mild to moderate.  6. The aortic valve is tricuspid. There is mild calcification of the aortic valve. There is mild thickening of the aortic valve. Aortic valve regurgitation is not visualized. No aortic stenosis is present.  7. The inferior vena cava is normal in size with greater than 50% respiratory variability, suggesting right atrial pressure of 3 mmHg. FINDINGS  Left Ventricle: Left ventricular ejection fraction, by estimation, is 60 to 65%. The left ventricle has normal function. The left ventricle has no regional wall motion abnormalities. The left ventricular internal cavity size was normal in size. There is  mild left ventricular hypertrophy. Left ventricular diastolic parameters are indeterminate. Right Ventricle: The right ventricular size is normal. Right  vetricular wall thickness was not well visualized. Right ventricular systolic function is normal. There is mildly elevated pulmonary artery systolic pressure. The tricuspid regurgitant velocity  is 3.02 m/s, and with an assumed right atrial pressure of 8 mmHg, the estimated right ventricular systolic pressure is 44.5 mmHg. Left Atrium: Left atrial size was mildly dilated. Right Atrium: Right atrial size was normal in size. Pericardium: There is no evidence of pericardial effusion. Mitral Valve: The mitral valve is abnormal. Mild mitral valve regurgitation. No evidence of mitral valve stenosis. MV peak gradient, 3.8 mmHg. The mean mitral valve gradient is 1.0 mmHg. Tricuspid Valve: The tricuspid valve is abnormal. Tricuspid valve regurgitation is mild to moderate. No evidence of tricuspid stenosis. Aortic Valve: The aortic valve is tricuspid. There is mild calcification of the aortic valve. There is mild thickening of the aortic valve.  Physician Discharge Summary   Patient: Erin Hoffman MRN: [id number] DOB: [birth year]  Admit date:     10/22/2022  Discharge date: 10/27/22  Discharge Physician: Kathlen Mody   PCP: [name], MD   Recommendations at discharge:  Please follow up with PCP in one week.  Pleas follow up with oncology in one week.  Please follow up with cardiology in 2 weeks.   Discharge Diagnoses: Principal Problem:   Atrial fibrillation (HCC) Active Problems:   Hypothyroidism   DMII (diabetes mellitus, type 2) (HCC)   GERD (gastroesophageal reflux disease)   HLD (hyperlipidemia)   HTN (hypertension)   Liver mass, left lobe   New onset a-fib (HCC)  Resolved Problems:   * No resolved hospital problems. *  Hospital Course:   77 year old F with PMH of DM-2, HTN, HLD, hypothyroidism, GERD and former tobacco smoke presenting with epigastric abdominal pain that she describes as indigestion and burping and right-sided back pain for about 3 days, and admitted with new onset atrial fibrillation with bradycardia and liver lesion.  UA negative for UTI CT angio chest/abdomen/pelvis negative for aortic dissection or aneurysm but heterogeneous mass of the left hepatic lobe and mildly enlarged portal caval node with concern for HCC, and MRI is recommended.  Cardiology consulted for A-fib and recommended starting heparin.  Echocardiogram and MRI ordered.    Assessment and Plan:   Atrial fibrillation (HCC)   Rate better controlled. Rate around 50's/min.  TTE without significant finding. Transitioned to eliquis, discharge on eliquis.      Back Pain:  Indeterminate focus of enhancement in the right T9 posterior elements on the MRI.  Will need MRI of the thoracic , and lumbar spine, but it will be better if she get the PET scan instead.  She agrees.  Pain control.      Poorly marginated Liver mass MRCP ordered, showed  Dominant poorly marginated heterogeneously enhancing 4.4 x 3.4 cm liver  mass centered in the central segment 4 left liver with marked intrahepatic biliary ductal dilatation in the left liver lobe. 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 There are other satellite lesions in the left lobe of the liver.  Multiple pulmonary nodules on the CT of the chest, max size of 7 mm. Will probably need a PET scan for further evaluation.  Oncology consulted for a consult and recommendations given   AFP is negative. CEA and CA 19-9 are elevated.  Has a h/o hysterectomy and cholecystectomy. Pt underwent liver biopsy yesterday and results to be followed up as outpatient.  Pt reports right upper quadrant pain, will discharge her on oral oxycodone.     HTN (hypertension) Optimal.    HLD (hyperlipidemia) Continue with Crestor   GERD (gastroesophageal reflux disease) Continue with Protonix 40 mg daily   DMII (diabetes mellitus, type 2) (HCC) with hyperglycemia, well controlled CBG'S Last A1c is 8.8 Resume home meds.   Hypothyroidism TSH within normal limits Resume synthroid.      Estimated body mass index is 26.73 kg/m as calculated from the following:   Height as of this encounter: 5\' 9"  (1.753 m).   Weight as of this encounter: 82.1 kg.    Consultants: oncology Procedures performed: echo  Disposition: Home Diet recommendation:  Discharge Diet Orders (From admission, onward)     Start     Ordered   10/27/22 0000  Diet - low sodium heart healthy        10/27/22 1017   10/26/22 0000  Diet -  Exam: 10/23/2022 Medical Rec #:  [id number]          Height:       69.0 in Accession #:    [id number]         Weight:       181.0 lb Date of Birth:  March 17, 1945          BSA:          1.980 m Patient Age:    77 years           BP:           195/93 mmHg Patient  Gender: F                  HR:           45 bpm. [facility] Procedure: 2D Echo, Cardiac Doppler and Color Doppler Indications:    Atrial Fibrillation  History:        Patient has no prior history of Echocardiogram examinations.                 Arrythmias:Atrial Fibrillation; Risk Factors:Hypertension,                 Diabetes and Dyslipidemia.  Sonographer:    Mikki Harbor Referring Phys: [id number] ASIA B ZIERLE-GHOSH  Sonographer Comments: Image acquisition challenging due to respiratory motion. IMPRESSIONS  1. Left ventricular ejection fraction, by estimation, is 60 to 65%. The left ventricle has normal function. The left ventricle has no regional wall motion abnormalities. There is mild left ventricular hypertrophy. Left ventricular diastolic parameters are indeterminate.  2. Right ventricular systolic function is normal. The right ventricular size is normal. There is mildly elevated pulmonary artery systolic pressure.  3. Left atrial size was mildly dilated.  4. The mitral valve is abnormal. Mild mitral valve regurgitation. No evidence of mitral stenosis.  5. The tricuspid valve is abnormal. Tricuspid valve regurgitation is mild to moderate.  6. The aortic valve is tricuspid. There is mild calcification of the aortic valve. There is mild thickening of the aortic valve. Aortic valve regurgitation is not visualized. No aortic stenosis is present.  7. The inferior vena cava is normal in size with greater than 50% respiratory variability, suggesting right atrial pressure of 3 mmHg. FINDINGS  Left Ventricle: Left ventricular ejection fraction, by estimation, is 60 to 65%. The left ventricle has normal function. The left ventricle has no regional wall motion abnormalities. The left ventricular internal cavity size was normal in size. There is  mild left ventricular hypertrophy. Left ventricular diastolic parameters are indeterminate. Right Ventricle: The right ventricular size is normal. Right  vetricular wall thickness was not well visualized. Right ventricular systolic function is normal. There is mildly elevated pulmonary artery systolic pressure. The tricuspid regurgitant velocity  is 3.02 m/s, and with an assumed right atrial pressure of 8 mmHg, the estimated right ventricular systolic pressure is 44.5 mmHg. Left Atrium: Left atrial size was mildly dilated. Right Atrium: Right atrial size was normal in size. Pericardium: There is no evidence of pericardial effusion. Mitral Valve: The mitral valve is abnormal. Mild mitral valve regurgitation. No evidence of mitral valve stenosis. MV peak gradient, 3.8 mmHg. The mean mitral valve gradient is 1.0 mmHg. Tricuspid Valve: The tricuspid valve is abnormal. Tricuspid valve regurgitation is mild to moderate. No evidence of tricuspid stenosis. Aortic Valve: The aortic valve is tricuspid. There is mild calcification of the aortic valve. There is mild thickening of the aortic valve.  Exam: 10/23/2022 Medical Rec #:  [id number]          Height:       69.0 in Accession #:    [id number]         Weight:       181.0 lb Date of Birth:  March 17, 1945          BSA:          1.980 m Patient Age:    77 years           BP:           195/93 mmHg Patient  Gender: F                  HR:           45 bpm. [facility] Procedure: 2D Echo, Cardiac Doppler and Color Doppler Indications:    Atrial Fibrillation  History:        Patient has no prior history of Echocardiogram examinations.                 Arrythmias:Atrial Fibrillation; Risk Factors:Hypertension,                 Diabetes and Dyslipidemia.  Sonographer:    Mikki Harbor Referring Phys: [id number] ASIA B ZIERLE-GHOSH  Sonographer Comments: Image acquisition challenging due to respiratory motion. IMPRESSIONS  1. Left ventricular ejection fraction, by estimation, is 60 to 65%. The left ventricle has normal function. The left ventricle has no regional wall motion abnormalities. There is mild left ventricular hypertrophy. Left ventricular diastolic parameters are indeterminate.  2. Right ventricular systolic function is normal. The right ventricular size is normal. There is mildly elevated pulmonary artery systolic pressure.  3. Left atrial size was mildly dilated.  4. The mitral valve is abnormal. Mild mitral valve regurgitation. No evidence of mitral stenosis.  5. The tricuspid valve is abnormal. Tricuspid valve regurgitation is mild to moderate.  6. The aortic valve is tricuspid. There is mild calcification of the aortic valve. There is mild thickening of the aortic valve. Aortic valve regurgitation is not visualized. No aortic stenosis is present.  7. The inferior vena cava is normal in size with greater than 50% respiratory variability, suggesting right atrial pressure of 3 mmHg. FINDINGS  Left Ventricle: Left ventricular ejection fraction, by estimation, is 60 to 65%. The left ventricle has normal function. The left ventricle has no regional wall motion abnormalities. The left ventricular internal cavity size was normal in size. There is  mild left ventricular hypertrophy. Left ventricular diastolic parameters are indeterminate. Right Ventricle: The right ventricular size is normal. Right  vetricular wall thickness was not well visualized. Right ventricular systolic function is normal. There is mildly elevated pulmonary artery systolic pressure. The tricuspid regurgitant velocity  is 3.02 m/s, and with an assumed right atrial pressure of 8 mmHg, the estimated right ventricular systolic pressure is 44.5 mmHg. Left Atrium: Left atrial size was mildly dilated. Right Atrium: Right atrial size was normal in size. Pericardium: There is no evidence of pericardial effusion. Mitral Valve: The mitral valve is abnormal. Mild mitral valve regurgitation. No evidence of mitral valve stenosis. MV peak gradient, 3.8 mmHg. The mean mitral valve gradient is 1.0 mmHg. Tricuspid Valve: The tricuspid valve is abnormal. Tricuspid valve regurgitation is mild to moderate. No evidence of tricuspid stenosis. Aortic Valve: The aortic valve is tricuspid. There is mild calcification of the aortic valve. There is mild thickening of the aortic valve.  Physician Discharge Summary   Patient: Erin Hoffman MRN: [id number] DOB: [birth year]  Admit date:     10/22/2022  Discharge date: 10/27/22  Discharge Physician: Kathlen Mody   PCP: [name], MD   Recommendations at discharge:  Please follow up with PCP in one week.  Pleas follow up with oncology in one week.  Please follow up with cardiology in 2 weeks.   Discharge Diagnoses: Principal Problem:   Atrial fibrillation (HCC) Active Problems:   Hypothyroidism   DMII (diabetes mellitus, type 2) (HCC)   GERD (gastroesophageal reflux disease)   HLD (hyperlipidemia)   HTN (hypertension)   Liver mass, left lobe   New onset a-fib (HCC)  Resolved Problems:   * No resolved hospital problems. *  Hospital Course:   77 year old F with PMH of DM-2, HTN, HLD, hypothyroidism, GERD and former tobacco smoke presenting with epigastric abdominal pain that she describes as indigestion and burping and right-sided back pain for about 3 days, and admitted with new onset atrial fibrillation with bradycardia and liver lesion.  UA negative for UTI CT angio chest/abdomen/pelvis negative for aortic dissection or aneurysm but heterogeneous mass of the left hepatic lobe and mildly enlarged portal caval node with concern for HCC, and MRI is recommended.  Cardiology consulted for A-fib and recommended starting heparin.  Echocardiogram and MRI ordered.    Assessment and Plan:   Atrial fibrillation (HCC)   Rate better controlled. Rate around 50's/min.  TTE without significant finding. Transitioned to eliquis, discharge on eliquis.      Back Pain:  Indeterminate focus of enhancement in the right T9 posterior elements on the MRI.  Will need MRI of the thoracic , and lumbar spine, but it will be better if she get the PET scan instead.  She agrees.  Pain control.      Poorly marginated Liver mass MRCP ordered, showed  Dominant poorly marginated heterogeneously enhancing 4.4 x 3.4 cm liver  mass centered in the central segment 4 left liver with marked intrahepatic biliary ductal dilatation in the left liver lobe. 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 There are other satellite lesions in the left lobe of the liver.  Multiple pulmonary nodules on the CT of the chest, max size of 7 mm. Will probably need a PET scan for further evaluation.  Oncology consulted for a consult and recommendations given   AFP is negative. CEA and CA 19-9 are elevated.  Has a h/o hysterectomy and cholecystectomy. Pt underwent liver biopsy yesterday and results to be followed up as outpatient.  Pt reports right upper quadrant pain, will discharge her on oral oxycodone.     HTN (hypertension) Optimal.    HLD (hyperlipidemia) Continue with Crestor   GERD (gastroesophageal reflux disease) Continue with Protonix 40 mg daily   DMII (diabetes mellitus, type 2) (HCC) with hyperglycemia, well controlled CBG'S Last A1c is 8.8 Resume home meds.   Hypothyroidism TSH within normal limits Resume synthroid.      Estimated body mass index is 26.73 kg/m as calculated from the following:   Height as of this encounter: 5\' 9"  (1.753 m).   Weight as of this encounter: 82.1 kg.    Consultants: oncology Procedures performed: echo  Disposition: Home Diet recommendation:  Discharge Diet Orders (From admission, onward)     Start     Ordered   10/27/22 0000  Diet - low sodium heart healthy        10/27/22 1017   10/26/22 0000  Diet -  Exam: 10/23/2022 Medical Rec #:  [id number]          Height:       69.0 in Accession #:    [id number]         Weight:       181.0 lb Date of Birth:  March 17, 1945          BSA:          1.980 m Patient Age:    77 years           BP:           195/93 mmHg Patient  Gender: F                  HR:           45 bpm. [facility]am [facility] Procedure: 2D Echo, Cardiac Doppler and Color Doppler Indications:    Atrial Fibrillation  History:        Patient has no prior history of Echocardiogram examinations.                 Arrythmias:Atrial Fibrillation; Risk Factors:Hypertension,                 Diabetes and Dyslipidemia.  Sonographer:    Mikki Harbor Referring Phys: [id number] ASIA B ZIERLE-GHOSH  Sonographer Comments: Image acquisition challenging due to respiratory motion. IMPRESSIONS  1. Left ventricular ejection fraction, by estimation, is 60 to 65%. The left ventricle has normal function. The left ventricle has no regional wall motion abnormalities. There is mild left ventricular hypertrophy. Left ventricular diastolic parameters are indeterminate.  2. Right ventricular systolic function is normal. The right ventricular size is normal. There is mildly elevated pulmonary artery systolic pressure.  3. Left atrial size was mildly dilated.  4. The mitral valve is abnormal. Mild mitral valve regurgitation. No evidence of mitral stenosis.  5. The tricuspid valve is abnormal. Tricuspid valve regurgitation is mild to moderate.  6. The aortic valve is tricuspid. There is mild calcification of the aortic valve. There is mild thickening of the aortic valve. Aortic valve regurgitation is not visualized. No aortic stenosis is present.  7. The inferior vena cava is normal in size with greater than 50% respiratory variability, suggesting right atrial pressure of 3 mmHg. FINDINGS  Left Ventricle: Left ventricular ejection fraction, by estimation, is 60 to 65%. The left ventricle has normal function. The left ventricle has no regional wall motion abnormalities. The left ventricular internal cavity size was normal in size. There is  mild left ventricular hypertrophy. Left ventricular diastolic parameters are indeterminate. Right Ventricle: The right ventricular size is normal. Right  vetricular wall thickness was not well visualized. Right ventricular systolic function is normal. There is mildly elevated pulmonary artery systolic pressure. The tricuspid regurgitant velocity  is 3.02 m/s, and with an assumed right atrial pressure of 8 mmHg, the estimated right ventricular systolic pressure is 44.5 mmHg. Left Atrium: Left atrial size was mildly dilated. Right Atrium: Right atrial size was normal in size. Pericardium: There is no evidence of pericardial effusion. Mitral Valve: The mitral valve is abnormal. Mild mitral valve regurgitation. No evidence of mitral valve stenosis. MV peak gradient, 3.8 mmHg. The mean mitral valve gradient is 1.0 mmHg. Tricuspid Valve: The tricuspid valve is abnormal. Tricuspid valve regurgitation is mild to moderate. No evidence of tricuspid stenosis. Aortic Valve: The aortic valve is tricuspid. There is mild calcification of the aortic valve. There is mild thickening of the aortic valve.

## 2022-10-31 DIAGNOSIS — G894 Chronic pain syndrome: Secondary | ICD-10-CM | POA: Diagnosis not present

## 2022-10-31 DIAGNOSIS — M150 Primary generalized (osteo)arthritis: Secondary | ICD-10-CM | POA: Diagnosis not present

## 2022-10-31 DIAGNOSIS — E663 Overweight: Secondary | ICD-10-CM | POA: Diagnosis not present

## 2022-10-31 DIAGNOSIS — C221 Intrahepatic bile duct carcinoma: Secondary | ICD-10-CM | POA: Diagnosis not present

## 2022-10-31 DIAGNOSIS — I10 Essential (primary) hypertension: Secondary | ICD-10-CM | POA: Diagnosis not present

## 2022-10-31 DIAGNOSIS — E1165 Type 2 diabetes mellitus with hyperglycemia: Secondary | ICD-10-CM | POA: Diagnosis not present

## 2022-10-31 DIAGNOSIS — Z6827 Body mass index (BMI) 27.0-27.9, adult: Secondary | ICD-10-CM | POA: Diagnosis not present

## 2022-11-06 ENCOUNTER — Other Ambulatory Visit: Payer: Self-pay

## 2022-11-06 ENCOUNTER — Other Ambulatory Visit: Payer: Self-pay | Admitting: *Deleted

## 2022-11-06 DIAGNOSIS — R160 Hepatomegaly, not elsewhere classified: Secondary | ICD-10-CM

## 2022-11-06 NOTE — Progress Notes (Signed)
Per Dr. Ellin Saba, order placed for PET skull base to thigh to evaluate eterogeneous mass in the left hepatic lobe and mildly enlarged portacaval node.

## 2022-11-06 NOTE — Progress Notes (Signed)
PET scan order placed per Dr. Katragadda. 

## 2022-11-08 ENCOUNTER — Ambulatory Visit (HOSPITAL_COMMUNITY)
Admission: RE | Admit: 2022-11-08 | Discharge: 2022-11-08 | Disposition: A | Discharge status: Home / Self Care | Payer: HMO | Source: Ambulatory Visit | Attending: Hematology | Admitting: Hematology

## 2022-11-08 DIAGNOSIS — C782 Secondary malignant neoplasm of pleura: Secondary | ICD-10-CM | POA: Diagnosis not present

## 2022-11-08 DIAGNOSIS — R160 Hepatomegaly, not elsewhere classified: Secondary | ICD-10-CM | POA: Insufficient documentation

## 2022-11-08 DIAGNOSIS — I10 Essential (primary) hypertension: Secondary | ICD-10-CM | POA: Diagnosis not present

## 2022-11-08 DIAGNOSIS — E782 Mixed hyperlipidemia: Secondary | ICD-10-CM | POA: Diagnosis not present

## 2022-11-08 DIAGNOSIS — E1165 Type 2 diabetes mellitus with hyperglycemia: Secondary | ICD-10-CM | POA: Diagnosis not present

## 2022-11-08 DIAGNOSIS — C7951 Secondary malignant neoplasm of bone: Secondary | ICD-10-CM | POA: Diagnosis not present

## 2022-11-08 DIAGNOSIS — C221 Intrahepatic bile duct carcinoma: Secondary | ICD-10-CM | POA: Diagnosis not present

## 2022-11-08 MED ORDER — FLUDEOXYGLUCOSE F - 18 (FDG) INJECTION
8.7700 | Freq: Once | INTRAVENOUS | Status: AC | PRN
Start: 2022-11-08 — End: 2022-11-08
  Administered 2022-11-08: 8.77 via INTRAVENOUS

## 2022-11-13 ENCOUNTER — Inpatient Hospital Stay: Payer: HMO | Attending: Hematology | Admitting: Hematology

## 2022-11-13 ENCOUNTER — Encounter: Payer: Self-pay | Admitting: Hematology

## 2022-11-13 VITALS — BP 149/75 | HR 65 | Temp 97.8°F | Resp 17 | Wt 181.6 lb

## 2022-11-13 DIAGNOSIS — C7801 Secondary malignant neoplasm of right lung: Secondary | ICD-10-CM | POA: Diagnosis not present

## 2022-11-13 DIAGNOSIS — Z79630 Long term (current) use of alkylating agent: Secondary | ICD-10-CM | POA: Diagnosis not present

## 2022-11-13 DIAGNOSIS — C221 Intrahepatic bile duct carcinoma: Secondary | ICD-10-CM | POA: Insufficient documentation

## 2022-11-13 DIAGNOSIS — I4891 Unspecified atrial fibrillation: Secondary | ICD-10-CM | POA: Insufficient documentation

## 2022-11-13 DIAGNOSIS — Z90710 Acquired absence of both cervix and uterus: Secondary | ICD-10-CM | POA: Insufficient documentation

## 2022-11-13 DIAGNOSIS — E785 Hyperlipidemia, unspecified: Secondary | ICD-10-CM | POA: Insufficient documentation

## 2022-11-13 DIAGNOSIS — I700 Atherosclerosis of aorta: Secondary | ICD-10-CM | POA: Insufficient documentation

## 2022-11-13 DIAGNOSIS — Z79631 Long term (current) use of antimetabolite agent: Secondary | ICD-10-CM | POA: Diagnosis not present

## 2022-11-13 DIAGNOSIS — Z8261 Family history of arthritis: Secondary | ICD-10-CM | POA: Insufficient documentation

## 2022-11-13 DIAGNOSIS — Z8379 Family history of other diseases of the digestive system: Secondary | ICD-10-CM | POA: Insufficient documentation

## 2022-11-13 DIAGNOSIS — C7951 Secondary malignant neoplasm of bone: Secondary | ICD-10-CM | POA: Insufficient documentation

## 2022-11-13 DIAGNOSIS — Z87891 Personal history of nicotine dependence: Secondary | ICD-10-CM | POA: Insufficient documentation

## 2022-11-13 DIAGNOSIS — R599 Enlarged lymph nodes, unspecified: Secondary | ICD-10-CM | POA: Insufficient documentation

## 2022-11-13 DIAGNOSIS — M79661 Pain in right lower leg: Secondary | ICD-10-CM | POA: Diagnosis not present

## 2022-11-13 DIAGNOSIS — I871 Compression of vein: Secondary | ICD-10-CM | POA: Diagnosis not present

## 2022-11-13 DIAGNOSIS — I083 Combined rheumatic disorders of mitral, aortic and tricuspid valves: Secondary | ICD-10-CM | POA: Insufficient documentation

## 2022-11-13 DIAGNOSIS — K30 Functional dyspepsia: Secondary | ICD-10-CM | POA: Insufficient documentation

## 2022-11-13 DIAGNOSIS — M549 Dorsalgia, unspecified: Secondary | ICD-10-CM | POA: Diagnosis not present

## 2022-11-13 DIAGNOSIS — Z79899 Other long term (current) drug therapy: Secondary | ICD-10-CM | POA: Insufficient documentation

## 2022-11-13 DIAGNOSIS — G893 Neoplasm related pain (acute) (chronic): Secondary | ICD-10-CM | POA: Insufficient documentation

## 2022-11-13 DIAGNOSIS — Z833 Family history of diabetes mellitus: Secondary | ICD-10-CM | POA: Insufficient documentation

## 2022-11-13 DIAGNOSIS — Z9049 Acquired absence of other specified parts of digestive tract: Secondary | ICD-10-CM | POA: Insufficient documentation

## 2022-11-13 DIAGNOSIS — Z79620 Long term (current) use of immunosuppressive biologic: Secondary | ICD-10-CM | POA: Insufficient documentation

## 2022-11-13 DIAGNOSIS — Z5112 Encounter for antineoplastic immunotherapy: Secondary | ICD-10-CM | POA: Insufficient documentation

## 2022-11-13 DIAGNOSIS — J439 Emphysema, unspecified: Secondary | ICD-10-CM | POA: Diagnosis not present

## 2022-11-13 DIAGNOSIS — Z807 Family history of other malignant neoplasms of lymphoid, hematopoietic and related tissues: Secondary | ICD-10-CM | POA: Insufficient documentation

## 2022-11-13 DIAGNOSIS — R0789 Other chest pain: Secondary | ICD-10-CM | POA: Diagnosis not present

## 2022-11-13 DIAGNOSIS — Z808 Family history of malignant neoplasm of other organs or systems: Secondary | ICD-10-CM | POA: Insufficient documentation

## 2022-11-13 DIAGNOSIS — Z7901 Long term (current) use of anticoagulants: Secondary | ICD-10-CM | POA: Diagnosis not present

## 2022-11-13 DIAGNOSIS — Z8249 Family history of ischemic heart disease and other diseases of the circulatory system: Secondary | ICD-10-CM | POA: Insufficient documentation

## 2022-11-13 DIAGNOSIS — Z5111 Encounter for antineoplastic chemotherapy: Secondary | ICD-10-CM | POA: Diagnosis not present

## 2022-11-13 DIAGNOSIS — I10 Essential (primary) hypertension: Secondary | ICD-10-CM | POA: Diagnosis not present

## 2022-11-13 MED ORDER — OXYCODONE HCL 5 MG PO TABS: 5.0000 mg | ORAL_TABLET | Freq: Four times a day (QID) | ORAL | 0 refills | Status: DC | PRN

## 2022-11-13 NOTE — Progress Notes (Signed)
[facility] [address], [place] [address]   Clinic Day:  11/13/2022  Referring physician: [name], MD  Patient Care Team: [name], MD as PCP - General (Internal Medicine)   ASSESSMENT & PLAN:   Assessment:  1.  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 - CT CAP (10/22/2022): Heterogeneous mass in the left hepatic lobe, mildly enlarged portacaval node and small lymph nodes in the anterior cardiophrenic fat.  Scattered lung nodules, largest 7 mm. - MRI/MRCP (10/24/2022): Dominant poorly marginated heterogeneous enhancing 4 x 4 x 3.4 cm liver mass centered in the central segment 4 of the left liver with marked intrahepatic biliary ductal dilatation in the left lower lobe.  Numerous similar enhancing satellite liver masses scattered predominantly in the left lower lobe with some involvement of segment 8 right liver and caudate lobe compatible with widespread liver metastasis.  Mildly enlarged porta hepatic lymph node. - Tumor markers: CEA: 39, CA 19-9: 381, AFP: 3.1. - Liver mass biopsy (10/26/2022): Invasive moderately differentiated adenocarcinoma, strongly positive for CK7, focal positivity for CDX2, negative for CK20, negative for hepatic marker arginase.  Site of histomorphology and IHC pattern compatible with pancreaticobiliary origin including cholangiocarcinoma. - PET scan (11/08/2022): Wedge-shaped hypermetabolic mass in the central left hepatic lobe involving central and left hepatic lobe of the liver.  Pleural-based metastasis in the right upper lobe invading the right fourth rib.  Hypermetabolic skeletal metastasis to the T9 vertebral body.  Focal hypermetabolic activity in the muscle posterior to the left humeral head concerning for solitary muscle metastasis. - NGS: - Gemcitabine, cisplatin and durvalumab started  on  2.  Social/family history: - She lives by herself at home and independent of ADLs and IADLs.  Smoked 1 pack/day for 30 years and quit in the late 1990s. - Brother had multiple myeloma.  Mother had bone cancer and father had brain cancer.  Plan:  1.  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 - We reviewed PET scan images with the patient and her 2 sons. - Discussed prognosis of metastatic cholangiocarcinoma in detail. - Discussed active treatment with chemoimmunotherapy versus best supportive care. - Patient opted for active therapy.  She has good performance status and no hyperbilirubinemia.  We discussed gemcitabine, cisplatin and durvalumab (TOPAZ-1) trial which showed improvement in overall survival compared to chemotherapy alone.  Instead of day 1, 8 every 21 days, I have recommended day 1 and day 15 every 28 days for better tolerability.  We will plan for chemotherapy for up to 8 cycles followed by durvalumab maintenance every 4 weeks until disease progression or unacceptable toxicity. - We discussed side effects in detail. - We will schedule for port placement. - I have also recommended NGS testing on the biopsy. - She will likely start cycle 1 day 1 after port placement.  I will see her back on day 15.  2.  Right posterior chest wall pain: - She has right upper quadrant pain with radiating to the back, worse at night.  Most of the time it is achy and sometimes stabbing. - She is taking oxycodone 5 mg twice daily as needed which is helping.  She is taking Tylenol in between every 4 hours. - I have recommended that she may increase oxycodone 5 mg to every 6 hours as needed.  If the pain does not improve after  starting systemic therapy, will consider radiation therapy.   Orders Placed This Encounter  Procedures   IR IMAGING GUIDED PORT INSERTION    Standing Status:   Future    Standing Expiration Date:   11/13/2023    Order  Specific Question:   Reason for Exam (SYMPTOM  OR DIAGNOSIS REQUIRED)    Answer:   chemotherapy administration    Order Specific Question:   Preferred Imaging Location?    Answer:   [facility]      I,Helena R Teague,acting as a scribe for Doreatha Massed, MD.,have documented all relevant documentation on the behalf of Doreatha Massed, MD,as directed by  Doreatha Massed, MD while in the presence of Doreatha Massed, MD.   I, Doreatha Massed MD, have reviewed the above documentation for accuracy and completeness, and I agree with the above.   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   Cancer Staging  Cholangiocarcinoma [facility]) Staging form: Intrahepatic Bile Duct, AJCC 8th Edition - Clinical stage from 11/13/2022: Stage IV (cT2, cN0, cM1) - Signed by Doreatha Massed, MD on 11/13/2022    Prior Therapy: None  Current Therapy: Gemcitabine, cisplatin and durvalumab   HISTORY OF PRESENT ILLNESS:   Oncology History  Cholangiocarcinoma (HCC)  11/13/2022 Initial Diagnosis   Cholangiocarcinoma (HCC)   11/13/2022 Cancer Staging   Staging form: Intrahepatic Bile Duct, AJCC 8th Edition - Clinical stage from 11/13/2022: Stage IV (cT2, cN0, cM1) - Signed by Doreatha Massed, MD on 11/13/2022 Histopathologic type: Cholangiocarcinoma Stage prefix: Initial diagnosis Histologic grade (G): G2 Histologic grading system: 3 grade system       Erin Hoffman is a 77 y.o. female presenting to clinic today for evaluation of hepatocellular carcinoma at the request of Dr. Blake Divine.  Patient was admitted to the hospital on 10/23/22 for epigastric and back pain. CT angio C/A/P found: suspicion of a defined heterogenous mass in left hepatic lobe, concerning for HCC; mildly enlarged portal caval node and small lymph nodes in the anterior cardiophrenic fat; emphysema; and  scattered pulmonary nodules, largest is a 7 mm right apical ground-glass nodule. MRI of the abdomen found: dominant poorly marginated heterogeneously enhancing 4.4 x 3.4 cm liver mass centered in the central segment 4 left liver with marked intrahepatic biliary ductal dilatation in the left liver lobe, most compatible with intrahepatic cholangiocarcinoma; numerous similar enhancing satellite liver masses scattered predominantly in the left liver lobe with some involvement of the segment 8 right liver and caudate lobe, compatible with widespread liver metastases; mildly enlarged porta hepatis lymph node, indeterminate and potentially metastatic; and indeterminate focus of enhancement in the right T9 posterior elements. She was also started on eliquis for newly diagnosed A-fib. She underwent liver biopsy, with pathology revealing: invasive moderately differentiated adenocarcinoma.   She had a PET on 11/08/22 that found: wedge-shaped hypermetabolic mass in the central LEFT hepatic lobe consistent with biopsy-proven cholangiocarcinoma, carcinoma involves the central and LEFT LEFT hepatic lobe of the liver; pleural base metastasis in the RIGHT upper lobe invades the RIGHT fourth rib; hypermetabolic skeletal metastasis to the T9 vertebral body; and focal hypermetabolic activity in the muscle posterior to the LEFT humeral head is concerning for solitary muscle metastasis. Of note, she has a history of hysterectomy and cholecystectomy.   Today, she states that she is doing well overall. Her appetite level is at 70%. Her energy level is at 40%. She is accompanied by her 2 sons.  She notes a normal appetite and has not had significant weight loss. She reports pain in the RUQ pain radiating to the back. Pain is usually achy, intermittently stabbing and sharp. Pain is worsened at night and improved in the morning. She denies pain in the left shoulder. She takes 2 Oxycodone 5 mg pills a day, once in the morning and once in  the evening, for pain that improves symptoms for 2-4 hours. She takes Tylenol during the day, when Oxycodone wears off. She denies any autoimmune disorders.   PAST MEDICAL HISTORY:   Past Medical History: Past Medical History:  Diagnosis Date   Anemia    Anxiety    Diabetes mellitus    H/O hiatal hernia    Hypertension    Hypothyroidism     Surgical History: Past Surgical History:  Procedure Laterality Date   ABDOMINAL HYSTERECTOMY     CATARACT EXTRACTION W/PHACO Left 06/16/2013   Procedure: CATARACT EXTRACTION PHACO AND INTRAOCULAR LENS PLACEMENT (IOC);  Surgeon: [name] T. Nile Riggs, MD;  Location: [facility] ORS;  Service: Ophthalmology;  Laterality: Left;  CDE:4.65   COLONOSCOPY N/A 10/01/2014   Procedure: COLONOSCOPY;  Surgeon: Malissa Hippo, MD;  Location: [facility] SUITE;  Service: Endoscopy;  Laterality: N/A;  1125   TONSILLECTOMY     TUBAL LIGATION      Social History: Social History   Socioeconomic History   Marital status: Divorced    Spouse name: Not on file   Number of children: Not on file   Years of education: Not on file   Highest education level: Not on file  Occupational History   Not on file  Tobacco Use   Smoking status: Former    Current packs/day: 0.00    Average packs/day: 1 pack/day for 30.0 years (30.0 ttl pk-yrs)    Types: Cigarettes    Start date: 06/11/1958    Quit date: 06/10/1988    Years since quitting: 34.4   Smokeless tobacco: Never  Substance and Sexual Activity   Alcohol use: No   Drug use: No   Sexual activity: Yes    Birth control/protection: Surgical  Other Topics Concern   Not on file  Social History Narrative   Not on file   Social Determinants of Health   Financial Resource Strain: Not on file  Food Insecurity: No Food Insecurity (10/23/2022)   Hunger Vital Sign    Worried About Running Out of Food in the Last Year: Never true    Ran Out of Food in the Last Year: Never true  Transportation Needs: No Transportation Needs (10/23/2022)    PRAPARE - Administrator, Civil Service (Medical): No    Lack of Transportation (Non-Medical): No  Physical Activity: Not on file  Stress: Not on file  Social Connections: Not on file  Intimate Partner Violence: Not At Risk (10/23/2022)   Humiliation, Afraid, Rape, and Kick questionnaire    Fear of Current or Ex-Partner: No    Emotionally Abused: No    Physically Abused: No    Sexually Abused: No    Family History: Family History  Problem Relation Age of Onset   Brain cancer Father    Crohn's disease Sister    Coronary artery disease Sister    Multiple myeloma Brother    Diabetes Brother    Hypertension Brother    Rheum arthritis Mother     Current Medications:  Current Outpatient Medications:    acetaminophen (TYLENOL) 500 MG tablet, Take 500 mg by  mouth every 6 (six) hours as needed for moderate pain (pain score 4-6)., Disp: , Rfl:    ALPRAZolam (XANAX) 0.5 MG tablet, Take 0.5 mg by mouth at bedtime as needed for anxiety., Disp: , Rfl:    apixaban (ELIQUIS) 5 MG TABS tablet, Take 1 tablet (5 mg total) by mouth 2 (two) times daily., Disp: 60 tablet, Rfl: 3   bisacodyl (DULCOLAX) 10 MG suppository, Place 1 suppository (10 mg total) rectally daily as needed for moderate constipation., Disp: 12 suppository, Rfl: 0   cyanocobalamin (,VITAMIN B-12,) 1000 MCG/ML injection, Inject 1,000 mcg into the muscle every 30 (thirty) days., Disp: , Rfl:    enalapril (VASOTEC) 5 MG tablet, Take 5 mg by mouth daily., Disp: , Rfl:    famotidine (PEPCID) 20 MG tablet, Take 20 mg by mouth 2 (two) times daily as needed., Disp: , Rfl:    fenofibrate micronized (LOFIBRA) 134 MG capsule, Take 134 mg by mouth daily before breakfast., Disp: , Rfl:    fexofenadine (ALLEGRA) 180 MG tablet, Take 180 mg by mouth daily as needed for allergies or rhinitis., Disp: , Rfl:    glimepiride (AMARYL) 4 MG tablet, Take 4 mg by mouth 2 (two) times daily., Disp: , Rfl:    insulin glargine (LANTUS) 100  UNIT/ML injection, Inject 40 Units into the skin at bedtime as needed (high blood sugar)., Disp: , Rfl:    levothyroxine (SYNTHROID, LEVOTHROID) 137 MCG tablet, Take 137 mcg by mouth daily before breakfast., Disp: , Rfl:    metFORMIN (GLUCOPHAGE) 500 MG tablet, Take 500 mg by mouth 2 (two) times daily with a meal., Disp: , Rfl:    Omega-3 Fatty Acids (FISH OIL) 1200 MG CAPS, Take 1 capsule by mouth daily. , Disp: , Rfl:    ONETOUCH VERIO test strip, 3 (three) times daily., Disp: , Rfl:    pantoprazole (PROTONIX) 40 MG tablet, Take 1 tablet (40 mg total) by mouth daily., Disp: 30 tablet, Rfl: 0   polyethylene glycol (MIRALAX / GLYCOLAX) 17 g packet, Take 17 g by mouth daily., Disp: 14 each, Rfl: 0   rosuvastatin (CRESTOR) 10 MG tablet, Take 10 mg by mouth once a week., Disp: , Rfl:    Allergies: Allergies  Allergen Reactions   Tape Itching and Rash    REVIEW OF SYSTEMS:   Review of Systems  Constitutional:  Negative for chills, fatigue and fever.  HENT:   Negative for lump/mass, mouth sores, nosebleeds, sore throat and trouble swallowing.   Eyes:  Negative for eye problems.  Respiratory:  Positive for cough. Negative for shortness of breath.   Cardiovascular:  Negative for chest pain, leg swelling and palpitations.  Gastrointestinal:  Positive for abdominal pain (RUQ, 6/10 severity). Negative for constipation, diarrhea, nausea and vomiting.  Genitourinary:  Negative for bladder incontinence, difficulty urinating, dysuria, frequency, hematuria and nocturia.   Musculoskeletal:  Positive for back pain (6/10 severity). Negative for arthralgias, flank pain, myalgias and neck pain.  Skin:  Negative for itching and rash.  Neurological:  Negative for dizziness, headaches and numbness.  Hematological:  Does not bruise/bleed easily.  Psychiatric/Behavioral:  Positive for sleep disturbance. Negative for depression and suicidal ideas. The patient is not nervous/anxious.   All other systems reviewed  and are negative.    VITALS:   Blood pressure (!) 149/75, pulse 65, temperature 97.8 F (36.6 C), temperature source Oral, resp. rate 17, weight 181 lb 9.6 oz (82.4 kg), SpO2 95%.  Wt Readings from Last 3 Encounters:  11/13/22  181 lb 9.6 oz (82.4 kg)  10/23/22 181 lb (82.1 kg)  10/01/14 202 lb (91.6 kg)    Body mass index is 26.82 kg/m.  Performance status (ECOG): 1 - Symptomatic but completely ambulatory  PHYSICAL EXAM:   Physical Exam Vitals and nursing note reviewed. Exam conducted with a chaperone present.  Constitutional:      Appearance: Normal appearance.  Cardiovascular:     Rate and Rhythm: Normal rate and regular rhythm.     Pulses: Normal pulses.     Heart sounds: Normal heart sounds.  Pulmonary:     Effort: Pulmonary effort is normal.     Breath sounds: Normal breath sounds.  Abdominal:     Palpations: Abdomen is soft. There is no hepatomegaly, splenomegaly or mass.     Tenderness: There is no abdominal tenderness.  Musculoskeletal:     Right lower leg: No edema.     Left lower leg: No edema.  Lymphadenopathy:     Cervical: No cervical adenopathy.     Right cervical: No superficial, deep or posterior cervical adenopathy.    Left cervical: No superficial, deep or posterior cervical adenopathy.     Upper Body:     Right upper body: No supraclavicular or axillary adenopathy.     Left upper body: No supraclavicular or axillary adenopathy.  Neurological:     General: No focal deficit present.     Mental Status: She is alert and oriented to person, place, and time.  Psychiatric:        Mood and Affect: Mood normal.        Behavior: Behavior normal.     LABS:      Latest Ref Rng & Units 10/27/2022    5:08 AM 10/26/2022    5:05 AM 10/25/2022    5:12 AM  CBC  WBC 4.0 - 10.5 K/uL 6.9  5.8  7.6   Hemoglobin 12.0 - 15.0 g/dL 16.1  09.6  04.5   Hematocrit 36.0 - 46.0 % 39.7  39.1  40.2   Platelets 150 - 400 K/uL 194  190  203       Latest Ref Rng &  Units 10/24/2022    4:25 AM 10/23/2022    6:47 AM 10/22/2022   12:21 PM  CMP  Glucose 70 - 99 mg/dL 409  811  914   BUN 8 - 23 mg/dL 13  13  16    Creatinine 0.44 - 1.00 mg/dL 7.82  9.56  2.13   Sodium 135 - 145 mmol/L 137  134  134   Potassium 3.5 - 5.1 mmol/L 3.5  3.5  4.3   Chloride 98 - 111 mmol/L 101  102  100   CO2 22 - 32 mmol/L 25  23  26    Calcium 8.9 - 10.3 mg/dL 9.3  8.9  9.8   Total Protein 6.5 - 8.1 g/dL 7.5  7.4  8.0   Total Bilirubin 0.3 - 1.2 mg/dL 1.2  1.2  2.0   Alkaline Phos 38 - 126 U/L 68  65  69   AST 15 - 41 U/L 43  67  74   ALT 0 - 44 U/L 39  48  35      Lab Results  Component Value Date   CEA1 39.0 (H) 10/23/2022   /  CEA  Date Value Ref Range Status  10/23/2022 39.0 (H) 0.0 - 4.7 ng/mL Final    Comment:    (NOTE)  Nonsmokers          <3.9                             Smokers             <5.6 [employer] Electrochemiluminescence Immunoassay (ECLIA) Values obtained with different assay methods or kits cannot be used interchangeably.  Results cannot be interpreted as absolute evidence of the presence or absence of malignant disease. Performed At: [facility] [address] [place], [place] [id number] Jolene Schimke MD [id number]    No results found for: "PSA1" Lab Results  Component Value Date   CAN199 381 (H) 10/23/2022   No results found for: "CAN125"  No results found for: "TOTALPROTELP", "ALBUMINELP", "A1GS", "A2GS", "BETS", "BETA2SER", "GAMS", "MSPIKE", "SPEI" No results found for: "TIBC", "FERRITIN", "IRONPCTSAT" No results found for: "LDH"   STUDIES:   NM PET Image Initial (PI) Skull Base To Thigh  Result Date: 11/13/2022 CLINICAL DATA:  Initial encounter. treatment strategy for cholangiocarcinoma. EXAM: NUCLEAR MEDICINE PET SKULL BASE TO THIGH TECHNIQUE: 8.8 mCi F-18 FDG was injected intravenously. Full-ring PET imaging was performed from the skull base to thigh after the radiotracer.  CT data was obtained and used for attenuation correction and anatomic localization. Fasting blood glucose: 200 mg/dl COMPARISON:  MRI 30/86/5784 FINDINGS: Mediastinal blood pool activity: SUV max 2.3 Liver activity: SUV max NA NECK: No hypermetabolic lymph nodes in the neck. Incidental CT findings: None. CHEST: Pleural base mass in the medial RIGHT upper lobe measures 2.5 x 1.6 cm and has intense metabolic activity with SUV max equal 9.3. This mass erodes into the adjacent rib RIGHT sixth rib. Three 66 is (image 50/series 202) if. A second the thoracic lesion involves the the RIGHT transverse process of the T9 vertebral body with SUV max equal 8.7. Focal uptake in the musculature deep to the LEFT humeral head with SUV max equal 4.4 on image 43. No CT correlation. Incidental CT findings: None. ABDOMEN/PELVIS: Patchy hypermetabolic activity associated with the wedge shaped hypodense lesion in the central LEFT hepatic lobe with SUV max equal 5.5 on image 84. There is hypermetabolic activity on either side of the falciform ligament (image 87). Additionally there is intense metabolic activity in the medial aspect of the LEFT lateral hepatic lobe SUV max equal 5.8 on image 89. No abnormal activity within the RIGHT hepatic lobe. No hypermetabolic upper abdominal adenopathy Incidental CT findings: Postcholecystectomy SKELETON: Thoracic skeletal metastasis described in the chest section. Incidental CT findings: None. IMPRESSION: 1. Wedge-shaped hypermetabolic mass in the central LEFT hepatic lobe consistent with biopsy-proven cholangiocarcinoma. Carcinoma involves the central and LEFT LEFT hepatic lobe of the liver. 2. Pleural base metastasis in the RIGHT upper lobe invades the RIGHT fourth rib. 3. Hypermetabolic skeletal metastasis to the T9 vertebral body. 4. Focal hypermetabolic activity in the muscle posterior to the LEFT humeral head is concerning for solitary muscle metastasis. Electronically Signed   By: Genevive Bi M.D.   On: 11/13/2022 10:50   US BIOPSY (LIVER)  Result Date: 10/26/2022 INDICATION: No known primary, now with infiltrative mass involving the majority of the medial segment of the left lobe of the liver worrisome cholangiocarcinoma. Please perform ultrasound-guided biopsy for tissue diagnostic purposes. EXAM: ULTRASOUND GUIDED LIVER LESION BIOPSY COMPARISON:  Abdominal MRI-10/24/2022; CT of the chest, abdomen and pelvis-10/22/2022 MEDICATIONS: None ANESTHESIA/SEDATION: Moderate (conscious) sedation was employed during this procedure as administered by the Interventional Radiology RN. A total  of Versed 0.5 mg and Fentanyl 25 mcg was administered intravenously. Moderate Sedation Time: 11 minutes. The patient's level of consciousness and vital signs were monitored continuously by radiology nursing throughout the procedure under my direct supervision. COMPLICATIONS: None immediate. PROCEDURE: Informed written consent was obtained from the patient after a discussion of the risks, benefits and alternatives to treatment. The patient understands and consents the procedure. A timeout was performed prior to the initiation of the procedure. Ultrasound scanning was performed of the right upper abdominal quadrant demonstrates an ill-defined hypoechoic mass involving the medial segment of the left lobe of the liver (image 4) corresponding with the ill-defined lesion seen on preceding abdominal MRI and CT. The procedure was planned. The right upper abdominal quadrant was prepped and draped in the usual sterile fashion. The overlying soft tissues were anesthetized with 1% lidocaine with epinephrine. A 17 gauge, 6.8 cm co-axial needle was advanced into a peripheral aspect of the lesion. This was followed by 6 core biopsies with an 18 gauge core device under direct ultrasound guidance. The coaxial needle tract was embolized with a small amount of Gel-Foam slurry and superficial hemostasis was obtained with manual  compression. Post procedural scanning was negative for definitive area of hemorrhage or additional complication. A dressing was placed. The patient tolerated the procedure well without immediate post procedural complication. IMPRESSION: Technically successful ultrasound guided core needle biopsy of indeterminate infiltrative lesion involving the majority of the medial segment of the left lobe of the liver. Electronically Signed   By: Simonne Come M.D.   On: 10/26/2022 14:39   US Venous Img Lower Bilateral (DVT)  Result Date: 10/25/2022 CLINICAL DATA:  Bilateral lower extremity edema and calf pain. EXAM: BILATERAL LOWER EXTREMITY VENOUS DOPPLER ULTRASOUND TECHNIQUE: Gray-scale sonography with graded compression, as well as color Doppler and duplex ultrasound were performed to evaluate the lower extremity deep venous systems from the level of the common femoral vein and including the common femoral, femoral, profunda femoral, popliteal and calf veins including the posterior tibial, peroneal and gastrocnemius veins when visible. The superficial great saphenous vein was also interrogated. Spectral Doppler was utilized to evaluate flow at rest and with distal augmentation maneuvers in the common femoral, femoral and popliteal veins. COMPARISON:  None Available. FINDINGS: RIGHT LOWER EXTREMITY Common Femoral Vein: No evidence of thrombus. Normal compressibility, respiratory phasicity and response to augmentation. Saphenofemoral Junction: No evidence of thrombus. Normal compressibility and flow on color Doppler imaging. Profunda Femoral Vein: No evidence of thrombus. Normal compressibility and flow on color Doppler imaging. Femoral Vein: No evidence of thrombus. Normal compressibility, respiratory phasicity and response to augmentation. Popliteal Vein: No evidence of thrombus. Normal compressibility, respiratory phasicity and response to augmentation. Calf Veins: No evidence of thrombus. Normal compressibility and flow  on color Doppler imaging. Superficial Great Saphenous Vein: No evidence of thrombus. Normal compressibility. Venous Reflux:  None. Other Findings: No evidence of superficial thrombophlebitis or abnormal fluid collection. LEFT LOWER EXTREMITY Common Femoral Vein: No evidence of thrombus. Normal compressibility, respiratory phasicity and response to augmentation. Saphenofemoral Junction: No evidence of thrombus. Normal compressibility and flow on color Doppler imaging. Profunda Femoral Vein: No evidence of thrombus. Normal compressibility and flow on color Doppler imaging. Femoral Vein: No evidence of thrombus. Normal compressibility, respiratory phasicity and response to augmentation. Popliteal Vein: No evidence of thrombus. Normal compressibility, respiratory phasicity and response to augmentation. Calf Veins: No evidence of thrombus. Normal compressibility and flow on color Doppler imaging. Superficial Great Saphenous Vein: No evidence of thrombus.  Normal compressibility. Venous Reflux:  None. Other Findings: No evidence of superficial thrombophlebitis or abnormal fluid collection. IMPRESSION: No evidence of deep venous thrombosis in either lower extremity. Electronically Signed   By: Irish Lack M.D.   On: 10/25/2022 13:33   MR ABDOMEN MRCP W WO CONTAST  Result Date: 10/25/2022 CLINICAL DATA:  Inpatient. Possible liver mass on CT angiogram performed for right upper quadrant abdominal pain. EXAM: MRI ABDOMEN WITHOUT AND WITH CONTRAST (INCLUDING MRCP) TECHNIQUE: Multiplanar multisequence MR imaging of the abdomen was performed both before and after the administration of intravenous contrast. Heavily T2-weighted images of the biliary and pancreatic ducts were obtained, and three-dimensional MRCP images were rendered by post processing. CONTRAST:  7mL GADAVIST GADOBUTROL 1 MMOL/ML IV SOLN COMPARISON:  10/22/2022 CT angiogram of the chest, abdomen and pelvis. FINDINGS: Lower chest: No acute abnormality at the  lung bases. Hepatobiliary: There is a dominant poorly marginated heterogeneously enhancing 4.4 x 3.4 cm liver mass centered in central segment 4 left liver (series 24/image 38), with associated marked intrahepatic biliary ductal dilatation throughout the left liver lobe with relatively nondilated right liver lobe intrahepatic bile ducts. There are numerous similar clustered satellite liver lesions with targetoid enhancement widely scattered throughout the entire left liver lobe and with involvement of portions of the segment 8 right liver lobe and caudate lobe as best seen on diffusion-weighted sequence (series 9/images 11 and 14). Representative 2.1 x 1.3 cm posterior segment 3 left liver mass (series 17/image 46) and representative 1.2 x 1.1 cm liver mass along the borders of segments 4A and 8 (series 17/image 35). No hepatic steatosis. Cholecystectomy. Top-normal caliber common bile duct with diameter 6 mm. No choledocholithiasis. Pancreas: No pancreatic mass or duct dilation.  No pancreas divisum. Spleen: Normal size. No mass. Adrenals/Urinary Tract: Normal adrenals. No hydronephrosis. Subcentimeter simple bilateral renal cortical cysts, for which no follow-up imaging is recommended. No suspicious renal masses. No hydronephrosis. Stomach/Bowel: Normal non-distended stomach. Visualized small and large bowel is normal caliber, with no bowel wall thickening. Vascular/Lymphatic: Atherosclerotic nonaneurysmal abdominal aorta. Patent portal, splenic, hepatic and renal veins. Mildly enlarged 1.0 cm porta hepatis node (series 19/image 51). No additional pathologically enlarged lymph nodes in the abdomen. Other: No abdominal ascites or focal fluid collection. Musculoskeletal: Indeterminate focus of enhancement in the right T9 posterior elements on coronal postcontrast sequence (series 23/image 67). IMPRESSION: 1. Dominant poorly marginated heterogeneously enhancing 4.4 x 3.4 cm liver mass centered in the central segment  4 left liver with marked intrahepatic biliary ductal dilatation in the left liver lobe. 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 2. Numerous similar enhancing satellite liver masses scattered predominantly in the left liver lobe with some involvement of the segment 8 right liver and caudate lobe, compatible with widespread liver metastases. 3. Mildly enlarged porta hepatis lymph node, indeterminate, potentially metastatic. 4. Indeterminate focus of enhancement in the right T9 posterior elements, incompletely characterized on this MRI abdomen study, cannot exclude bone metastasis. A dedicated thoracic spine MRI without and with IV contrast is recommended for further evaluation. Alternatively, PET-CT could be obtained for staging evaluation. Electronically Signed   By: Delbert Phenix M.D.   On: 10/25/2022 09:54   MR 3D Recon At Scanner  Result Date: 10/25/2022 CLINICAL DATA:  Inpatient. Possible liver mass on CT angiogram performed for right upper quadrant abdominal pain. EXAM: MRI ABDOMEN WITHOUT AND WITH CONTRAST (INCLUDING MRCP) TECHNIQUE: Multiplanar multisequence MR imaging of the abdomen was performed both before and after the administration of intravenous contrast.  Heavily T2-weighted images of the biliary and pancreatic ducts were obtained, and three-dimensional MRCP images were rendered by post processing. CONTRAST:  7mL GADAVIST GADOBUTROL 1 MMOL/ML IV SOLN COMPARISON:  10/22/2022 CT angiogram of the chest, abdomen and pelvis. FINDINGS: Lower chest: No acute abnormality at the lung bases. Hepatobiliary: There is a dominant poorly marginated heterogeneously enhancing 4.4 x 3.4 cm liver mass centered in central segment 4 left liver (series 24/image 38), with associated marked intrahepatic biliary ductal dilatation throughout the left liver lobe with relatively nondilated right liver lobe intrahepatic bile ducts. There are numerous similar clustered satellite liver lesions with  targetoid enhancement widely scattered throughout the entire left liver lobe and with involvement of portions of the segment 8 right liver lobe and caudate lobe as best seen on diffusion-weighted sequence (series 9/images 11 and 14). Representative 2.1 x 1.3 cm posterior segment 3 left liver mass (series 17/image 46) and representative 1.2 x 1.1 cm liver mass along the borders of segments 4A and 8 (series 17/image 35). No hepatic steatosis. Cholecystectomy. Top-normal caliber common bile duct with diameter 6 mm. No choledocholithiasis. Pancreas: No pancreatic mass or duct dilation.  No pancreas divisum. Spleen: Normal size. No mass. Adrenals/Urinary Tract: Normal adrenals. No hydronephrosis. Subcentimeter simple bilateral renal cortical cysts, for which no follow-up imaging is recommended. No suspicious renal masses. No hydronephrosis. Stomach/Bowel: Normal non-distended stomach. Visualized small and large bowel is normal caliber, with no bowel wall thickening. Vascular/Lymphatic: Atherosclerotic nonaneurysmal abdominal aorta. Patent portal, splenic, hepatic and renal veins. Mildly enlarged 1.0 cm porta hepatis node (series 19/image 51). No additional pathologically enlarged lymph nodes in the abdomen. Other: No abdominal ascites or focal fluid collection. Musculoskeletal: Indeterminate focus of enhancement in the right T9 posterior elements on coronal postcontrast sequence (series 23/image 67). IMPRESSION: 1. Dominant poorly marginated heterogeneously enhancing 4.4 x 3.4 cm liver mass centered in the central segment 4 left liver with marked intrahepatic biliary ductal dilatation in the left liver lobe. 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 2. Numerous similar enhancing satellite liver masses scattered predominantly in the left liver lobe with some involvement of the segment 8 right liver and caudate lobe, compatible with widespread liver metastases. 3. Mildly enlarged porta hepatis  lymph node, indeterminate, potentially metastatic. 4. Indeterminate focus of enhancement in the right T9 posterior elements, incompletely characterized on this MRI abdomen study, cannot exclude bone metastasis. A dedicated thoracic spine MRI without and with IV contrast is recommended for further evaluation. Alternatively, PET-CT could be obtained for staging evaluation. Electronically Signed   By: Delbert Phenix M.D.   On: 10/25/2022 09:54   ECHOCARDIOGRAM COMPLETE  Result Date: 10/23/2022    ECHOCARDIOGRAM REPORT   Patient Name:   Erin Hoffman Date of Exam: 10/23/2022 Medical Rec #:  [id number]          Height:       69.0 in Accession #:    [id number]         Weight:       181.0 lb Date of Birth:  1945-02-03          BSA:          1.980 m Patient Age:    77 years           BP:           195/93 mmHg Patient Gender: F                  HR:  45 bpm. [facility] Procedure: 2D Echo, Cardiac Doppler and Color Doppler Indications:    Atrial Fibrillation  History:        Patient has no prior history of Echocardiogram examinations.                 Arrythmias:Atrial Fibrillation; Risk Factors:Hypertension,                 Diabetes and Dyslipidemia.  Sonographer:    Mikki Harbor Referring Phys: [id number] ASIA B ZIERLE-GHOSH  Sonographer Comments: Image acquisition challenging due to respiratory motion. IMPRESSIONS  1. Left ventricular ejection fraction, by estimation, is 60 to 65%. The left ventricle has normal function. The left ventricle has no regional wall motion abnormalities. There is mild left ventricular hypertrophy. Left ventricular diastolic parameters are indeterminate.  2. Right ventricular systolic function is normal. The right ventricular size is normal. There is mildly elevated pulmonary artery systolic pressure.  3. Left atrial size was mildly dilated.  4. The mitral valve is abnormal. Mild mitral valve regurgitation. No evidence of mitral stenosis.  5. The tricuspid valve is  abnormal. Tricuspid valve regurgitation is mild to moderate.  6. The aortic valve is tricuspid. There is mild calcification of the aortic valve. There is mild thickening of the aortic valve. Aortic valve regurgitation is not visualized. No aortic stenosis is present.  7. The inferior vena cava is normal in size with greater than 50% respiratory variability, suggesting right atrial pressure of 3 mmHg. FINDINGS  Left Ventricle: Left ventricular ejection fraction, by estimation, is 60 to 65%. The left ventricle has normal function. The left ventricle has no regional wall motion abnormalities. The left ventricular internal cavity size was normal in size. There is  mild left ventricular hypertrophy. Left ventricular diastolic parameters are indeterminate. Right Ventricle: The right ventricular size is normal. Right vetricular wall thickness was not well visualized. Right ventricular systolic function is normal. There is mildly elevated pulmonary artery systolic pressure. The tricuspid regurgitant velocity  is 3.02 m/s, and with an assumed right atrial pressure of 8 mmHg, the estimated right ventricular systolic pressure is 44.5 mmHg. Left Atrium: Left atrial size was mildly dilated. Right Atrium: Right atrial size was normal in size. Pericardium: There is no evidence of pericardial effusion. Mitral Valve: The mitral valve is abnormal. Mild mitral valve regurgitation. No evidence of mitral valve stenosis. MV peak gradient, 3.8 mmHg. The mean mitral valve gradient is 1.0 mmHg. Tricuspid Valve: The tricuspid valve is abnormal. Tricuspid valve regurgitation is mild to moderate. No evidence of tricuspid stenosis. Aortic Valve: The aortic valve is tricuspid. There is mild calcification of the aortic valve. There is mild thickening of the aortic valve. There is mild aortic valve annular calcification. Aortic valve regurgitation is not visualized. No aortic stenosis  is present. Aortic valve mean gradient measures 4.5 mmHg.  Aortic valve peak gradient measures 10.4 mmHg. Aortic valve area, by VTI measures 2.10 cm. Pulmonic Valve: The pulmonic valve was not well visualized. Pulmonic valve regurgitation is not visualized. No evidence of pulmonic stenosis. Aorta: The aortic root is normal in size and structure. Venous: The inferior vena cava is normal in size with greater than 50% respiratory variability, suggesting right atrial pressure of 3 mmHg. IAS/Shunts: No atrial level shunt detected by color flow Doppler.  LEFT VENTRICLE PLAX 2D LVIDd:         5.10 cm   Diastology LVIDs:         3.70 cm  LV e' medial:    7.40 cm/s LV PW:         1.10 cm   LV E/e' medial:  11.0 LV IVS:        1.10 cm   LV e' lateral:   13.20 cm/s LVOT diam:     1.90 cm   LV E/e' lateral: 6.2 LV SV:         81 LV SV Index:   41 LVOT Area:     2.84 cm  RIGHT VENTRICLE RV Basal diam:  3.95 cm RV Mid diam:    3.40 cm RV S prime:     10.00 cm/s LEFT ATRIUM           Index        RIGHT ATRIUM           Index LA diam:      4.10 cm 2.07 cm/m   RA Area:     20.70 cm LA Vol (A2C): 87.2 ml 44.04 ml/m  RA Volume:   63.20 ml  31.92 ml/m LA Vol (A4C): 74.7 ml 37.72 ml/m  AORTIC VALVE                    PULMONIC VALVE AV Area (Vmax):    2.10 cm     PV Vmax:       0.68 m/s AV Area (Vmean):   2.14 cm     PV Peak grad:  1.8 mmHg AV Area (VTI):     2.10 cm AV Vmax:           161.00 cm/s AV Vmean:          99.650 cm/s AV VTI:            0.386 m AV Peak Grad:      10.4 mmHg AV Mean Grad:      4.5 mmHg LVOT Vmax:         119.00 cm/s LVOT Vmean:        75.200 cm/s LVOT VTI:          0.285 m LVOT/AV VTI ratio: 0.74  AORTA Ao Root diam: 3.10 cm MITRAL VALVE               TRICUSPID VALVE MV Area (PHT): 3.06 cm    TR Peak grad:   36.5 mmHg MV Area VTI:   2.56 cm    TR Vmax:        302.00 cm/s MV Peak grad:  3.8 mmHg MV Mean grad:  1.0 mmHg    SHUNTS MV Vmax:       0.98 m/s    Systemic VTI:  0.29 m MV Vmean:      32.1 cm/s   Systemic Diam: 1.90 cm MV Decel Time: 248 msec MV E  velocity: 81.40 cm/s Dina Rich MD Electronically signed by Dina Rich MD Signature Date/Time: 10/23/2022/12:40:48 PM    Final    CT Angio Chest/Abd/Pel for Dissection W and/or Wo Contrast  Result Date: 10/22/2022 CLINICAL DATA:  Right upper abdominal pain radiating to the back EXAM: CT ANGIOGRAPHY CHEST, ABDOMEN AND PELVIS TECHNIQUE: Non-contrast CT of the chest was initially obtained. Multidetector CT imaging through the chest, abdomen and pelvis was performed using the standard protocol during bolus administration of intravenous contrast. Multiplanar reconstructed images and MIPs were obtained and reviewed to evaluate the vascular anatomy. RADIATION DOSE REDUCTION: This exam was performed according to the departmental dose-optimization program which includes automated exposure control, adjustment of  the mA and/or kV according to patient size and/or use of iterative reconstruction technique. CONTRAST:  OMNIPAQUE IOHEXOL 300 MG/ML  SOLN COMPARISON:  CT 04/28/2015 FINDINGS: CTA CHEST FINDINGS Cardiovascular: Non contrasted images of the chest demonstrate no acute intramural hematoma. Nonaneurysmal aorta. Normal cardiac size. No pericardial effusion. No dissection. Small amount of aortic atherosclerosis. Minimal coronary vascular calcification. Mediastinum/Nodes: Midline trachea. No thyroid mass. No suspicious lymph nodes. Esophagus within normal limits. Lungs/Pleura: Emphysema. No acute airspace disease, pleural effusion or pneumothorax. Scattered pulmonary nodules, for example 3 mm left apical lung nodule on series 7, image 37. Right apical ground-glass pulmonary nodule measuring 7 mm on series 7, image 49. Musculoskeletal: No acute or suspicious osseous abnormality Review of the MIP images confirms the above findings. CTA ABDOMEN AND PELVIS FINDINGS VASCULAR Aorta: Normal caliber aorta without aneurysm, dissection, vasculitis or significant stenosis. Celiac: Patent without evidence of  aneurysm, dissection, vasculitis or significant stenosis. SMA: Patent without evidence of aneurysm, dissection, vasculitis or significant stenosis. Renals: Both renal arteries are patent without evidence of aneurysm, dissection, vasculitis, fibromuscular dysplasia or significant stenosis. IMA: Patent without evidence of aneurysm, dissection, vasculitis or significant stenosis. Inflow: Patent without evidence of aneurysm, dissection, vasculitis or significant stenosis. Veins: Not adequately assessed Review of the MIP images confirms the above findings. NON-VASCULAR Hepatobiliary: Status post cholecystectomy CBD measures 8 mm, within normal limits for post cholecystectomy patient. Poorly defined heterogenous mass is suspected in the left hepatic lobe. Somewhat wedge-shaped hypodensity at the left hepatic lobe more superior could reflect altered perfusion. Pancreas: Unremarkable. No pancreatic ductal dilatation or surrounding inflammatory changes. Spleen: Normal in size without focal abnormality. Adrenals/Urinary Tract: Adrenal glands are unremarkable. Kidneys are normal, without renal calculi, focal lesion, or hydronephrosis. Bladder is unremarkable. Stomach/Bowel: Stomach is within normal limits. Appendix appears normal. No evidence of bowel wall thickening, distention, or inflammatory changes. Lymphatic: 13 mm portal caval node on series 5, image 121. 7 mm distal right Peri aortic node on series 5, image 145. Small subcentimeter lymph nodes in the anterior cardio phrenic fat. These measure up to 11 mm on coronal series 11, image 30. Reproductive: Hysterectomy.  No adnexal mass Other: Negative for pelvic effusion or free air. Musculoskeletal: No acute or suspicious osseous abnormality Review of the MIP images confirms the above findings. IMPRESSION: 1. Negative for acute aortic dissection or aneurysm. 2. Suspicion of a defined heterogenous mass is suspected in the left hepatic lobe, but limited assessment secondary  to phase of contrast enhancement. Further evaluation with MRI is suggested. 3. Mildly enlarged portal caval node and small lymph nodes in the anterior cardiophrenic fat, nonspecific. 4. Emphysema. Scattered pulmonary nodules, largest is a 7 mm right apical ground-glass nodule, these are potentially not incidental given the findings in the liver. 5. Aortic atherosclerosis. Aortic Atherosclerosis (ICD10-I70.0) and Emphysema (ICD10-J43.9). Electronically Signed   By: Jasmine Pang M.D.   On: 10/22/2022 20:24

## 2022-11-13 NOTE — Patient Instructions (Addendum)
[facility] at [facility] Discharge Instructions   You were seen and examined today by Dr. Ellin Saba.  He discussed with you the results of the biopsy of the liver. 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 Further testing on the tissue is showing this cancer is either arising from the liver or from the pancreas. On the PET scan, the pancreas was okay, so Dr. Kirtland Bouchard believes this cancer is arising from the liver.   He reviewed the results of your PET scan. It is showing the known cancer in the liver. It is also showing this cancer has spread to the bottom of one of the lungs, the spine in your mid back, and one of your ribs. 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 This is not a cancer we can cure. But we can keep it under control with a combination of chemotherapy and immunotherapy.   Dr. Kirtland Bouchard discussed with you treatment options. He talked about treatment with two chemo drugs called cisplatin and gemcitabine and an immunotherapy drug called Imfinzi. You will have an individual chemotherapy education session with our educator to discuss the drugs and side effects in detail, how to manage side effects, as well as your treatment schedule and what at day of treatment in the clinic would be like for you.   We will arrange for you to have radiation to the area of your spine that is causing you pain. This will help relieve your pain.   You will need a port placed in order to administer the chemotherapy. This will be done in [place] in interventional radiology.    Thank you for choosing [facility] to provide your oncology and hematology care.  To afford each patient quality time with our provider, please arrive at least 15 minutes before your scheduled appointment time.   If you have a lab appointment with the Cancer Center please come in thru the Main Entrance and check in at the main information desk.  You need to re-schedule your  appointment should you arrive 10 or more minutes late.  We strive to give you quality time with our providers, and arriving late affects you and other patients whose appointments are after yours.  Also, if you no show three or more times for appointments you may be dismissed from the clinic at the providers discretion.     Again, thank you for choosing [facility].  Our hope is that these requests will decrease the amount of time that you wait before being seen by our physicians.       _____________________________________________________________  Should you have questions after your visit to [facility], please contact our office at [phone] and follow the prompts.  Our office hours are 8:00 a.m. and 4:30 p.m. Monday - Friday.  Please note that voicemails left after 4:00 p.m. may not be returned until the following business day.  We are closed weekends and major holidays.  You do have access to a nurse 24-7, just call the main number to the clinic [phone] and do not press any options, hold on the line and a nurse will answer the phone.    For prescription refill requests, have your pharmacy contact our office and allow 72 hours.    Due to Covid, you will need to wear a mask upon entering the hospital. If you do not have a mask, a mask will be given to you at  the Main Entrance upon arrival. For doctor visits, patients may have 1 support person age 63 or older with them. For treatment visits, patients can not have anyone with them due to social distancing guidelines and our immunocompromised population.

## 2022-11-13 NOTE — Progress Notes (Signed)
START ON PATHWAY REGIMEN - Hepatobiliary     Cycles 1 through up to 8: A cycle is every 21 days:     Durvalumab      Gemcitabine      Cisplatin    Cycles 9 and beyond: A cycle is every 28 days:     Durvalumab   **Always confirm dose/schedule in your pharmacy ordering system**  Patient Characteristics: Cholangiocarcinoma, Unresectable/Metastatic, Systemic Therapy, First Line Hepatobiliary Disease Type: Cholangiocarcinoma Line of therapy: First Line Intent of Therapy: Non-Curative / Palliative Intent, Discussed with Patient

## 2022-11-15 ENCOUNTER — Other Ambulatory Visit: Payer: Self-pay

## 2022-11-15 ENCOUNTER — Inpatient Hospital Stay: Payer: HMO | Admitting: Licensed Clinical Social Worker

## 2022-11-15 ENCOUNTER — Inpatient Hospital Stay: Payer: HMO

## 2022-11-15 DIAGNOSIS — C221 Intrahepatic bile duct carcinoma: Secondary | ICD-10-CM

## 2022-11-15 MED ORDER — LIDOCAINE-PRILOCAINE 2.5-2.5 % EX CREA: TOPICAL_CREAM | CUTANEOUS | 3 refills | Status: DC

## 2022-11-15 MED ORDER — PROCHLORPERAZINE MALEATE 10 MG PO TABS: 10.0000 mg | ORAL_TABLET | Freq: Four times a day (QID) | ORAL | 3 refills | Status: DC | PRN

## 2022-11-15 NOTE — Progress Notes (Signed)
[facility] Clinical Social Work  Initial Assessment   Erin Hoffman is a 78 y.o. year old female contacted by phone. Clinical Social Work was referred by medical provider for assessment of psychosocial needs.   SDOH (Social Determinants of Health) assessments performed: Yes   SDOH Screenings   Food Insecurity: No Food Insecurity (10/23/2022)  Housing: Low Risk  (10/23/2022)  Transportation Needs: No Transportation Needs (10/23/2022)  Utilities: Not At Risk (10/23/2022)  Tobacco Use: Medium Risk (10/22/2022)     Distress Screen completed: No     No data to display            Family/Social Information:  Housing Arrangement: patient lives alone Family members/support persons in your life? Pt states she has 2 sons who reside close by and 1 who lives approximately 1 hr away.  Per pt her sons are readily available to assist when needed as well as neighbors who provide support.   Transportation concerns: yes  Employment: Retired .  Income source: Actor concerns: No Type of concern: None Food access concerns: no Religious or spiritual practice: Data processing manager Currently in place:  none  Coping/ Adjustment to diagnosis: Patient understands treatment plan and what happens next? yes Concerns about diagnosis and/or treatment: Overwhelmed by information, How will I care for myself, and Quality of life Patient reported stressors: Adjusting to my illness Hopes and/or priorities: Pt's priority is to start treatment w/ the hope of positive results Patient enjoys time with family/ friends Current coping skills/ strengths: Capable of independent living , Motivation for treatment/growth , and Supportive family/friends     SUMMARY: Current SDOH Barriers:  No barriers identified at this time  Clinical Social Work Clinical Goal(s):  No clinical social work goals at this time  Interventions: Discussed common feeling and emotions when being diagnosed  with cancer, and the importance of support during treatment Informed patient of the support team roles and support services at [facility] Provided CSW contact information and encouraged patient to call with any questions or concerns Referred patient to Marijean Niemann   Follow Up Plan: Patient will contact CSW with any support or resource needs Patient verbalizes understanding of plan: Yes    Rachel Moulds, LCSW Clinical Social Worker [facility]

## 2022-11-15 NOTE — Patient Instructions (Addendum)
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  You will receive treatment in the clinic with a combination of chemotherapy drugs.  Those drugs are cisplatin and gemcitabine (Gemzar).  You will also receive an immunotherapy drug called durvalumab (Imfinzi).  You will receive all three drugs on the first day (Day 1) of each treatment cycle.  On Day 15 you will receive Cisplatin and Gemzar only.  The intent of treatment is Palliative meaning to keep under control and not spread.    The intent of treatment is to control your cancer, prevent it from spreading further, and to alleviate any symptoms you may be having related to your disease.  You will see the doctor regularly throughout treatment. We will obtain blood work from you prior to every treatment and monitor your results to make sure it is safe to give your treatment. The doctor monitors your response to treatment by the way you are feeling, your blood work, and by obtaining scans periodically.  There will be wait times while you are here for treatment.  It will take about 30 minutes to 1 hour for your lab work to result.  Then there will be wait times while pharmacy mixes your medications.    Medications you will receive in the clinic prior to your chemotherapy medications:  Aloxi:  ALOXI is used in adults to help prevent nausea and vomiting that happens with certain chemotherapy drugs.  Aloxi is a long acting medication, and will remain in your system for about two days.   Emend:  This is an anti-nausea medication that is used with Aloxi to help prevent nausea and vomiting caused by chemotherapy.  Dexamethasone:  This is a steroid given prior to chemotherapy to help prevent allergic reactions; it may also help prevent and control nausea and diarrhea.    CISPLATIN  About This Drug Cisplatin is a drug used to treat cancer. This drug is given in the vein (IV).  This will  take 1 hour to infuse.  With this drug you will receive 2 hours of IV fluid hydration prior to administration, and 1 hour of IV hydration after administration.  This is to help protect your kidneys.  You will have to urinate 200 mL prior to receiving this medication.  We will give you something to measure your urine in.   Possible Side Effects (More Common)  This drug may affect how your kidneys work. Your kidney function will be checked as needed.   Electrolyte changes. Your blood will be checked for electrolyte changes as needed.   High-frequency hearing loss may occur. You will get IV fluids before and during the Cisplatin infusion to help prevent this. You may also get ringing in the ears.   Bone marrow depression. This is a decrease in the number of white blood cells, red blood cells, and platelets. This may raise your risk of infection, make you tired and weak (fatigue), and raise your risk of bleeding.   Nausea and throwing up (vomiting). These symptoms may happen within a few hours after your treatment and may last for a few days to a week. Medicines are available to stop or lessen these side effects.  Possible Side Effects (Less Common)  Effects on the nerves are called peripheral neuropathy. You may feel numbness or pain in your hands and feet. It may be hard for you to button your clothes, open jars, or walk as usual. The effect on  the nerves may get worse with more doses of the drug. These effects get better in some people after the drug is stopped, but it does not get better in all people.   Blurred vision or other changes in eyesight.   Soreness of the mouth and throat. You may have red areas, white patches, or sores that hurt.   Hair loss. You may notice your hair getting thin. Some patients lose their hair. Your hair often grows back when treatment is done.  Allergic Reactions Allergic reactions to this drug are rare, but may happen in some patients. Signs of allergic reactions  to this drug may be a rash, fever, chills, feeling dizzy, trouble breathing, and/or feeling that your heart is beating in a fast or not normal way.  Treating Side Effects  Drink 6-8 cups of fluids each day unless your doctor has told you to limit your fluid intake due to some other health problem. A cup is 8 ounces of fluid. If you throw up or have loose bowel movements you should drink more fluids so that you do not become dehydrated (lack water in the body due to losing too much fluid).   If you have numbness and tingling in your hands and feet, be careful when cooking, walking, and handling sharp objects and hot liquids.   Mouth care is very important. Your mouth care should consist of routine, gentle cleaning of your teeth or dentures and rinsing your mouth with a mixture of 1/2 teaspoon of salt in 8 ounces of water or  teaspoon of baking soda in 8 ounces of water. This should be done at least after each meal and at bedtime.   If you have mouth sores, avoid mouthwash that has alcohol. Also avoid alcohol and smoking because they can bother your mouth and throat.   Talk with your nurse about getting a wig before you lose your hair. Also, call the [employer] at 800-ACS-2345 to find out information about the "Look Good, Feel Better" program close to where you live. It is a free program where women getting chemotherapy can learn about wigs, turbans and scarves as well as makeup techniques and skin and nail care.  Food and Drug Interactions  There are no known interactions of Cisplatin with food. This drug may interact with other medicines. Tell your doctor and pharmacist about all the medicines and dietary supplements (vitamins, minerals, herbs and others) that you are taking at this time. The safety and use of dietary supplements and alternative diets are often not known. Using these might affect your cancer or interfere with your treatment. Until more is known, you should not use  dietary supplements or alternative diets without your cancer doctor's help.  When to Call the Doctor  Call your doctor or nurse right away if you have any of these symptoms:  Rash or itching   Feeling dizzy or lightheaded   Wheezing or trouble breathing   Swelling of the face   Fever of 100.4 F (38 C) or above   Chills   Easy bleeding or bruising   Decreased urine   Weight gain of 5 pounds in one week (fluid retention)   Nausea that stops you from eating or drinking   Throwing up more than 3 times a day  Call your doctor or nurse as soon as possible if you have these symptoms:  Numbness, tingling, decreased feeling or weakness in fingers, toes, arms, or legs   Trouble walking or changes in  the way you walk, feeling clumsy when buttoning clothes, opening jars, or other routine hand motions   Blurred vision or other changes in eyesight   Changes in hearing, ringing in the ears   Pain in your mouth or throat that makes it hard to eat or drink   Fatigue that interferes with your daily activities  Sexual Problems and Reproductive Concerns   Infertility warning: Sexual problems and reproduction concerns may occur. In both men and women, this drug may affect your ability to have children. This cannot be determined before your treatment. Speak with your doctor or nurse if you plan to have children. Ask for information on sperm or egg banking.   In men, this drug may interfere with your ability to make sperm, but it should not change your ability to have sexual relations.   In women, menstrual bleeding may become irregular or stop while you are receiving this drug. Do not assume that you cannot become pregnant if you do not have a menstrual period.   Women may experience signs of menopause like vaginal dryness, itching, and pain during sexual relations   Genetic counseling is available for you to talk about the effects of this drug therapy on future pregnancies. Also, a  genetic counselor can look at the possible risk of problems in the unborn baby due to this medicine if an exposure happens during pregnancy.   Gemcitabine (Gemzar)  About This Drug Gemcitabine is used to treat cancer. It is given in the vein (IV).  It will take 30 minutes to infuse.  Possible Side Effects  Bone marrow suppression. This is a decrease in the number of white blood cells, red blood cells, and platelets. This may raise your risk of infection, make you tired and weak (fatigue), and raise your risk of bleeding.   Fever   Trouble breathing   Nausea and throwing up (vomiting)   Changes in your liver function   Increased protein in your urine, which can affect how your kidneys work   Blood in your urine   Rash   Swelling of your legs, ankles and/or feet  Note: Each of the side effects above was reported in 20% or greater of patients treated with Gemcitabine. Not all possible side effects are included above.  Warnings and Precautions  Severe bone marrow suppression   Inflammation (swelling) of the lungs and/or thickening of the lung tissues, which may be lifethreatening. You may have a dry cough or trouble breathing.   Changes in your kidney function, which can cause kidney failure   Changes in your liver function, which can cause liver failure and may be life-threatening   If you have received radiation treatments, your skin may become red and/or you may develop soreness of the mouth and throat after gemcitabine. This reaction is called "recall." Your body is recalling, or remembering, that it had radiation therapy.   A syndrome where fluid from your veins can leak into your tissues and cause a decrease in your blood pressure and fluid to accumulate in your tissues and/or lungs.   A syndrome can occur that causes changes to kidney and liver function in combination with a decrease in red blood cells. Kidney failure may result which may be life-threatening.   Changes  in your central nervous system can happen. The central nervous system is made up of your brain and spinal cord. You could feel extreme tiredness, agitation, confusion, hallucinations (see or hear things that are not there), have trouble understanding  or speaking, loss of control of your bowels or bladder, eyesight changes, numbness or lack of strength to your arms, legs, face, or body, seizures or coma. If you start to have any of these symptoms let your doctor know right away.  Note: Some of the side effects above are very rare. If you have concerns and/or questions, please discuss them with your medical team.  Important Information  This drug may be present in the saliva, tears, sweat, urine, stool, vomit, semen, and vaginal secretions. Talk to your doctor and/or your nurse about the necessary precautions to take during this time.  Treating Side Effects  Manage tiredness by pacing your activities for the day.   Be sure to include periods of rest between energy-draining activities.   To decrease the risk of infection, wash your hands regularly.   Avoid close contact with people who have a cold, the flu, or other infections.   Take your temperature as your doctor or nurse tells you, and whenever you feel like you may have a fever.   To help decrease bleeding, use a soft toothbrush. Check with your nurse before using dental floss.   Be very careful when using knives or tools.   Use an electric shaver instead of a razor.   Drink plenty of fluids (a minimum of eight glasses per day is recommended).   If you throw up or have loose bowel movements, you should drink more fluids so that you do not become dehydrated (lack of water in the body from losing too much fluid).   To help with nausea and vomiting, eat small, frequent meals instead of three large meals a day. Choose foods and drinks that are at room temperature. Ask your nurse or doctor about other helpful tips and medicine that is  available to help stop or lessen these symptoms.   If you get a rash do not put anything on it unless your doctor or nurse says you may. Keep the area around the rash clean and dry. Ask your doctor for medicine if your rash bothers you.   If you received radiation, and your skin becomes red or irritated again, or you develop soreness of the mouth and throat, follow the same care instructions you did during radiation treatment. Be sure to tell the nurse or doctor administering your chemotherapy about your skin changes.  Food and Drug Interactions  There are no known interactions of gemcitabine with food.   This drug may interact with other medicines. Tell your doctor and pharmacist about all the prescription and over-the-counter medicines and dietary supplements (vitamins, minerals, herbs and others) that you are taking at this time. Also, check with your doctor or pharmacist before starting any new prescription or over-the-counter medicines, or dietary supplements to make sure that there are no interactions.  When to Call the Doctor Call your doctor or nurse if you have any of these symptoms and/or any new or unusual symptoms:   Fever of 100.4 F (38 C) or higher   Chills   Tiredness that interferes with your daily activities   Feeling dizzy or lightheaded   Pain in your chest   Dry cough   Wheezing and/or trouble breathing   Confusion and/or agitation   Symptoms of a seizure such as confusion, blacking out, passing out, loss of hearing or vision, blurred vision, unusual smells or tastes (such as burning rubber), trouble talking, tremors or shaking in parts or all of the body, repeated body movements, tense muscles that  do not relax, and loss of control of urine and bowels. If you or your family member suspects you are having a seizure, call 911 right away.   Hallucinations   Trouble understanding or speaking   Blurry vision or changes in your eyesight   Numbness or lack of  strength to your arms, legs, face, or body   Easy bleeding or bruising   Nausea that stops you from eating or drinking and/or is not relieved by prescribed medicines   Throwing up    Swelling of legs, ankles, or feet   Weight gain of 5 pounds in one week (fluid retention)   Blood in urine   Decreased urine or very dark urine   Foamy or bubbly-looking urine   A new rash/itching or a rash that is not relieved by prescribed medicines   Signs of possible liver problems: dark urine, pale bowel movements, bad stomach pain, feeling very tired and weak, unusual itching, or yellowing of the eyes or skin   If you think you may be pregnant or may have impregnated your partner  Reproduction Warnings   Pregnancy warning: This drug can have harmful effects on the unborn baby. Women of childbearing potential should use effective methods of birth control during your cancer treatment and for 6 months after treatment. Men with female partners of childbearing potential should use effective methods of birth control during your cancer treatment and for 3 months after your cancer treatment. Let your doctor know right away if you think you may be pregnant or may have impregnated your partner.   Breastfeeding warning: Women should not breastfeed during treatment and for 1 week after treatment because this drug could enter the breast milk and cause harm to a breastfeeding baby.   Fertility warning: In men, this drug may affect your ability to have children in the future. Talk with your doctor or nurse if you plan to have children. Ask for information on sperm banking.   Durvalumab (Imfinzi)    About This Drug  Durvalumab is used to treat cancer. This drug is given in the vein (IV). It will take 1 hour to infuse.    Possible Side Effects   Nausea   Tiredness   Weakness   Inflammation (swelling) of the lungs. You may have a dry cough or trouble breathing.   Cough   Trouble breathing   Upper  respiratory tract infection   Rash   Hair loss. Hair loss is often temporary, although with certain medicine, hair loss can sometimes be permanent. Hair loss may happen suddenly or gradually. If you lose hair, you may lose it from your head, face, armpits, pubic area, chest, and/or legs. You may also notice your hair getting thin.   Note: Each of the side effects above was reported in 20% or greater of patients treated with durvalumab. Your side effects may be different depending on your specific condition. Not all possible side effects are included above.   Warnings and Precautions   This drug works with your immune system and can cause inflammation (swelling) in any of your organs and tissues and can change how they work. This may put you at risk for developing serious medical problems, which can be life-threatening. These side effects may require treatment with steroids at the discretion of your doctor.    Severe inflammation of the lungs, which can be life-threatening. You may have a dry cough or trouble breathing.    Colitis, which is swelling (inflammation) in the colon.  The symptoms are diarrhea (loose bowel movements), stomach cramping, and sometimes blood in the bowel movements.    Changes in your central nervous system can happen. The central nervous system is made up of your brain and spinal cord. You could feel extreme tiredness, agitation, confusion, hallucinations (see or hear things that are not there), trouble understanding or speaking, loss of control of your bowels or bladder, eyesight changes, numbness, or lack of strength to your arms, legs, face, or body, and coma. If you start to have any of these symptoms let your doctor know right away.    Severe changes in your liver function, which can cause liver failure and be life-threatening.    This drug may affect some of your hormone glands (especially the thyroid, adrenals, pituitary, and pancreas).    Blood sugar levels may  change, and you may develop diabetes. If you already have diabetes, changes may need to be made to your diabetes medication.    Changes in your kidney function    Allergic skin reaction, which can be life-threatening. You may develop blisters on your skin that are filled with fluid or a severe red rash all over your body that may be painful.    While you are getting this drug in your vein (IV), you may have a reaction to the drug which can be life-threatening. Sometimes you may be given medication to stop or lessen these side effects. Your nurse will check you closely for these signs: fever or shaking chills, flushing, facial swelling, feeling dizzy, headache, trouble breathing, rash, itching, chest tightness, or chest pain. If this happens, call 911 for emergency care.    Increased risk of serious complications which can be life-threatening such as graft versus host disease (GVHD) in patients who undergo a stem cell transplant before or after receiving durvalumab.    Increased risk of organ rejection in patients who have received donor organs. Note: Some of the side effects above are very rare. If you have concerns and/or questions, please discuss them with your medical team. Important Information    This drug may be present in the saliva, tears, sweat, urine, stool, vomit, semen, and vaginal secretions. Talk to your doctor and/or your nurse about the necessary precautions to take during this time.   Treating Side Effects   Manage tiredness by pacing your activities for the day.    Be sure to include periods of rest between energy-draining activities.    To help decrease the risk of bleeding, use a soft toothbrush. Check with your nurse before using dental floss.  Be very careful when using knives or tools.    Use an electric shaver instead of a razor.    Drink plenty of fluids (a minimum of eight glasses per day is recommended).    To help with nausea, eat small, frequent meals instead of  three large meals a day. Choose foods and drinks that are at room temperature.    If you have diarrhea, eat low-fiber foods that are high in protein and calories, and avoid foods that can irritate your digestive tracts or lead to cramping. You should also drink more fluids so that you do not become dehydrated (lack of water in the body from losing too much fluid).    Ask your doctor or nurse about medicine that is available to help stop or lessen diarrhea, and/or nausea.    If you have diabetes, keep good control of your blood sugar level. Tell your nurse or your doctor  if your glucose levels are higher or lower than normal.    Keeping your pain under control is important to your well-being. Please tell your doctor or nurse if you are experiencing pain.    If you get a rash do not put anything on it unless your doctor or nurse says you may. Keep the area around the rash clean and dry. Ask your doctor for medicine if your rash bothers you.    To help with hair loss, wash with a mild shampoo and avoid washing your hair every day. Avoid coloring your hair.    Avoid rubbing your scalp, pat your hair or scalp dry.    Limit your use of hair spray, electric curlers, blow dryers, and curling irons.    If you are interested in getting a wig, talk to your nurse and they can help you get in touch with programs in your local area.    If you have numbness and tingling in your hands and feet, be careful when cooking, walking, and handling sharp objects and hot liquids.    Infusion reactions may happen after your infusion. If this happens, call 911 for emergency care.   Food and Drug Interactions   There are no known interactions of durvalumab with food.    This drug may interact with other medicines. Tell your doctor and pharmacist about all the prescription and over-the-counter medicines and dietary supplements (vitamins, minerals, herbs, and others) that you are taking at this time. Also, check with  your doctor or pharmacist before starting any new prescription or over-the-counter medicines, or dietary supplements to make sure that there are no interactions.   When to Call the Doctor  Not all possible side effects are included. Some of these side effects, although rare, can be lifethreatening.   Lung problems:   Inflammation of the lungs   Cough   Trouble breathing   Upper respiratory tract infection Call your doctor or nurse if you have any of these symptoms:   Wheezing and/or trouble breathing   New or worsening cough   Coughing up yellow, green, or bloody mucus   Chest pain   Stomach problems:   Decreased appetite (decreased hunger)   Nausea and vomiting (throwing up)   Diarrhea (loose bowel movements)   Constipation (unable to move bowels)   Pain in your abdomen   Inflammation of your colon   Blood in your stool   Call your doctor or nurse if you have any of these symptoms:   Nausea that stops you from eating or drinking or is not relieved by prescribed medicine    Throwing up more than 3 times a day    Lasting loss of appetite or rapid weight loss of five pounds in a week    Diarrhea, 4 times in one day or diarrhea with lack of strength or a feeling of being dizzy    No bowel movement for 3 days or when you feel uncomfortable    Pain in your abdomen that does not go away    Blood in your stool (bright red, or black/tarry)   Liver problems:   Changes in your liver function   Call your doctor or nurse if you have any of these symptoms:   Yellowing of the eyes or skin    Dark urine    Pale bowel movements    Pain on the right side of your abdomen that does not go away    Feeling very tired and weak  Unusual itching     Easy bleeding or bruising   Hormone gland problems:   Changes in some of your hormone glands (especially the thyroid, adrenals, pituitary and pancreas)   Blood sugar levels may change, and you may develop diabetes   Call your doctor  or nurse if you have any of these symptoms:   Headache that does not go away    Tiredness that interferes with your daily activities    Feeling dizzy or lightheaded    Changes in mood or behavior such as irritability and/or feeling forgetful    Shakiness    Weight loss or weight gain    Nausea    Abnormal blood sugar    Unusual thirst or passing urine often    Feeling cold   Kidney problems:   Changes in your kidney function   Urinary tract infection   Call your doctor or nurse if you have any of these symptoms:   Decreased or very dark urine    Cloudy urine and/or urine that smells bad    Difficulty urinating    Pain or burning when you pass urine    Feeling like you have to pass urine often, but not much comes out when you do    Tender or heavy feeling in your lower abdomen    Pain on one side of your back under your ribs   Skin problems:   Rash and itching   Soreness of the mouth and throat   Allergic skin reaction   Call your doctor or nurse if you have any of these symptoms:   New rash and/or itching    Fluid-filled bumps/blisters    Rash that is not relieved by prescribed medicines    Red areas, white patches, or sores in your mouth that hurt   Inflammation of the brain:   Changes in your brain and spinal cord   Headache   Effects on the nerves   Call your doctor or nurse if you have any of these symptoms:   Headache that does not go away    Extreme tiredness, agitation, or confusion    Seizures    Hallucinations    Trouble understanding or speaking    Loss of control of bowels or bladder    Numbness or lack of strength to your arms, legs, face, or body    Numbness, tingling, pins, and needles, or pain in your arms, hands, legs, or feet   Other problems:   Low red blood cells, and platelets   Fever   Inflammation of your eye and/or other changes in vision   Allergic reaction to the drug   Heart problems   Electrolyte changes    Muscle, bone, and joint pain   Call your doctor or nurse if you have any of these symptoms:   Fever of 100.4 F (38 C) or higher    Chills, flushing    Easy bleeding or bruising    Blurred vision or other changes in eyesight    Sensitivity to light    Feeling that your heart is beating fast or in a not normal way (palpitations)    Signs of infusion reaction: fever or shaking chills, flushing, facial swelling, feeling dizzy, headache, trouble breathing, rash, itching, chest tightness, or chest pain. If this happens, call 911 for emergency care.    Pain that does not go away, or is not relieved by prescribed medicines    Extreme muscle weakness   Reproduction Warnings  Pregnancy warning: This drug can have harmful effects on the unborn baby. Women of childbearing potential should use effective methods of birth control during your cancer treatment and for at least 3 months after stopping treatment. Let your doctor know right away if you think you may be pregnant.    Breastfeeding warning: Women should not breastfeed during treatment and for at least 3 months after stopping treatment because this drug could enter the breast milk and cause harm to a breastfeeding baby.    Fertility warning: Fertility studies have not been done with this drug. Talk with your doctor or nurse if you plan to have children. Ask for information on sperm or egg banking.    SELF CARE ACTIVITIES WHILE ON CHEMOTHERAPY/IMMUNOTHERAPY:  Hydration Increase your fluid intake and drink at least 64 ounces (2 liters) of water/decaffeinated beverages per day after treatment. You can still have your cup of coffee or soda but these beverages do not count as part of the 64 ounces that you need to drink daily. Limit alcohol intake.  Medications Continue taking your normal prescription medication as prescribed.  If you start any new herbal or new supplements please let us know first to make sure it is safe.  Mouth  Care Have teeth cleaned professionally before starting treatment. Keep dentures and partial plates clean. Use soft toothbrush and do not use mouthwashes that contain alcohol. Biotene is a good mouthwash that is available at most pharmacies or may be ordered by calling [phone]. Use warm salt water gargles (1 teaspoon salt per 1 quart warm water) before and after meals and at bedtime. If you are still having problems with your mouth or sores in your mouth please call the clinic. If you need dental work, please let the doctor know before you go for your appointment so that we can coordinate the best possible time for you in regards to your chemo regimen. You need to also let your dentist know that you are actively taking chemo. We may need to do labs prior to your dental appointment.  Skin Care Always use sunscreen that has not expired and with SPF (Sun Protection Factor) of 50 or higher. Wear hats to protect your head from the sun. Remember to use sunscreen on your hands, ears, face, & feet.  Use good moisturizing lotions such as udder cream, eucerin, or even Vaseline. Some chemotherapies can cause dry skin, color changes in your skin and nails.    Avoid long, hot showers or baths. Use gentle, fragrance-free soaps and laundry detergent. Use moisturizers, preferably creams or ointments rather than lotions because the thicker consistency is better at preventing skin dehydration. Apply the cream or ointment within 15 minutes of showering. Reapply moisturizer at night, and moisturize your hands every time after you wash them.   Infection Prevention Please wash your hands for at least 30 seconds using warm soapy water. Handwashing is the #1 way to prevent the spread of germs. Stay away from sick people or people who are getting over a cold. If you develop respiratory systems such as green/yellow mucus production or productive cough or persistent cough let us know and we will see if you need an  antibiotic. It is a good idea to keep a pair of gloves on when going into grocery stores/[employer] to decrease your risk of coming into contact with germs on the carts, etc. Carry alcohol hand gel with you at all times and use it frequently if out in public. If your temperature reaches  100.5 or higher please call the clinic and let us know.  If it is after hours or on the weekend please go to the ER if your temperature is over 100.4.  Please have your own personal thermometer at home to use.    Sex and bodily fluids If you are going to have sex, a condom must be used to protect the person that isn't taking immunotherapy. For a few days after treatment, immunotherapy can be excreted through your bodily fluids.  When using the toilet please close the lid and flush the toilet twice.  Do this for a few day after you have had immunotherapy.   Contraception It is not known for sure whether or not immunotherapy drugs can be passed on through semen or secretions from the vagina. Because of this some doctors advise people to use a barrier method if you have sex during treatment. This applies to vaginal, anal or oral sex.  Generally, doctors advise a barrier method only for the time you are actually having the treatment and for about a week after your treatment.  Advice like this can be worrying, but this does not mean that you have to avoid being intimate with your partner. You can still have close contact with your partner and continue to enjoy sex.  Animals If you have cats or birds we ask that you not change the litter or change the cage.  Please have someone else do this for you while you are on immunotherapy.   Food Safety During and After Cancer Treatment Food safety is important for people both during and after cancer treatment. Cancer and cancer treatments, such as chemotherapy, radiation therapy, and stem cell/bone marrow transplantation, often weaken the immune system. This makes it harder for your  body to protect itself from foodborne illness, also called food poisoning. Foodborne illness is caused by eating food that contains harmful bacteria, parasites, or viruses.  Foods to avoid Some foods have a higher risk of becoming tainted with bacteria. These include: Unwashed fresh fruit and vegetables, especially leafy vegetables that can hide dirt and other contaminants Raw sprouts, such as alfalfa sprouts Raw or undercooked beef, especially ground beef, or other raw or undercooked meat and poultry Fatty, fried, or spicy foods immediately before or after treatment.  These can sit heavy on your stomach and make you feel nauseous. Raw or undercooked shellfish, such as oysters. Sushi and sashimi, which often contain raw fish.  Unpasteurized beverages, such as unpasteurized fruit juices, raw milk, raw yogurt, or cider Undercooked eggs, such as soft boiled, over easy, and poached; raw, unpasteurized eggs; or foods made with raw egg, such as homemade raw cookie dough and homemade mayonnaise  Simple steps for food safety  Shop smart. Do not buy food stored or displayed in an unclean area. Do not buy bruised or damaged fruits or vegetables. Do not buy cans that have cracks, dents, or bulges. Pick up foods that can spoil at the end of your shopping trip and store them in a cooler on the way home.  Prepare and clean up foods carefully. Rinse all fresh fruits and vegetables under running water, and dry them with a clean towel or paper towel. Clean the top of cans before opening them. After preparing food, wash your hands for 20 seconds with hot water and soap. Pay special attention to areas between fingers and under nails. Clean your utensils and dishes with hot water and soap. Disinfect your kitchen and cutting boards using 1 teaspoon  of liquid, unscented bleach mixed into 1 quart of water.    Dispose of old food. Eat canned and packaged food before its expiration date (the "use by" or "best  before" date). Consume refrigerated leftovers within 3 to 4 days. After that time, throw out the food. Even if the food does not smell or look spoiled, it still may be unsafe. Some bacteria, such as Listeria, can grow even on foods stored in the refrigerator if they are kept for too long.  Take precautions when eating out. At restaurants, avoid buffets and salad bars where food sits out for a long time and comes in contact with many people. Food can become contaminated when someone with a virus, often a norovirus, or another "bug" handles it. Put any leftover food in a "to-go" container yourself, rather than having the server do it. And, refrigerate leftovers as soon as you get home. Choose restaurants that are clean and that are willing to prepare your food as you order it cooked.    SYMPTOMS TO REPORT AS SOON AS POSSIBLE AFTER TREATMENT:  FEVER GREATER THAN 100.4 F CHILLS WITH OR WITHOUT FEVER NAUSEA AND VOMITING THAT IS NOT CONTROLLED WITH YOUR NAUSEA MEDICATION UNUSUAL SHORTNESS OF BREATH UNUSUAL BRUISING OR BLEEDING TENDERNESS IN MOUTH AND THROAT WITH OR WITHOUT PRESENCE OF ULCERS URINARY PROBLEMS BOWEL PROBLEMS UNUSUAL RASH     Wear comfortable clothing and clothing appropriate for easy access to any Portacath or PICC line. Let us know if there is anything that we can do to make your therapy better!   What to do if you need assistance after hours or on the weekends: CALL [phone].  HOLD on the line, do not hang up.  You will hear multiple messages but at the end you will be connected with a nurse triage line.  They will contact the doctor if necessary.  Most of the time they will be able to assist you.  Do not call the hospital operator.    I have been informed and understand all of the instructions given to me and have received a copy. I have been instructed to call the clinic [phone] or my family physician as soon as possible for continued medical care, if indicated.  I do not have any more questions at this time but understand that I may call the Cancer Center or the Patient Navigator at [phone] during office hours should I have questions or need assistance in obtaining follow-up care.

## 2022-11-15 NOTE — Progress Notes (Signed)

## 2022-11-19 ENCOUNTER — Other Ambulatory Visit: Payer: Self-pay | Admitting: Radiology

## 2022-11-19 ENCOUNTER — Telehealth: Payer: Self-pay | Admitting: Dietician

## 2022-11-19 ENCOUNTER — Inpatient Hospital Stay: Payer: HMO | Admitting: Dietician

## 2022-11-19 NOTE — Telephone Encounter (Signed)
Nutrition Assessment  Reason for Assessment: [facility]   ASSESSMENT: 77 year old female with newly diagnosed stage IV HCC. Patient planning to start palliative chemoimmunotherapy with gemcitabine/cisplatin + imfinzi (start 11/13). Patient is under the care of Dr. Ellin Saba.  Past medical history includes atrial fibrillation, HTN, GERD, HLD  Spoke with patient via telephone. Introduced self and nutrition services available at [employer]. Patient appreciative of call and agreeable to visit. Patient reports  appetite is not the best recently, but she is trying. She does not really get hungry. Food does interest her. Often going most of the day without eating. Patient recalls fried egg and one piece of sausage and toast with honey this morning. She has not tried ONS in the past. Patient reports mild nausea the last 2 days. She feels this is r/t nerves about port placement and starting treatment. Patient denies vomiting, diarrhea. Patiens unsure if she is constipated. She is having gas, reports 2 bowel movements daily. States they have been more mushy the last couple weeks. "There is more on the toilet paper than in the pot." Patient is not on bowel regimen.   Nutrition Focused Physical Exam: unable to complete (telephone visit)  Medications: xanax, dulcolax, vasotec, pepcid, amaryl, lantus, metformin, oxycodone, protonix, mirlax, compazine, crestor, synthroid, B12, eliquis   Labs: 10/16 labs reviewed    Anthropometrics:   Height: 5'9" Weight: 181 lb 9.6 oz  UBW: 180 lb  BMI: 26.82   NUTRITION DIAGNOSIS: Food and nutrition related knowledge deficit related to newly diagnosed cancer as evidenced by no prior need for associated nutrition information   INTERVENTION:  Educated on importance of adequate calorie and protein energy intake to maintain strength/weights  Discussed strategies for poor appetite, suggested eating by the clock, encouraged small snacks q2-3h - will provide snack ideas Discussed  strategies for nausea, foods best tolerated and foods to limit/avoid Educated on nutrient profiles of ONS, suggested drinking Ensure Plus/equivalent daily for added calories and protein - will provide samples + coupons Reviewed upcoming appointments with pt per her request Consider daily bowel regimen Samples + handouts left with nursing to give pt on 11/13 - pt aware   MONITORING, EVALUATION, GOAL: Patient will tolerate adequate calories and protein to minimize wt loss during treatment   Next Visit: Monday November 18 via telephone

## 2022-11-19 NOTE — H&P (Signed)
Referring Physician(s): Katragadda,Sreedhar  Supervising Physician: Irish Lack  Patient Status:  [facility]  Chief Complaint: "I'm here for a port a cath"   Subjective: Patient known to IR service from left lobe liver lesion biopsy on 10/26/2022. She is a 77 year old female ex-smoker with past medical history significant for anemia, anxiety, diabetes, hiatal hernia, hypertension, hypothyroidism and newly diagnosed metastatic intrahepatic cholangiocarcinoma.  She presents today for Port-A-Cath placement to assist with treatment.   Past Medical History:  Diagnosis Date   Anemia    Anxiety    Diabetes mellitus    H/O hiatal hernia    Hypertension    Hypothyroidism    Past Surgical History:  Procedure Laterality Date   ABDOMINAL HYSTERECTOMY     CATARACT EXTRACTION W/PHACO Left 06/16/2013   Procedure: CATARACT EXTRACTION PHACO AND INTRAOCULAR LENS PLACEMENT (IOC);  Surgeon: [name] T. Nile Riggs, MD;  Location: [facility];  Service: Ophthalmology;  Laterality: Left;  CDE:4.65   COLONOSCOPY N/A 10/01/2014   Procedure: COLONOSCOPY;  Surgeon: Malissa Hippo, MD;  Location: [facility] SUITE;  Service: Endoscopy;  Laterality: N/A;  1125   TONSILLECTOMY     TUBAL LIGATION        Allergies: Tape  Medications: Prior to Admission medications   Medication Sig Start Date End Date Taking? Authorizing Provider  acetaminophen (TYLENOL) 500 MG tablet Take 500 mg by mouth every 6 (six) hours as needed for moderate pain (pain score 4-6).    [provider]  ALPRAZolam Prudy Feeler) 0.5 MG tablet Take 0.5 mg by mouth at bedtime as needed for anxiety.    [provider]  apixaban (ELIQUIS) 5 MG TABS tablet Take 1 tablet (5 mg total) by mouth 2 (two) times daily. 10/27/22   Kathlen Mody, MD  bisacodyl (DULCOLAX) 10 MG suppository Place 1 suppository (10 mg total) rectally daily as needed for moderate constipation. 10/27/22   Kathlen Mody, MD  cyanocobalamin (,VITAMIN B-12,) 1000 MCG/ML  injection Inject 1,000 mcg into the muscle every 30 (thirty) days.    [provider]  enalapril (VASOTEC) 5 MG tablet Take 5 mg by mouth daily.    [provider]  famotidine (PEPCID) 20 MG tablet Take 20 mg by mouth 2 (two) times daily as needed. 10/17/22   [provider]  fenofibrate micronized (LOFIBRA) 134 MG capsule Take 134 mg by mouth daily before breakfast.    [provider]  fexofenadine (ALLEGRA) 180 MG tablet Take 180 mg by mouth daily as needed for allergies or rhinitis.    [provider]  glimepiride (AMARYL) 4 MG tablet Take 4 mg by mouth 2 (two) times daily.    [provider]  insulin glargine (LANTUS) 100 UNIT/ML injection Inject 40 Units into the skin at bedtime as needed (high blood sugar).    [provider]  levothyroxine (SYNTHROID, LEVOTHROID) 137 MCG tablet Take 137 mcg by mouth daily before breakfast.    [provider]  lidocaine-prilocaine (EMLA) cream Apply to affected area once 11/15/22   Doreatha Massed, MD  metFORMIN (GLUCOPHAGE) 500 MG tablet Take 500 mg by mouth 2 (two) times daily with a meal.    [provider]  Omega-3 Fatty Acids (FISH OIL) 1200 MG CAPS Take 1 capsule by mouth daily.     [provider]  [facility] VERIO test strip 3 (three) times daily. 11/03/22   [provider]  oxyCODONE (ROXICODONE) 5 MG immediate release tablet Take 1 tablet (5 mg total) by mouth every  6 (six) hours as needed for severe pain (pain score 7-10). 11/13/22   Doreatha Massed, MD  pantoprazole (PROTONIX) 40 MG tablet Take 1 tablet (40 mg total) by mouth daily. 10/26/22   Kathlen Mody, MD  polyethylene glycol (MIRALAX / GLYCOLAX) 17 g packet Take 17 g by mouth daily. 10/26/22   Kathlen Mody, MD  prochlorperazine (COMPAZINE) 10 MG tablet Take 1 tablet (10 mg total) by mouth every 6 (six) hours as needed (Nausea or vomiting). 11/15/22   Doreatha Massed, MD  rosuvastatin  (CRESTOR) 10 MG tablet Take 10 mg by mouth once a week. 09/13/22   [provider]     Vital Signs:   Code Status:   Physical Exam  Imaging: No results found.  Labs:  CBC: Recent Labs    10/24/22 0425 10/25/22 0512 10/26/22 0505 10/27/22 0508  WBC 10.2 7.6 5.8 6.9  HGB 14.0 12.8 12.6 12.6  HCT 43.6 40.2 39.1 39.7  PLT 256 203 190 194    COAGS: Recent Labs    10/26/22 0918  INR 1.1    BMP: Recent Labs    10/22/22 1221 10/23/22 0647 10/24/22 0425  NA 134* 134* 137  K 4.3 3.5 3.5  CL 100 102 101  CO2 26 23 25   GLUCOSE 324* 213* 152*  BUN 16 13 13   CALCIUM 9.8 8.9 9.3  CREATININE 1.01* 0.72 0.98  GFRNONAA 57* >60 59*    LIVER FUNCTION TESTS: Recent Labs    10/22/22 1221 10/23/22 0647 10/24/22 0425  BILITOT 2.0* 1.2 1.2  AST 74* 67* 43*  ALT 35 48* 39  ALKPHOS 69 65 68  PROT 8.0 7.4 7.5  ALBUMIN 4.2 3.9 4.0    Assessment and Plan: 77 year old female ex-smoker with past medical history significant for anemia, anxiety, diabetes, hiatal hernia, hypertension, hypothyroidism and newly diagnosed metastatic intrahepatic cholangiocarcinoma.  She presents today for Port-A-Cath placement to assist with treatment.Risks and benefits of image guided port-a-catheter placement was discussed with the patient including, but not limited to bleeding, infection, pneumothorax, or fibrin sheath development and need for additional procedures.  All of the patient's questions were answered, patient is agreeable to proceed. Consent signed and in chart.    Electronically Signed: D. Jeananne Rama, PA-C 11/19/2022, 3:18 PM   I spent a total of 25 minutes at the the patient's bedside AND on the patient's hospital floor or unit, greater than 50% of which was counseling/coordinating care for port a  cath placement

## 2022-11-20 ENCOUNTER — Ambulatory Visit (HOSPITAL_COMMUNITY)
Admission: RE | Admit: 2022-11-20 | Discharge: 2022-11-20 | Disposition: A | Discharge status: Home / Self Care | Payer: HMO | Source: Ambulatory Visit | Attending: Hematology | Admitting: Hematology

## 2022-11-20 ENCOUNTER — Other Ambulatory Visit: Payer: Self-pay

## 2022-11-20 ENCOUNTER — Encounter (HOSPITAL_COMMUNITY): Payer: Self-pay

## 2022-11-20 ENCOUNTER — Encounter: Payer: Self-pay | Admitting: Hematology

## 2022-11-20 DIAGNOSIS — E039 Hypothyroidism, unspecified: Secondary | ICD-10-CM | POA: Insufficient documentation

## 2022-11-20 DIAGNOSIS — I4891 Unspecified atrial fibrillation: Secondary | ICD-10-CM | POA: Insufficient documentation

## 2022-11-20 DIAGNOSIS — Z7984 Long term (current) use of oral hypoglycemic drugs: Secondary | ICD-10-CM | POA: Insufficient documentation

## 2022-11-20 DIAGNOSIS — Z7901 Long term (current) use of anticoagulants: Secondary | ICD-10-CM | POA: Insufficient documentation

## 2022-11-20 DIAGNOSIS — Z87891 Personal history of nicotine dependence: Secondary | ICD-10-CM | POA: Insufficient documentation

## 2022-11-20 DIAGNOSIS — E119 Type 2 diabetes mellitus without complications: Secondary | ICD-10-CM | POA: Diagnosis not present

## 2022-11-20 DIAGNOSIS — C221 Intrahepatic bile duct carcinoma: Secondary | ICD-10-CM | POA: Insufficient documentation

## 2022-11-20 DIAGNOSIS — I10 Essential (primary) hypertension: Secondary | ICD-10-CM | POA: Diagnosis not present

## 2022-11-20 DIAGNOSIS — F419 Anxiety disorder, unspecified: Secondary | ICD-10-CM | POA: Insufficient documentation

## 2022-11-20 DIAGNOSIS — Z794 Long term (current) use of insulin: Secondary | ICD-10-CM | POA: Diagnosis not present

## 2022-11-20 HISTORY — PX: IR IMAGING GUIDED PORT INSERTION: IMG5740

## 2022-11-20 LAB — GLUCOSE, CAPILLARY: Glucose-Capillary: 90 mg/dL (ref 70–99)

## 2022-11-20 MED ORDER — FENTANYL CITRATE (PF) 100 MCG/2ML IJ SOLN
INTRAMUSCULAR | Status: AC | PRN
  Administered 2022-11-20: 50 ug via INTRAVENOUS

## 2022-11-20 MED ORDER — FENTANYL CITRATE (PF) 100 MCG/2ML IJ SOLN
INTRAMUSCULAR | Status: AC
  Filled 2022-11-20: qty 2

## 2022-11-20 MED ORDER — HEPARIN SOD (PORK) LOCK FLUSH 100 UNIT/ML IV SOLN
INTRAVENOUS | Status: AC
  Filled 2022-11-20: qty 5

## 2022-11-20 MED ORDER — MIDAZOLAM HCL 2 MG/2ML IJ SOLN
INTRAMUSCULAR | Status: AC | PRN
  Administered 2022-11-20: 1 mg via INTRAVENOUS

## 2022-11-20 MED ORDER — MIDAZOLAM HCL 2 MG/2ML IJ SOLN
INTRAMUSCULAR | Status: AC
  Filled 2022-11-20: qty 2

## 2022-11-20 MED ORDER — SODIUM CHLORIDE 0.9% FLUSH: 3.0000 mL | Freq: Two times a day (BID) | INTRAVENOUS | Status: DC

## 2022-11-20 MED ORDER — LIDOCAINE HCL 1 % IJ SOLN
20.0000 mL | Freq: Once | INTRAMUSCULAR | Status: AC
  Administered 2022-11-20: 14 mL via INTRADERMAL

## 2022-11-20 MED ORDER — HEPARIN SOD (PORK) LOCK FLUSH 100 UNIT/ML IV SOLN
500.0000 [IU] | Freq: Once | INTRAVENOUS | Status: AC
  Administered 2022-11-20: 500 [IU] via INTRAVENOUS

## 2022-11-20 MED ORDER — LIDOCAINE HCL 1 % IJ SOLN
INTRAMUSCULAR | Status: AC
  Filled 2022-11-20: qty 20

## 2022-11-20 MED ORDER — SODIUM CHLORIDE 0.9 % IV SOLN: INTRAVENOUS | Status: DC

## 2022-11-20 NOTE — Discharge Instructions (Signed)
For questions /concerns may call Interventional Radiology at [phone] or  Interventional Radiology clinic [phone]   You may remove your dressing and shower tomorrow afternoon  DO NOT use EMLA cream for 2 weeks after port placement as the cream will remove surgical glue on your incision.                                                                                                                                                                         Implanted Port Insertion, Care After The following information offers guidance on how to care for yourself after your procedure. Your health care provider may also give you more specific instructions. If you have problems or questions, contact your health care provider. What can I expect after the procedure? After the procedure, it is common to have: Discomfort at the port insertion site. Bruising on the skin over the port. This should improve over 3-4 days. Follow these instructions at home: [facility] care After your port is placed, you will get a manufacturer's information card. The card has information about your port. Keep this card with you at all times. Take care of the port as told by your health care provider. Ask your health care provider if you or a family member can get training for taking care of the port at home. A home health care nurse will be be available to help care for the port. Make sure to remember what type of port you have. Incision care  Follow instructions from your health care provider about how to take care of your port insertion site. Make sure you: Wash your hands with soap and water for at least 20 seconds before and after you change your bandage (dressing). If soap and water are not available, use hand sanitizer. Change your dressing as told by your health care provider. Leave stitches (sutures), skin glue, or adhesive strips in place. These skin closures may need to stay in place for 2 weeks or  longer. If adhesive strip edges start to loosen and curl up, you may trim the loose edges. Do not remove adhesive strips completely unless your health care provider tells you to do that. Check your port insertion site every day for signs of infection. Check for: Redness, swelling, or pain. Fluid or blood. Warmth. Pus or a bad smell. Activity Return to your normal activities as told by your health care provider. Ask your health care provider what activities are safe for you. You may have to avoid lifting. Ask your health care provider how much you can safely lift. General instructions Take over-the-counter and prescription medicines only as told by your health care provider. Do not take baths, swim, or use a hot tub until your health care  provider approves. Ask your health care provider if you may take showers. You may only be allowed to take sponge baths. If you were given a sedative during the procedure, it can affect you for several hours. Do not drive or operate machinery until your health care provider says that it is safe. Wear a medical alert bracelet in case of an emergency. This will tell any health care providers that you have a port. Keep all follow-up visits. This is important. Contact a health care provider if: You cannot flush your port with saline as directed, or you cannot draw blood from the port. You have a fever or chills. You have redness, swelling, or pain around your port insertion site. You have fluid or blood coming from your port insertion site. Your port insertion site feels warm to the touch. You have pus or a bad smell coming from the port insertion site. Get help right away if: You have chest pain or shortness of breath. You have bleeding from your port that you cannot control. These symptoms may be an emergency. Get help right away. Call 911. Do not wait to see if the symptoms will go away. Do not drive yourself to the hospital. Summary Take care of the port  as told by your health care provider. Keep the manufacturer's information card with you at all times. Change your dressing as told by your health care provider. Contact a health care provider if you have a fever or chills or if you have redness, swelling, or pain around your port insertion site. Keep all follow-up visits. This information is not intended to replace advice given to you by your health care provider. Make sure you discuss any questions you have with your health care provider. Document Revised: 06/28/2020 Document Reviewed: 06/28/2020 Elsevier Patient Education  2024 [employer]                                                                                             Moderate Conscious Sedation, Adult, Care After After the procedure, it is common to have: Sleepiness for a few hours. Impaired judgment for a few hours. Trouble with balance. Nausea or vomiting if you eat too soon. Follow these instructions at home: For the time period you were told by your health care provider:  Rest. Do not participate in activities where you could fall or become injured. Do not drive or use machinery. Do not drink alcohol. Do not take sleeping pills or medicines that cause drowsiness. Do not make important decisions or sign legal documents. Do not take care of children on your own. Eating and drinking Follow instructions from your health care provider about what you may eat and drink. Drink enough fluid to keep your urine pale yellow. If you vomit: Drink clear fluids slowly and in small amounts as you are able. Clear fluids include water, ice chips, low-calorie sports drinks, and fruit juice that has water added to it (diluted fruit juice). Eat light and bland foods in small amounts as you are able. These foods include bananas, applesauce, rice, lean meats, toast, and crackers. General instructions Take over-the-counter and  prescription medicines only as told by your health care  provider. Have a responsible adult stay with you for the time you are told. Do not use any products that contain nicotine or tobacco. These products include cigarettes, chewing tobacco, and vaping devices, such as e-cigarettes. If you need help quitting, ask your health care provider. Return to your normal activities as told by your health care provider. Ask your health care provider what activities are safe for you. Your health care provider may give you more instructions. Make sure you know what you can and cannot do. Contact a health care provider if: You are still sleepy or having trouble with balance after 24 hours. You feel light-headed. You vomit every time you eat or drink. You get a rash. You have a fever. You have redness or swelling around the IV site. Get help right away if: You have trouble breathing. You start to feel confused at home. These symptoms may be an emergency. Get help right away. Call 911. Do not wait to see if the symptoms will go away. Do not drive yourself to the hospital. This information is not intended to replace advice given to you by your health care provider. Make sure you discuss any questions you have with your health care provider. Document Revised: 07/10/2021 Document Reviewed: 07/10/2021 Elsevier Patient Education  2024 [employer].

## 2022-11-20 NOTE — Procedures (Signed)
Interventional Radiology Procedure Note  Procedure: Single Lumen Power Port Placement    Access:  Right IJ vein.  Findings: Catheter tip positioned at SVC/RA junction. Port is ready for immediate use.   Complications: None  EBL: < 10 mL  Recommendations:  - Ok to shower in 24 hours - Do not submerge for 7 days - Routine line care   Lashauna Arpin T. Jacobs Golab, M.D Pager:  [phone]   

## 2022-11-20 NOTE — Progress Notes (Signed)
[facility] [address], [place] [address]    Clinic Day:  11/22/2022  Referring physician: [name], MD  Patient Care Team: [name], MD as PCP - General (Internal Medicine)   ASSESSMENT & PLAN:   Assessment: 1.  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 - CT CAP (10/22/2022): Heterogeneous mass in the left hepatic lobe, mildly enlarged portacaval node and small lymph nodes in the anterior cardiophrenic fat.  Scattered lung nodules, largest 7 mm. - MRI/MRCP (10/24/2022): Dominant poorly marginated heterogeneous enhancing 4 x 4 x 3.4 cm liver mass centered in the central segment 4 of the left liver with marked intrahepatic biliary ductal dilatation in the left lower lobe.  Numerous similar enhancing satellite liver masses scattered predominantly in the left lower lobe with some involvement of segment 8 right liver and caudate lobe compatible with widespread liver metastasis.  Mildly enlarged porta hepatic lymph node. - Tumor markers: CEA: 39, CA 19-9: 381, AFP: 3.1. - Liver mass biopsy (10/26/2022): Invasive moderately differentiated adenocarcinoma, strongly positive for CK7, focal positivity for CDX2, negative for CK20, negative for hepatic marker arginase.  Site of histomorphology and IHC pattern compatible with pancreaticobiliary origin including cholangiocarcinoma. - PET scan (11/08/2022): Wedge-shaped hypermetabolic mass in the central left hepatic lobe involving central and left hepatic lobe of the liver.  Pleural-based metastasis in the right upper lobe invading the right fourth rib.  Hypermetabolic skeletal metastasis to the T9 vertebral body.  Focal hypermetabolic activity in the muscle posterior to the left humeral head concerning for solitary muscle metastasis. - NGS:  - Gemcitabine, cisplatin and durvalumab  started on 11/21/22   2.  Social/family history: - She lives by herself at home and independent of ADLs and IADLs.  Smoked 1 pack/day for 30 years and quit in the late 1990s. - Brother had multiple myeloma.  Mother had bone cancer and father had brain cancer.    Plan: 1.  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  We discussed side effects in detail. - Reviewed labs today: Normal LFTs and creatinine.  CBC normal.  TSH is 1.9. - She has port placed.  She will proceed with cycle 1 day 1 today with 20% dose reduction for the first cycle.  She will receive pre and post hydration. - RTC 4 weeks for follow-up prior to cycle 2.   2.  Right posterior chest wall pain: - Pain is predominantly on the right back, worse on lying down. - Continue oxycodone twice daily and Tylenol every 4 hours in between.  3.  Hypomagnesemia: - She quit taking magnesium once daily at home on 11/13/2022.  Magnesium is low at 1.4. - Will start her on magnesium 400 mg twice daily.  She will receive IV magnesium.    Orders Placed This Encounter  Procedures   Magnesium    Standing Status:   Future    Number of Occurrences:   1    Standing Expiration Date:   11/21/2023   CBC with Differential    Standing Status:   Future    Number of Occurrences:   1    Standing Expiration Date:   11/21/2023   Comprehensive metabolic panel    Standing Status:   Future    Number of Occurrences:  1    Standing Expiration Date:   11/21/2023   T4    Standing Status:   Future    Number of Occurrences:   1    Standing Expiration Date:   11/21/2023   TSH    Standing Status:   Future    Number of Occurrences:   1    Standing Expiration Date:   11/21/2023   Magnesium    Standing Status:   Future    Standing Expiration Date:   12/04/2023   CBC with Differential    Standing Status:   Future     Standing Expiration Date:   12/04/2023   Comprehensive metabolic panel    Standing Status:   Future    Standing Expiration Date:   12/04/2023   T4    Standing Status:   Future    Standing Expiration Date:   12/04/2023   TSH    Standing Status:   Future    Standing Expiration Date:   12/04/2023   Magnesium    Standing Status:   Future    Standing Expiration Date:   12/19/2023   CBC with Differential    Standing Status:   Future    Standing Expiration Date:   12/19/2023   Comprehensive metabolic panel    Standing Status:   Future    Standing Expiration Date:   12/19/2023   Magnesium    Standing Status:   Future    Standing Expiration Date:   01/03/2024   CBC with Differential    Standing Status:   Future    Standing Expiration Date:   01/03/2024   Comprehensive metabolic panel    Standing Status:   Future    Standing Expiration Date:   01/03/2024   T4    Standing Status:   Future    Standing Expiration Date:   01/03/2024   TSH    Standing Status:   Future    Standing Expiration Date:   01/03/2024   Magnesium    Standing Status:   Future    Standing Expiration Date:   01/17/2024   CBC with Differential    Standing Status:   Future    Standing Expiration Date:   01/17/2024   Comprehensive metabolic panel    Standing Status:   Future    Standing Expiration Date:   01/17/2024   T4    Standing Status:   Future    Standing Expiration Date:   01/17/2024   TSH    Standing Status:   Future    Standing Expiration Date:   01/17/2024   Magnesium    Standing Status:   Future    Standing Expiration Date:   01/31/2024   CBC with Differential    Standing Status:   Future    Standing Expiration Date:   01/31/2024   Comprehensive metabolic panel    Standing Status:   Future    Standing Expiration Date:   01/31/2024   T4    Standing Status:   Future    Standing Expiration Date:   01/31/2024   TSH    Standing Status:   Future    Standing Expiration Date:   01/31/2024      I,Katie  Daubenspeck,acting as a scribe for Doreatha Massed, MD.,have documented all relevant documentation on the behalf of Doreatha Massed, MD,as directed by  Doreatha Massed, MD while in the presence of Doreatha Massed, MD.   I, Doreatha Massed MD, have reviewed the above documentation  for accuracy and completeness, and I agree with the above.   Doreatha Massed, MD   11/14/20247:54 AM  CHIEF COMPLAINT:   Diagnosis: metastatic intrahepatic cholangiocarcinoma   Cancer Staging  Cholangiocarcinoma [facility]) Staging form: Intrahepatic Bile Duct, AJCC 8th Edition - Clinical stage from 11/13/2022: Stage IV (cT2, cN0, cM1) - Signed by Doreatha Massed, MD on 11/13/2022    Prior Therapy: none  Current Therapy:  Gemcitabine, cisplatin and durvalumab    HISTORY OF PRESENT ILLNESS:   Oncology History  Cholangiocarcinoma (HCC)  11/13/2022 Initial Diagnosis   Cholangiocarcinoma (HCC)   11/13/2022 Cancer Staging   Staging form: Intrahepatic Bile Duct, AJCC 8th Edition - Clinical stage from 11/13/2022: Stage IV (cT2, cN0, cM1) - Signed by Doreatha Massed, MD on 11/13/2022 Histopathologic type: Cholangiocarcinoma Stage prefix: Initial diagnosis Histologic grade (G): G2 Histologic grading system: 3 grade system   11/21/2022 -  Chemotherapy   Patient is on Treatment Plan : BILIARY TRACT Cisplatin + Gemcitabine D1,8 + Durvalumab (1500) D1 q21d / Durvalumab (1500) q28d        INTERVAL HISTORY:   Melaina is a 77 y.o. female presenting to clinic today for follow up of metastatic intrahepatic cholangiocarcinoma. She was last seen by me on 11/13/22 in consultation.  Since her last visit, she had port placed yesterday, 11/20/22.  Today, she states that she is doing well overall. Her appetite level is at 90%. Her energy level is at 80%.  PAST MEDICAL HISTORY:   Past Medical History: Past Medical History:  Diagnosis Date   Anemia    Anxiety    Diabetes mellitus    H/O  hiatal hernia    Hypertension    Hypothyroidism     Surgical History: Past Surgical History:  Procedure Laterality Date   ABDOMINAL HYSTERECTOMY     CATARACT EXTRACTION W/PHACO Left 06/16/2013   Procedure: CATARACT EXTRACTION PHACO AND INTRAOCULAR LENS PLACEMENT (IOC);  Surgeon: [name] T. Nile Riggs, MD;  Location: [facility] ORS;  Service: Ophthalmology;  Laterality: Left;  CDE:4.65   COLONOSCOPY N/A 10/01/2014   Procedure: COLONOSCOPY;  Surgeon: Malissa Hippo, MD;  Location: [facility] SUITE;  Service: Endoscopy;  Laterality: N/A;  1125   IR IMAGING GUIDED PORT INSERTION  11/20/2022   TONSILLECTOMY     TUBAL LIGATION      Social History: Social History   Socioeconomic History   Marital status: Divorced    Spouse name: Not on file   Number of children: Not on file   Years of education: Not on file   Highest education level: Not on file  Occupational History   Not on file  Tobacco Use   Smoking status: Former    Current packs/day: 0.00    Average packs/day: 1 pack/day for 30.0 years (30.0 ttl pk-yrs)    Types: Cigarettes    Start date: 06/11/1958    Quit date: 06/10/1988    Years since quitting: 34.4   Smokeless tobacco: Never  Substance and Sexual Activity   Alcohol use: No   Drug use: No   Sexual activity: Yes    Birth control/protection: Surgical  Other Topics Concern   Not on file  Social History Narrative   Not on file   Social Determinants of Health   Financial Resource Strain: Not on file  Food Insecurity: No Food Insecurity (10/23/2022)   Hunger Vital Sign    Worried About Running Out of Food in the Last Year: Never true    Ran Out of Food in the Last Year:  Never true  Transportation Needs: No Transportation Needs (10/23/2022)   PRAPARE - Administrator, Civil Service (Medical): No    Lack of Transportation (Non-Medical): No  Physical Activity: Not on file  Stress: Not on file  Social Connections: Not on file  Intimate Partner Violence: Not At Risk  (10/23/2022)   Humiliation, Afraid, Rape, and Kick questionnaire    Fear of Current or Ex-Partner: No    Emotionally Abused: No    Physically Abused: No    Sexually Abused: No    Family History: Family History  Problem Relation Age of Onset   Brain cancer Father    Crohn's disease Sister    Coronary artery disease Sister    Multiple myeloma Brother    Diabetes Brother    Hypertension Brother    Rheum arthritis Mother     Current Medications:  Current Outpatient Medications:    acetaminophen (TYLENOL) 500 MG tablet, Take 500 mg by mouth every 6 (six) hours as needed for moderate pain (pain score 4-6)., Disp: , Rfl:    ALPRAZolam (XANAX) 0.5 MG tablet, Take 0.5 mg by mouth at bedtime as needed for anxiety., Disp: , Rfl:    apixaban (ELIQUIS) 5 MG TABS tablet, Take 1 tablet (5 mg total) by mouth 2 (two) times daily., Disp: 60 tablet, Rfl: 3   bisacodyl (DULCOLAX) 10 MG suppository, Place 1 suppository (10 mg total) rectally daily as needed for moderate constipation., Disp: 12 suppository, Rfl: 0   cyanocobalamin (,VITAMIN B-12,) 1000 MCG/ML injection, Inject 1,000 mcg into the muscle every 30 (thirty) days., Disp: , Rfl:    enalapril (VASOTEC) 5 MG tablet, Take 5 mg by mouth daily., Disp: , Rfl:    famotidine (PEPCID) 20 MG tablet, Take 20 mg by mouth 2 (two) times daily as needed., Disp: , Rfl:    fenofibrate micronized (LOFIBRA) 134 MG capsule, Take 134 mg by mouth daily before breakfast., Disp: , Rfl:    fexofenadine (ALLEGRA) 180 MG tablet, Take 180 mg by mouth daily as needed for allergies or rhinitis., Disp: , Rfl:    glimepiride (AMARYL) 4 MG tablet, Take 4 mg by mouth 2 (two) times daily., Disp: , Rfl:    insulin glargine (LANTUS) 100 UNIT/ML injection, Inject 40 Units into the skin at bedtime as needed (high blood sugar)., Disp: , Rfl:    levothyroxine (SYNTHROID, LEVOTHROID) 137 MCG tablet, Take 137 mcg by mouth daily before breakfast., Disp: , Rfl:    lidocaine-prilocaine  (EMLA) cream, Apply to affected area once, Disp: 30 g, Rfl: 3   magnesium oxide (MAG-OX) 400 (240 Mg) MG tablet, Take 1 tablet (400 mg total) by mouth 2 (two) times daily., Disp: 60 tablet, Rfl: 6   metFORMIN (GLUCOPHAGE) 500 MG tablet, Take 500 mg by mouth 2 (two) times daily with a meal., Disp: , Rfl:    Omega-3 Fatty Acids (FISH OIL) 1200 MG CAPS, Take 1 capsule by mouth daily. , Disp: , Rfl:    ONETOUCH VERIO test strip, 3 (three) times daily., Disp: , Rfl:    oxyCODONE (ROXICODONE) 5 MG immediate release tablet, Take 1 tablet (5 mg total) by mouth every 6 (six) hours as needed for severe pain (pain score 7-10)., Disp: 120 tablet, Rfl: 0   pantoprazole (PROTONIX) 40 MG tablet, Take 1 tablet (40 mg total) by mouth daily., Disp: 30 tablet, Rfl: 0   polyethylene glycol (MIRALAX / GLYCOLAX) 17 g packet, Take 17 g by mouth daily., Disp: 14 each, Rfl:  0   prochlorperazine (COMPAZINE) 10 MG tablet, Take 1 tablet (10 mg total) by mouth every 6 (six) hours as needed (Nausea or vomiting)., Disp: 60 tablet, Rfl: 3   rosuvastatin (CRESTOR) 10 MG tablet, Take 10 mg by mouth once a week., Disp: , Rfl:    Allergies: Allergies  Allergen Reactions   Tape Itching and Rash    REVIEW OF SYSTEMS:   Review of Systems  Constitutional:  Negative for chills, fatigue and fever.  HENT:   Negative for lump/mass, mouth sores, nosebleeds, sore throat and trouble swallowing.   Eyes:  Negative for eye problems.  Respiratory:  Negative for cough and shortness of breath.   Cardiovascular:  Negative for chest pain, leg swelling and palpitations.  Gastrointestinal:  Negative for abdominal pain, constipation, diarrhea, nausea and vomiting.  Genitourinary:  Negative for bladder incontinence, difficulty urinating, dysuria, frequency, hematuria and nocturia.   Musculoskeletal:  Positive for back pain. Negative for arthralgias, flank pain, myalgias and neck pain.  Skin:  Negative for itching and rash.  Neurological:   Negative for dizziness, headaches and numbness.  Hematological:  Does not bruise/bleed easily.  Psychiatric/Behavioral:  Negative for depression, sleep disturbance and suicidal ideas. The patient is not nervous/anxious.   All other systems reviewed and are negative.    VITALS:   There were no vitals taken for this visit.  Wt Readings from Last 3 Encounters:  11/21/22 181 lb 6.4 oz (82.3 kg)  11/20/22 175 lb (79.4 kg)  11/13/22 181 lb 9.6 oz (82.4 kg)    There is no height or weight on file to calculate BMI.  Performance status (ECOG): 1 - Symptomatic but completely ambulatory  PHYSICAL EXAM:   Physical Exam Vitals and nursing note reviewed. Exam conducted with a chaperone present.  Constitutional:      Appearance: Normal appearance.  Cardiovascular:     Rate and Rhythm: Normal rate and regular rhythm.     Pulses: Normal pulses.     Heart sounds: Normal heart sounds.  Pulmonary:     Effort: Pulmonary effort is normal.     Breath sounds: Normal breath sounds.  Abdominal:     Palpations: Abdomen is soft. There is no hepatomegaly, splenomegaly or mass.     Tenderness: There is no abdominal tenderness.  Musculoskeletal:     Right lower leg: No edema.     Left lower leg: No edema.  Lymphadenopathy:     Cervical: No cervical adenopathy.     Right cervical: No superficial, deep or posterior cervical adenopathy.    Left cervical: No superficial, deep or posterior cervical adenopathy.     Upper Body:     Right upper body: No supraclavicular or axillary adenopathy.     Left upper body: No supraclavicular or axillary adenopathy.  Neurological:     General: No focal deficit present.     Mental Status: She is alert and oriented to person, place, and time.  Psychiatric:        Mood and Affect: Mood normal.        Behavior: Behavior normal.     LABS:      Latest Ref Rng & Units 11/21/2022    8:09 AM 10/27/2022    5:08 AM 10/26/2022    5:05 AM  CBC  WBC 4.0 - 10.5 K/uL 9.9   6.9  5.8   Hemoglobin 12.0 - 15.0 g/dL 86.5  78.4  69.6   Hematocrit 36.0 - 46.0 % 39.3  39.7  39.1  Platelets 150 - 400 K/uL 273  194  190       Latest Ref Rng & Units 11/21/2022    8:09 AM 10/24/2022    4:25 AM 10/23/2022    6:47 AM  CMP  Glucose 70 - 99 mg/dL 191  478  295   BUN 8 - 23 mg/dL 16  13  13    Creatinine 0.44 - 1.00 mg/dL 6.21  3.08  6.57   Sodium 135 - 145 mmol/L 136  137  134   Potassium 3.5 - 5.1 mmol/L 3.7  3.5  3.5   Chloride 98 - 111 mmol/L 104  101  102   CO2 22 - 32 mmol/L 22  25  23    Calcium 8.9 - 10.3 mg/dL 9.3  9.3  8.9   Total Protein 6.5 - 8.1 g/dL 7.0  7.5  7.4   Total Bilirubin <1.2 mg/dL 1.0  1.2  1.2   Alkaline Phos 38 - 126 U/L 63  68  65   AST 15 - 41 U/L 27  43  67   ALT 0 - 44 U/L 12  39  48      Lab Results  Component Value Date   CEA1 39.0 (H) 10/23/2022   /  CEA  Date Value Ref Range Status  10/23/2022 39.0 (H) 0.0 - 4.7 ng/mL Final    Comment:    (NOTE)                             Nonsmokers          <3.9                             Smokers             <5.6 [employer] Electrochemiluminescence Immunoassay (ECLIA) Values obtained with different assay methods or kits cannot be used interchangeably.  Results cannot be interpreted as absolute evidence of the presence or absence of malignant disease. Performed At: [facility] [address] [place], [place] [id number] Jolene Schimke MD WU:[id number]    No results found for: "PSA1" Lab Results  Component Value Date   CAN199 381 (H) 10/23/2022   No results found for: "CAN125"  No results found for: "TOTALPROTELP", "ALBUMINELP", "A1GS", "A2GS", "BETS", "BETA2SER", "GAMS", "MSPIKE", "SPEI" No results found for: "TIBC", "FERRITIN", "IRONPCTSAT" No results found for: "LDH"   STUDIES:   IR IMAGING GUIDED PORT INSERTION  Result Date: 11/20/2022 CLINICAL DATA:  Cholangiocarcinoma and need for porta cath to begin chemotherapy. EXAM: IMPLANTED PORT A CATH  PLACEMENT WITH ULTRASOUND AND FLUOROSCOPIC GUIDANCE ANESTHESIA/SEDATION: Moderate (conscious) sedation was employed during this procedure. A total of Versed 1.0 mg and Fentanyl 50 mcg was administered intravenously by radiology nursing. Moderate Sedation Time: 31 minutes. The patient's level of consciousness and vital signs were monitored continuously by radiology nursing throughout the procedure under my direct supervision. FLUOROSCOPY: 33 seconds.  3.0 mGy. PROCEDURE: The procedure, risks, benefits, and alternatives were explained to the patient. Questions regarding the procedure were encouraged and answered. The patient understands and consents to the procedure. A time-out was performed prior to initiating the procedure. Ultrasound was utilized to confirm patency of the right internal jugular vein. An ultrasound image was saved and recorded. The right neck and chest were prepped with chlorhexidine in a sterile fashion, and a sterile drape was applied covering the operative field. Maximum barrier  sterile technique with sterile gowns and gloves were used for the procedure. Local anesthesia was provided with 1% lidocaine. After creating a small venotomy incision, a 21 gauge needle was advanced into the right internal jugular vein under direct, real-time ultrasound guidance. Ultrasound image documentation was performed. After securing guidewire access, an 8 Fr dilator was placed. A J-wire was kinked to measure appropriate catheter length. A subcutaneous port pocket was then created along the upper chest wall utilizing sharp and blunt dissection. Portable cautery was utilized. The pocket was irrigated with sterile saline. A single lumen power injectable port was chosen for placement. The 8 Fr catheter was tunneled from the port pocket site to the venotomy incision. The port was placed in the pocket. External catheter was trimmed to appropriate length based on guidewire measurement. At the venotomy, an 8 Fr peel-away  sheath was placed over a guidewire. The catheter was then placed through the sheath and the sheath removed. Final catheter positioning was confirmed and documented with a fluoroscopic spot image. The port was accessed with a needle and aspirated and flushed with heparinized saline. The access needle was removed. The venotomy and port pocket incisions were closed with subcutaneous 3-0 Monocryl and subcuticular 4-0 Vicryl. Dermabond was applied to both incisions. COMPLICATIONS: COMPLICATIONS None FINDINGS: After catheter placement, the tip lies at the cavo-atrial junction. The catheter aspirates normally and is ready for immediate use. IMPRESSION: Placement of single lumen port a cath via right internal jugular vein. The catheter tip lies at the cavo-atrial junction. A power injectable port a cath was placed and is ready for immediate use. Electronically Signed   By: Irish Lack M.D.   On: 11/20/2022 15:21   NM PET Image Initial (PI) Skull Base To Thigh  Result Date: 11/13/2022 CLINICAL DATA:  Initial encounter. treatment strategy for cholangiocarcinoma. EXAM: NUCLEAR MEDICINE PET SKULL BASE TO THIGH TECHNIQUE: 8.8 mCi F-18 FDG was injected intravenously. Full-ring PET imaging was performed from the skull base to thigh after the radiotracer. CT data was obtained and used for attenuation correction and anatomic localization. Fasting blood glucose: 200 mg/dl COMPARISON:  MRI 40/98/1191 FINDINGS: Mediastinal blood pool activity: SUV max 2.3 Liver activity: SUV max NA NECK: No hypermetabolic lymph nodes in the neck. Incidental CT findings: None. CHEST: Pleural base mass in the medial RIGHT upper lobe measures 2.5 x 1.6 cm and has intense metabolic activity with SUV max equal 9.3. This mass erodes into the adjacent rib RIGHT sixth rib. Three 66 is (image 50/series 202) if. A second the thoracic lesion involves the the RIGHT transverse process of the T9 vertebral body with SUV max equal 8.7. Focal uptake in the  musculature deep to the LEFT humeral head with SUV max equal 4.4 on image 43. No CT correlation. Incidental CT findings: None. ABDOMEN/PELVIS: Patchy hypermetabolic activity associated with the wedge shaped hypodense lesion in the central LEFT hepatic lobe with SUV max equal 5.5 on image 84. There is hypermetabolic activity on either side of the falciform ligament (image 87). Additionally there is intense metabolic activity in the medial aspect of the LEFT lateral hepatic lobe SUV max equal 5.8 on image 89. No abnormal activity within the RIGHT hepatic lobe. No hypermetabolic upper abdominal adenopathy Incidental CT findings: Postcholecystectomy SKELETON: Thoracic skeletal metastasis described in the chest section. Incidental CT findings: None. IMPRESSION: 1. Wedge-shaped hypermetabolic mass in the central LEFT hepatic lobe consistent with biopsy-proven cholangiocarcinoma. Carcinoma involves the central and LEFT LEFT hepatic lobe of the liver. 2. Pleural base  metastasis in the RIGHT upper lobe invades the RIGHT fourth rib. 3. Hypermetabolic skeletal metastasis to the T9 vertebral body. 4. Focal hypermetabolic activity in the muscle posterior to the LEFT humeral head is concerning for solitary muscle metastasis. Electronically Signed   By: Genevive Bi M.D.   On: 11/13/2022 10:50   US BIOPSY (LIVER)  Result Date: 10/26/2022 INDICATION: No known primary, now with infiltrative mass involving the majority of the medial segment of the left lobe of the liver worrisome cholangiocarcinoma. Please perform ultrasound-guided biopsy for tissue diagnostic purposes. EXAM: ULTRASOUND GUIDED LIVER LESION BIOPSY COMPARISON:  Abdominal MRI-10/24/2022; CT of the chest, abdomen and pelvis-10/22/2022 MEDICATIONS: None ANESTHESIA/SEDATION: Moderate (conscious) sedation was employed during this procedure as administered by the Interventional Radiology RN. A total of Versed 0.5 mg and Fentanyl 25 mcg was administered  intravenously. Moderate Sedation Time: 11 minutes. The patient's level of consciousness and vital signs were monitored continuously by radiology nursing throughout the procedure under my direct supervision. COMPLICATIONS: None immediate. PROCEDURE: Informed written consent was obtained from the patient after a discussion of the risks, benefits and alternatives to treatment. The patient understands and consents the procedure. A timeout was performed prior to the initiation of the procedure. Ultrasound scanning was performed of the right upper abdominal quadrant demonstrates an ill-defined hypoechoic mass involving the medial segment of the left lobe of the liver (image 4) corresponding with the ill-defined lesion seen on preceding abdominal MRI and CT. The procedure was planned. The right upper abdominal quadrant was prepped and draped in the usual sterile fashion. The overlying soft tissues were anesthetized with 1% lidocaine with epinephrine. A 17 gauge, 6.8 cm co-axial needle was advanced into a peripheral aspect of the lesion. This was followed by 6 core biopsies with an 18 gauge core device under direct ultrasound guidance. The coaxial needle tract was embolized with a small amount of Gel-Foam slurry and superficial hemostasis was obtained with manual compression. Post procedural scanning was negative for definitive area of hemorrhage or additional complication. A dressing was placed. The patient tolerated the procedure well without immediate post procedural complication. IMPRESSION: Technically successful ultrasound guided core needle biopsy of indeterminate infiltrative lesion involving the majority of the medial segment of the left lobe of the liver. Electronically Signed   By: Simonne Come M.D.   On: 10/26/2022 14:39   US Venous Img Lower Bilateral (DVT)  Result Date: 10/25/2022 CLINICAL DATA:  Bilateral lower extremity edema and calf pain. EXAM: BILATERAL LOWER EXTREMITY VENOUS DOPPLER ULTRASOUND  TECHNIQUE: Gray-scale sonography with graded compression, as well as color Doppler and duplex ultrasound were performed to evaluate the lower extremity deep venous systems from the level of the common femoral vein and including the common femoral, femoral, profunda femoral, popliteal and calf veins including the posterior tibial, peroneal and gastrocnemius veins when visible. The superficial great saphenous vein was also interrogated. Spectral Doppler was utilized to evaluate flow at rest and with distal augmentation maneuvers in the common femoral, femoral and popliteal veins. COMPARISON:  None Available. FINDINGS: RIGHT LOWER EXTREMITY Common Femoral Vein: No evidence of thrombus. Normal compressibility, respiratory phasicity and response to augmentation. Saphenofemoral Junction: No evidence of thrombus. Normal compressibility and flow on color Doppler imaging. Profunda Femoral Vein: No evidence of thrombus. Normal compressibility and flow on color Doppler imaging. Femoral Vein: No evidence of thrombus. Normal compressibility, respiratory phasicity and response to augmentation. Popliteal Vein: No evidence of thrombus. Normal compressibility, respiratory phasicity and response to augmentation. Calf Veins: No evidence of  thrombus. Normal compressibility and flow on color Doppler imaging. Superficial Great Saphenous Vein: No evidence of thrombus. Normal compressibility. Venous Reflux:  None. Other Findings: No evidence of superficial thrombophlebitis or abnormal fluid collection. LEFT LOWER EXTREMITY Common Femoral Vein: No evidence of thrombus. Normal compressibility, respiratory phasicity and response to augmentation. Saphenofemoral Junction: No evidence of thrombus. Normal compressibility and flow on color Doppler imaging. Profunda Femoral Vein: No evidence of thrombus. Normal compressibility and flow on color Doppler imaging. Femoral Vein: No evidence of thrombus. Normal compressibility, respiratory phasicity and  response to augmentation. Popliteal Vein: No evidence of thrombus. Normal compressibility, respiratory phasicity and response to augmentation. Calf Veins: No evidence of thrombus. Normal compressibility and flow on color Doppler imaging. Superficial Great Saphenous Vein: No evidence of thrombus. Normal compressibility. Venous Reflux:  None. Other Findings: No evidence of superficial thrombophlebitis or abnormal fluid collection. IMPRESSION: No evidence of deep venous thrombosis in either lower extremity. Electronically Signed   By: Irish Lack M.D.   On: 10/25/2022 13:33   MR ABDOMEN MRCP W WO CONTAST  Result Date: 10/25/2022 CLINICAL DATA:  Inpatient. Possible liver mass on CT angiogram performed for right upper quadrant abdominal pain. EXAM: MRI ABDOMEN WITHOUT AND WITH CONTRAST (INCLUDING MRCP) TECHNIQUE: Multiplanar multisequence MR imaging of the abdomen was performed both before and after the administration of intravenous contrast. Heavily T2-weighted images of the biliary and pancreatic ducts were obtained, and three-dimensional MRCP images were rendered by post processing. CONTRAST:  7mL GADAVIST GADOBUTROL 1 MMOL/ML IV SOLN COMPARISON:  10/22/2022 CT angiogram of the chest, abdomen and pelvis. FINDINGS: Lower chest: No acute abnormality at the lung bases. Hepatobiliary: There is a dominant poorly marginated heterogeneously enhancing 4.4 x 3.4 cm liver mass centered in central segment 4 left liver (series 24/image 38), with associated marked intrahepatic biliary ductal dilatation throughout the left liver lobe with relatively nondilated right liver lobe intrahepatic bile ducts. There are numerous similar clustered satellite liver lesions with targetoid enhancement widely scattered throughout the entire left liver lobe and with involvement of portions of the segment 8 right liver lobe and caudate lobe as best seen on diffusion-weighted sequence (series 9/images 11 and 14). Representative 2.1 x 1.3 cm  posterior segment 3 left liver mass (series 17/image 46) and representative 1.2 x 1.1 cm liver mass along the borders of segments 4A and 8 (series 17/image 35). No hepatic steatosis. Cholecystectomy. Top-normal caliber common bile duct with diameter 6 mm. No choledocholithiasis. Pancreas: No pancreatic mass or duct dilation.  No pancreas divisum. Spleen: Normal size. No mass. Adrenals/Urinary Tract: Normal adrenals. No hydronephrosis. Subcentimeter simple bilateral renal cortical cysts, for which no follow-up imaging is recommended. No suspicious renal masses. No hydronephrosis. Stomach/Bowel: Normal non-distended stomach. Visualized small and large bowel is normal caliber, with no bowel wall thickening. Vascular/Lymphatic: Atherosclerotic nonaneurysmal abdominal aorta. Patent portal, splenic, hepatic and renal veins. Mildly enlarged 1.0 cm porta hepatis node (series 19/image 51). No additional pathologically enlarged lymph nodes in the abdomen. Other: No abdominal ascites or focal fluid collection. Musculoskeletal: Indeterminate focus of enhancement in the right T9 posterior elements on coronal postcontrast sequence (series 23/image 67). IMPRESSION: 1. Dominant poorly marginated heterogeneously enhancing 4.4 x 3.4 cm liver mass centered in the central segment 4 left liver with marked intrahepatic biliary ductal dilatation in the left liver lobe. 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 2. Numerous similar enhancing satellite liver masses scattered predominantly in the left liver lobe with some involvement of the segment 8 right liver and caudate  lobe, compatible with widespread liver metastases. 3. Mildly enlarged porta hepatis lymph node, indeterminate, potentially metastatic. 4. Indeterminate focus of enhancement in the right T9 posterior elements, incompletely characterized on this MRI abdomen study, cannot exclude bone metastasis. A dedicated thoracic spine MRI without and with IV  contrast is recommended for further evaluation. Alternatively, PET-CT could be obtained for staging evaluation. Electronically Signed   By: Delbert Phenix M.D.   On: 10/25/2022 09:54   MR 3D Recon At Scanner  Result Date: 10/25/2022 CLINICAL DATA:  Inpatient. Possible liver mass on CT angiogram performed for right upper quadrant abdominal pain. EXAM: MRI ABDOMEN WITHOUT AND WITH CONTRAST (INCLUDING MRCP) TECHNIQUE: Multiplanar multisequence MR imaging of the abdomen was performed both before and after the administration of intravenous contrast. Heavily T2-weighted images of the biliary and pancreatic ducts were obtained, and three-dimensional MRCP images were rendered by post processing. CONTRAST:  7mL GADAVIST GADOBUTROL 1 MMOL/ML IV SOLN COMPARISON:  10/22/2022 CT angiogram of the chest, abdomen and pelvis. FINDINGS: Lower chest: No acute abnormality at the lung bases. Hepatobiliary: There is a dominant poorly marginated heterogeneously enhancing 4.4 x 3.4 cm liver mass centered in central segment 4 left liver (series 24/image 38), with associated marked intrahepatic biliary ductal dilatation throughout the left liver lobe with relatively nondilated right liver lobe intrahepatic bile ducts. There are numerous similar clustered satellite liver lesions with targetoid enhancement widely scattered throughout the entire left liver lobe and with involvement of portions of the segment 8 right liver lobe and caudate lobe as best seen on diffusion-weighted sequence (series 9/images 11 and 14). Representative 2.1 x 1.3 cm posterior segment 3 left liver mass (series 17/image 46) and representative 1.2 x 1.1 cm liver mass along the borders of segments 4A and 8 (series 17/image 35). No hepatic steatosis. Cholecystectomy. Top-normal caliber common bile duct with diameter 6 mm. No choledocholithiasis. Pancreas: No pancreatic mass or duct dilation.  No pancreas divisum. Spleen: Normal size. No mass. Adrenals/Urinary Tract:  Normal adrenals. No hydronephrosis. Subcentimeter simple bilateral renal cortical cysts, for which no follow-up imaging is recommended. No suspicious renal masses. No hydronephrosis. Stomach/Bowel: Normal non-distended stomach. Visualized small and large bowel is normal caliber, with no bowel wall thickening. Vascular/Lymphatic: Atherosclerotic nonaneurysmal abdominal aorta. Patent portal, splenic, hepatic and renal veins. Mildly enlarged 1.0 cm porta hepatis node (series 19/image 51). No additional pathologically enlarged lymph nodes in the abdomen. Other: No abdominal ascites or focal fluid collection. Musculoskeletal: Indeterminate focus of enhancement in the right T9 posterior elements on coronal postcontrast sequence (series 23/image 67). IMPRESSION: 1. Dominant poorly marginated heterogeneously enhancing 4.4 x 3.4 cm liver mass centered in the central segment 4 left liver with marked intrahepatic biliary ductal dilatation in the left liver lobe. 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 2. Numerous similar enhancing satellite liver masses scattered predominantly in the left liver lobe with some involvement of the segment 8 right liver and caudate lobe, compatible with widespread liver metastases. 3. Mildly enlarged porta hepatis lymph node, indeterminate, potentially metastatic. 4. Indeterminate focus of enhancement in the right T9 posterior elements, incompletely characterized on this MRI abdomen study, cannot exclude bone metastasis. A dedicated thoracic spine MRI without and with IV contrast is recommended for further evaluation. Alternatively, PET-CT could be obtained for staging evaluation. Electronically Signed   By: Delbert Phenix M.D.   On: 10/25/2022 09:54   ECHOCARDIOGRAM COMPLETE  Result Date: 10/23/2022    ECHOCARDIOGRAM REPORT   Patient Name:   CARRINE ENDRESS Date of  Exam: 10/23/2022 Medical Rec #:  [id number]          Height:       69.0 in Accession #:    [id number]          Weight:       181.0 lb Date of Birth:  [birth year]          BSA:          1.980 m Patient Age:    77 years           BP:           195/93 mmHg Patient Gender: F                  HR:           45 bpm. [facility] Procedure: 2D Echo, Cardiac Doppler and Color Doppler Indications:    Atrial Fibrillation  History:        Patient has no prior history of Echocardiogram examinations.                 Arrythmias:Atrial Fibrillation; Risk Factors:Hypertension,                 Diabetes and Dyslipidemia.  Sonographer:    Mikki Harbor Referring Phys: [id number] ASIA B ZIERLE-GHOSH  Sonographer Comments: Image acquisition challenging due to respiratory motion. IMPRESSIONS  1. Left ventricular ejection fraction, by estimation, is 60 to 65%. The left ventricle has normal function. The left ventricle has no regional wall motion abnormalities. There is mild left ventricular hypertrophy. Left ventricular diastolic parameters are indeterminate.  2. Right ventricular systolic function is normal. The right ventricular size is normal. There is mildly elevated pulmonary artery systolic pressure.  3. Left atrial size was mildly dilated.  4. The mitral valve is abnormal. Mild mitral valve regurgitation. No evidence of mitral stenosis.  5. The tricuspid valve is abnormal. Tricuspid valve regurgitation is mild to moderate.  6. The aortic valve is tricuspid. There is mild calcification of the aortic valve. There is mild thickening of the aortic valve. Aortic valve regurgitation is not visualized. No aortic stenosis is present.  7. The inferior vena cava is normal in size with greater than 50% respiratory variability, suggesting right atrial pressure of 3 mmHg. FINDINGS  Left Ventricle: Left ventricular ejection fraction, by estimation, is 60 to 65%. The left ventricle has normal function. The left ventricle has no regional wall motion abnormalities. The left ventricular internal cavity size was normal in size. There is   mild left ventricular hypertrophy. Left ventricular diastolic parameters are indeterminate. Right Ventricle: The right ventricular size is normal. Right vetricular wall thickness was not well visualized. Right ventricular systolic function is normal. There is mildly elevated pulmonary artery systolic pressure. The tricuspid regurgitant velocity  is 3.02 m/s, and with an assumed right atrial pressure of 8 mmHg, the estimated right ventricular systolic pressure is 44.5 mmHg. Left Atrium: Left atrial size was mildly dilated. Right Atrium: Right atrial size was normal in size. Pericardium: There is no evidence of pericardial effusion. Mitral Valve: The mitral valve is abnormal. Mild mitral valve regurgitation. No evidence of mitral valve stenosis. MV peak gradient, 3.8 mmHg. The mean mitral valve gradient is 1.0 mmHg. Tricuspid Valve: The tricuspid valve is abnormal. Tricuspid valve regurgitation is mild to moderate. No evidence of tricuspid stenosis. Aortic Valve: The aortic valve is tricuspid. There is mild calcification of the aortic valve. There is mild thickening of the aortic valve.  There is mild aortic valve annular calcification. Aortic valve regurgitation is not visualized. No aortic stenosis  is present. Aortic valve mean gradient measures 4.5 mmHg. Aortic valve peak gradient measures 10.4 mmHg. Aortic valve area, by VTI measures 2.10 cm. Pulmonic Valve: The pulmonic valve was not well visualized. Pulmonic valve regurgitation is not visualized. No evidence of pulmonic stenosis. Aorta: The aortic root is normal in size and structure. Venous: The inferior vena cava is normal in size with greater than 50% respiratory variability, suggesting right atrial pressure of 3 mmHg. IAS/Shunts: No atrial level shunt detected by color flow Doppler.  LEFT VENTRICLE PLAX 2D LVIDd:         5.10 cm   Diastology LVIDs:         3.70 cm   LV e' medial:    7.40 cm/s LV PW:         1.10 cm   LV E/e' medial:  11.0 LV IVS:         1.10 cm   LV e' lateral:   13.20 cm/s LVOT diam:     1.90 cm   LV E/e' lateral: 6.2 LV SV:         81 LV SV Index:   41 LVOT Area:     2.84 cm  RIGHT VENTRICLE RV Basal diam:  3.95 cm RV Mid diam:    3.40 cm RV S prime:     10.00 cm/s LEFT ATRIUM           Index        RIGHT ATRIUM           Index LA diam:      4.10 cm 2.07 cm/m   RA Area:     20.70 cm LA Vol (A2C): 87.2 ml 44.04 ml/m  RA Volume:   63.20 ml  31.92 ml/m LA Vol (A4C): 74.7 ml 37.72 ml/m  AORTIC VALVE                    PULMONIC VALVE AV Area (Vmax):    2.10 cm     PV Vmax:       0.68 m/s AV Area (Vmean):   2.14 cm     PV Peak grad:  1.8 mmHg AV Area (VTI):     2.10 cm AV Vmax:           161.00 cm/s AV Vmean:          99.650 cm/s AV VTI:            0.386 m AV Peak Grad:      10.4 mmHg AV Mean Grad:      4.5 mmHg LVOT Vmax:         119.00 cm/s LVOT Vmean:        75.200 cm/s LVOT VTI:          0.285 m LVOT/AV VTI ratio: 0.74  AORTA Ao Root diam: 3.10 cm MITRAL VALVE               TRICUSPID VALVE MV Area (PHT): 3.06 cm    TR Peak grad:   36.5 mmHg MV Area VTI:   2.56 cm    TR Vmax:        302.00 cm/s MV Peak grad:  3.8 mmHg MV Mean grad:  1.0 mmHg    SHUNTS MV Vmax:       0.98 m/s    Systemic VTI:  0.29 m MV Vmean:  32.1 cm/s   Systemic Diam: 1.90 cm MV Decel Time: 248 msec MV E velocity: 81.40 cm/s Dina Rich MD Electronically signed by Dina Rich MD Signature Date/Time: 10/23/2022/12:40:48 PM    Final

## 2022-11-20 NOTE — Progress Notes (Signed)
1545 Ice bag given to use prn for comfort to right upper neck and right upper chest.

## 2022-11-21 ENCOUNTER — Inpatient Hospital Stay: Payer: HMO

## 2022-11-21 ENCOUNTER — Inpatient Hospital Stay (HOSPITAL_BASED_OUTPATIENT_CLINIC_OR_DEPARTMENT_OTHER): Payer: HMO | Admitting: Hematology

## 2022-11-21 VITALS — BP 162/78 | HR 66 | Temp 97.0°F | Resp 20

## 2022-11-21 DIAGNOSIS — C221 Intrahepatic bile duct carcinoma: Secondary | ICD-10-CM

## 2022-11-21 DIAGNOSIS — Z5112 Encounter for antineoplastic immunotherapy: Secondary | ICD-10-CM | POA: Diagnosis not present

## 2022-11-21 LAB — CBC WITH DIFFERENTIAL/PLATELET
Abs Immature Granulocytes: 0.03 10*3/uL (ref 0.00–0.07)
Basophils Absolute: 0 10*3/uL (ref 0.0–0.1)
Basophils Relative: 0 %
Eosinophils Absolute: 0.2 10*3/uL (ref 0.0–0.5)
Eosinophils Relative: 2 %
HCT: 39.3 % (ref 36.0–46.0)
Hemoglobin: 12.9 g/dL (ref 12.0–15.0)
Immature Granulocytes: 0 %
Lymphocytes Relative: 15 %
Lymphs Abs: 1.5 10*3/uL (ref 0.7–4.0)
MCH: 28.4 pg (ref 26.0–34.0)
MCHC: 32.8 g/dL (ref 30.0–36.0)
MCV: 86.6 fL (ref 80.0–100.0)
Monocytes Absolute: 0.8 10*3/uL (ref 0.1–1.0)
Monocytes Relative: 8 %
Neutro Abs: 7.4 10*3/uL (ref 1.7–7.7)
Neutrophils Relative %: 75 %
Platelets: 273 10*3/uL (ref 150–400)
RBC: 4.54 MIL/uL (ref 3.87–5.11)
RDW: 14.2 % (ref 11.5–15.5)
WBC: 9.9 10*3/uL (ref 4.0–10.5)
nRBC: 0 % (ref 0.0–0.2)

## 2022-11-21 LAB — COMPREHENSIVE METABOLIC PANEL
ALT: 12 U/L (ref 0–44)
AST: 27 U/L (ref 15–41)
Albumin: 3.7 g/dL (ref 3.5–5.0)
Alkaline Phosphatase: 63 U/L (ref 38–126)
Anion gap: 10 (ref 5–15)
BUN: 16 mg/dL (ref 8–23)
CO2: 22 mmol/L (ref 22–32)
Calcium: 9.3 mg/dL (ref 8.9–10.3)
Chloride: 104 mmol/L (ref 98–111)
Creatinine, Ser: 0.94 mg/dL (ref 0.44–1.00)
GFR, Estimated: >60 mL/min (ref >60)
Glucose, Bld: 181 mg/dL — ABNORMAL HIGH (ref 70–99)
Potassium: 3.7 mmol/L (ref 3.5–5.1)
Sodium: 136 mmol/L (ref 135–145)
Total Bilirubin: 1 mg/dL (ref <1.2)
Total Protein: 7 g/dL (ref 6.5–8.1)

## 2022-11-21 LAB — TSH: TSH: 1.91 u[IU]/mL (ref 0.350–4.500)

## 2022-11-21 LAB — MAGNESIUM: Magnesium: 1.4 mg/dL — ABNORMAL LOW (ref 1.7–2.4)

## 2022-11-21 MED ORDER — PALONOSETRON HCL INJECTION 0.25 MG/5ML
0.2500 mg | Freq: Once | INTRAVENOUS | Status: AC
  Administered 2022-11-21: 0.25 mg via INTRAVENOUS
  Filled 2022-11-21: qty 5

## 2022-11-21 MED ORDER — SODIUM CHLORIDE 0.9 % IV SOLN
20.0000 mg/m2 | Freq: Once | INTRAVENOUS | Status: AC
  Administered 2022-11-21: 40 mg via INTRAVENOUS
  Filled 2022-11-21: qty 40

## 2022-11-21 MED ORDER — SODIUM CHLORIDE 0.9 % IV SOLN
10.0000 mg | Freq: Once | INTRAVENOUS | Status: DC
Start: 2022-11-21 — End: 2022-11-21

## 2022-11-21 MED ORDER — SODIUM CHLORIDE 0.9 % IV SOLN
800.0000 mg/m2 | Freq: Once | INTRAVENOUS | Status: AC
  Administered 2022-11-21: 1596 mg via INTRAVENOUS
  Filled 2022-11-21: qty 41.98

## 2022-11-21 MED ORDER — SODIUM CHLORIDE 0.9 % IV SOLN: Freq: Once | INTRAVENOUS | Status: AC

## 2022-11-21 MED ORDER — MAGNESIUM SULFATE 2 GM/50ML IV SOLN
2.0000 g | Freq: Once | INTRAVENOUS | Status: DC
Start: 2022-11-21 — End: 2022-11-21

## 2022-11-21 MED ORDER — MAGNESIUM SULFATE 4 GM/100ML IV SOLN
4.0000 g | Freq: Once | INTRAVENOUS | Status: AC
  Administered 2022-11-21: 4 g via INTRAVENOUS
  Filled 2022-11-21: qty 100

## 2022-11-21 MED ORDER — DEXAMETHASONE SODIUM PHOSPHATE 10 MG/ML IJ SOLN
10.0000 mg | Freq: Once | INTRAMUSCULAR | Status: AC
Start: 2022-11-21 — End: 2022-11-21
  Administered 2022-11-21: 10 mg via INTRAVENOUS
  Filled 2022-11-21: qty 1

## 2022-11-21 MED ORDER — SODIUM CHLORIDE 0.9 % IV SOLN
1500.0000 mg | Freq: Once | INTRAVENOUS | Status: AC
  Administered 2022-11-21: 1500 mg via INTRAVENOUS
  Filled 2022-11-21: qty 30

## 2022-11-21 MED ORDER — SODIUM CHLORIDE 0.9% FLUSH
10.0000 mL | INTRAVENOUS | Status: DC | PRN
  Administered 2022-11-21: 10 mL

## 2022-11-21 MED ORDER — SODIUM CHLORIDE 0.9 % IV SOLN: INTRAVENOUS | Status: DC

## 2022-11-21 MED ORDER — SODIUM CHLORIDE 0.9% FLUSH
10.0000 mL | INTRAVENOUS | Status: AC
  Administered 2022-11-21: 10 mL

## 2022-11-21 MED ORDER — MAGNESIUM OXIDE -MG SUPPLEMENT 400 (240 MG) MG PO TABS: 400.0000 mg | ORAL_TABLET | Freq: Two times a day (BID) | ORAL | 6 refills | Status: AC

## 2022-11-21 MED ORDER — SODIUM CHLORIDE 0.9 % IV SOLN
150.0000 mg | Freq: Once | INTRAVENOUS | Status: AC
  Administered 2022-11-21: 150 mg via INTRAVENOUS
  Filled 2022-11-21: qty 150

## 2022-11-21 MED ORDER — POTASSIUM CHLORIDE IN NACL 20-0.9 MEQ/L-% IV SOLN
Freq: Once | INTRAVENOUS | Status: AC
  Filled 2022-11-21: qty 1000

## 2022-11-21 MED ORDER — HEPARIN SOD (PORK) LOCK FLUSH 100 UNIT/ML IV SOLN
500.0000 [IU] | Freq: Once | INTRAVENOUS | Status: AC | PRN
  Administered 2022-11-21: 500 [IU]

## 2022-11-21 NOTE — Patient Instructions (Addendum)
[facility] at [facility] Discharge Instructions   You were seen and examined today by Dr. Ellin Saba.  He reviewed the results of your lab work which are mostly normal/stable. Your magnesium is low today at 1.4. We will give you IV magnesium in the clinic today. We will also send a prescription to your pharmacy for magnesium tablets. Take one pill twice a day.   We will proceed with your treatment today.   Return as scheduled.    Thank you for choosing [facility] at [facility] to provide your oncology and hematology care.  To afford each patient quality time with our provider, please arrive at least 15 minutes before your scheduled appointment time.   If you have a lab appointment with the Cancer Center please come in thru the Main Entrance and check in at the main information desk.  You need to re-schedule your appointment should you arrive 10 or more minutes late.  We strive to give you quality time with our providers, and arriving late affects you and other patients whose appointments are after yours.  Also, if you no show three or more times for appointments you may be dismissed from the clinic at the providers discretion.     Again, thank you for choosing [facility].  Our hope is that these requests will decrease the amount of time that you wait before being seen by our physicians.       _____________________________________________________________  Should you have questions after your visit to [facility], please contact our office at [phone] and follow the prompts.  Our office hours are 8:00 a.m. and 4:30 p.m. Monday - Friday.  Please note that voicemails left after 4:00 p.m. may not be returned until the following business day.  We are closed weekends and major holidays.  You do have access to a nurse 24-7, just call the main number to the clinic [phone] and do not press any options, hold on the line  and a nurse will answer the phone.    For prescription refill requests, have your pharmacy contact our office and allow 72 hours.    Due to Covid, you will need to wear a mask upon entering the hospital. If you do not have a mask, a mask will be given to you at the Main Entrance upon arrival. For doctor visits, patients may have 1 support person age 9 or older with them. For treatment visits, patients can not have anyone with them due to social distancing guidelines and our immunocompromised population.

## 2022-11-21 NOTE — Patient Instructions (Signed)
[facility] - A DEPT OF [facility]  Discharge Instructions: Thank you for choosing [facility] to provide your oncology and hematology care.  If you have a lab appointment with the Cancer Center - please note that after April 8th, 2024, all labs will be drawn in the cancer center.  You do not have to check in or register with the main entrance as you have in the past but will complete your check-in in the cancer center.  Wear comfortable clothing and clothing appropriate for easy access to any Portacath or PICC line.   We strive to give you quality time with your provider. You may need to reschedule your appointment if you arrive late (15 or more minutes).  Arriving late affects you and other patients whose appointments are after yours.  Also, if you miss three or more appointments without notifying the office, you may be dismissed from the clinic at the provider's discretion.      For prescription refill requests, have your pharmacy contact our office and allow 72 hours for refills to be completed.    Today you received the following chemotherapy and/or immunotherapy agents cisplatin imfinzi gemzar      To help prevent nausea and vomiting after your treatment, we encourage you to take your nausea medication as directed.  BELOW ARE SYMPTOMS THAT SHOULD BE REPORTED IMMEDIATELY: *FEVER GREATER THAN 100.4 F (38 C) OR HIGHER *CHILLS OR SWEATING *NAUSEA AND VOMITING THAT IS NOT CONTROLLED WITH YOUR NAUSEA MEDICATION *UNUSUAL SHORTNESS OF BREATH *UNUSUAL BRUISING OR BLEEDING *URINARY PROBLEMS (pain or burning when urinating, or frequent urination) *BOWEL PROBLEMS (unusual diarrhea, constipation, pain near the anus) TENDERNESS IN MOUTH AND THROAT WITH OR WITHOUT PRESENCE OF ULCERS (sore throat, sores in mouth, or a toothache) UNUSUAL RASH, SWELLING OR PAIN  UNUSUAL VAGINAL DISCHARGE OR ITCHING   Items with * indicate a potential emergency and  should be followed up as soon as possible or go to the Emergency Department if any problems should occur.  Please show the CHEMOTHERAPY ALERT CARD or IMMUNOTHERAPY ALERT CARD at check-in to the Emergency Department and triage nurse.  Should you have questions after your visit or need to cancel or reschedule your appointment, please contact [facility] - A DEPT OF [facility] [phone]  and follow the prompts.  Office hours are 8:00 a.m. to 4:30 p.m. Monday - Friday. Please note that voicemails left after 4:00 p.m. may not be returned until the following business day.  We are closed weekends and major holidays. You have access to a nurse at all times for urgent questions. Please call the main number to the clinic [phone] and follow the prompts.  For any non-urgent questions, you may also contact your provider using MyChart. We now offer e-Visits for anyone 75 and older to request care online for non-urgent symptoms. For details visit [email or web address].   Also download the MyChart app! Go to the app store, search "MyChart", open the app, select [facility], and log in with your MyChart username and password.

## 2022-11-21 NOTE — Progress Notes (Signed)
Patient presents today for Cisplatin/imfinzi/gemzar infusions per providers order.  Labs and vital signs reviewed by the MD.  Message received from Chapman Moss RN/Dr. Ellin Saba patient okay for treatment.  Patient will also receive an additional 2 grams magnesium with treatment per MD.  Stable during infusion without adverse affects.  Vital signs stable.  No complaints at this time.  Discharge from clinic ambulatory in stable condition.  Alert and oriented X 3.  Follow up with [facility] as scheduled.

## 2022-11-21 NOTE — Progress Notes (Unsigned)
Patient has been examined by Dr. Katragadda. Vital signs and labs have been reviewed by MD - ANC, Creatinine, LFTs, hemoglobin, and platelets are within treatment parameters per M.D. - pt may proceed with treatment.  Primary RN and pharmacy notified.  

## 2022-11-21 NOTE — Progress Notes (Signed)
Pharmacist Chemotherapy Monitoring - Initial Assessment    Anticipated start date: 11/21/22   The following has been reviewed per standard work regarding the patient's treatment regimen: The patient's diagnosis, treatment plan and drug doses, and organ/hematologic function Lab orders and baseline tests specific to treatment regimen  The treatment plan start date, drug sequencing, and pre-medications Prior authorization status  Patient's documented medication list, including drug-drug interaction screen and prescriptions for anti-emetics and supportive care specific to the treatment regimen The drug concentrations, fluid compatibility, administration routes, and timing of the medications to be used The patient's access for treatment and lifetime cumulative dose history, if applicable  The patient's medication allergies and previous infusion related reactions, if applicable   Changes made to treatment plan:  N/A  Follow up needed:  N/A   Stephens Shire, [facility], 11/21/2022  9:04 AM

## 2022-11-22 ENCOUNTER — Other Ambulatory Visit: Payer: Self-pay

## 2022-11-22 ENCOUNTER — Ambulatory Visit: Payer: HMO

## 2022-11-22 ENCOUNTER — Other Ambulatory Visit: Payer: HMO

## 2022-11-22 ENCOUNTER — Encounter: Payer: Self-pay | Admitting: Hematology

## 2022-11-22 ENCOUNTER — Ambulatory Visit: Payer: HMO | Admitting: Hematology

## 2022-11-22 ENCOUNTER — Encounter (HOSPITAL_COMMUNITY): Payer: Self-pay

## 2022-11-22 ENCOUNTER — Inpatient Hospital Stay: Payer: HMO

## 2022-11-22 VITALS — BP 146/62 | HR 52 | Temp 98.3°F | Resp 19

## 2022-11-22 DIAGNOSIS — Z5112 Encounter for antineoplastic immunotherapy: Secondary | ICD-10-CM | POA: Diagnosis not present

## 2022-11-22 DIAGNOSIS — C801 Malignant (primary) neoplasm, unspecified: Secondary | ICD-10-CM | POA: Diagnosis not present

## 2022-11-22 DIAGNOSIS — C221 Intrahepatic bile duct carcinoma: Secondary | ICD-10-CM

## 2022-11-22 LAB — T4: T4, Total: 14.2 ug/dL — ABNORMAL HIGH (ref 4.5–12.0)

## 2022-11-22 MED ORDER — PEGFILGRASTIM-FPGK 6 MG/0.6ML ~~LOC~~ SOSY
6.0000 mg | PREFILLED_SYRINGE | Freq: Once | SUBCUTANEOUS | Status: AC
  Administered 2022-11-22: 6 mg via SUBCUTANEOUS
  Filled 2022-11-22: qty 0.6

## 2022-11-22 NOTE — Patient Instructions (Signed)
 [facility] - A DEPT OF [facility]  Discharge Instructions: Thank you for choosing [facility] to provide your oncology and hematology care.  If you have a lab appointment with the Cancer Center - please note that after April 8th, 2024, all labs will be drawn in the cancer center.  You do not have to check in or register with the main entrance as you have in the past but will complete your check-in in the cancer center.  Wear comfortable clothing and clothing appropriate for easy access to any Portacath or PICC line.   We strive to give you quality time with your provider. You may need to reschedule your appointment if you arrive late (15 or more minutes).  Arriving late affects you and other patients whose appointments are after yours.  Also, if you miss three or more appointments without notifying the office, you may be dismissed from the clinic at the provider's discretion.      For prescription refill requests, have your pharmacy contact our office and allow 72 hours for refills to be completed.    Today you received the following chemotherapy and/or immunotherapy agents Stimufend.  Pegfilgrastim Injection What is this medication? PEGFILGRASTIM (PEG fil gra stim) lowers the risk of infection in people who are receiving chemotherapy. It works by Systems analyst make more white blood cells, which protects your body from infection. It may also be used to help people who have been exposed to high doses of radiation. This medicine may be used for other purposes; ask your health care provider or pharmacist if you have questions. COMMON BRAND NAME(S): Cherly Hensen, Neulasta, Nyvepria, Stimufend, UDENYCA, UDENYCA ONBODY, Ziextenzo What should I tell my care team before I take this medication? They need to know if you have any of these conditions: Kidney disease Latex allergy Ongoing radiation therapy Sickle cell disease Skin reactions to  acrylic adhesives (On-Body Injector only) An unusual or allergic reaction to pegfilgrastim, filgrastim, other medications, foods, dyes, or preservatives Pregnant or trying to get pregnant Breast-feeding How should I use this medication? This medication is for injection under the skin. If you get this medication at home, you will be taught how to prepare and give the pre-filled syringe or how to use the On-body Injector. Refer to the patient Instructions for Use for detailed instructions. Use exactly as directed. Tell your care team immediately if you suspect that the On-body Injector may not have performed as intended or if you suspect the use of the On-body Injector resulted in a missed or partial dose. It is important that you put your used needles and syringes in a special sharps container. Do not put them in a trash can. If you do not have a sharps container, call your pharmacist or care team to get one. Talk to your care team about the use of this medication in children. While this medication may be prescribed for selected conditions, precautions do apply. Overdosage: If you think you have taken too much of this medicine contact a poison control center or emergency room at once. NOTE: This medicine is only for you. Do not share this medicine with others. What if I miss a dose? It is important not to miss your dose. Call your care team if you miss your dose. If you miss a dose due to an On-body Injector failure or leakage, a new dose should be administered as soon as possible using a single prefilled syringe for manual  use. What may interact with this medication? Interactions have not been studied. This list may not describe all possible interactions. Give your health care provider a list of all the medicines, herbs, non-prescription drugs, or dietary supplements you use. Also tell them if you smoke, drink alcohol, or use illegal drugs. Some items may interact with your medicine. What should I  watch for while using this medication? Your condition will be monitored carefully while you are receiving this medication. You may need blood work done while you are taking this medication. Talk to your care team about your risk of cancer. You may be more at risk for certain types of cancer if you take this medication. If you are going to need a MRI, CT scan, or other procedure, tell your care team that you are using this medication (On-Body Injector only). What side effects may I notice from receiving this medication? Side effects that you should report to your care team as soon as possible: Allergic reactions--skin rash, itching, hives, swelling of the face, lips, tongue, or throat Capillary leak syndrome--stomach or muscle pain, unusual weakness or fatigue, feeling faint or lightheaded, decrease in the amount of urine, swelling of the ankles, hands, or feet, trouble breathing High white blood cell level--fever, fatigue, trouble breathing, night sweats, change in vision, weight loss Inflammation of the aorta--fever, fatigue, back, chest, or stomach pain, severe headache Kidney injury (glomerulonephritis)--decrease in the amount of urine, red or dark brown urine, foamy or bubbly urine, swelling of the ankles, hands, or feet Shortness of breath or trouble breathing Spleen injury--pain in upper left stomach or shoulder Unusual bruising or bleeding Side effects that usually do not require medical attention (report to your care team if they continue or are bothersome): Bone pain Pain in the hands or feet This list may not describe all possible side effects. Call your doctor for medical advice about side effects. You may report side effects to FDA at 1-800-FDA-1088. Where should I keep my medication? Keep out of the reach of children. If you are using this medication at home, you will be instructed on how to store it. Throw away any unused medication after the expiration date on the label. NOTE:  This sheet is a summary. It may not cover all possible information. If you have questions about this medicine, talk to your doctor, pharmacist, or health care provider.  2024 Elsevier/Gold Standard (2020-11-25 00:00:00)     To help prevent nausea and vomiting after your treatment, we encourage you to take your nausea medication as directed.  BELOW ARE SYMPTOMS THAT SHOULD BE REPORTED IMMEDIATELY: *FEVER GREATER THAN 100.4 F (38 C) OR HIGHER *CHILLS OR SWEATING *NAUSEA AND VOMITING THAT IS NOT CONTROLLED WITH YOUR NAUSEA MEDICATION *UNUSUAL SHORTNESS OF BREATH *UNUSUAL BRUISING OR BLEEDING *URINARY PROBLEMS (pain or burning when urinating, or frequent urination) *BOWEL PROBLEMS (unusual diarrhea, constipation, pain near the anus) TENDERNESS IN MOUTH AND THROAT WITH OR WITHOUT PRESENCE OF ULCERS (sore throat, sores in mouth, or a toothache) UNUSUAL RASH, SWELLING OR PAIN  UNUSUAL VAGINAL DISCHARGE OR ITCHING   Items with * indicate a potential emergency and should be followed up as soon as possible or go to the Emergency Department if any problems should occur.  Please show the CHEMOTHERAPY ALERT CARD or IMMUNOTHERAPY ALERT CARD at check-in to the Emergency Department and triage nurse.  Should you have questions after your visit or need to cancel or reschedule your appointment, please contact [facility] - A [facility] OF [facility]  Rexene Edison [facility] [phone]  and follow the prompts.  Office hours are 8:00 a.m. to 4:30 p.m. Monday - Friday. Please note that voicemails left after 4:00 p.m. may not be returned until the following business day.  We are closed weekends and major holidays. You have access to a nurse at all times for urgent questions. Please call the main number to the clinic [phone] and follow the prompts.  For any non-urgent questions, you may also contact your provider using MyChart. We now offer e-Visits for anyone 38 and older to request care online for  non-urgent symptoms. For details visit [email or web address].   Also download the MyChart app! Go to the app store, search "MyChart", open the app, select [place], and log in with your MyChart username and password.

## 2022-11-22 NOTE — Progress Notes (Signed)
24 HOUR CHEMOTHERAPY TREATMENT FOLLOW UP:  Patient denies any side effects from her treatment.  Patient is feeling good.  Patient advised to contact the office with any questions or concerns.    Patient tolerated Stimufend injection with no complaints voiced.  Site clean and dry with no bruising or swelling noted.  No complaints of pain.  Discharged with vital signs stable and no signs or symptoms of distress noted.

## 2022-11-26 ENCOUNTER — Inpatient Hospital Stay: Payer: HMO | Admitting: Dietician

## 2022-11-26 DIAGNOSIS — C801 Malignant (primary) neoplasm, unspecified: Secondary | ICD-10-CM | POA: Diagnosis not present

## 2022-11-26 NOTE — Progress Notes (Signed)
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   Spoke with patient via telephone. She reports tolerating first treatment well. Patient reports she could tell when the steroids begin to wear off and found herself fatigued. Patient reports appetite has been okay. She has a difficult time deciding what she wants. Patient says she does better if food items are offered. She reports staying on top of medications has been challenging. Patient has made a list and checks medications off after taking. This has been helpful. Patient has mild constipation. She is unable to have a full bowel movement despite daily miralax. Patient asking if she can also take senna. Patient denies nausea, vomiting, diarrhea.   Medications: reviewed   Labs: glucose 181, Mg 1.4  Anthropometrics: Wt 181 lb 6.4 oz on 11/13 - stable   NUTRITION DIAGNOSIS: Food and nutrition related knowledge deficit continues    INTERVENTION:  Reviewed strategies for increasing calories and protein with small frequent meals/snacks Continue miralax + senna for constipation Pt did not receive handouts/samples at 11/13 appt - will mail handouts and provide ONS samples at next visit    MONITORING, EVALUATION, GOAL: wt trends, intake   NEXT VISIT: Monday December 16 via telephone

## 2022-11-28 ENCOUNTER — Other Ambulatory Visit: Payer: Self-pay | Admitting: *Deleted

## 2022-11-28 MED ORDER — TRIAMCINOLONE ACETONIDE 0.1 % EX CREA: 1.0000 | TOPICAL_CREAM | Freq: Two times a day (BID) | CUTANEOUS | 0 refills | Status: DC

## 2022-11-28 NOTE — Telephone Encounter (Signed)
Patient called with c/o new onset rash x 2 days.  Last treatment was 11/13.  Does not itch.  Please advise.

## 2022-11-29 DIAGNOSIS — C801 Malignant (primary) neoplasm, unspecified: Secondary | ICD-10-CM | POA: Diagnosis not present

## 2022-11-30 NOTE — Progress Notes (Signed)
Message sent to Cindie Crumbly, MD- Patient called concerning her rash. She states that the triamcinolone cream seemed to soothe it but now it is redder and itching. She wants to know if she can take benadryl and use cortisone 10% on it along with the triamcinolone cream.   Message received from Cindie Crumbly, MD- She can do a trial of Benadryl oral and then contact us on Monday if it does not go down.  Called patient and she is aware and agreeable with plan.

## 2022-12-03 ENCOUNTER — Encounter: Payer: Self-pay | Admitting: Hematology

## 2022-12-03 ENCOUNTER — Encounter (HOSPITAL_COMMUNITY): Payer: Self-pay

## 2022-12-04 ENCOUNTER — Inpatient Hospital Stay: Payer: HMO

## 2022-12-04 ENCOUNTER — Other Ambulatory Visit: Payer: Self-pay

## 2022-12-04 VITALS — BP 152/65 | HR 61 | Temp 97.6°F | Resp 18 | Wt 184.3 lb

## 2022-12-04 DIAGNOSIS — I6529 Occlusion and stenosis of unspecified carotid artery: Secondary | ICD-10-CM | POA: Diagnosis not present

## 2022-12-04 DIAGNOSIS — I610 Nontraumatic intracerebral hemorrhage in hemisphere, subcortical: Secondary | ICD-10-CM | POA: Diagnosis not present

## 2022-12-04 DIAGNOSIS — C221 Intrahepatic bile duct carcinoma: Secondary | ICD-10-CM

## 2022-12-04 DIAGNOSIS — I619 Nontraumatic intracerebral hemorrhage, unspecified: Secondary | ICD-10-CM | POA: Diagnosis not present

## 2022-12-04 DIAGNOSIS — R930 Abnormal findings on diagnostic imaging of skull and head, not elsewhere classified: Secondary | ICD-10-CM | POA: Diagnosis not present

## 2022-12-04 DIAGNOSIS — I10 Essential (primary) hypertension: Secondary | ICD-10-CM | POA: Diagnosis not present

## 2022-12-04 DIAGNOSIS — M4802 Spinal stenosis, cervical region: Secondary | ICD-10-CM | POA: Diagnosis not present

## 2022-12-04 DIAGNOSIS — R531 Weakness: Secondary | ICD-10-CM | POA: Diagnosis not present

## 2022-12-04 DIAGNOSIS — S199XXA Unspecified injury of neck, initial encounter: Secondary | ICD-10-CM | POA: Diagnosis not present

## 2022-12-04 DIAGNOSIS — I672 Cerebral atherosclerosis: Secondary | ICD-10-CM | POA: Diagnosis not present

## 2022-12-04 LAB — CBC WITH DIFFERENTIAL/PLATELET
Abs Immature Granulocytes: 0.22 10*3/uL — ABNORMAL HIGH (ref 0.00–0.07)
Basophils Absolute: 0.1 10*3/uL (ref 0.0–0.1)
Basophils Relative: 1 %
Eosinophils Absolute: 0.1 10*3/uL (ref 0.0–0.5)
Eosinophils Relative: 1 %
HCT: 36 % (ref 36.0–46.0)
Hemoglobin: 11.8 g/dL — ABNORMAL LOW (ref 12.0–15.0)
Immature Granulocytes: 2 %
Lymphocytes Relative: 10 %
Lymphs Abs: 1.2 10*3/uL (ref 0.7–4.0)
MCH: 28.9 pg (ref 26.0–34.0)
MCHC: 32.8 g/dL (ref 30.0–36.0)
MCV: 88 fL (ref 80.0–100.0)
Monocytes Absolute: 0.8 10*3/uL (ref 0.1–1.0)
Monocytes Relative: 7 %
Neutro Abs: 10 10*3/uL — ABNORMAL HIGH (ref 1.7–7.7)
Neutrophils Relative %: 79 %
Platelets: 267 10*3/uL (ref 150–400)
RBC: 4.09 MIL/uL (ref 3.87–5.11)
RDW: 15.4 % (ref 11.5–15.5)
WBC: 12.4 10*3/uL — ABNORMAL HIGH (ref 4.0–10.5)
nRBC: 0 % (ref 0.0–0.2)

## 2022-12-04 LAB — COMPREHENSIVE METABOLIC PANEL
ALT: 14 U/L (ref 0–44)
AST: 23 U/L (ref 15–41)
Albumin: 3.4 g/dL — ABNORMAL LOW (ref 3.5–5.0)
Alkaline Phosphatase: 114 U/L (ref 38–126)
Anion gap: 12 (ref 5–15)
BUN: 10 mg/dL (ref 8–23)
CO2: 21 mmol/L — ABNORMAL LOW (ref 22–32)
Calcium: 8.8 mg/dL — ABNORMAL LOW (ref 8.9–10.3)
Chloride: 102 mmol/L (ref 98–111)
Creatinine, Ser: 0.97 mg/dL (ref 0.44–1.00)
GFR, Estimated: 59 mL/min — ABNORMAL LOW (ref >60)
Glucose, Bld: 289 mg/dL — ABNORMAL HIGH (ref 70–99)
Potassium: 3.6 mmol/L (ref 3.5–5.1)
Sodium: 135 mmol/L (ref 135–145)
Total Bilirubin: 1.1 mg/dL (ref <1.2)
Total Protein: 6.9 g/dL (ref 6.5–8.1)

## 2022-12-04 LAB — TSH: TSH: 2.703 u[IU]/mL (ref 0.350–4.500)

## 2022-12-04 LAB — MAGNESIUM: Magnesium: 1.5 mg/dL — ABNORMAL LOW (ref 1.7–2.4)

## 2022-12-04 MED ORDER — SODIUM CHLORIDE 0.9 % IV SOLN
150.0000 mg | Freq: Once | INTRAVENOUS | Status: AC
  Administered 2022-12-04: 150 mg via INTRAVENOUS
  Filled 2022-12-04: qty 150

## 2022-12-04 MED ORDER — ACETAMINOPHEN 325 MG PO TABS
650.0000 mg | ORAL_TABLET | Freq: Once | ORAL | Status: AC
  Administered 2022-12-04: 650 mg via ORAL
  Filled 2022-12-04: qty 2

## 2022-12-04 MED ORDER — SODIUM CHLORIDE 0.9 % IV SOLN
800.0000 mg/m2 | Freq: Once | INTRAVENOUS | Status: AC
  Administered 2022-12-04: 1596 mg via INTRAVENOUS
  Filled 2022-12-04: qty 41.98

## 2022-12-04 MED ORDER — SODIUM CHLORIDE 0.9 % IV SOLN: Freq: Once | INTRAVENOUS | Status: AC

## 2022-12-04 MED ORDER — PALONOSETRON HCL INJECTION 0.25 MG/5ML
0.2500 mg | Freq: Once | INTRAVENOUS | Status: AC
  Administered 2022-12-04: 0.25 mg via INTRAVENOUS
  Filled 2022-12-04: qty 5

## 2022-12-04 MED ORDER — SODIUM CHLORIDE 0.9 % IV SOLN: 10.0000 mg | Freq: Once | INTRAVENOUS | Status: DC

## 2022-12-04 MED ORDER — SODIUM CHLORIDE 0.9 % IV SOLN: INTRAVENOUS | Status: DC

## 2022-12-04 MED ORDER — SODIUM CHLORIDE 0.9% FLUSH
10.0000 mL | INTRAVENOUS | Status: DC | PRN
  Administered 2022-12-04: 10 mL

## 2022-12-04 MED ORDER — MAGNESIUM SULFATE 2 GM/50ML IV SOLN
2.0000 g | Freq: Once | INTRAVENOUS | Status: AC
  Administered 2022-12-04: 2 g via INTRAVENOUS
  Filled 2022-12-04: qty 50

## 2022-12-04 MED ORDER — POTASSIUM CHLORIDE IN NACL 20-0.9 MEQ/L-% IV SOLN
Freq: Once | INTRAVENOUS | Status: AC
  Filled 2022-12-04: qty 1000

## 2022-12-04 MED ORDER — HEPARIN SOD (PORK) LOCK FLUSH 100 UNIT/ML IV SOLN
500.0000 [IU] | Freq: Once | INTRAVENOUS | Status: AC | PRN
Start: 2022-12-04 — End: 2022-12-04
  Administered 2022-12-04: 500 [IU]

## 2022-12-04 MED ORDER — SODIUM CHLORIDE 0.9 % IV SOLN
20.0000 mg/m2 | Freq: Once | INTRAVENOUS | Status: AC
  Administered 2022-12-04: 40 mg via INTRAVENOUS
  Filled 2022-12-04: qty 40

## 2022-12-04 MED ORDER — DEXAMETHASONE SODIUM PHOSPHATE 10 MG/ML IJ SOLN
10.0000 mg | Freq: Once | INTRAMUSCULAR | Status: AC
Start: 2022-12-04 — End: 2022-12-04
  Administered 2022-12-04: 10 mg via INTRAVENOUS
  Filled 2022-12-04: qty 1

## 2022-12-04 NOTE — Patient Instructions (Signed)
[facility] - A DEPT OF [facility]  Discharge Instructions: Thank you for choosing [facility] to provide your oncology and hematology care.  If you have a lab appointment with the Cancer Center - please note that after April 8th, 2024, all labs will be drawn in the cancer center.  You do not have to check in or register with the main entrance as you have in the past but will complete your check-in in the cancer center.  Wear comfortable clothing and clothing appropriate for easy access to any Portacath or PICC line.   We strive to give you quality time with your provider. You may need to reschedule your appointment if you arrive late (15 or more minutes).  Arriving late affects you and other patients whose appointments are after yours.  Also, if you miss three or more appointments without notifying the office, you may be dismissed from the clinic at the provider's discretion.      For prescription refill requests, have your pharmacy contact our office and allow 72 hours for refills to be completed.    Today you received the following chemotherapy and/or immunotherapy agents Gemzar and Cisplatin  To help prevent nausea and vomiting after your treatment, we encourage you to take your nausea medication as directed.  Gemcitabine Injection What is this medication? GEMCITABINE (jem SYE ta been) treats some types of cancer. It works by slowing down the growth of cancer cells. This medicine may be used for other purposes; ask your health care provider or pharmacist if you have questions. COMMON BRAND NAME(S): Gemzar, Infugem What should I tell my care team before I take this medication? They need to know if you have any of these conditions: Blood disorders Infection Kidney disease Liver disease Lung or breathing disease, such as asthma or COPD Recent or ongoing radiation therapy An unusual or allergic reaction to gemcitabine, other medications,  foods, dyes, or preservatives If you or your partner are pregnant or trying to get pregnant Breast-feeding How should I use this medication? This medication is injected into a vein. It is given by your care team in a hospital or clinic setting. Talk to your care team about the use of this medication in children. Special care may be needed. Overdosage: If you think you have taken too much of this medicine contact a poison control center or emergency room at once. NOTE: This medicine is only for you. Do not share this medicine with others. What if I miss a dose? Keep appointments for follow-up doses. It is important not to miss your dose. Call your care team if you are unable to keep an appointment. What may interact with this medication? Interactions have not been studied. This list may not describe all possible interactions. Give your health care provider a list of all the medicines, herbs, non-prescription drugs, or dietary supplements you use. Also tell them if you smoke, drink alcohol, or use illegal drugs. Some items may interact with your medicine. What should I watch for while using this medication? Your condition will be monitored carefully while you are receiving this medication. This medication may make you feel generally unwell. This is not uncommon, as chemotherapy can affect healthy cells as well as cancer cells. Report any side effects. Continue your course of treatment even though you feel ill unless your care team tells you to stop. In some cases, you may be given additional medications to help with side effects. Follow all directions  for their use. This medication may increase your risk of getting an infection. Call your care team for advice if you get a fever, chills, sore throat, or other symptoms of a cold or flu. Do not treat yourself. Try to avoid being around people who are sick. This medication may increase your risk to bruise or bleed. Call your care team if you notice any  unusual bleeding. Be careful brushing or flossing your teeth or using a toothpick because you may get an infection or bleed more easily. If you have any dental work done, tell your dentist you are receiving this medication. Avoid taking medications that contain aspirin, acetaminophen, ibuprofen, naproxen, or ketoprofen unless instructed by your care team. These medications may hide a fever. Talk to your care team if you or your partner wish to become pregnant or think you might be pregnant. This medication can cause serious birth defects if taken during pregnancy and for 6 months after the last dose. A negative pregnancy test is required before starting this medication. A reliable form of contraception is recommended while taking this medication and for 6 months after the last dose. Talk to your care team about effective forms of contraception. Do not father a child while taking this medication and for 3 months after the last dose. Use a condom while having sex during this time period. Do not breastfeed while taking this medication and for at least 1 week after the last dose. This medication may cause infertility. Talk to your care team if you are concerned about your fertility. What side effects may I notice from receiving this medication? Side effects that you should report to your care team as soon as possible: Allergic reactions--skin rash, itching, hives, swelling of the face, lips, tongue, or throat Capillary leak syndrome--stomach or muscle pain, unusual weakness or fatigue, feeling faint or lightheaded, decrease in the amount of urine, swelling of the ankles, hands, or feet, trouble breathing Infection--fever, chills, cough, sore throat, wounds that don't heal, pain or trouble when passing urine, general feeling of discomfort or being unwell Liver injury--right upper belly pain, loss of appetite, nausea, light-colored stool, dark yellow or brown urine, yellowing skin or eyes, unusual weakness or  fatigue Low red blood cell level--unusual weakness or fatigue, dizziness, headache, trouble breathing Lung injury--shortness of breath or trouble breathing, cough, spitting up blood, chest pain, fever Stomach pain, bloody diarrhea, pale skin, unusual weakness or fatigue, decrease in the amount of urine, which may be signs of hemolytic uremic syndrome Sudden and severe headache, confusion, change in vision, seizures, which may be signs of posterior reversible encephalopathy syndrome (PRES) Unusual bruising or bleeding Side effects that usually do not require medical attention (report to your care team if they continue or are bothersome): Diarrhea Drowsiness Hair loss Nausea Pain, redness, or swelling with sores inside the mouth or throat Vomiting This list may not describe all possible side effects. Call your doctor for medical advice about side effects. You may report side effects to FDA at 1-800-FDA-1088. Where should I keep my medication? This medication is given in a hospital or clinic. It will not be stored at home. NOTE: This sheet is a summary. It may not cover all possible information. If you have questions about this medicine, talk to your doctor, pharmacist, or health care provider.  2024 Elsevier/Gold Standard (2021-05-02 00:00:00)  Cisplatin Injection What is this medication? CISPLATIN (SIS pla tin) treats some types of cancer. It works by slowing down the growth of cancer cells.  This medicine may be used for other purposes; ask your health care provider or pharmacist if you have questions. COMMON BRAND NAME(S): Platinol, Platinol -AQ What should I tell my care team before I take this medication? They need to know if you have any of these conditions: Eye disease, vision problems Hearing problems Kidney disease Low blood counts, such as low white cells, platelets, or red blood cells Tingling of the fingers or toes, or other nerve disorder An unusual or allergic reaction to  cisplatin, carboplatin, oxaliplatin, other medications, foods, dyes, or preservatives If you or your partner are pregnant or trying to get pregnant Breast-feeding How should I use this medication? This medication is injected into a vein. It is given by your care team in a hospital or clinic setting. Talk to your care team about the use of this medication in children. Special care may be needed. Overdosage: If you think you have taken too much of this medicine contact a poison control center or emergency room at once. NOTE: This medicine is only for you. Do not share this medicine with others. What if I miss a dose? Keep appointments for follow-up doses. It is important not to miss your dose. Call your care team if you are unable to keep an appointment. What may interact with this medication? Do not take this medication with any of the following: Live virus vaccines This medication may also interact with the following: Certain antibiotics, such as amikacin, gentamicin, neomycin, polymyxin B, streptomycin, tobramycin, vancomycin Foscarnet This list may not describe all possible interactions. Give your health care provider a list of all the medicines, herbs, non-prescription drugs, or dietary supplements you use. Also tell them if you smoke, drink alcohol, or use illegal drugs. Some items may interact with your medicine. What should I watch for while using this medication? Your condition will be monitored carefully while you are receiving this medication. You may need blood work done while taking this medication. This medication may make you feel generally unwell. This is not uncommon, as chemotherapy can affect healthy cells as well as cancer cells. Report any side effects. Continue your course of treatment even though you feel ill unless your care team tells you to stop. This medication may increase your risk of getting an infection. Call your care team for advice if you get a fever, chills, sore  throat, or other symptoms of a cold or flu. Do not treat yourself. Try to avoid being around people who are sick. Avoid taking medications that contain aspirin, acetaminophen, ibuprofen, naproxen, or ketoprofen unless instructed by your care team. These medications may hide a fever. This medication may increase your risk to bruise or bleed. Call your care team if you notice any unusual bleeding. Be careful brushing or flossing your teeth or using a toothpick because you may get an infection or bleed more easily. If you have any dental work done, tell your dentist you are receiving this medication. Drink fluids as directed while you are taking this medication. This will help protect your kidneys. Call your care team if you get diarrhea. Do not treat yourself. Talk to your care team if you or your partner wish to become pregnant or think you might be pregnant. This medication can cause serious birth defects if taken during pregnancy and for 14 months after the last dose. A negative pregnancy test is required before starting this medication. A reliable form of contraception is recommended while taking this medication and for 14 months after the  last dose. Talk to your care team about effective forms of contraception. Do not father a child while taking this medication and for 11 months after the last dose. Use a condom during sex during this time period. Do not breast-feed while taking this medication. This medication may cause infertility. Talk to your care team if you are concerned about your fertility. What side effects may I notice from receiving this medication? Side effects that you should report to your care team as soon as possible: Allergic reactions--skin rash, itching, hives, swelling of the face, lips, tongue, or throat Eye pain, change in vision, vision loss Hearing loss, ringing in ears Infection--fever, chills, cough, sore throat, wounds that don't heal, pain or trouble when passing urine,  general feeling of discomfort or being unwell Kidney injury--decrease in the amount of urine, swelling of the ankles, hands, or feet Low red blood cell level--unusual weakness or fatigue, dizziness, headache, trouble breathing Painful swelling, warmth, or redness of the skin, blisters or sores at the infusion site Pain, tingling, or numbness in the hands or feet Unusual bruising or bleeding Side effects that usually do not require medical attention (report to your care team if they continue or are bothersome): Hair loss Nausea Vomiting This list may not describe all possible side effects. Call your doctor for medical advice about side effects. You may report side effects to FDA at 1-800-FDA-1088. Where should I keep my medication? This medication is given in a hospital or clinic. It will not be stored at home. NOTE: This sheet is a summary. It may not cover all possible information. If you have questions about this medicine, talk to your doctor, pharmacist, or health care provider.  2024 Elsevier/Gold Standard (2021-04-28 00:00:00)    BELOW ARE SYMPTOMS THAT SHOULD BE REPORTED IMMEDIATELY: *FEVER GREATER THAN 100.4 F (38 C) OR HIGHER *CHILLS OR SWEATING *NAUSEA AND VOMITING THAT IS NOT CONTROLLED WITH YOUR NAUSEA MEDICATION *UNUSUAL SHORTNESS OF BREATH *UNUSUAL BRUISING OR BLEEDING *URINARY PROBLEMS (pain or burning when urinating, or frequent urination) *BOWEL PROBLEMS (unusual diarrhea, constipation, pain near the anus) TENDERNESS IN MOUTH AND THROAT WITH OR WITHOUT PRESENCE OF ULCERS (sore throat, sores in mouth, or a toothache) UNUSUAL RASH, SWELLING OR PAIN  UNUSUAL VAGINAL DISCHARGE OR ITCHING   Items with * indicate a potential emergency and should be followed up as soon as possible or go to the Emergency Department if any problems should occur.  Please show the CHEMOTHERAPY ALERT CARD or IMMUNOTHERAPY ALERT CARD at check-in to the Emergency Department and triage  nurse.  Should you have questions after your visit or need to cancel or reschedule your appointment, please contact [facility] - A DEPT OF [facility] [phone]  and follow the prompts.  Office hours are 8:00 a.m. to 4:30 p.m. Monday - Friday. Please note that voicemails left after 4:00 p.m. may not be returned until the following business day.  We are closed weekends and major holidays. You have access to a nurse at all times for urgent questions. Please call the main number to the clinic [phone] and follow the prompts.  For any non-urgent questions, you may also contact your provider using MyChart. We now offer e-Visits for anyone 85 and older to request care online for non-urgent symptoms. For details visit [email or web address].   Also download the MyChart app! Go to the app store, search "MyChart", open the app, select [facility], and log in with your MyChart username and password.

## 2022-12-04 NOTE — Progress Notes (Signed)
Patient presents today for Gemzar/Cisplatin infusion.  Patient is in satisfactory condition with no new complaints voiced.  Vital signs are stable. Labs reviewed and all labs are within treatment parameters.  We will proceed with treatment per MD orders.    Patient urinated 250 mL prior to Cisplatin administration.  Treatment given today per MD orders. Tolerated infusion without adverse affects. Vital signs stable. No complaints at this time. Discharged from clinic ambulatory in stable condition. Alert and oriented x 3. F/U with [facility] as scheduled.

## 2022-12-05 ENCOUNTER — Encounter (HOSPITAL_COMMUNITY): Payer: Self-pay

## 2022-12-05 ENCOUNTER — Inpatient Hospital Stay (HOSPITAL_COMMUNITY)
Admission: EM | Admit: 2022-12-05 | Discharge: 2022-12-19 | DRG: 064 | Disposition: A | Discharge status: Skilled Nursing Facility | Payer: HMO | Attending: Family Medicine | Admitting: Family Medicine

## 2022-12-05 ENCOUNTER — Other Ambulatory Visit: Payer: Self-pay

## 2022-12-05 ENCOUNTER — Inpatient Hospital Stay: Payer: HMO

## 2022-12-05 VITALS — BP 144/64 | HR 52 | Temp 98.0°F | Resp 19

## 2022-12-05 DIAGNOSIS — Z8249 Family history of ischemic heart disease and other diseases of the circulatory system: Secondary | ICD-10-CM

## 2022-12-05 DIAGNOSIS — T458X5A Adverse effect of other primarily systemic and hematological agents, initial encounter: Secondary | ICD-10-CM | POA: Diagnosis present

## 2022-12-05 DIAGNOSIS — D72829 Elevated white blood cell count, unspecified: Secondary | ICD-10-CM | POA: Diagnosis present

## 2022-12-05 DIAGNOSIS — Z794 Long term (current) use of insulin: Secondary | ICD-10-CM

## 2022-12-05 DIAGNOSIS — I10 Essential (primary) hypertension: Secondary | ICD-10-CM | POA: Diagnosis not present

## 2022-12-05 DIAGNOSIS — Z66 Do not resuscitate: Secondary | ICD-10-CM | POA: Diagnosis not present

## 2022-12-05 DIAGNOSIS — E1165 Type 2 diabetes mellitus with hyperglycemia: Secondary | ICD-10-CM | POA: Diagnosis present

## 2022-12-05 DIAGNOSIS — R21 Rash and other nonspecific skin eruption: Secondary | ICD-10-CM | POA: Diagnosis not present

## 2022-12-05 DIAGNOSIS — W1830XA Fall on same level, unspecified, initial encounter: Secondary | ICD-10-CM | POA: Diagnosis present

## 2022-12-05 DIAGNOSIS — E785 Hyperlipidemia, unspecified: Secondary | ICD-10-CM | POA: Diagnosis present

## 2022-12-05 DIAGNOSIS — I619 Nontraumatic intracerebral hemorrhage, unspecified: Secondary | ICD-10-CM | POA: Diagnosis not present

## 2022-12-05 DIAGNOSIS — Z7984 Long term (current) use of oral hypoglycemic drugs: Secondary | ICD-10-CM

## 2022-12-05 DIAGNOSIS — M25552 Pain in left hip: Secondary | ICD-10-CM | POA: Diagnosis present

## 2022-12-05 DIAGNOSIS — G936 Cerebral edema: Secondary | ICD-10-CM | POA: Diagnosis present

## 2022-12-05 DIAGNOSIS — R4781 Slurred speech: Secondary | ICD-10-CM | POA: Diagnosis not present

## 2022-12-05 DIAGNOSIS — R1310 Dysphagia, unspecified: Secondary | ICD-10-CM | POA: Diagnosis present

## 2022-12-05 DIAGNOSIS — G4700 Insomnia, unspecified: Secondary | ICD-10-CM | POA: Diagnosis present

## 2022-12-05 DIAGNOSIS — Z833 Family history of diabetes mellitus: Secondary | ICD-10-CM

## 2022-12-05 DIAGNOSIS — C221 Intrahepatic bile duct carcinoma: Secondary | ICD-10-CM

## 2022-12-05 DIAGNOSIS — H538 Other visual disturbances: Secondary | ICD-10-CM | POA: Diagnosis present

## 2022-12-05 DIAGNOSIS — Z7901 Long term (current) use of anticoagulants: Secondary | ICD-10-CM

## 2022-12-05 DIAGNOSIS — I480 Paroxysmal atrial fibrillation: Secondary | ICD-10-CM | POA: Diagnosis present

## 2022-12-05 DIAGNOSIS — Z8261 Family history of arthritis: Secondary | ICD-10-CM

## 2022-12-05 DIAGNOSIS — G8194 Hemiplegia, unspecified affecting left nondominant side: Secondary | ICD-10-CM | POA: Diagnosis present

## 2022-12-05 DIAGNOSIS — Z888 Allergy status to other drugs, medicaments and biological substances status: Secondary | ICD-10-CM

## 2022-12-05 DIAGNOSIS — R29718 NIHSS score 18: Secondary | ICD-10-CM | POA: Diagnosis present

## 2022-12-05 DIAGNOSIS — I610 Nontraumatic intracerebral hemorrhage in hemisphere, subcortical: Principal | ICD-10-CM | POA: Diagnosis present

## 2022-12-05 DIAGNOSIS — Z79890 Hormone replacement therapy (postmenopausal): Secondary | ICD-10-CM

## 2022-12-05 DIAGNOSIS — C7951 Secondary malignant neoplasm of bone: Secondary | ICD-10-CM | POA: Diagnosis present

## 2022-12-05 DIAGNOSIS — F419 Anxiety disorder, unspecified: Secondary | ICD-10-CM | POA: Diagnosis present

## 2022-12-05 DIAGNOSIS — R29810 Facial weakness: Secondary | ICD-10-CM | POA: Diagnosis not present

## 2022-12-05 DIAGNOSIS — Z808 Family history of malignant neoplasm of other organs or systems: Secondary | ICD-10-CM

## 2022-12-05 DIAGNOSIS — Z807 Family history of other malignant neoplasms of lymphoid, hematopoietic and related tissues: Secondary | ICD-10-CM

## 2022-12-05 DIAGNOSIS — Y92019 Unspecified place in single-family (private) house as the place of occurrence of the external cause: Secondary | ICD-10-CM

## 2022-12-05 DIAGNOSIS — R531 Weakness: Secondary | ICD-10-CM | POA: Diagnosis not present

## 2022-12-05 DIAGNOSIS — Z87891 Personal history of nicotine dependence: Secondary | ICD-10-CM

## 2022-12-05 DIAGNOSIS — E039 Hypothyroidism, unspecified: Secondary | ICD-10-CM | POA: Diagnosis present

## 2022-12-05 DIAGNOSIS — R202 Paresthesia of skin: Secondary | ICD-10-CM | POA: Diagnosis not present

## 2022-12-05 DIAGNOSIS — R471 Dysarthria and anarthria: Secondary | ICD-10-CM | POA: Diagnosis present

## 2022-12-05 DIAGNOSIS — Z79899 Other long term (current) drug therapy: Secondary | ICD-10-CM

## 2022-12-05 MED ORDER — PEGFILGRASTIM-FPGK 6 MG/0.6ML ~~LOC~~ SOSY
6.0000 mg | PREFILLED_SYRINGE | Freq: Once | SUBCUTANEOUS | Status: AC
  Administered 2022-12-05: 6 mg via SUBCUTANEOUS
  Filled 2022-12-05: qty 0.6

## 2022-12-05 NOTE — ED Provider Notes (Incomplete)
[place] EMERGENCY DEPARTMENT AT [facility] Provider Note   CSN: [id number] Arrival date & time: 12/05/22  2350  An emergency department physician performed an initial assessment on this suspected stroke patient at 2350.  History  Chief Complaint  Patient presents with   Code Stroke    Erin Hoffman is a 77 y.o. female.  Patient brought to the emergency department for evaluation of possible stroke.  Patient reports that symptoms began approximately 30 minutes before coming to the emergency department.  Her upstairs neighbor checks on her every night at 11.  She was fine at that time.  Right before she called EMS, she suddenly became weak on the left side and fell to the ground.  She could not get herself back up.  She comes by EMS, left-sided deficits noted.       Home Medications Prior to Admission medications   Medication Sig Start Date End Date Taking? Authorizing Provider  acetaminophen (TYLENOL) 500 MG tablet Take 500 mg by mouth every 6 (six) hours as needed for moderate pain (pain score 4-6).    [provider]  ALPRAZolam Prudy Feeler) 0.5 MG tablet Take 0.5 mg by mouth at bedtime as needed for anxiety.    [provider]  apixaban (ELIQUIS) 5 MG TABS tablet Take 1 tablet (5 mg total) by mouth 2 (two) times daily. 10/27/22   Kathlen Mody, MD  bisacodyl (DULCOLAX) 10 MG suppository Place 1 suppository (10 mg total) rectally daily as needed for moderate constipation. 10/27/22   Kathlen Mody, MD  cyanocobalamin (,VITAMIN B-12,) 1000 MCG/ML injection Inject 1,000 mcg into the muscle every 30 (thirty) days.    [provider]  enalapril (VASOTEC) 5 MG tablet Take 5 mg by mouth daily.    [provider]  famotidine (PEPCID) 20 MG tablet Take 20 mg by mouth 2 (two) times daily as needed. 10/17/22   [provider]  fenofibrate micronized (LOFIBRA) 134 MG capsule Take 134 mg by mouth daily before breakfast.    [provider]  fexofenadine (ALLEGRA) 180 MG tablet Take 180 mg by mouth daily as needed for allergies or rhinitis.    [provider]  glimepiride (AMARYL) 4 MG tablet Take 4 mg by mouth 2 (two) times daily.    [provider]  insulin glargine (LANTUS) 100 UNIT/ML injection Inject 40 Units into the skin at bedtime as needed (high blood sugar).    [provider]  levothyroxine (SYNTHROID, LEVOTHROID) 137 MCG tablet Take 137 mcg by mouth daily before breakfast.    [provider]  lidocaine-prilocaine (EMLA) cream Apply to affected area once 11/15/22   Doreatha Massed, MD  magnesium oxide (MAG-OX) 400 (240 Mg) MG tablet Take 1 tablet (400 mg total) by mouth 2 (two) times daily. 11/21/22   Doreatha Massed, MD  metFORMIN (GLUCOPHAGE) 500 MG tablet Take 500 mg by mouth 2 (two) times daily with a meal.    [provider]  Omega-3 Fatty Acids (FISH OIL) 1200 MG CAPS Take 1 capsule by mouth daily.     [provider]  [facility] VERIO test strip 3 (three) times daily. 11/03/22   [provider]  oxyCODONE (ROXICODONE) 5 MG immediate release tablet Take 1 tablet (5 mg total) by mouth every 6 (six) hours as needed for severe pain (pain score 7-10). 11/13/22   Doreatha Massed, MD  pantoprazole (PROTONIX) 40 MG tablet Take 1 tablet (40 mg total) by mouth daily. 10/26/22   Blake Divine,  Vijaya, MD  polyethylene glycol (MIRALAX / GLYCOLAX) 17 g packet Take 17 g by mouth daily. 10/26/22   Kathlen Mody, MD  prochlorperazine (COMPAZINE) 10 MG tablet Take 1 tablet (10 mg total) by mouth every 6 (six) hours as needed (Nausea or vomiting). 11/15/22   Doreatha Massed, MD  rosuvastatin (CRESTOR) 10 MG tablet Take 10 mg by mouth once a week. 09/13/22   [provider]  triamcinolone cream (KENALOG) 0.1 % Apply 1 Application topically 2 (two) times daily. 11/28/22   Cindie Crumbly, MD      Allergies    Tape    Review of Systems    Review of Systems  Physical Exam Updated Vital Signs BP (!) 146/69   Pulse (!) 56   Temp (!) 97.4 F (36.3 C)   Resp 19   Ht 5' 9.5" (1.765 m)   Wt 84.1 kg   SpO2 96%   BMI 26.99 kg/m  Physical Exam Vitals and nursing note reviewed.  Constitutional:      General: She is not in acute distress.    Appearance: She is well-developed.  HENT:     Head: Normocephalic and atraumatic.     Mouth/Throat:     Mouth: Mucous membranes are moist.  Eyes:     General: Vision grossly intact. Gaze aligned appropriately.     Extraocular Movements: Extraocular movements intact.     Conjunctiva/sclera: Conjunctivae normal.  Cardiovascular:     Rate and Rhythm: Normal rate and regular rhythm.     Pulses: Normal pulses.     Heart sounds: Normal heart sounds, S1 normal and S2 normal. No murmur heard.    No friction rub. No gallop.  Pulmonary:     Effort: Pulmonary effort is normal. No respiratory distress.     Breath sounds: Normal breath sounds.  Abdominal:     General: Bowel sounds are normal.     Palpations: Abdomen is soft.     Tenderness: There is no abdominal tenderness. There is no guarding or rebound.     Hernia: No hernia is present.  Musculoskeletal:        General: No swelling.     Cervical back: Full passive range of motion without pain, normal range of motion and neck supple. No spinous process tenderness or muscular tenderness. Normal range of motion.     Right lower leg: No edema.     Left lower leg: No edema.  Skin:    General: Skin is warm and dry.     Capillary Refill: Capillary refill takes less than 2 seconds.     Findings: No ecchymosis, erythema, rash or wound.  Neurological:     General: No focal deficit present.     Mental Status: She is alert and oriented to person, place, and time.     GCS: GCS eye subscore is 4. GCS verbal subscore is 5. GCS motor subscore is 6.     Cranial Nerves: Facial asymmetry present.     Sensory: Sensory deficit present.     Motor:  Weakness and abnormal muscle tone present.  Psychiatric:        Attention and Perception: Attention normal.        Mood and Affect: Mood normal.        Speech: Speech normal.        Behavior: Behavior normal.     ED Results / Procedures / Treatments   Labs (all labs ordered are listed, but only abnormal results are displayed) Labs Reviewed  PROTIME-INR - Abnormal; Notable for the following components:      Result Value   Prothrombin Time 19.8 (*)    INR 1.7 (*)    All other components within normal limits  CBC - Abnormal; Notable for the following components:   WBC 22.2 (*)    Hemoglobin 11.6 (*)    RDW 15.9 (*)    All other components within normal limits  DIFFERENTIAL - Abnormal; Notable for the following components:   Neutro Abs 20.2 (*)    Abs Immature Granulocytes 0.13 (*)    All other components within normal limits  COMPREHENSIVE METABOLIC PANEL - Abnormal; Notable for the following components:   Sodium 132 (*)    CO2 21 (*)    Glucose, Bld 336 (*)    BUN 33 (*)    Creatinine, Ser 1.08 (*)    AST 58 (*)    GFR, Estimated 52 (*)    All other components within normal limits  ETHANOL  APTT  RAPID URINE DRUG SCREEN, HOSP PERFORMED  URINALYSIS, ROUTINE W REFLEX MICROSCOPIC  I-STAT CHEM 8, ED    EKG None Afib, rate controlled, LVH, no acute changes  Radiology CT Cervical Spine Wo Contrast  Result Date: 12/06/2022 CLINICAL DATA:  Left-sided weakness and neck trauma EXAM: CT CERVICAL SPINE WITHOUT CONTRAST TECHNIQUE: Multidetector CT imaging of the cervical spine was performed without intravenous contrast. Multiplanar CT image reconstructions were also generated. RADIATION DOSE REDUCTION: This exam was performed according to the departmental dose-optimization program which includes automated exposure control, adjustment of the mA and/or kV according to patient size and/or use of iterative reconstruction technique. COMPARISON:  None are available FINDINGS: Alignment:  No evidence of traumatic malalignment. Skull base and vertebrae: No acute fracture. Soft tissues and spinal canal: No prevertebral fluid or swelling. No visible canal hematoma. Disc levels: Multilevel spondylosis, disc space height loss, and degenerative endplate changes greatest at C5-C6 where it is moderate. Multilevel moderate to advanced facet arthropathy. Spinal canal narrowing is greatest at C3-C4 and C5-C6 where it is mild secondary to posterior disc osteophyte complexes. Upper chest: No acute abnormality. Other: Mild carotid atherosclerotic calcification. IMPRESSION: No acute fracture in the cervical spine. Electronically Signed   By: Minerva Fester M.D.   On: 12/06/2022 00:36   CT HEAD CODE STROKE WO CONTRAST  Result Date: 12/06/2022 CLINICAL DATA:  Code stroke. Initial evaluation for neuro deficit, stroke. EXAM: CT HEAD WITHOUT CONTRAST TECHNIQUE: Contiguous axial images were obtained from the base of the skull through the vertex without intravenous contrast. RADIATION DOSE REDUCTION: This exam was performed according to the departmental dose-optimization program which includes automated exposure control, adjustment of the mA and/or kV according to patient size and/or use of iterative reconstruction technique. COMPARISON:  None Available. FINDINGS: Brain: Age-related cerebral atrophy with chronic small vessel ischemic disease. Acute intraparenchymal hemorrhage centered at the right thalamus measures 2.3 x 1.4 x 2.8 cm (estimated volume 5 mL). Mild localized edema without significant regional mass effect. Associated intraventricular extension with small volume blood in the right lateral ventricle. No hydrocephalus or trapping. No other acute intracranial hemorrhage. No other acute large vessel territory infarct. No mass lesion or midline shift. No hydrocephalus or extra-axial fluid collection. Vascular: No abnormal hyperdense vessel. Scattered vascular calcifications noted within the carotid siphons.  Skull: Scalp soft tissues within normal limits.  Calvarium intact. Sinuses/Orbits: Globes and orbital soft tissues within normal limits. Scattered mucosal thickening about the ethmoidal air cells. Paranasal sinuses are otherwise clear. No mastoid  effusion. Other: 9. ASPECTS ([place] Stroke Program Early CT Score) Acute ICH, does not apply. IMPRESSION: 1. 2.3 x 1.4 x 2.8 cm acute intraparenchymal hemorrhage centered at the right thalamus (estimated volume 5 mL). Associated intraventricular extension with small volume blood in the right lateral ventricle. No hydrocephalus or trapping. 2. Underlying age-related cerebral atrophy with chronic small vessel ischemic disease. Critical Value/emergent results were called by telephone at the time of interpretation on 12/06/2022 at 12:11 am to provider [facility] , who verbally acknowledged these results. Electronically Signed   By: Rise Mu M.D.   On: 12/06/2022 00:14    Procedures Procedures    Medications Ordered in ED Medications  coag fact Xa recombinant (ANDEXXA) low dose infusion 900 mg (has no administration in time range)  labetalol (NORMODYNE) injection 20 mg (20 mg Intravenous Given 12/06/22 0025)    And  clevidipine (CLEVIPREX) infusion 0.5 mg/mL (has no administration in time range)    ED Course/ Medical Decision Making/ A&P                                 Medical Decision Making Amount and/or Complexity of Data Reviewed Labs: ordered. Decision-making details documented in ED Course. Radiology: ordered and independent interpretation performed. Decision-making details documented in ED Course. ECG/medicine tests: ordered and independent interpretation performed. Decision-making details documented in ED Course.  Risk Prescription drug management. Decision regarding hospitalization.   Differential diagnosis considered includes, but not limited to:  TIA; Stroke; seizure; metabolic encephalopathy   Patient brought to  the emergency department by ambulance from home as a code stroke.  Patient with sudden onset of left-sided weakness approximately 30 minutes before arrival.  Patient reports that she did fall to the floor secondary to the weakness, no injury.  Examination at arrival with fairly significant left-sided hemiparesis.  Patient brought to CT scanner for stroke workup, noncontrast CT does show right sided intraparenchymal bleed consistent with hypertensive bleed.  She is awake, alert and oriented, normal mentation.  Patient on Eliquis secondary to atrial fibrillation, last dose around 3 PM today.  Discussed with pharmacy, Andexxa ordered and dosed.  Blood pressure control with labetalol and Cleviprex.  Discussed with Dr. Iver Nestle, on-call for neurology at [facility].  Patient will be transferred to the neuro ICU on the neuro service.  Teleneurology examination ongoing at this time.  CRITICAL CARE Performed by: Gilda Crease   Total critical care time: 33 minutes  Critical care time was exclusive of separately billable procedures and treating other patients.  Critical care was necessary to treat or prevent imminent or life-threatening deterioration.  Critical care was time spent personally by me on the following activities: development of treatment plan with patient and/or surrogate as well as nursing, discussions with consultants, evaluation of patient's response to treatment, examination of patient, obtaining history from patient or surrogate, ordering and performing treatments and interventions, ordering and review of laboratory studies, ordering and review of radiographic studies, pulse oximetry and re-evaluation of patient's condition.        Final Clinical Impression(s) / ED Diagnoses Final diagnoses:  Right-sided nontraumatic intracerebral hemorrhage, unspecified cerebral location [facility])    Rx / DC Orders ED Discharge Orders     None         Miklo Aken, Canary Brim,  MD 12/06/22 0981    Gilda Crease, MD 12/06/22 [phone]

## 2022-12-05 NOTE — ED Triage Notes (Signed)
Rcems for code stroke.  LKW 2300 Pt's neighbor was checking on her when she developed sudden left side weakness and left side droop. Patient then fell to the floor because of the weakness. Unable to lift side off bed. Oriented at baseline  192/79  CBG 283 with ems.

## 2022-12-05 NOTE — Patient Instructions (Signed)
[place] [facility] - A DEPT OF [facility]  Discharge Instructions: Thank you for choosing [facility] to provide your oncology and hematology care.  If you have a lab appointment with the Cancer Center - please note that after April 8th, 2024, all labs will be drawn in the cancer center.  You do not have to check in or register with the main entrance as you have in the past but will complete your check-in in the cancer center.  Wear comfortable clothing and clothing appropriate for easy access to any Portacath or PICC line.   We strive to give you quality time with your provider. You may need to reschedule your appointment if you arrive late (15 or more minutes).  Arriving late affects you and other patients whose appointments are after yours.  Also, if you miss three or more appointments without notifying the office, you may be dismissed from the clinic at the provider's discretion.      For prescription refill requests, have your pharmacy contact our office and allow 72 hours for refills to be completed.    Today you received the following chemotherapy and/or immunotherapy agents Stimufend.  Pegfilgrastim Injection What is this medication? PEGFILGRASTIM (PEG fil gra stim) lowers the risk of infection in people who are receiving chemotherapy. It works by Systems analyst make more white blood cells, which protects your body from infection. It may also be used to help people who have been exposed to high doses of radiation. This medicine may be used for other purposes; ask your health care provider or pharmacist if you have questions. COMMON BRAND NAME(S): Cherly Hensen, Neulasta, Nyvepria, Stimufend, UDENYCA, UDENYCA ONBODY, Ziextenzo What should I tell my care team before I take this medication? They need to know if you have any of these conditions: Kidney disease Latex allergy Ongoing radiation therapy Sickle cell disease Skin reactions to  acrylic adhesives (On-Body Injector only) An unusual or allergic reaction to pegfilgrastim, filgrastim, other medications, foods, dyes, or preservatives Pregnant or trying to get pregnant Breast-feeding How should I use this medication? This medication is for injection under the skin. If you get this medication at home, you will be taught how to prepare and give the pre-filled syringe or how to use the On-body Injector. Refer to the patient Instructions for Use for detailed instructions. Use exactly as directed. Tell your care team immediately if you suspect that the On-body Injector may not have performed as intended or if you suspect the use of the On-body Injector resulted in a missed or partial dose. It is important that you put your used needles and syringes in a special sharps container. Do not put them in a trash can. If you do not have a sharps container, call your pharmacist or care team to get one. Talk to your care team about the use of this medication in children. While this medication may be prescribed for selected conditions, precautions do apply. Overdosage: If you think you have taken too much of this medicine contact a poison control center or emergency room at once. NOTE: This medicine is only for you. Do not share this medicine with others. What if I miss a dose? It is important not to miss your dose. Call your care team if you miss your dose. If you miss a dose due to an On-body Injector failure or leakage, a new dose should be administered as soon as possible using a single prefilled syringe for manual  use. What may interact with this medication? Interactions have not been studied. This list may not describe all possible interactions. Give your health care provider a list of all the medicines, herbs, non-prescription drugs, or dietary supplements you use. Also tell them if you smoke, drink alcohol, or use illegal drugs. Some items may interact with your medicine. What should I  watch for while using this medication? Your condition will be monitored carefully while you are receiving this medication. You may need blood work done while you are taking this medication. Talk to your care team about your risk of cancer. You may be more at risk for certain types of cancer if you take this medication. If you are going to need a MRI, CT scan, or other procedure, tell your care team that you are using this medication (On-Body Injector only). What side effects may I notice from receiving this medication? Side effects that you should report to your care team as soon as possible: Allergic reactions--skin rash, itching, hives, swelling of the face, lips, tongue, or throat Capillary leak syndrome--stomach or muscle pain, unusual weakness or fatigue, feeling faint or lightheaded, decrease in the amount of urine, swelling of the ankles, hands, or feet, trouble breathing High white blood cell level--fever, fatigue, trouble breathing, night sweats, change in vision, weight loss Inflammation of the aorta--fever, fatigue, back, chest, or stomach pain, severe headache Kidney injury (glomerulonephritis)--decrease in the amount of urine, red or dark Erin Hoffman urine, foamy or bubbly urine, swelling of the ankles, hands, or feet Shortness of breath or trouble breathing Spleen injury--pain in upper left stomach or shoulder Unusual bruising or bleeding Side effects that usually do not require medical attention (report to your care team if they continue or are bothersome): Bone pain Pain in the hands or feet This list may not describe all possible side effects. Call your doctor for medical advice about side effects. You may report side effects to FDA at 1-800-FDA-1088. Where should I keep my medication? Keep out of the reach of children. If you are using this medication at home, you will be instructed on how to store it. Throw away any unused medication after the expiration date on the label. NOTE:  This sheet is a summary. It may not cover all possible information. If you have questions about this medicine, talk to your doctor, pharmacist, or health care provider.  2024 Elsevier/Gold Standard (2020-11-25 00:00:00)        To help prevent nausea and vomiting after your treatment, we encourage you to take your nausea medication as directed.  BELOW ARE SYMPTOMS THAT SHOULD BE REPORTED IMMEDIATELY: *FEVER GREATER THAN 100.4 F (38 C) OR HIGHER *CHILLS OR SWEATING *NAUSEA AND VOMITING THAT IS NOT CONTROLLED WITH YOUR NAUSEA MEDICATION *UNUSUAL SHORTNESS OF BREATH *UNUSUAL BRUISING OR BLEEDING *URINARY PROBLEMS (pain or burning when urinating, or frequent urination) *BOWEL PROBLEMS (unusual diarrhea, constipation, pain near the anus) TENDERNESS IN MOUTH AND THROAT WITH OR WITHOUT PRESENCE OF ULCERS (sore throat, sores in mouth, or a toothache) UNUSUAL RASH, SWELLING OR PAIN  UNUSUAL VAGINAL DISCHARGE OR ITCHING   Items with * indicate a potential emergency and should be followed up as soon as possible or go to the Emergency Department if any problems should occur.  Please show the CHEMOTHERAPY ALERT CARD or IMMUNOTHERAPY ALERT CARD at check-in to the Emergency Department and triage nurse.  Should you have questions after your visit or need to cancel or reschedule your appointment, please contact [place] [facility] - A  DEPT OF [facility]igh[facility] [phone]  and follow the prompts.  Office hours are 8:00 a.m. to 4:30 p.m. Monday - Friday. Please note that voicemails left after 4:00 p.m. may not be returned until the following business day.  We are closed weekends and major holidays. You have access to a nurse at all times for urgent questions. Please call the main number to the clinic [phone] and follow the prompts.  For any non-urgent questions, you may also contact your provider using MyChart. We now offer e-Visits for anyone 2 and older to request care online for  non-urgent symptoms. For details visit [email or web address].   Also download the MyChart app! Go to the app store, search "MyChart", open the app, select [place], and log in with your MyChart username and password.

## 2022-12-05 NOTE — Progress Notes (Signed)
Patient tolerated Stimufend injection with no complaints voiced.  Site clean and dry with no bruising or swelling noted.  No complaints of pain.  Discharged with vital signs stable and no signs or symptoms of distress noted.

## 2022-12-06 ENCOUNTER — Inpatient Hospital Stay (HOSPITAL_COMMUNITY): Payer: HMO

## 2022-12-06 ENCOUNTER — Emergency Department (HOSPITAL_COMMUNITY): Payer: HMO

## 2022-12-06 ENCOUNTER — Other Ambulatory Visit (HOSPITAL_COMMUNITY): Payer: HMO

## 2022-12-06 ENCOUNTER — Other Ambulatory Visit: Payer: Self-pay

## 2022-12-06 DIAGNOSIS — Z515 Encounter for palliative care: Secondary | ICD-10-CM

## 2022-12-06 DIAGNOSIS — I672 Cerebral atherosclerosis: Secondary | ICD-10-CM | POA: Diagnosis not present

## 2022-12-06 DIAGNOSIS — W1830XA Fall on same level, unspecified, initial encounter: Secondary | ICD-10-CM | POA: Diagnosis present

## 2022-12-06 DIAGNOSIS — Z66 Do not resuscitate: Secondary | ICD-10-CM | POA: Diagnosis not present

## 2022-12-06 DIAGNOSIS — D72829 Elevated white blood cell count, unspecified: Secondary | ICD-10-CM | POA: Diagnosis not present

## 2022-12-06 DIAGNOSIS — R531 Weakness: Secondary | ICD-10-CM | POA: Diagnosis not present

## 2022-12-06 DIAGNOSIS — I619 Nontraumatic intracerebral hemorrhage, unspecified: Secondary | ICD-10-CM | POA: Diagnosis not present

## 2022-12-06 DIAGNOSIS — S199XXA Unspecified injury of neck, initial encounter: Secondary | ICD-10-CM | POA: Diagnosis not present

## 2022-12-06 DIAGNOSIS — I6523 Occlusion and stenosis of bilateral carotid arteries: Secondary | ICD-10-CM | POA: Diagnosis not present

## 2022-12-06 DIAGNOSIS — I6529 Occlusion and stenosis of unspecified carotid artery: Secondary | ICD-10-CM | POA: Diagnosis not present

## 2022-12-06 DIAGNOSIS — I700 Atherosclerosis of aorta: Secondary | ICD-10-CM | POA: Diagnosis not present

## 2022-12-06 DIAGNOSIS — G4700 Insomnia, unspecified: Secondary | ICD-10-CM | POA: Diagnosis not present

## 2022-12-06 DIAGNOSIS — M6281 Muscle weakness (generalized): Secondary | ICD-10-CM | POA: Diagnosis not present

## 2022-12-06 DIAGNOSIS — G8190 Hemiplegia, unspecified affecting unspecified side: Secondary | ICD-10-CM | POA: Diagnosis not present

## 2022-12-06 DIAGNOSIS — H538 Other visual disturbances: Secondary | ICD-10-CM | POA: Diagnosis not present

## 2022-12-06 DIAGNOSIS — Y92019 Unspecified place in single-family (private) house as the place of occurrence of the external cause: Secondary | ICD-10-CM | POA: Diagnosis not present

## 2022-12-06 DIAGNOSIS — I4891 Unspecified atrial fibrillation: Secondary | ICD-10-CM | POA: Diagnosis not present

## 2022-12-06 DIAGNOSIS — G936 Cerebral edema: Secondary | ICD-10-CM | POA: Diagnosis not present

## 2022-12-06 DIAGNOSIS — R41841 Cognitive communication deficit: Secondary | ICD-10-CM | POA: Diagnosis not present

## 2022-12-06 DIAGNOSIS — I615 Nontraumatic intracerebral hemorrhage, intraventricular: Secondary | ICD-10-CM | POA: Diagnosis not present

## 2022-12-06 DIAGNOSIS — M25552 Pain in left hip: Secondary | ICD-10-CM | POA: Diagnosis not present

## 2022-12-06 DIAGNOSIS — E785 Hyperlipidemia, unspecified: Secondary | ICD-10-CM | POA: Diagnosis not present

## 2022-12-06 DIAGNOSIS — R930 Abnormal findings on diagnostic imaging of skull and head, not elsewhere classified: Secondary | ICD-10-CM | POA: Diagnosis not present

## 2022-12-06 DIAGNOSIS — C782 Secondary malignant neoplasm of pleura: Secondary | ICD-10-CM | POA: Diagnosis not present

## 2022-12-06 DIAGNOSIS — R21 Rash and other nonspecific skin eruption: Secondary | ICD-10-CM | POA: Diagnosis not present

## 2022-12-06 DIAGNOSIS — E119 Type 2 diabetes mellitus without complications: Secondary | ICD-10-CM | POA: Diagnosis not present

## 2022-12-06 DIAGNOSIS — R29718 NIHSS score 18: Secondary | ICD-10-CM | POA: Diagnosis not present

## 2022-12-06 DIAGNOSIS — C7951 Secondary malignant neoplasm of bone: Secondary | ICD-10-CM | POA: Diagnosis not present

## 2022-12-06 DIAGNOSIS — T458X5A Adverse effect of other primarily systemic and hematological agents, initial encounter: Secondary | ICD-10-CM | POA: Diagnosis not present

## 2022-12-06 DIAGNOSIS — R1311 Dysphagia, oral phase: Secondary | ICD-10-CM | POA: Diagnosis not present

## 2022-12-06 DIAGNOSIS — I480 Paroxysmal atrial fibrillation: Secondary | ICD-10-CM | POA: Diagnosis not present

## 2022-12-06 DIAGNOSIS — I6389 Other cerebral infarction: Secondary | ICD-10-CM

## 2022-12-06 DIAGNOSIS — Z7189 Other specified counseling: Secondary | ICD-10-CM | POA: Diagnosis not present

## 2022-12-06 DIAGNOSIS — I610 Nontraumatic intracerebral hemorrhage in hemisphere, subcortical: Secondary | ICD-10-CM | POA: Diagnosis present

## 2022-12-06 DIAGNOSIS — I6782 Cerebral ischemia: Secondary | ICD-10-CM | POA: Diagnosis not present

## 2022-12-06 DIAGNOSIS — M4802 Spinal stenosis, cervical region: Secondary | ICD-10-CM | POA: Diagnosis not present

## 2022-12-06 DIAGNOSIS — Z7401 Bed confinement status: Secondary | ICD-10-CM | POA: Diagnosis not present

## 2022-12-06 DIAGNOSIS — M6259 Muscle wasting and atrophy, not elsewhere classified, multiple sites: Secondary | ICD-10-CM | POA: Diagnosis not present

## 2022-12-06 DIAGNOSIS — R471 Dysarthria and anarthria: Secondary | ICD-10-CM | POA: Diagnosis not present

## 2022-12-06 DIAGNOSIS — R1310 Dysphagia, unspecified: Secondary | ICD-10-CM | POA: Diagnosis not present

## 2022-12-06 DIAGNOSIS — Z794 Long term (current) use of insulin: Secondary | ICD-10-CM | POA: Diagnosis not present

## 2022-12-06 DIAGNOSIS — E1165 Type 2 diabetes mellitus with hyperglycemia: Secondary | ICD-10-CM | POA: Diagnosis not present

## 2022-12-06 DIAGNOSIS — G8194 Hemiplegia, unspecified affecting left nondominant side: Secondary | ICD-10-CM | POA: Diagnosis not present

## 2022-12-06 DIAGNOSIS — E039 Hypothyroidism, unspecified: Secondary | ICD-10-CM | POA: Diagnosis not present

## 2022-12-06 DIAGNOSIS — C221 Intrahepatic bile duct carcinoma: Secondary | ICD-10-CM | POA: Diagnosis not present

## 2022-12-06 DIAGNOSIS — M8488 Other disorders of continuity of bone, other site: Secondary | ICD-10-CM | POA: Diagnosis not present

## 2022-12-06 DIAGNOSIS — I10 Essential (primary) hypertension: Secondary | ICD-10-CM | POA: Diagnosis not present

## 2022-12-06 DIAGNOSIS — R29810 Facial weakness: Secondary | ICD-10-CM | POA: Diagnosis not present

## 2022-12-06 DIAGNOSIS — F419 Anxiety disorder, unspecified: Secondary | ICD-10-CM | POA: Diagnosis not present

## 2022-12-06 LAB — CBC
HCT: 36.2 % (ref 36.0–46.0)
Hemoglobin: 11.6 g/dL — ABNORMAL LOW (ref 12.0–15.0)
MCH: 28.8 pg (ref 26.0–34.0)
MCHC: 32 g/dL (ref 30.0–36.0)
MCV: 89.8 fL (ref 80.0–100.0)
Platelets: 322 10*3/uL (ref 150–400)
RBC: 4.03 MIL/uL (ref 3.87–5.11)
RDW: 15.9 % — ABNORMAL HIGH (ref 11.5–15.5)
WBC: 22.2 10*3/uL — ABNORMAL HIGH (ref 4.0–10.5)
nRBC: 0 % (ref 0.0–0.2)

## 2022-12-06 LAB — ECHOCARDIOGRAM COMPLETE
AR max vel: 2.06 cm2
AV Area VTI: 2.12 cm2
AV Area mean vel: 2.21 cm2
AV Mean grad: 6 mm[Hg]
AV Peak grad: 11.3 mm[Hg]
Ao pk vel: 1.68 m/s
Area-P 1/2: 3.89 cm2
Height: 69.5 in
S' Lateral: 3.7 cm
Weight: 2966.51 [oz_av]

## 2022-12-06 LAB — TYPE AND SCREEN
ABO/RH(D): A POS
Antibody Screen: NEGATIVE

## 2022-12-06 LAB — DIFFERENTIAL
Abs Immature Granulocytes: 0.13 10*3/uL — ABNORMAL HIGH (ref 0.00–0.07)
Basophils Absolute: 0.1 10*3/uL (ref 0.0–0.1)
Basophils Relative: 0 %
Eosinophils Absolute: 0 10*3/uL (ref 0.0–0.5)
Eosinophils Relative: 0 %
Immature Granulocytes: 1 %
Lymphocytes Relative: 6 %
Lymphs Abs: 1.4 10*3/uL (ref 0.7–4.0)
Monocytes Absolute: 0.5 10*3/uL (ref 0.1–1.0)
Monocytes Relative: 2 %
Neutro Abs: 20.2 10*3/uL — ABNORMAL HIGH (ref 1.7–7.7)
Neutrophils Relative %: 91 %

## 2022-12-06 LAB — PROTIME-INR
INR: 1.7 — ABNORMAL HIGH (ref 0.8–1.2)
Prothrombin Time: 19.8 s — ABNORMAL HIGH (ref 11.4–15.2)

## 2022-12-06 LAB — COMPREHENSIVE METABOLIC PANEL
ALT: 25 U/L (ref 0–44)
AST: 58 U/L — ABNORMAL HIGH (ref 15–41)
Albumin: 3.7 g/dL (ref 3.5–5.0)
Alkaline Phosphatase: 114 U/L (ref 38–126)
Anion gap: 10 (ref 5–15)
BUN: 33 mg/dL — ABNORMAL HIGH (ref 8–23)
CO2: 21 mmol/L — ABNORMAL LOW (ref 22–32)
Calcium: 9.1 mg/dL (ref 8.9–10.3)
Chloride: 101 mmol/L (ref 98–111)
Creatinine, Ser: 1.08 mg/dL — ABNORMAL HIGH (ref 0.44–1.00)
GFR, Estimated: 52 mL/min — ABNORMAL LOW (ref >60)
Glucose, Bld: 336 mg/dL — ABNORMAL HIGH (ref 70–99)
Potassium: 4.8 mmol/L (ref 3.5–5.1)
Sodium: 132 mmol/L — ABNORMAL LOW (ref 135–145)
Total Bilirubin: 1.1 mg/dL (ref <1.2)
Total Protein: 7.3 g/dL (ref 6.5–8.1)

## 2022-12-06 LAB — MRSA NEXT GEN BY PCR, NASAL: MRSA by PCR Next Gen: NOT DETECTED

## 2022-12-06 LAB — GLUCOSE, CAPILLARY
Glucose-Capillary: 294 mg/dL — ABNORMAL HIGH (ref 70–99)
Glucose-Capillary: 201 mg/dL — ABNORMAL HIGH (ref 70–99)
Glucose-Capillary: 137 mg/dL — ABNORMAL HIGH (ref 70–99)
Glucose-Capillary: 97 mg/dL (ref 70–99)
Glucose-Capillary: 70 mg/dL (ref 70–99)
Glucose-Capillary: 84 mg/dL (ref 70–99)

## 2022-12-06 LAB — ABO/RH: ABO/RH(D): A POS

## 2022-12-06 LAB — ETHANOL: Alcohol, Ethyl (B): <10 mg/dL (ref <10)

## 2022-12-06 LAB — APTT: aPTT: 34 s (ref 24–36)

## 2022-12-06 LAB — MAGNESIUM: Magnesium: 2.1 mg/dL (ref 1.7–2.4)

## 2022-12-06 MED ORDER — ACETAMINOPHEN 10 MG/ML IV SOLN
1000.0000 mg | Freq: Four times a day (QID) | INTRAVENOUS | Status: AC
  Administered 2022-12-06 – 2022-12-07 (×3): 1000 mg via INTRAVENOUS
  Filled 2022-12-06 (×3): qty 100

## 2022-12-06 MED ORDER — SENNOSIDES-DOCUSATE SODIUM 8.6-50 MG PO TABS
1.0000 | ORAL_TABLET | Freq: Two times a day (BID) | ORAL | Status: DC
  Administered 2022-12-07 – 2022-12-19 (×21): 1 via ORAL
  Filled 2022-12-06 (×22): qty 1

## 2022-12-06 MED ORDER — IOHEXOL 350 MG/ML SOLN
75.0000 mL | Freq: Once | INTRAVENOUS | Status: AC | PRN
  Administered 2022-12-06: 75 mL via INTRAVENOUS

## 2022-12-06 MED ORDER — SODIUM CHLORIDE 0.9% IV SOLUTION: Freq: Once | INTRAVENOUS | Status: AC

## 2022-12-06 MED ORDER — CHLORHEXIDINE GLUCONATE CLOTH 2 % EX PADS
6.0000 | MEDICATED_PAD | Freq: Every day | CUTANEOUS | Status: DC
  Administered 2022-12-06 – 2022-12-19 (×14): 6 via TOPICAL

## 2022-12-06 MED ORDER — CLEVIDIPINE BUTYRATE 0.5 MG/ML IV EMUL
0.0000 mg/h | INTRAVENOUS | Status: DC
  Administered 2022-12-06: 2 mg/h via INTRAVENOUS
  Filled 2022-12-06 (×2): qty 50

## 2022-12-06 MED ORDER — MORPHINE SULFATE (PF) 2 MG/ML IV SOLN
1.0000 mg | Freq: Once | INTRAVENOUS | Status: AC
  Administered 2022-12-06: 1 mg via INTRAVENOUS
  Filled 2022-12-06: qty 1

## 2022-12-06 MED ORDER — ACETAMINOPHEN 325 MG PO TABS
650.0000 mg | ORAL_TABLET | ORAL | Status: DC | PRN
  Administered 2022-12-06: 650 mg via ORAL
  Filled 2022-12-06: qty 2

## 2022-12-06 MED ORDER — ONDANSETRON HCL 4 MG/2ML IJ SOLN: 4.0000 mg | Freq: Four times a day (QID) | INTRAMUSCULAR | Status: DC | PRN

## 2022-12-06 MED ORDER — LABETALOL HCL 5 MG/ML IV SOLN
20.0000 mg | Freq: Once | INTRAVENOUS | Status: AC
  Administered 2022-12-06: 20 mg via INTRAVENOUS
  Filled 2022-12-06: qty 4

## 2022-12-06 MED ORDER — PROCHLORPERAZINE EDISYLATE 10 MG/2ML IJ SOLN: 10.0000 mg | Freq: Four times a day (QID) | INTRAMUSCULAR | Status: DC | PRN

## 2022-12-06 MED ORDER — STROKE: EARLY STAGES OF RECOVERY BOOK: Freq: Once | Status: AC

## 2022-12-06 MED ORDER — PANTOPRAZOLE SODIUM 40 MG IV SOLR
40.0000 mg | Freq: Every day | INTRAVENOUS | Status: DC
  Administered 2022-12-06: 40 mg via INTRAVENOUS
  Filled 2022-12-06: qty 10

## 2022-12-06 MED ORDER — ACETAMINOPHEN 650 MG RE SUPP: 650.0000 mg | RECTAL | Status: DC | PRN

## 2022-12-06 MED ORDER — ACETAMINOPHEN 160 MG/5ML PO SOLN: 650.0000 mg | ORAL | Status: DC | PRN

## 2022-12-06 MED ORDER — GADOBUTROL 1 MMOL/ML IV SOLN
8.0000 mL | Freq: Once | INTRAVENOUS | Status: AC | PRN
  Administered 2022-12-06: 8 mL via INTRAVENOUS

## 2022-12-06 MED ORDER — EMPTY CONTAINERS FLEXIBLE MISC
900.0000 mg | Freq: Once | Status: AC
  Administered 2022-12-06: 900 mg via INTRAVENOUS
  Filled 2022-12-06: qty 90

## 2022-12-06 MED ORDER — INSULIN ASPART 100 UNIT/ML IJ SOLN
0.0000 [IU] | INTRAMUSCULAR | Status: DC
  Administered 2022-12-06: 5 [IU] via SUBCUTANEOUS
  Administered 2022-12-06: 1 [IU] via SUBCUTANEOUS
  Administered 2022-12-06: 3 [IU] via SUBCUTANEOUS
  Administered 2022-12-07: 2 [IU] via SUBCUTANEOUS
  Administered 2022-12-07: 1 [IU] via SUBCUTANEOUS
  Administered 2022-12-07 (×2): 2 [IU] via SUBCUTANEOUS
  Administered 2022-12-08: 1 [IU] via SUBCUTANEOUS

## 2022-12-06 MED ORDER — SODIUM CHLORIDE 0.9% FLUSH
10.0000 mL | Freq: Two times a day (BID) | INTRAVENOUS | Status: DC
  Administered 2022-12-06 – 2022-12-11 (×11): 10 mL
  Administered 2022-12-12 – 2022-12-13 (×2): 20 mL
  Administered 2022-12-13: 10 mL
  Administered 2022-12-14: 20 mL
  Administered 2022-12-14: 10 mL
  Administered 2022-12-15: 20 mL
  Administered 2022-12-15 – 2022-12-19 (×8): 10 mL

## 2022-12-06 NOTE — H&P (Addendum)
NEUROLOGY H&P NOTE   Date of service: December 06, 2022 Patient Name: Erin Hoffman MRN:  [id number] DOB:  11-27-1945 Chief Complaint: "Left-sided weakness"  History of Present Illness  Erin Hoffman is a 77 y.o. 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  She was evaluated as a code stroke at [facility] and given ICH, Eliquis was reversed with Andexxa 900 mg and she was started on clevidipine after receiving 20 mg of labetalol for blood pressure control  Regarding her malignancy, she recently started immunotherapy with gemcitabine, cisplatin and durvalumab on 11/21/22, with development of a rash around 11/18 for which she is being treated with topical steroid cream (triamcinolone 0.1% twice daily)  Last known well: 2300 (neighbor had checked on her at that time) Modified rankin score: 0-Completely asymptomatic and back to baseline post- stroke ICH Score: 1 (1 point for IVH) tNKASE: Not offered due to ICH Thrombectomy: not offered due to ICH NIHSS components Score: Comment  1a Level of Conscious 0[x]  1[]  2[]  3[]      1b LOC Questions 0[x]  1[]  2[]       1c LOC Commands 0[x]  1[]  2[]       2 Best Gaze 0[]  1[x]  2[]      Left eye is medially deviated with torsional nystagmus  3 Visual 0[]  1[]  2[x]  3[]      4 Facial Palsy 0[]  1[]  2[x]  3[]      5a Motor Arm - left 0[]  1[]  2[]  3[]  4[x]  UN[]    5b Motor Arm - Right 0[x]  1[]  2[]  3[]  4[]  UN[]    6a Motor Leg - Left 0[]  1[]  2[]  3[x]  4[]  UN[]    6b Motor Leg -  Right 0[]  1[x]  2[]  3[]  4[]  UN[]    7 Limb Ataxia 0[x]  1[]  2[]  3[]  UN[]     8 Sensory 0[]  1[]  2[x]  UN[]      9 Best Language 0[x]  1[]  2[]  3[]      10 Dysarthria 0[]  1[x]  2[]  UN[]      11 Extinct. and Inattention 0[]  1[]  2[x]       TOTAL:       ROS  Limited in the setting of acute stroke but patient denies any other recent concerns  Past History   Past Medical History:  Diagnosis Date   Anemia    Anxiety    Diabetes mellitus    H/O hiatal hernia    Hypertension    Hypothyroidism    Past Surgical History:  Procedure Laterality Date   ABDOMINAL HYSTERECTOMY     CATARACT EXTRACTION W/PHACO Left 06/16/2013   Procedure: CATARACT EXTRACTION PHACO AND INTRAOCULAR LENS PLACEMENT (IOC);  Surgeon: [name] T. Nile Riggs, MD;  Location: [facility] ORS;  Service: Ophthalmology;  Laterality: Left;  CDE:4.65   COLONOSCOPY N/A 10/01/2014   Procedure: COLONOSCOPY;  Surgeon: Malissa Hippo, MD;  Location: [facility] ENDO SUITE;  Service: Endoscopy;  Laterality: N/A;  1125   IR IMAGING GUIDED PORT INSERTION  11/20/2022   TONSILLECTOMY     TUBAL LIGATION     Family History  Problem Relation Age of Onset   Brain cancer Father  Crohn's disease Sister    Coronary artery disease Sister    Multiple myeloma Brother    Diabetes Brother    Hypertension Brother    Rheum arthritis Mother    Social History   Socioeconomic History   Marital status: Divorced    Spouse name: Not on file   Number of children: Not on file   Years of education: Not on file   Highest education level: Not on file  Occupational History   Not on file  Tobacco Use   Smoking status: Former    Current packs/day: 0.00    Average packs/day: 1 pack/day for 30.0 years (30.0 ttl pk-yrs)    Types: Cigarettes    Start date: 06/11/1958    Quit date: 06/10/1988    Years since quitting: 34.5   Smokeless tobacco: Never  Substance and Sexual Activity   Alcohol use: No   Drug use: No   Sexual activity: Yes    Birth control/protection: Surgical  Other  Topics Concern   Not on file  Social History Narrative   Not on file   Social Determinants of Health   Financial Resource Strain: Not on file  Food Insecurity: No Food Insecurity (10/23/2022)   Hunger Vital Sign    Worried About Running Out of Food in the Last Year: Never true    Ran Out of Food in the Last Year: Never true  Transportation Needs: No Transportation Needs (10/23/2022)   PRAPARE - Administrator, Civil Service (Medical): No    Lack of Transportation (Non-Medical): No  Physical Activity: Not on file  Stress: Not on file  Social Connections: Not on file   Allergies  Allergen Reactions   Tape Itching and Rash    Medications   Medications Prior to Admission  Medication Sig Dispense Refill Last Dose   acetaminophen (TYLENOL) 500 MG tablet Take 500 mg by mouth every 6 (six) hours as needed for moderate pain (pain score 4-6).      ALPRAZolam (XANAX) 0.5 MG tablet Take 0.5 mg by mouth at bedtime as needed for anxiety.      apixaban (ELIQUIS) 5 MG TABS tablet Take 1 tablet (5 mg total) by mouth 2 (two) times daily. 60 tablet 3    bisacodyl (DULCOLAX) 10 MG suppository Place 1 suppository (10 mg total) rectally daily as needed for moderate constipation. 12 suppository 0    cyanocobalamin (,VITAMIN B-12,) 1000 MCG/ML injection Inject 1,000 mcg into the muscle every 30 (thirty) days.      enalapril (VASOTEC) 5 MG tablet Take 5 mg by mouth daily.      famotidine (PEPCID) 20 MG tablet Take 20 mg by mouth 2 (two) times daily as needed.      fenofibrate micronized (LOFIBRA) 134 MG capsule Take 134 mg by mouth daily before breakfast.      fexofenadine (ALLEGRA) 180 MG tablet Take 180 mg by mouth daily as needed for allergies or rhinitis.      glimepiride (AMARYL) 4 MG tablet Take 4 mg by mouth 2 (two) times daily.      insulin glargine (LANTUS) 100 UNIT/ML injection Inject 40 Units into the skin at bedtime as needed (high blood sugar).      levothyroxine (SYNTHROID,  LEVOTHROID) 137 MCG tablet Take 137 mcg by mouth daily before breakfast.      lidocaine-prilocaine (EMLA) cream Apply to affected area once 30 g 3    magnesium oxide (MAG-OX) 400 (240 Mg) MG tablet Take 1 tablet (  400 mg total) by mouth 2 (two) times daily. 60 tablet 6    metFORMIN (GLUCOPHAGE) 500 MG tablet Take 500 mg by mouth 2 (two) times daily with a meal.      Omega-3 Fatty Acids (FISH OIL) 1200 MG CAPS Take 1 capsule by mouth daily.       ONETOUCH VERIO test strip 3 (three) times daily.      oxyCODONE (ROXICODONE) 5 MG immediate release tablet Take 1 tablet (5 mg total) by mouth every 6 (six) hours as needed for severe pain (pain score 7-10). 120 tablet 0    pantoprazole (PROTONIX) 40 MG tablet Take 1 tablet (40 mg total) by mouth daily. 30 tablet 0    polyethylene glycol (MIRALAX / GLYCOLAX) 17 g packet Take 17 g by mouth daily. 14 each 0    prochlorperazine (COMPAZINE) 10 MG tablet Take 1 tablet (10 mg total) by mouth every 6 (six) hours as needed (Nausea or vomiting). 60 tablet 3    rosuvastatin (CRESTOR) 10 MG tablet Take 10 mg by mouth once a week.      triamcinolone cream (KENALOG) 0.1 % Apply 1 Application topically 2 (two) times daily. 30 g 0      Vitals   Current vital signs: BP (!) 155/78   Pulse (!) 55   Temp (!) 97.4 F (36.3 C)   Resp 15   Ht 5' 9.5" (1.765 m)   Wt 84.1 kg   SpO2 96%   BMI 26.99 kg/m  Vital signs in last 24 hours: Temp:  [97.4 F (36.3 C)-98 F (36.7 C)] 97.4 F (36.3 C) (11/28 0030) Pulse Rate:  [52-64] 55 (11/28 0050) Resp:  [15-19] 15 (11/28 0050) BP: (129-174)/(62-78) 155/78 (11/28 0050) SpO2:  [94 %-100 %] 96 % (11/28 0050) Weight:  [84.1 kg] 84.1 kg (11/28 0019)     Body mass index is 26.99 kg/m.  Physical Exam   Constitutional: Appears ill, fatigued Psych: Affect flat, cooperative Eyes: No scleral injection.  Keeps left eye preferentially closed HENT: No OP obstruction.  Head: Normocephalic.  Cardiovascular: Mildly  bradycardic in the 50s, atrial fibrillation on the monitor Respiratory: Effort normal, non-labored breathing.  GI: Soft.  No distension. There is no tenderness.  Skin: WDI.   Neurologic Examination    Neuro: Mental Status: Patient is awake, alert, oriented to person, place, month, year, and situation. Patient is able to give a clear and coherent history. Patient identifies her own left hand as the examiner's Cranial Nerves: II: Visual Fields are notable for a left hemianopia. Pupils are equal, round, and reactive to light.   III,IV, VI: Left eye preferentially closed.  When examiner opens the patient's left eye it is noted to be medially deviated with some torsional nystagmus V: Facial sensation is decreased on the left side VII: Facial movement is notable for marked left facial droop VIII: hearing is intact to voice XII: tongue is midline without atrophy or fasciculations.  Motor: No movement of the left upper extremity to noxious stimulation, no drift of the right upper extremity.  Left lower extremity withdraws from light tickle but does not move antigravity.  Right lower extremity slight drift Sensory: Insensate to touch on the left arm Deep Tendon Reflexes: 3+ right biceps and patella, 2+ left biceps and patella Cerebellar: Finger-nose and heel-to-shin intact on the right   Labs   CBC:  Recent Labs  Lab 12/04/22 0815 12/05/22 2340  WBC 12.4* 22.2*  NEUTROABS 10.0* 20.2*  HGB 11.8* 11.6*  HCT 36.0 36.2  MCV 88.0 89.8  PLT 267 322    Basic Metabolic Panel:  Lab Results  Component Value Date   NA 132 (L) 12/05/2022   K 4.8 12/05/2022   CO2 21 (L) 12/05/2022   GLUCOSE 336 (H) 12/05/2022   BUN 33 (H) 12/05/2022   CREATININE 1.08 (H) 12/05/2022   CALCIUM 9.1 12/05/2022   GFRNONAA 52 (L) 12/05/2022   Lipid Panel: No results found for: "LDLCALC" HgbA1c:  Lab Results  Component Value Date   HGBA1C 8.8 (H) 10/22/2022   Urine Drug Screen: No results found for:  "LABOPIA", "COCAINSCRNUR", "LABBENZ", "AMPHETMU", "THCU", "LABBARB"  Alcohol Level     Component Value Date/Time   ETH <10 12/05/2022 2340   INR  Lab Results  Component Value Date   INR 1.7 (H) 12/05/2022   APTT  Lab Results  Component Value Date   APTT 34 12/05/2022     CT Head without contrast(Personally reviewed): 1. 2.3 x 1.4 x 2.8 cm acute intraparenchymal hemorrhage centered at the right thalamus (estimated volume 5 mL). Associated intraventricular extension with small volume blood in the right lateral ventricle. No hydrocephalus or trapping. 2. Underlying age-related cerebral atrophy with chronic small vessel ischemic disease.   Impression   Erin Hoffman is a 77 y.o. female with a past medical history significant for atrial fibrillation on Eliquis, hypertension, hyperlipidemia, diabetes, cholangiocarcinoma of the liver on chemotherapy, presenting with right subcortical ICH consistent with hypertensive bleed in the setting of Eliquis use  Recommendations   # Hemorrhagic stroke, likely hypertensive - S/p Andexxa at [facility] for Eliquis reversal  - Stroke labs HgbA1c, fasting lipid panel - MRI brain w/ and w/o when stabilized to eval for underlying mass (to be ordered) - Given clinical decline since telespecialist evaluation, repeat Head CT now, with CTA head and neck for vascular imaging and to assess for spot sign - Frequent neuro checks, q1hr  - Echocardiogram - No antiplatelets due to ICH - Hold Eliquis due to ICH - DVT PPx heparin at 24 hrs if stable, SCDs for now - Risk factor modification - Telemetry monitoring - Blood pressure goal SBP 130 - 150  - PT consult, OT consult, Speech consult when patient stabilized  - Admission [facility] at [facility]  # Diabetes history - Low dose sliding scale insulin for now, adjust as needed  # Intrahepatic cholangiocarcinoma - Continue outpatient follow-up - Continue steroid cream for  rash if needed - Consider discussion with oncology team as patient's change in functional status may affect her eligibility for further chemo which may impact goals of care discussion  # Hypomagnesemia -Normal on admission, trend and replete as needed  # Fall and left hip pain - Hip x-ray - CT C-spine negative for fracture  # Goals of care  - Patient's prior CODE STATUS has been full code as recently as earlier this month.  However she expressed that she would not want to be on prolonged life support, and [name] agreed with that.  However [name]'s initial response to my CODE STATUS qeury was full CODE STATUS.  At this time we will proceed with full CODE STATUS but they would like to have further discussion with palliative care team; he agreed that this is a reasonable approach.  I provided a clinical update to family and discussion of plan as above ______________________________________________________________________   Nunzio Cory MD-PhD [facility] [phone]  Available 7 PM to 7 AM, outside of  these hours please call Neurologist on call as listed on Amion.  CRITICAL CARE Performed by: Gordy Councilman   Total critical care time: 45 minutes  Critical care time was exclusive of separately billable procedures and treating other patients.  Critical care was necessary to treat or prevent imminent or life-threatening deterioration.  Critical care was time spent personally by me on the following activities: development of treatment plan with patient and/or surrogate as well as nursing, discussions with consultants, evaluation of patient's response to treatment, examination of patient, obtaining history from patient or surrogate, ordering and performing treatments and interventions, ordering and review of laboratory studies, ordering and review of radiographic studies, pulse oximetry and re-evaluation of patient's condition.

## 2022-12-06 NOTE — Plan of Care (Signed)
Problem: Coping: Goal: Will verbalize positive feelings about self Outcome: Progressing Goal: Will identify appropriate support needs Outcome: Progressing   Problem: Nutrition: Goal: Risk of aspiration will decrease Outcome: Progressing Goal: Dietary intake will improve Outcome: Progressing

## 2022-12-06 NOTE — Progress Notes (Signed)
SLP Cancellation Note  Patient Details Name: CYRILLA VANWYE MRN: [id number] DOB: 08/20/1945   Cancelled treatment:       Reason Eval/Treat Not Completed: Fatigue/lethargy limiting ability to participate   Claudine Mouton 12/06/2022, 12:15 PM

## 2022-12-06 NOTE — Progress Notes (Addendum)
STROKE TEAM PROGRESS NOTE   BRIEF HPI Ms. ERSULA COMBEST is a 77 y.o. 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 Given Andexxa 900mg  in ED and started on cleviprex for BP control.  She is currently on immunotherapy for her cancer, started in November.  NIH on Admission: 18 mRs: 0 ICH Score: 1 for [facility]  SIGNIFICANT HOSPITAL EVENTS  11/27: Presented to [facility] 11/28: Transferred and Admitted to [facility]  Overnight repeat head CT revealed worsening ICH.  INTERIM HISTORY/SUBJECTIVE  MRI suspicious for slight increase in ICH, small lesion also seen on right parietal bone.  Blood pressure goal decreased to 110-130.  On exam, patient is drowsy and will keep her eyes closed most of the time but does respond to voice and answer questions.  She states she is at [facility] which is understandable since she was brought there last night originally.  Left hemianopia, left facial droop, left hemiparesis with no withdrawal to pain on either extremity.  OBJECTIVE  CBC    Component Value Date/Time   WBC 22.2 (H) 12/05/2022 2340   RBC 4.03 12/05/2022 2340   HGB 11.6 (L) 12/05/2022 2340   HCT 36.2 12/05/2022 2340   PLT 322 12/05/2022 2340   MCV 89.8 12/05/2022 2340   MCH 28.8 12/05/2022 2340   MCHC 32.0 12/05/2022 2340   RDW 15.9 (H) 12/05/2022 2340   LYMPHSABS 1.4 12/05/2022 2340   MONOABS 0.5 12/05/2022 2340   EOSABS 0.0 12/05/2022 2340   BASOSABS 0.1 12/05/2022 2340    BMET    Component Value Date/Time   NA 132 (L) 12/05/2022 2340   K 4.8 12/05/2022 2340   CL 101 12/05/2022 2340   CO2 21 (L) 12/05/2022 2340   GLUCOSE 336 (H) 12/05/2022 2340   BUN 33 (H) 12/05/2022 2340   CREATININE 1.08 (H) 12/05/2022 2340   CALCIUM 9.1 12/05/2022 2340   GFRNONAA 52 (L) 12/05/2022 2340    IMAGING past  24 hours CT ANGIO HEAD NECK W WO CM  Result Date: 12/06/2022 CLINICAL DATA:  Follow-up examination for stroke. EXAM: CT ANGIOGRAPHY HEAD AND NECK WITH AND WITHOUT CONTRAST TECHNIQUE: Multidetector CT imaging of the head and neck was performed using the standard protocol during bolus administration of intravenous contrast. Multiplanar CT image reconstructions and MIPs were obtained to evaluate the vascular anatomy. Carotid stenosis measurements (when applicable) are obtained utilizing NASCET criteria, using the distal internal carotid diameter as the denominator. RADIATION DOSE REDUCTION: This exam was performed according to the departmental dose-optimization program which includes automated exposure control, adjustment of the mA and/or kV according to patient size and/or use of iterative reconstruction technique. CONTRAST:  75mL OMNIPAQUE IOHEXOL 350 MG/ML SOLN COMPARISON:  CT from earlier the same day. FINDINGS: CTA NECK FINDINGS Aortic arch: Aortic arch within normal limits for caliber with standard 3 vessel morphology. Mild aortic atherosclerosis. No stenosis about the origin the great vessels. Right carotid system: Right common and internal carotid arteries are patent without dissection. Mild atheromatous change about the right carotid bulb without hemodynamically significant stenosis. Left carotid system: Left common and internal carotid arteries are patent without dissection. Mild atheromatous change about the left carotid bulb without hemodynamically significant stenosis. Vertebral arteries: Both vertebral arteries arise from subclavian arteries. Right vertebral artery dominant. Vertebral arteries patent without stenosis or dissection.  Skeleton: No discrete or worrisome osseous lesions. Moderate spondylosis present at C5-6. Other neck: No other acute finding. Upper chest: Emphysema. 3 mm subpleural pulmonary nodule present at the left upper lobe (series 5, image 1). This was not FDG avid on prior PET-CT.  Pleural based metastasis involving the posterior right lung is partially visualized, but appears increased in size now measuring up to approximately 3.2 cm. Invasion of the posterior right 6 rib and right T6 transverse process. Associated right pleural effusion. Right-sided Port-A-Cath in place. Review of the MIP images confirms the above findings CTA HEAD FINDINGS Anterior circulation: Atheromatous change seen about the carotid siphons without hemodynamically significant stenosis. A1 segments patent bilaterally. Normal anterior communicating artery complex. Anterior cerebral arteries are patent without stenosis. No M1 stenosis or occlusion. Distal MCA branches perfused and symmetric. Posterior circulation: Both V4 segments patent without significant stenosis. Both PICA patent. Basilar patent without stenosis. Superior cerebellar and posterior cerebral arteries patent bilaterally. Venous sinuses: Grossly patent allowing for timing the contrast bolus. Anatomic variants: None significant. No vascular malformation seen underlying the right thalamic hemorrhage. No spot sign. Since CT performed earlier the same day. The hemorrhage has increased in size now measuring 2.1 x 2.6 x 3.3 cm (estimated volume 9 mL, previously 5 mL). Surrounding edema has somewhat worsened. Trace right-to-left shift is seen. Intraventricular extension again noted with blood in the adjacent right lateral ventricle, also increased. No hydrocephalus or trapping. Review of the MIP images confirms the above findings IMPRESSION: 1. Negative CTA for large vessel occlusion or other emergent finding. No vascular malformation seen underlying the right thalamic hemorrhage. 2. Interval increase in size of right thalamic hemorrhage now measuring 2.1 x 2.6 x 3.3 cm (estimated volume 9 mL, previously 5 mL). Surrounding edema has mildly worsened with trace right-to-left shift now seen. Intraventricular extension has also mildly worsened. No hydrocephalus or  trapping. 3. Pleural based metastasis involving the posterior right lung, partially visualized, but appears increased in size from most recent PET-CT now measuring up to approximately 3.2 cm. Invasion of the posterior right sixth rib and right T6 transverse process. Associated right pleural effusion. 4. Aortic Atherosclerosis (ICD10-I70.0) and Emphysema (ICD10-J43.9). Electronically Signed   By: Rise Mu M.D.   On: 12/06/2022 05:02   CT Cervical Spine Wo Contrast  Result Date: 12/06/2022 CLINICAL DATA:  Left-sided weakness and neck trauma EXAM: CT CERVICAL SPINE WITHOUT CONTRAST TECHNIQUE: Multidetector CT imaging of the cervical spine was performed without intravenous contrast. Multiplanar CT image reconstructions were also generated. RADIATION DOSE REDUCTION: This exam was performed according to the departmental dose-optimization program which includes automated exposure control, adjustment of the mA and/or kV according to patient size and/or use of iterative reconstruction technique. COMPARISON:  None are available FINDINGS: Alignment: No evidence of traumatic malalignment. Skull base and vertebrae: No acute fracture. Soft tissues and spinal canal: No prevertebral fluid or swelling. No visible canal hematoma. Disc levels: Multilevel spondylosis, disc space height loss, and degenerative endplate changes greatest at C5-C6 where it is moderate. Multilevel moderate to advanced facet arthropathy. Spinal canal narrowing is greatest at C3-C4 and C5-C6 where it is mild secondary to posterior disc osteophyte complexes. Upper chest: No acute abnormality. Other: Mild carotid atherosclerotic calcification. IMPRESSION: No acute fracture in the cervical spine. Electronically Signed   By: Minerva Fester M.D.   On: 12/06/2022 00:36   CT HEAD CODE STROKE WO CONTRAST  Result Date: 12/06/2022 CLINICAL DATA:  Code stroke. Initial evaluation for neuro deficit, stroke. EXAM: CT HEAD  WITHOUT CONTRAST TECHNIQUE:  Contiguous axial images were obtained from the base of the skull through the vertex without intravenous contrast. RADIATION DOSE REDUCTION: This exam was performed according to the departmental dose-optimization program which includes automated exposure control, adjustment of the mA and/or kV according to patient size and/or use of iterative reconstruction technique. COMPARISON:  None Available. FINDINGS: Brain: Age-related cerebral atrophy with chronic small vessel ischemic disease. Acute intraparenchymal hemorrhage centered at the right thalamus measures 2.3 x 1.4 x 2.8 cm (estimated volume 5 mL). Mild localized edema without significant regional mass effect. Associated intraventricular extension with small volume blood in the right lateral ventricle. No hydrocephalus or trapping. No other acute intracranial hemorrhage. No other acute large vessel territory infarct. No mass lesion or midline shift. No hydrocephalus or extra-axial fluid collection. Vascular: No abnormal hyperdense vessel. Scattered vascular calcifications noted within the carotid siphons. Skull: Scalp soft tissues within normal limits.  Calvarium intact. Sinuses/Orbits: Globes and orbital soft tissues within normal limits. Scattered mucosal thickening about the ethmoidal air cells. Paranasal sinuses are otherwise clear. No mastoid effusion. Other: 9. ASPECTS ([place] Stroke Program Early CT Score) Acute ICH, does not apply. IMPRESSION: 1. 2.3 x 1.4 x 2.8 cm acute intraparenchymal hemorrhage centered at the right thalamus (estimated volume 5 mL). Associated intraventricular extension with small volume blood in the right lateral ventricle. No hydrocephalus or trapping. 2. Underlying age-related cerebral atrophy with chronic small vessel ischemic disease. Critical Value/emergent results were called by telephone at the time of interpretation on 12/06/2022 at 12:11 am to provider [facility] , who verbally acknowledged these results.  Electronically Signed   By: Rise Mu M.D.   On: 12/06/2022 00:14    Vitals:   12/06/22 0700 12/06/22 0715 12/06/22 0730 12/06/22 0800  BP: (!) 134/59 (!) 117/59 (!) 123/51   Pulse: 60 (!) 54 (!) 56   Resp: 20 18 18    Temp: 98.1 F (36.7 C)   98.1 F (36.7 C)  TempSrc: Oral   Axillary  SpO2: 96% 95% 94%   Weight:      Height:         PHYSICAL EXAM General:  Alert, well-nourished, well-developed patient in no acute distress Psych:  Mood and affect appropriate for situation CV: Regular rate and rhythm on monitor Respiratory:  Regular, unlabored respirations on room air GI: Abdomen soft and nontender   NEURO:  Mental Status: Drowsy but awakens to voice.  States she is at [employer].  Correctly states her name age.  Disoriented to month Speech/Language: No aphasia present.  Dysarthria present Left neglect present  Cranial Nerves:  II: PERRL.  Left hemianopia III, IV, VI: With forced eye opening, left eye is medially deviated V: Facial sensation decreased on left. VII: Prominent left facial droop. VIII: hearing intact to voice. IX, X: Palate elevates symmetrically. Phonation is normal.  ZO:[id number] shrug weak but symmetric XII: tongue is midline Motor:  Left hemiparesis present. Right arm and right leg without drift. Tone: is normal and bulk is normal Sensation-sensation is decreased on left arm and left leg. Coordination: FTN intact on right unable to perform on left Gait- deferred  Most Recent NIH 18    ASSESSMENT/PLAN  ICH:  right thalamus ICH s/p Eliquis reversal with Andexxa, etiology:  likely hypertensive bleed in the setting of Eliquis use Code Stroke CT head: 2.3 x 1.4 x 2.8 cm acute intraparenchymal hemorrhage centered at the right thalamus (estimated volume 5 mL). Associated intraventricular extension with small volume blood in the  right lateral ventricle.  Repeat CT 0300 11/27: Interval increase of Right Thalamic ICH, increased from 5ml to 9ml,  increased edema with new trace right-to-left shift. CT Angio head and neck: No LVO CT C-spine: No fracture MRI w/ and w/o: Stable ICH comparison with repeat CT.  CT repeat in a.m. pending 2D Echo: LVEF 60 to 65% no shunt LDL pending HgbA1c 8.8 in 16/1096 VTE prophylaxis - SCDs. Eliquis  prior to admission, now on No antithrombotic due to ICH Therapy recommendations:  Pending Disposition:  pending, palliative on board  Cholangiocarcinoma with metastasis CTA neck Pleural-based metastasis posterior right lung, increased in size from most recent PET-CT with invasion of posterior right 6th rib and right T6 tranverse process and associated right pleural effusion On immunotherapy Pain management MRI brain with contrast Small lesion on right parietal bone.  Recommend follow-up brain MRI in 3 months  Atrial fibrillation Home Meds: eliquis Eliquis reversed with Andexxa Currently rate controlled HOLD anticoagulation due to ICH  Hypertension Home meds:  vasotec Stable Currently on Cleviprex, wean as tolerated BP goal lowered to 120-140 due to suspicion for increase in ICH  Hyperlipidemia Home meds:  Crestor 10mg  LDL pending, goal < 70 Consider restart statin at discharge  Diabetes type II Uncontrolled Home meds:  metformin, lantus HgbA1c 8.8 in 10/2022, goal < 7.0 CBGs SSI Recommend close follow-up with PCP for better DM control  Dysphagia Patient has post-stroke dysphagia SLP consulted Now on clear liquid and think liquid Advance diet as able  Other Stroke Risk Factors Advanced age Former smoker  Other Active Problems Anxiety - Xanax home med Thyroid Disease Synthroid home medication  Hospital day # 0   Pt seen by Neuro NP/[facility] and later by MD. Note/plan to be edited by MD as needed.    Lynnae January, DNP, AGACNP-BC [facility] Please use AMION for contact information & EPIC for messaging.  ATTENDING NOTE: I reviewed above note and agree with the  assessment and plan. Pt was seen and examined.   No family at bedside.  Patient lying in bed, eyes closed, able to open on request, orientated to age, place, time and people. No aphasia, fluent language, following all simple commands. Able to name and repeat.  Mild dysarthria.  No gaze palsy, tracking bilaterally, left lower quadrantanopsia, PERRL.  Left facial droop. Tongue midline.  Right upper and lower extremity 5/5, no drift.  However, left upper extremity flaccid, left lower extremity intermittent involuntary movement but not against gravity.  Sensation diminished on the left, right FTN intact, gait not tested.   Patient admitted for right thalamic ICH, repeat CT showed interval enlargement of ICH and extension of IVH, keep BP goal 1 20-1 40.  MRI showed stable ICH, but will repeat CT in a.m. to further document stability.  On low-dose Cleviprex, taper off as able.  Passed swallow, however lethargic, put on liquid diet.  Hold statin for now, may restart at discharge.  Palliative care on board given advanced malignancy.  For detailed assessment and plan, please refer to above/below as I have made changes wherever appropriate.   Marvel Plan, MD PhD Stroke Neurology 12/06/2022 4:47 PM  This patient is critically ill due to ICH, A-fib on Eliquis, interval increased ICH with IVH and at significant risk of neurological worsening, death form hematoma expansion, brain herniation, heart failure, stroke, hydrocephalus. This patient's care requires constant monitoring of vital signs, hemodynamics, respiratory and cardiac monitoring, review of multiple databases, neurological assessment, discussion with family, other specialists  and medical decision making of high complexity. I spent 35 minutes of neurocritical care time in the care of this patient.     To contact Stroke Continuity provider, please refer to [email or web address]. After hours, contact General Neurology

## 2022-12-06 NOTE — Consult Note (Signed)
[facility] Tele[facility]urology Consult Services   Patient Name:   Erin Hoffman, Erin Hoffman Date of Birth:   01/07/1946 Identification Number:   MRN - [id number] Date of Service:   12/06/2022 00:21:39  Diagnosis:       I61.9 - Intracerebral haemorrhage, unspecified  Impression: 77 yo woman presenting with left weakness and found to have a right thalamic hemorrhage. She is set to be transferred to another hospital and placed in ICU. Agree with BP control, goal less than 140 systolic. Agree with eliquis reversal.  Recommendation:  Medications:       Hold?antiplatelet?therapy/NSAIDS/Anticoagulation       Warfarin/Coumadin/DOAC reversal per hospital protocol  Consultations:       Recommend Speech therapy if failed dysphagia screen       Physical therapy/Occupational therapy  DVT Prophylaxis:       SCDs  Disposition:       Neurology will Follow  ------------------------------------------------------------------------------  Metrics: Last Known Well: 12/05/2022 23:15:00 Dispatch Time: 12/06/2022 00:21:39 Arrival Time: 12/05/2022 23:50:00 Initial Response Time: 12/06/2022 00:24:38 Symptoms: left weak. Initial patient interaction: 12/06/2022 00:31:43 NIHSS Assessment Completed: 12/06/2022 00:42:06 Patient is not a candidate for Thrombolytic. Thrombolytic Medical Decision: 12/06/2022 00:42:08 Patient was not deemed candidate for Thrombolytic because of following reasons: History of previous intracranial hemorrhage, intracranial neoplasm .  I personally Reviewed the CT Head  Primary Provider Notified of Diagnostic Impression and Management Plan on: 12/06/2022 01:00:15    History of Present Illness: Patient is a 77 year old Female.  Patient was brought by EMS for symptoms of left weak. This is a 77 yo woman who presents after being found on the ground by her neighbor. Not long after 2300, she became weak and fell to the ground. She has significant left sided  weakness. Per report, she has a history of recently diagnosed liver cancer with bone mets, on chemotherapy.  She has a history of afib on eliquis. Head CT obtained tonight showed a right thalamic hemorrhage. She has been given andexxa. BP initially in the 170s systolic but has improved with labetalol.       Past Medical History:      Hypertension      Diabetes Mellitus      Atrial Fibrillation  Medications:  Anticoagulant use:  Yes eliquis No Antiplatelet use Reviewed EMR for current medications  Allergies:  Reviewed  Social History: Smoking: No  Family History:  There is no family history of premature cerebrovascular disease pertinent to this consultation  ROS : 14 Points Review of Systems was performed and was negative except mentioned in HPI.  Past Surgical History: There Is No Surgical History Contributory To Today's Visit     Examination: BP(129/62), Pulse(54), 1A: Level of Consciousness - Alert; keenly responsive + 0 1B: Ask Month and Age - Both Questions Right + 0 1C: Blink Eyes & Squeeze Hands - Performs Both Tasks + 0 2: Test Horizontal Extraocular Movements - Normal + 0 3: Test Visual Fields - No Visual Loss + 0 4: Test Facial Palsy (Use Grimace if Obtunded) - Normal symmetry + 0 5A: Test Left Arm Motor Drift - No Movement + 4 5B: Test Right Arm Motor Drift - No Drift for 10 Seconds + 0 6A: Test Left Leg Motor Drift - No Movement + 4 6B: Test Right Leg Motor Drift - No Drift for 5 Seconds + 0 7: Test Limb Ataxia (FNF/Heel-Shin) - No Ataxia + 0 8: Test Sensation - Complete Loss: Cannot Sense Being Touched At All + 2  9: Test Language/Aphasia - Normal; No aphasia + 0 10: Test Dysarthria - Normal + 0 11: Test Extinction/Inattention - No abnormality + 0 NIHSS Score: 10  ICH Score: 0   GlasGow Coma Score: 13-15 (0)  Age >= 80: No (0)  ICH volume >= 30mL: No (0)  Intraventricular hemorrhage: No (0)  Infratentorial origin of hemorrhage: No  (0)  Pre-Morbid Modified Rankin Scale: 0 Points = No symptoms at all   This consult was conducted in real time using interactive audio and Immunologist. Patient was informed of the technology being used for this visit and agreed to proceed. Patient located in hospital and provider located at home/office setting.  Due to the immediate potential for life-threatening deterioration due to underlying acute neurologic illness, I spent 25 minutes providing critical care. This time includes time for face to face visit via telemedicine, review of medical records, imaging studies and discussion of findings with providers, the patient and/or family.  Dr Parthenia Ames  [facility] For Inpatient follow-up with [facility] physician please call [facility] at [phone]. As we are not an outpatient service for any post hospital discharge needs please contact the hospital for assistance. If you have any questions for the [facility] physicians or need to reconsult for clinical or diagnostic changes please contact us via [facility] at [phone].

## 2022-12-06 NOTE — Progress Notes (Signed)
Repeat head CT reveals worsened ICH Blood product consent obtained from family 10 units cryoprecipitate ordered  Nursing updated at bedside  Additional 10 min of care

## 2022-12-06 NOTE — Progress Notes (Signed)
2352: Tele stroke cart activated at this time. Pt in CT at time of activation.   0020: Pt returned from CT  0021: TSP paged   0030: Results of NCCT given to TSP.   0043: TSP and TSRN off camera at this time. TSP to follow up with edp for plan of care.

## 2022-12-06 NOTE — Progress Notes (Addendum)
Per Dr Roda Shutters SBP goal 120-140, clevi order updated

## 2022-12-06 NOTE — Consult Note (Signed)
Palliative Medicine Inpatient Consult Note  Consulting Provider:  Gordy Councilman, MD   Reason for consult:   Palliative Care Consult Services Palliative Medicine Consult  Reason for Consult? 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 Presented with left weakness and found to have a right thalamic hemorrhage.  Palliative care consulted for goals of care conversations.  Clinical Assessment/Goals of Care:  *Please note that this is a verbal dictation therefore any spelling or grammatical errors are due to the "Dragon Medical One" system interpretation.  I have reviewed medical records including EPIC notes, labs and imaging, received report from bedside RN, assessed the patient who is lying in bed verbal noted to be on room air, in generalized pain.    I met with patient's son, Erin Hoffman to further discuss diagnosis prognosis, GOC, EOL wishes, disposition and options.   I introduced Palliative Medicine as specialized medical care for people living with serious illness. It focuses on providing relief from the symptoms and stress of a serious illness. The goal is to improve quality of life for both the patient and the family.  Medical History Review and Understanding:  Reviewed patient's past medical history significant for hypertension, diabetes, atrial fibrillation, and cholangiocarcinoma with metastases.  Social History:  Erin Hoffman is from [facility].  She is divorced.  [place] has 3 sons, 3 grandchildren, and 1 great grandchild.  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  She gets joy out of her family.  She is a woman of faith and  practices within the [facility] denomination.  Preceding Erin Hoffman's cancer diagnosis she was helping to care for her aunt.  Functional and Nutritional State:  Rashawna lives in an apartment in [place].  She was taking care of herself and attending to B ADLs.  She did have a good appetite prior to her cancer.  Advance Directives:  A detailed discussion was had today regarding advanced directives.  Patient has no identified advance care directives though her son Erin Hoffman does feel he has an understanding of what her wishes would be.  Code Status:  Concepts specific to code status, artifical feeding and hydration, continued IV antibiotics and rehospitalization was had.  The difference between a aggressive medical intervention path  and a palliative comfort care path for this patient at this time was had.   At this point in time if something such as intubation would result in a long-term improvement in patient's condition family would be open to it but if it were going to leave her in a long-term unresponsive wakefulness  state then family would not desire this.  I shared the importance of talking as a family about patient's current medical situation and also considering if she would want to be intubated and go through the aggressive efforts of resuscitation.  Provided  "Hard Choices for [employer]" booklet.   Discussion:  Patient's son shares with me acceptance that his mothers prognosis may be poor if she continues to have bleeding in her brain.  We reviewed the differences between hemorrhagic strokes and ischemic strokes.  We discussed that hemorrhagic strokes can similarly have long-term lasting effects on patient's physical and cognitive function.  We reviewed one of the great concerns  with any patient who is on anticoagulants is whether or not the bleeding will stop and what the long-term ramifications of that brain injury will be thereafter.  Patient's son Erin Hoffman shares with me that he  understands her cancer was already something which put her at a deficit.  He expresses that she had gotten chemotherapy as it was presented as the only option for her to improve/stabilize her health condition.  He understands that she had only done 1 cycle of 6 and perhaps she will not be able to complete the remainder of the cycles thereafter.  We reviewed at this time that we are still getting diagnostic information, which patient's family would desire.  We reviewed that additional conversations will occur in the oncoming days depending upon patient's improvements or declines.  Discussed the importance of continued conversation with family and their  medical providers regarding overall plan of care and treatment options, ensuring decisions are within the context of the patients values and GOCs.  Decision Maker: Patient has 3 sons who all jointly make decisions for her.  SUMMARY OF RECOMMENDATIONS   Full Code/Full scope of care  Allowing time for outcomes  Plan for MRI brain later today  Ongoing palliative care support  Code Status/Advance Care Planning: FULL CODE   Symptom Management:  Generalized pain: -Can consider Tylenol IV until patient passes her swallow evaluation  Palliative Prophylaxis:  Aspiration, Bowel Regimen, Delirium Protocol, Frequent Pain Assessment, Oral Care, Palliative Wound Care, and Turn Reposition  Additional Recommendations (Limitations, Scope, Preferences): Continue current care  Psycho-social/Spiritual:  Desire for further Chaplaincy support: Yes Additional Recommendations: Education on hemorrhagic stroke   Prognosis: Poor in the setting of underlying cancer burden  Discharge Planning: Discharge plan is uncertain  Vitals:   12/06/22 0715 12/06/22 0730  BP: (!) 117/59 (!) 123/51  Pulse: (!) 54 (!) 56  Resp: 18 18  Temp:    SpO2: 95% 94%    Intake/Output Summary (Last 24 hours) at 12/06/2022 0750 Last data filed at 12/06/2022 0700 Gross per  24 hour  Intake 408.58 ml  Output --  Net 408.58 ml   Last Weight  Most recent update: 12/06/2022 12:21 AM    Weight  84.1 kg (185 lb 6.5 oz)            Gen: Elderly Caucasian female in no acute distress HEENT: Dry mucous membranes CV: Regular rate and rhythm PULM: On room air breathing is even and nonlabored ABD: soft/nontender EXT: Left-sided hemiplegia Neuro: Alert and oriented x2  PPS: 10%   This conversation/these recommendations were discussed with patient primary care team, Dr. Roda Shutters  Billing based on MDM: High  Problems Addressed: One acute or chronic illness or injury that poses a threat to life or bodily function  Amount and/or Complexity of Data: Category 3:Discussion of management or test interpretation with external physician/other qualified health care professional/appropriate source (not separately reported)  Risks: Decision regarding hospitalization or escalation of hospital care and Decision not to resuscitate or to de-escalate care because of poor prognosis ______________________________________________________ Lamarr Lulas [place] Palliative Medicine Team Team Cell Phone: [phone] Please utilize secure chat with additional questions, if there is no response within 30 minutes please call the above phone number  Palliative Medicine Team providers are available by phone from 7am to 7pm daily and can be reached through the team cell phone.  Should this patient require assistance outside of these hours, please call the patient's attending physician.

## 2022-12-06 NOTE — Progress Notes (Signed)
eLink Physician-Brief Progress Note Patient Name: Erin Hoffman DOB: 1945-05-26 MRN: [id number]   Date of Service  12/06/2022  HPI/Events of Note  Patient on Eliquis for atrial fibrillation admitted through the ED as a Code Stroke but found on imaging to have a right thalamic ICH with intra-ventricular extension, Eliquis was reversed and patient is on Cleviprex for blood pressure control.  eICU Interventions  New patient Evaluation.        Thomasene Lot Helena Sardo 12/06/2022, 2:34 AM

## 2022-12-07 ENCOUNTER — Inpatient Hospital Stay (HOSPITAL_COMMUNITY): Payer: HMO

## 2022-12-07 ENCOUNTER — Other Ambulatory Visit: Payer: Self-pay

## 2022-12-07 DIAGNOSIS — E119 Type 2 diabetes mellitus without complications: Secondary | ICD-10-CM | POA: Diagnosis not present

## 2022-12-07 DIAGNOSIS — Z515 Encounter for palliative care: Secondary | ICD-10-CM | POA: Diagnosis not present

## 2022-12-07 DIAGNOSIS — R21 Rash and other nonspecific skin eruption: Secondary | ICD-10-CM | POA: Diagnosis not present

## 2022-12-07 DIAGNOSIS — I610 Nontraumatic intracerebral hemorrhage in hemisphere, subcortical: Secondary | ICD-10-CM | POA: Diagnosis not present

## 2022-12-07 DIAGNOSIS — Z7189 Other specified counseling: Secondary | ICD-10-CM | POA: Diagnosis not present

## 2022-12-07 LAB — COMPREHENSIVE METABOLIC PANEL
ALT: 45 U/L — ABNORMAL HIGH (ref 0–44)
AST: 84 U/L — ABNORMAL HIGH (ref 15–41)
Albumin: 2.9 g/dL — ABNORMAL LOW (ref 3.5–5.0)
Alkaline Phosphatase: 109 U/L (ref 38–126)
Anion gap: 10 (ref 5–15)
BUN: 20 mg/dL (ref 8–23)
CO2: 20 mmol/L — ABNORMAL LOW (ref 22–32)
Calcium: 8.8 mg/dL — ABNORMAL LOW (ref 8.9–10.3)
Chloride: 108 mmol/L (ref 98–111)
Creatinine, Ser: 0.83 mg/dL (ref 0.44–1.00)
GFR, Estimated: >60 mL/min (ref >60)
Glucose, Bld: 90 mg/dL (ref 70–99)
Potassium: 4 mmol/L (ref 3.5–5.1)
Sodium: 138 mmol/L (ref 135–145)
Total Bilirubin: 1.1 mg/dL (ref <1.2)
Total Protein: 5.7 g/dL — ABNORMAL LOW (ref 6.5–8.1)

## 2022-12-07 LAB — BPAM CRYOPRECIPITATE
Blood Product Expiration Date: 202412012359
Blood Product Expiration Date: 202412032359
ISSUE DATE / TIME: 202411280606
ISSUE DATE / TIME: 202411280606
Unit Type and Rh: 5100
Unit Type and Rh: 5100

## 2022-12-07 LAB — LIPID PANEL
Cholesterol: 175 mg/dL (ref 0–200)
HDL: 20 mg/dL — ABNORMAL LOW (ref >40)
LDL Cholesterol: 123 mg/dL — ABNORMAL HIGH (ref 0–99)
Total CHOL/HDL Ratio: 8.8 {ratio}
Triglycerides: 158 mg/dL — ABNORMAL HIGH (ref <150)
VLDL: 32 mg/dL (ref 0–40)

## 2022-12-07 LAB — CBC WITH DIFFERENTIAL/PLATELET
Abs Immature Granulocytes: 1.7 10*3/uL — ABNORMAL HIGH (ref 0.00–0.07)
Basophils Absolute: 0.1 10*3/uL (ref 0.0–0.1)
Basophils Relative: 1 %
Eosinophils Absolute: 0.1 10*3/uL (ref 0.0–0.5)
Eosinophils Relative: 0 %
HCT: 32.3 % — ABNORMAL LOW (ref 36.0–46.0)
Hemoglobin: 10.9 g/dL — ABNORMAL LOW (ref 12.0–15.0)
Immature Granulocytes: 8 %
Lymphocytes Relative: 6 %
Lymphs Abs: 1.3 10*3/uL (ref 0.7–4.0)
MCH: 29.5 pg (ref 26.0–34.0)
MCHC: 33.7 g/dL (ref 30.0–36.0)
MCV: 87.3 fL (ref 80.0–100.0)
Monocytes Absolute: 0.1 10*3/uL (ref 0.1–1.0)
Monocytes Relative: 1 %
Neutro Abs: 18.5 10*3/uL — ABNORMAL HIGH (ref 1.7–7.7)
Neutrophils Relative %: 84 %
Platelets: 284 10*3/uL (ref 150–400)
RBC: 3.7 MIL/uL — ABNORMAL LOW (ref 3.87–5.11)
RDW: 16.2 % — ABNORMAL HIGH (ref 11.5–15.5)
Smear Review: NORMAL
WBC: 21.8 10*3/uL — ABNORMAL HIGH (ref 4.0–10.5)
nRBC: 0 % (ref 0.0–0.2)

## 2022-12-07 LAB — GLUCOSE, CAPILLARY
Glucose-Capillary: 79 mg/dL (ref 70–99)
Glucose-Capillary: 81 mg/dL (ref 70–99)
Glucose-Capillary: 151 mg/dL — ABNORMAL HIGH (ref 70–99)
Glucose-Capillary: 192 mg/dL — ABNORMAL HIGH (ref 70–99)
Glucose-Capillary: 149 mg/dL — ABNORMAL HIGH (ref 70–99)

## 2022-12-07 LAB — PREPARE CRYOPRECIPITATE
Unit division: 0
Unit division: 0

## 2022-12-07 LAB — MAGNESIUM: Magnesium: 1.7 mg/dL (ref 1.7–2.4)

## 2022-12-07 MED ORDER — MAGNESIUM SULFATE 2 GM/50ML IV SOLN
2.0000 g | Freq: Once | INTRAVENOUS | Status: AC
  Administered 2022-12-07: 2 g via INTRAVENOUS
  Filled 2022-12-07: qty 50

## 2022-12-07 MED ORDER — LABETALOL HCL 5 MG/ML IV SOLN
10.0000 mg | INTRAVENOUS | Status: DC | PRN
  Administered 2022-12-07: 10 mg via INTRAVENOUS
  Administered 2022-12-08: 20 mg via INTRAVENOUS
  Filled 2022-12-07 (×2): qty 4

## 2022-12-07 MED ORDER — OXYCODONE HCL 5 MG PO TABS
5.0000 mg | ORAL_TABLET | Freq: Four times a day (QID) | ORAL | Status: DC | PRN
  Administered 2022-12-07 – 2022-12-08 (×3): 5 mg via ORAL
  Filled 2022-12-07 (×3): qty 1

## 2022-12-07 MED ORDER — LEVOTHYROXINE SODIUM 137 MCG PO TABS
137.0000 ug | ORAL_TABLET | Freq: Every day | ORAL | Status: DC
  Administered 2022-12-08 – 2022-12-19 (×12): 137 ug via ORAL
  Filled 2022-12-07 (×13): qty 1

## 2022-12-07 MED ORDER — AMLODIPINE BESYLATE 10 MG PO TABS
10.0000 mg | ORAL_TABLET | Freq: Every day | ORAL | Status: DC
  Administered 2022-12-07 – 2022-12-19 (×13): 10 mg via ORAL
  Filled 2022-12-07 (×13): qty 1

## 2022-12-07 MED ORDER — PANTOPRAZOLE SODIUM 40 MG PO TBEC
40.0000 mg | DELAYED_RELEASE_TABLET | Freq: Every day | ORAL | Status: DC
  Administered 2022-12-07 – 2022-12-19 (×13): 40 mg via ORAL
  Filled 2022-12-07 (×13): qty 1

## 2022-12-07 MED ORDER — ENALAPRIL MALEATE 10 MG PO TABS
10.0000 mg | ORAL_TABLET | Freq: Every day | ORAL | Status: DC
  Administered 2022-12-08 – 2022-12-19 (×12): 10 mg via ORAL
  Filled 2022-12-07 (×12): qty 1

## 2022-12-07 MED ORDER — ENALAPRIL MALEATE 2.5 MG PO TABS
5.0000 mg | ORAL_TABLET | Freq: Every day | ORAL | Status: DC
  Administered 2022-12-07: 5 mg via ORAL
  Filled 2022-12-07: qty 2

## 2022-12-07 NOTE — Progress Notes (Addendum)
STROKE TEAM PROGRESS NOTE   BRIEF HPI [name] is a 77 y.o. 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 Given Andexxa 900mg  in ED and started on cleviprex for BP control.  She is currently on immunotherapy for her cancer, started in November.  NIH on Admission: 18 mRs: 0 ICH Score: 1 for [facility]  SIGNIFICANT HOSPITAL EVENTS  11/27: Presented to [facility] 11/28: Transferred and Admitted to [facility]  Overnight repeat head CT revealed worsening ICH.  INTERIM HISTORY/SUBJECTIVE Repeat CT head this morning with slight increase in hemorrhage with a slight increase in leftward midline shift  LDL 123 White count 21.8.  Febrile will check UA On exam she is more awake this morning will attempt to advance her diet lower to work with therapy today.  Will increase her blood pressure goal to less than 160 and start her home enalapril Possibly transfer her out of the ICU this afternoon  OBJECTIVE  CBC    Component Value Date/Time   WBC 21.8 (H) 12/07/2022 0614   RBC 3.70 (L) 12/07/2022 0614   HGB 10.9 (L) 12/07/2022 0614   HCT 32.3 (L) 12/07/2022 0614   PLT 284 12/07/2022 0614   MCV 87.3 12/07/2022 0614   MCH 29.5 12/07/2022 0614   MCHC 33.7 12/07/2022 0614   RDW 16.2 (H) 12/07/2022 0614   LYMPHSABS 1.3 12/07/2022 0614   MONOABS 0.1 12/07/2022 0614   EOSABS 0.1 12/07/2022 0614   BASOSABS 0.1 12/07/2022 0614    BMET    Component Value Date/Time   NA 138 12/07/2022 0614   K 4.0 12/07/2022 0614   CL 108 12/07/2022 0614   CO2 20 (L) 12/07/2022 0614   GLUCOSE 90 12/07/2022 0614   BUN 20 12/07/2022 0614   CREATININE 0.83 12/07/2022 0614   CALCIUM 8.8 (L) 12/07/2022 0614   GFRNONAA >60 12/07/2022 0614    IMAGING past 24 hours CT HEAD WO CONTRAST ( )  Result Date:  12/07/2022 CLINICAL DATA:  Stroke, hemorrhagic. EXAM: CT HEAD WITHOUT CONTRAST TECHNIQUE: Contiguous axial images were obtained from the base of the skull through the vertex without intravenous contrast. RADIATION DOSE REDUCTION: This exam was performed according to the departmental dose-optimization program which includes automated exposure control, adjustment of the mA and/or kV according to patient size and/or use of iterative reconstruction technique. COMPARISON:  None Available. FINDINGS: Brain: Mild interval increase in size of an intraparenchymal hemorrhage centered in the right thalamus which now measures up to 2.9 x 2.7 x 2.1 cm on reformatted imaging (previously 2.8 x 2.4 x 2.0 cm when remeasured similarly on the prior). Slight interval increase in leftward midline shift (up to 3 mm). Mild interval increase in small volume intraventricular hemorrhage. No evidence of acute large vascular territory infarct, visible mass lesion or hydrocephalus. Vascular: No hyperdense vessel identified. Skull: No acute fracture. Sinuses/Orbits: Clear sinuses.  No acute orbital findings. IMPRESSION: 1. Mild interval increase in size of an intraparenchymal hemorrhage centered in the right thalamus which now measures up to 2.9 x 2.7 x 2.1 cm (previously 2.8 x 2.4 x 2.0 cm when remeasured similarly on the prior). 2. Slight increase in leftward midline shift (up to 3 mm). 3. Mild interval increase in small volume intraventricular hemorrhage. These results will be called to the ordering clinician or representative by the  Printmaker, and communication documented in the PACS or [employer]. Electronically Signed   By: Feliberto Harts M.D.   On: 12/07/2022 08:44    Vitals:   12/07/22 0845 12/07/22 0900 12/07/22 0915 12/07/22 0930  BP: (!) 146/59 (!) 124/44 (!) 108/50 (!) 109/44  Pulse: 64 (!) 52 (!) 57 (!) 55  Resp: (!) 22 17 19 16   Temp:      TempSrc:      SpO2: 96% 94% 94% 93%  Weight:      Height:          PHYSICAL EXAM General:  Alert, well-nourished, well-developed patient in no acute distress Psych:  Mood and affect appropriate for situation CV: Regular rate and rhythm on monitor Respiratory:  Regular, unlabored respirations on room air GI: Abdomen soft and nontender   NEURO:  Mental Status: Awake and alert oriented x 4 Speech/Language: No aphasia present.  Dysarthria present Left neglect present  Cranial Nerves:  II: PERRL.  Left hemianopia III, IV, VI: Right gaze preference V: Facial sensation decreased on left. VII: Prominent left facial droop. VIII: hearing intact to voice. IX, X: Palate elevates symmetrically. Phonation is normal.  XL:[id number] shrug weak but symmetric XII: tongue is midline Motor:  Left hemiparesis present.  Left leg with spontaneous posturing with stimulation Right arm and right leg without drift. Tone: is normal and bulk is normal Sensation-sensation is decreased on left arm and left leg. Coordination: FTN intact on right unable to perform on left Gait- deferred  Most Recent NIH 14    ASSESSMENT/PLAN  ICH:  right thalamus ICH s/p Eliquis reversal with Andexxa, etiology:  likely hypertensive bleed in the setting of Eliquis use Code Stroke CT head: 2.3 x 1.4 x 2.8 cm acute intraparenchymal hemorrhage centered at the right thalamus (estimated volume 5 mL). Associated intraventricular extension with small volume blood in the right lateral ventricle.  CT Angio head and neck: No LVO. Interval increase of Right Thalamic ICH, increased from 5ml to 9ml, increased edema with new trace right-to-left shift. CT C-spine: No fracture MRI w/ and w/o: Stable ICH comparison with repeat CTA.  CT repeat 11/29 stable ICH. Slight increase in leftward midline shift,  Mild interval increase in small volume intraventricular hemorrhage. 2D Echo: LVEF 60 to 65% no shunt LDL 123 HgbA1c 8.8 in 10/2022 VTE prophylaxis - SCDs. Eliquis  prior to admission, now on No  antithrombotic due to ICH Therapy recommendations:  CIR Disposition:  pending  Cholangiocarcinoma with metastasis CTA neck Pleural-based metastasis posterior right lung, increased in size from most recent PET-CT with invasion of posterior right 6th rib and right T6 tranverse process and associated right pleural effusion On immunotherapy Pain management MRI brain with contrast Small lesion on right parietal bone.  Recommend follow-up brain MRI in 3 months palliative care on board  Atrial fibrillation Home Meds: eliquis Eliquis reversed with Andexxa Currently rate controlled HOLD anticoagulation due to ICH  Hypertension Home meds:  vasotec Stable Cleviprex drip is currently off Restart home Vasotec 5mg ->10mg , add amlodipine 10 BP goal <160 now  Hyperlipidemia Home meds:  Crestor 10mg  LDL 123, goal < 70 Consider increase to crestor 20 at discharge  Diabetes type II Uncontrolled Home meds:  metformin, lantus HgbA1c 8.8 in 10/2022, goal < 7.0 CBGs SSI Recommend close follow-up with PCP for better DM control  Dysphagia Patient has post-stroke dysphagia SLP consulted Now on diet  Other Stroke Risk Factors Advanced age Former smoker  Other Active Problems Anxiety - Xanax home  med Thyroid Disease, synthroid home medication  Hospital day # 1   Pt seen by Neuro NP/[facility] and later by MD. Note/plan to be edited by MD as needed.    Gevena Mart DNP, ACNPC-AG  [facility]   ATTENDING NOTE: I reviewed above note and agree with the assessment and plan. Pt was seen and examined.   Son at the beside. Pt more awake alert today, orientated and answered all orientation questions. Still has mild dysarthria and left facial droop. Left UE flaccid and LLE hyperreflexia with stimulation related contraction. CT repeat today stable hematoma. Will relax BP to < 160. Off cleviprex. Put on po BP meds. Advance diet. PT and OT recommend CIR. Palliative care on board, continue full  code at this time.   For detailed assessment and plan, please refer to above/below as I have made changes wherever appropriate.   Marvel Plan, MD PhD Stroke Neurology 12/07/2022 4:19 PM  This patient is critically ill due to ICH, A-fib on Eliquis, interval increased ICH with IVH and at significant risk of neurological worsening, death form hematoma expansion, brain herniation, heart failure, stroke, hydrocephalus. This patient's care requires constant monitoring of vital signs, hemodynamics, respiratory and cardiac monitoring, review of multiple databases, neurological assessment, discussion with family, other specialists and medical decision making of high complexity. I spent 35 minutes of neurocritical care time in the care of this patient. I had long discussion with son and patient at bedside, updated pt current condition, treatment plan and potential prognosis, and answered all the questions. They expressed understanding and appreciation.    To contact Stroke Continuity provider, please refer to [email or web address]. After hours, contact General Neurology

## 2022-12-07 NOTE — Evaluation (Signed)
Physical Therapy Evaluation Patient Details Name: Erin Hoffman MRN: [id number] DOB: 1945-11-22 Today's Date: 12/07/2022  History of Present Illness  77 yo female L sided weakness s/p fall MRI ICH and small lesion R parietal bone PMH afib on eliquis HTN DM cholangiocarcinoma with metastasis to R 4th rib, T9 and possibly left humeral head liver mass, anxiety,  Clinical Impression  Patient presents with decreased mobility due to decreased balance, decreased postural awareness, decreased L side awareness, decreased strength  and decreased activity tolerance.  Previously she was independent living alone.  Currently needs +2 mod A for up OOB to recliner and difficulty with balance on EOB.  She will benefit from skilled PT in the acute setting and from post-acute inpatient rehab (hopeful for >3 hours/day) prior to d/c home.       If plan is discharge home, recommend the following: Two people to help with walking and/or transfers;Two people to help with bathing/dressing/bathroom;Assistance with cooking/housework;Assist for transportation;Help with stairs or ramp for entrance   Can travel by private vehicle        Equipment Recommendations Other (comment) (TBA)  Recommendations for Other Services  Rehab consult    Functional Status Assessment Patient has had a recent decline in their functional status and demonstrates the ability to make significant improvements in function in a reasonable and predictable amount of time.     Precautions / Restrictions Precautions Precautions: Fall Precaution Comments: bradycardia with sleeping noted, SBP <160, L inattention      Mobility  Bed Mobility Overal bed mobility: Needs Assistance Bed Mobility: Rolling, Supine to Sit Rolling: Mod assist   Supine to sit: +2 for physical assistance, +2 for safety/equipment, Mod assist, HOB elevated     General bed mobility comments: pushing off bed surface with R UE. pt noted to have spasm in L side with  movement. pt reports pain at the groin with all LLE movement    Transfers Overall transfer level: Needs assistance Equipment used: 2 person hand held assist   Sit to Stand: +2 physical assistance, +2 safety/equipment, Mod assist, From elevated surface           General transfer comment: pt stand pivot to chair in R visual field    Ambulation/Gait               General Gait Details: NT  Stairs            Wheelchair Mobility     Tilt Bed    Modified Rankin (Stroke Patients Only) Modified Rankin (Stroke Patients Only) Pre-Morbid Rankin Score: No symptoms Modified Rankin: Severe disability     Balance Overall balance assessment: Needs assistance Sitting-balance support: Single extremity supported, Feet supported Sitting balance-Leahy Scale: Poor Sitting balance - Comments: cues for head alignment and anterior pelvic tilt; leans to L; min A for balance     Standing balance-Leahy Scale: Zero Standing balance comment: pivot to recliner with +2 for standing attempts                             Pertinent Vitals/Pain Pain Assessment Pain Assessment: Faces Faces Pain Scale: Hurts little more Pain Location: with spasms on L side Pain Descriptors / Indicators: Grimacing, Guarding, Discomfort Pain Intervention(s): Monitored during session, Repositioned    Home Living Family/patient expects to be discharged to:: Private residence Living Arrangements: Alone Available Help at Discharge: Available PRN/intermittently;Family (son lives nearby but works) Type of Home: Apartment Home Access: Stairs  to enter Entrance Stairs-Rails: None Entrance Stairs-Number of Steps: 3   Home Layout: One level Home Equipment: Cane - single Librarian, academic (2 wheels) (someone borrowed her cane) Additional Comments: no animals    Prior Function Prior Level of Function : Independent/Modified Independent;Driving             Mobility Comments: no DME  used ADLs Comments: does all ADLS Iadls     Extremity/Trunk Assessment   Upper Extremity Assessment Upper Extremity Assessment: Defer to OT evaluation    Lower Extremity Assessment Lower Extremity Assessment: LLE deficits/detail LLE Deficits / Details: PROM grossly WFL, but initiates spasms pt c/o in groin noting hip and knee flexion in respose to some movement, some voluntary movement as well, but limited to command LLE Sensation: decreased light touch;decreased proprioception    Cervical / Trunk Assessment Cervical / Trunk Assessment: Other exceptions Cervical / Trunk Exceptions: cervical rotation R with limited movement of neck past midline with cues  Communication   Communication Communication: No apparent difficulties  Cognition Arousal: Alert Behavior During Therapy: WFL for tasks assessed/performed Overall Cognitive Status: Impaired/Different from baseline Area of Impairment: Awareness                           Awareness: Emergent   General Comments: lacks visual change and L inattention awareness.        General Comments General comments (skin integrity, edema, etc.): HR in 40's initiall with pt sleeping, VSS on RA    Exercises     Assessment/Plan    PT Assessment Patient needs continued PT services  PT Problem List Decreased strength;Decreased balance;Decreased cognition;Impaired tone;Decreased mobility;Decreased knowledge of use of DME;Impaired sensation;Decreased coordination;Decreased activity tolerance;Decreased safety awareness       PT Treatment Interventions DME instruction;Therapeutic activities;Cognitive remediation;Gait training;Therapeutic exercise;Patient/family education;Balance training;Functional mobility training;Neuromuscular re-education    PT Goals (Current goals can be found in the Care Plan section)  Acute Rehab PT Goals Patient Stated Goal: return to independent PT Goal Formulation: With patient Time For Goal Achievement:  12/21/22 Potential to Achieve Goals: Fair    Frequency Min 1X/week     Co-evaluation PT/OT/SLP Co-Evaluation/Treatment: Yes Reason for Co-Treatment: For patient/therapist safety;To address functional/ADL transfers;Complexity of the patient's impairments (multi-system involvement) PT goals addressed during session: Mobility/safety with mobility;Balance         AM-PAC PT "6 Clicks" Mobility  Outcome Measure Help needed turning from your back to your side while in a flat bed without using bedrails?: A Lot Help needed moving from lying on your back to sitting on the side of a flat bed without using bedrails?: Total Help needed moving to and from a bed to a chair (including a wheelchair)?: Total Help needed standing up from a chair using your arms (e.g., wheelchair or bedside chair)?: Total Help needed to walk in hospital room?: Total Help needed climbing 3-5 steps with a railing? : Total 6 Click Score: 7    End of Session Equipment Utilized During Treatment: Gait belt;Oxygen Activity Tolerance: Patient tolerated treatment well Patient left: in chair;with chair alarm set;with call bell/phone within reach Nurse Communication: Mobility status PT Visit Diagnosis: Other abnormalities of gait and mobility (R26.89);Hemiplegia and hemiparesis;Other symptoms and signs involving the nervous system (R29.898) Hemiplegia - Right/Left: Left Hemiplegia - dominant/non-dominant: Non-dominant Hemiplegia - caused by: Nontraumatic intracerebral hemorrhage    Time: 1610-9604 PT Time Calculation (min) (ACUTE ONLY): 34 min   Charges:   PT Evaluation $PT Eval Moderate Complexity:  1 Mod   PT General Charges $$ ACUTE PT VISIT: 1 Visit         Sheran Lawless, PT Acute Rehabilitation Services Office:[phone] 12/07/2022   Elray Mcgregor 12/07/2022, 5:18 PM

## 2022-12-07 NOTE — Progress Notes (Signed)
OT NOTE  RN STAFF  Please check splint every 4 hours during shift ( remove splint ) to assess for: * pain * redness *swelling  If any symptoms above present remove splint for 15 minutes. If symptoms continue - keep the splint removed and notify OT staff [phone] immediately.   Keep the UE elevated at all times on pillows / towels.  Splint should have two splint covers (blue cloth) with a mesh bag for cleaning. The splint cover (blue cloth) should be washed with soapy water and hung out to dry or washed on delicate in home washer. Do not throw away splint cover it is washable. Please have a set location to store the splints in patients room for daily application.    Splints are to be worn for 4 hours and off for 2 hours  Examples of schedule:  Splints off at 6am Splints on at 8 am Splints off at 12 pm Splints on at 2 pm Splints off at 6pm Splints on at 8 pm    To place the splint on:  1. Place the wrist in position first and secure strap 2. Position each digit and apply strap 3. The thumb and forearm strap should be applied last   The splints should fit as appeared here Strap over the PIP joint of finger Strap over the MCP ( knuckles)  joints of the hand Strap at the thumb Strap at the wrist Strap at the forearm   Brynn, OTR/L  Acute Rehabilitation Services Office: [phone] .  

## 2022-12-07 NOTE — Evaluation (Signed)
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   Hypothyroidism 10/23/2022   DMII (diabetes mellitus, type 2) (HCC) 10/23/2022   GERD (gastroesophageal reflux disease) 10/23/2022   HLD (hyperlipidemia) 10/23/2022   HTN (hypertension) 10/23/2022   Liver mass, left lobe 10/23/2022   New onset a-fib (HCC) 10/23/2022   Atrial fibrillation (HCC) 10/22/2022   Past Medical History:  Past Medical History:  Diagnosis Date   Anemia    Anxiety    Diabetes mellitus    H/O hiatal hernia    Hypertension    Hypothyroidism    Past Surgical History:  Past Surgical History:  Procedure Laterality Date   ABDOMINAL HYSTERECTOMY     CATARACT EXTRACTION W/PHACO Left 06/16/2013   Procedure: CATARACT EXTRACTION PHACO AND INTRAOCULAR LENS PLACEMENT (IOC);  Surgeon: [name] T. Nile Riggs, MD;  Location: [facility];  Service: Ophthalmology;  Laterality: Left;  CDE:4.65   COLONOSCOPY N/A 10/01/2014   Procedure: COLONOSCOPY;  Surgeon: Malissa Hippo, MD;  Location: [facility];  Service: Endoscopy;  Laterality: N/A;  1125   IR IMAGING GUIDED PORT INSERTION  11/20/2022   TONSILLECTOMY     TUBAL LIGATION     HPI:  77 yo female presenting 11/27 with L sided weakness s/p fall. MRI showed ICH and small lesion R parietal bone. CT Head 11/29 showed interval increase in size of IPH and slight increase in leftward midline shift. 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 Her speech is dyarthric, characterized by low volume and breath support as well as imprecise articulation in the setting of L facial droop. Cognitive assessment was somewhat abridged due to increasing lethargy as session progressed. She is typically independent at baseline but had difficulty this morning with storage > retrieval of new information, sustained attention, and mildly complex problem solving. She started to have an increasing number of errors as lethargy increased so will return on subsequent date for ongoing diagnostic tx.    SLP Assessment  SLP Recommendation/Assessment: Patient needs continued Speech Lanaguage Pathology Services SLP Visit Diagnosis: Dysarthria and anarthria (R47.1);Cognitive communication deficit (R41.841)    Recommendations for follow up therapy are one component of a multi-disciplinary discharge planning process, led by the attending physician.  Recommendations may be updated based on patient status, additional functional criteria and insurance authorization.    Follow Up Recommendations  Acute inpatient rehab (3hours/day)    Assistance Recommended at Discharge  Frequent or constant Supervision/Assistance  Functional Status Assessment Patient has had a recent decline in their functional status and demonstrates the ability to make significant improvements in function in a reasonable and predictable amount of time.  Frequency and Duration min 2x/week  2 weeks      SLP Evaluation Cognition  Overall Cognitive Status: Impaired/Different from baseline (9/14 possible points administered on SLUMS) Arousal/Alertness: Lethargic Orientation Level: Oriented X4 Attention: Sustained Sustained Attention: Impaired Sustained Attention Impairment: Verbal complex Memory: Impaired Memory Impairment: Storage deficit;Retrieval deficit Problem Solving: Impaired Problem Solving Impairment: Verbal complex  Comprehension  Auditory  Comprehension Overall Auditory Comprehension: Appears within functional limits for tasks assessed    Expression Expression Primary Mode of Expression: Verbal Verbal Expression Overall Verbal Expression: Impaired Initiation: No impairment Automatic Speech: Name;Social Response Level of Generative/Spontaneous Verbalization: Conversation Pragmatics: Impairment Impairments: Topic maintenance;Turn Taking;Abnormal affect Non-Verbal Means of Communication: Not applicable   Oral / Motor  Oral Motor/Sensory Function Overall Oral Motor/Sensory Function:  (L facial droop) Motor Speech Overall Motor Speech: Impaired Respiration: Impaired Level of Impairment: Conversation Phonation: Low vocal intensity Resonance: Within functional limits Articulation: Impaired Level of Impairment: Conversation Intelligibility: Intelligibility reduced Conversation: 75-100% accurate Motor Planning: Witnin functional limits            Mahala Menghini., M.A. CCC-SLP Acute Rehabilitation Services Office [phone]  Secure chat preferred  12/07/2022, 12:35 PM

## 2022-12-07 NOTE — Evaluation (Signed)
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 Pt currently with functional limitiations due to the deficits listed below (see OT problem list). Pt at baseline lives indep driving and no DME for transfers. Pt at this time demonstrates L hemiplegia and visual changes. Pt will require increased level of care for d/c home.  Pt will benefit from skilled OT to increase their independence and safety with adls and balance to allow discharge Patient will benefit from intensive inpatient follow up therapy, >3 hours/day .        If plan is discharge home, recommend the following: Two people to help with walking and/or transfers;Two people to help with bathing/dressing/bathroom    Functional Status Assessment  Patient has had a recent decline in their functional status and demonstrates the ability to make significant improvements in function in a reasonable and predictable amount of time.  Equipment Recommendations  BSC/3in1;Wheelchair (measurements OT);Wheelchair cushion (measurements OT);Hospital bed;Hoyer lift    Recommendations for Other Services Rehab consult     Precautions / Restrictions Precautions Precautions: Fall Precaution Comments: bradycardia with sleeping noted Restrictions Weight Bearing Restrictions: No      Mobility Bed Mobility Overal bed mobility: Needs Assistance Bed Mobility: Rolling, Supine to Sit Rolling: Mod assist   Supine to sit: +2 for physical assistance, +2 for safety/equipment, Mod assist, HOB elevated     General bed mobility comments: pushing off bed surface  with R UE. pt noted to have spasm in L side with movement. pt reports pain at the groin with all LLE movement    Transfers Overall transfer level: Needs assistance Equipment used: 2 person hand held assist Transfers: Sit to/from Stand Sit to Stand: +2 physical assistance, +2 safety/equipment, Mod assist, From elevated surface           General transfer comment: pt stand pivot to chair in R visual field      Balance Overall balance assessment: Needs assistance Sitting-balance support: Single extremity supported, Feet supported Sitting balance-Leahy Scale: Poor Sitting balance - Comments: cues for head alignment and anterior pelvic tilt                                   ADL either performed or assessed with clinical judgement   ADL Overall ADL's : Needs assistance/impaired Eating/Feeding: Minimal assistance;Sitting Eating/Feeding Details (indicate cue type and reason): pt recalling information of items on tray and needs min cues to turn head toward L side pt able to scan and locate with increaesed time Grooming: Wash/dry hands;Moderate assistance;Sitting   Upper Body Bathing: Moderate assistance   Lower Body Bathing: Total assistance           Toilet Transfer: +2 for physical assistance;Moderate assistance;Stand-pivot;BSC/3in1 Toilet Transfer Details (indicate cue type and reason): stepping with R LE and needs (A) for balance and upright posture           General ADL Comments: transfered from bed to chair to eat lunch. tray slightly off set more L to encourage visual scanning and reaching  Vision Baseline Vision/History: 1 Wears glasses (for reading) Ability to See in Adequate Light: 0 Adequate Vision Assessment?: Wears glasses for reading Eye Alignment: Within Functional Limits Ocular Range of Motion: Within Functional Limits Alignment/Gaze Preference: Gaze right Tracking/Visual Pursuits: Impaired - to be further tested in functional  context Saccades: Impaired - to be further tested in functional context Convergence: Impaired - to be further tested in functional context Additional Comments: pt needs cues to turn head and use compensatory strategies to locate items in L visual field     Perception Perception: Impaired Preception Impairment Details: Inattention/Neglect     Praxis         Pertinent Vitals/Pain Pain Assessment Pain Assessment: Faces Faces Pain Scale: Hurts little more Pain Location: with spasms on L side Pain Descriptors / Indicators: Grimacing, Guarding, Discomfort Pain Intervention(s): Monitored during session, Premedicated before session, Repositioned     Extremity/Trunk Assessment Upper Extremity Assessment Upper Extremity Assessment: Right hand dominant;RUE deficits/detail;LUE deficits/detail RUE Deficits / Details: WFL LUE Deficits / Details: no movement noted. subluxation of 1 digit noted. pt without any scapula movement LUE: Subluxation noted LUE Sensation: decreased light touch;decreased proprioception LUE Coordination: decreased fine motor;decreased gross motor   Lower Extremity Assessment Lower Extremity Assessment: Defer to PT evaluation   Cervical / Trunk Assessment Cervical / Trunk Assessment: Other exceptions (cervical rotation toward R due to gaze preference. pt able to reach midline. with cues able to scan L side)   Communication Communication Communication: No apparent difficulties   Cognition Arousal: Alert Behavior During Therapy: WFL for tasks assessed/performed Overall Cognitive Status: Impaired/Different from baseline Area of Impairment: Awareness                           Awareness: Emergent   General Comments: lacks visual change and L inattention awareness.     General Comments  bradycardia noted on arrival while pt sleeping. pt with stable VSS on RA    Exercises     Shoulder Instructions      Home Living Family/patient expects to be  discharged to:: Private residence Living Arrangements: Alone Available Help at Discharge: Available PRN/intermittently;Family (son lives nearby but uncertain per patient how much he can help as he is working) Type of Home: Apartment Home Access: Stairs to enter Secretary/administrator of Steps: 3 Entrance Stairs-Rails: None Home Layout: One level     Bathroom Shower/Tub: Chief Strategy Officer: Standard     Home Equipment: [employer] - single point   Additional Comments: no animals  Lives With: Alone    Prior Functioning/Environment Prior Level of Function : Independent/Modified Independent;Driving             Mobility Comments: no DME used ADLs Comments: does all ADLS Iadls        OT Problem List: Decreased strength;Decreased range of motion;Decreased activity tolerance;Impaired balance (sitting and/or standing);Impaired vision/perception;Decreased coordination;Decreased cognition;Decreased safety awareness;Decreased knowledge of use of DME or AE;Impaired sensation;Impaired tone;Impaired UE functional use;Pain;Cardiopulmonary status limiting activity      OT Treatment/Interventions: Self-care/ADL training;Therapeutic exercise;Neuromuscular education;Energy conservation;DME and/or AE instruction;Manual therapy;Modalities;Therapeutic activities;Cognitive remediation/compensation;Visual/perceptual remediation/compensation;Patient/family education;Balance training    OT Goals(Current goals can be found in the care plan section) Acute Rehab OT Goals Patient Stated Goal: to return back to apartment OT Goal Formulation: With patient Time For Goal Achievement: 12/21/22 Potential to Achieve Goals: Good  OT Frequency: Min 1X/week    Co-evaluation PT/OT/SLP Co-Evaluation/Treatment: Yes Reason for Co-Treatment: For patient/therapist safety;To address functional/ADL  transfers;Complexity of the patient's impairments (multi-system involvement)   OT goals addressed during  session: ADL's and self-care;Proper use of Adaptive equipment and DME;Strengthening/ROM      AM-PAC OT "6 Clicks" Daily Activity     Outcome Measure Help from another person eating meals?: A Little Help from another person taking care of personal grooming?: A Little Help from another person toileting, which includes using toliet, bedpan, or urinal?: A Lot Help from another person bathing (including washing, rinsing, drying)?: A Lot Help from another person to put on and taking off regular upper body clothing?: A Lot Help from another person to put on and taking off regular lower body clothing?: Total 6 Click Score: 13   End of Session Equipment Utilized During Treatment: Gait belt Nurse Communication: Mobility status;Precautions;Need for lift equipment  Activity Tolerance: Patient tolerated treatment well Patient left: in chair;with chair alarm set;with call bell/phone within reach  OT Visit Diagnosis: Unsteadiness on feet (R26.81);Muscle weakness (generalized) (M62.81);Hemiplegia and hemiparesis Hemiplegia - Right/Left: Left Hemiplegia - dominant/non-dominant: Non-Dominant Hemiplegia - caused by: Nontraumatic SAH                Time: 4540-9811 OT Time Calculation (min): 34 min Charges:  OT General Charges $OT Visit: 1 Visit OT Evaluation $OT Eval Moderate Complexity: 1 Mod   Brynn, OTR/L  Acute Rehabilitation Services Office: [phone] .   Mateo Flow 12/07/2022, 2:58 PM

## 2022-12-07 NOTE — Progress Notes (Signed)
   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 Presented with left weakness and found to have a right thalamic hemorrhage.   Today's Discussion 12/07/2022  *Please note that this is a verbal dictation therefore any spelling or grammatical errors are due to the "Dragon Medical One" system interpretation.  Chart reviewed inclusive of vital signs, progress notes, laboratory results, and diagnostic images.   I met at bedside with Tamilia this morning in the presence of her youngest son, [name]. She is alert and oriented to person, place, and situation. We reviewed her clinical situation in the setting of her stroke.   Discussed Guinevere's wishes moving forward inclusive of her resuscitation status. Brissia is clear that she does not desire chest compressions, shocks, or intubation. She is clear that she does not want to be on a mechanical ventilator. We discussed a DNAR/DNI and this would not change the trajectory of her current care. She is agreeable to this.   We reviewed patients cholangiocarcinoma. She shares that she would like to continue treatment. We reviewed it is unclear what treatment may look like moving forward as it's not clear how debilitated she may be from her stroke. I shared that I would reach out to Oncologist, Dr. Ellin Saba for clarity.   Created space and opportunity for patient to explore thoughts feelings and fears regarding current medical situation. She shares that it will be interesting to see if she can maintain some degree of autonomy as she is fiercely independent. She is aware that her recovery may be long and potentially challenging. She is agreeable to PT/OT/speech therapies moving forward.    Questions and concerns addressed/Palliative Support Provided.   Objective Assessment: Vital Signs Vitals:   12/07/22 0915 12/07/22 0930  BP: (!) 108/50 (!) 109/44  Pulse: (!) 57 (!) 55  Resp: 19 16  Temp:    SpO2: 94% 93%    Intake/Output Summary (Last 24 hours) at 12/07/2022 1051 Last data filed at 12/07/2022 0830 Gross per 24 hour  Intake 365.72 ml  Output 1400 ml  Net -1034.28 ml   Last Weight  Most recent update: 12/06/2022 12:21 AM    Weight  84.1 kg (185 lb 6.5 oz)            Gen: Elderly Caucasian female in no acute distress HEENT: Dry mucous membranes CV: Regular rate and rhythm PULM: On room air breathing is even and nonlabored ABD: soft/nontender EXT: Left-sided hemiplegia Neuro: Alert and oriented x3  SUMMARY OF RECOMMENDATIONS   DNAR/DNI  Continue to allow time for outcomes  I will reach out to [place] to gain insights on additional cancer treatments  Appreciate PT/OT/Speech involvement  Ongoing Palliative Care support  Billing based on MDM: High _____________________________________________________________________________________ Lamarr Lulas [place] Palliative Medicine Team Team Cell Phone: [phone] Please utilize secure chat with additional questions, if there is no response within 30 minutes please call the above phone number  Palliative Medicine Team providers are available by phone from 7am to 7pm daily and can be reached through the team cell phone.  Should this patient require assistance outside of these hours, please call the patient's attending physician.

## 2022-12-07 NOTE — Progress Notes (Signed)
? ?  Inpatient Rehab Admissions Coordinator : ? ?Per therapy recommendations, patient was screened for CIR candidacy by Blue Ruggerio RN MSN.  At this time patient appears to be a potential candidate for CIR. I will place a rehab consult per protocol for full assessment. Please call me with any questions. ? ?Jerriyah Louis RN MSN ?Admissions Coordinator ?[phone] ?  ?

## 2022-12-07 NOTE — TOC CM/SW Note (Addendum)
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 PTA, pt independent and living alone; she has a son nearby who can provide intermittent support.  CIR recommended by OT; await PT and ST recs.  Will continue to follow progress.   Transition of Care Asessment: Insurance and Status: Insurance coverage has been reviewed Patient has primary care physician: Yes (Dr, Sherwood Gambler) Home environment has been reviewed: Lives alone Prior level of function:: Independent, driving Prior/Current Home Services: No current home services Social Determinants of Health Reivew: SDOH reviewed no interventions necessary Readmission risk has been reviewed: Yes Transition of care needs: transition of care needs identified, TOC will continue to follow  Quintella Baton, RN, BSN  Trauma/Neuro ICU Case Manager [phone]

## 2022-12-07 NOTE — Progress Notes (Signed)
Orthopedic Tech Progress Note Patient Details:  Erin Hoffman [birth year] [id number]  Resting hand splint ordered for pt though [employer]   Patient ID: Erin Hoffman, female   DOB: 02-Jul-1945, 77 y.o.   MRN: [id number]  Erin Hoffman 12/07/2022, 4:10 PM

## 2022-12-08 DIAGNOSIS — I610 Nontraumatic intracerebral hemorrhage in hemisphere, subcortical: Principal | ICD-10-CM

## 2022-12-08 DIAGNOSIS — E119 Type 2 diabetes mellitus without complications: Secondary | ICD-10-CM | POA: Diagnosis not present

## 2022-12-08 DIAGNOSIS — R21 Rash and other nonspecific skin eruption: Secondary | ICD-10-CM | POA: Diagnosis not present

## 2022-12-08 LAB — BASIC METABOLIC PANEL
Anion gap: 11 (ref 5–15)
BUN: 16 mg/dL (ref 8–23)
CO2: 22 mmol/L (ref 22–32)
Calcium: 8.8 mg/dL — ABNORMAL LOW (ref 8.9–10.3)
Chloride: 105 mmol/L (ref 98–111)
Creatinine, Ser: 0.83 mg/dL (ref 0.44–1.00)
GFR, Estimated: >60 mL/min (ref >60)
Glucose, Bld: 145 mg/dL — ABNORMAL HIGH (ref 70–99)
Potassium: 4 mmol/L (ref 3.5–5.1)
Sodium: 138 mmol/L (ref 135–145)

## 2022-12-08 LAB — RAPID URINE DRUG SCREEN, HOSP PERFORMED
Amphetamines: NOT DETECTED
Barbiturates: NOT DETECTED
Benzodiazepines: NOT DETECTED
Cocaine: NOT DETECTED
Opiates: NOT DETECTED
Tetrahydrocannabinol: NOT DETECTED

## 2022-12-08 LAB — URINALYSIS, ROUTINE W REFLEX MICROSCOPIC
Bacteria, UA: NONE SEEN
Bilirubin Urine: NEGATIVE
Glucose, UA: NEGATIVE mg/dL
Ketones, ur: NEGATIVE mg/dL
Nitrite: NEGATIVE
Protein, ur: NEGATIVE mg/dL
Specific Gravity, Urine: 1.018 (ref 1.005–1.030)
WBC, UA: >50 WBC/hpf (ref 0–5)
pH: 5 (ref 5.0–8.0)

## 2022-12-08 LAB — GLUCOSE, CAPILLARY
Glucose-Capillary: 101 mg/dL — ABNORMAL HIGH (ref 70–99)
Glucose-Capillary: 184 mg/dL — ABNORMAL HIGH (ref 70–99)
Glucose-Capillary: 191 mg/dL — ABNORMAL HIGH (ref 70–99)
Glucose-Capillary: 201 mg/dL — ABNORMAL HIGH (ref 70–99)
Glucose-Capillary: 148 mg/dL — ABNORMAL HIGH (ref 70–99)

## 2022-12-08 LAB — MAGNESIUM: Magnesium: 1.6 mg/dL — ABNORMAL LOW (ref 1.7–2.4)

## 2022-12-08 MED ORDER — INSULIN ASPART 100 UNIT/ML IJ SOLN
0.0000 [IU] | Freq: Three times a day (TID) | INTRAMUSCULAR | Status: DC
  Administered 2022-12-08 (×2): 3 [IU] via SUBCUTANEOUS
  Administered 2022-12-09: 1 [IU] via SUBCUTANEOUS
  Administered 2022-12-09 (×2): 2 [IU] via SUBCUTANEOUS
  Administered 2022-12-10 (×2): 3 [IU] via SUBCUTANEOUS
  Administered 2022-12-10: 2 [IU] via SUBCUTANEOUS
  Administered 2022-12-11: 5 [IU] via SUBCUTANEOUS
  Administered 2022-12-11: 2 [IU] via SUBCUTANEOUS
  Administered 2022-12-11 – 2022-12-12 (×2): 9 [IU] via SUBCUTANEOUS
  Administered 2022-12-12: 5 [IU] via SUBCUTANEOUS

## 2022-12-08 MED ORDER — MAGNESIUM SULFATE 2 GM/50ML IV SOLN
2.0000 g | Freq: Once | INTRAVENOUS | Status: AC
  Administered 2022-12-08: 2 g via INTRAVENOUS
  Filled 2022-12-08: qty 50

## 2022-12-08 MED ORDER — ACETAMINOPHEN 500 MG PO TABS
500.0000 mg | ORAL_TABLET | Freq: Four times a day (QID) | ORAL | Status: DC
  Administered 2022-12-08 – 2022-12-19 (×38): 500 mg via ORAL
  Filled 2022-12-08 (×39): qty 1

## 2022-12-08 MED ORDER — ROSUVASTATIN CALCIUM 20 MG PO TABS
20.0000 mg | ORAL_TABLET | Freq: Every day | ORAL | Status: DC
  Administered 2022-12-08 – 2022-12-18 (×11): 20 mg via ORAL
  Filled 2022-12-08 (×11): qty 1

## 2022-12-08 MED ORDER — MAGNESIUM OXIDE -MG SUPPLEMENT 400 (240 MG) MG PO TABS
400.0000 mg | ORAL_TABLET | Freq: Two times a day (BID) | ORAL | Status: DC
  Administered 2022-12-08 – 2022-12-12 (×8): 400 mg via ORAL
  Filled 2022-12-08 (×9): qty 1

## 2022-12-08 MED ORDER — INSULIN ASPART 100 UNIT/ML IJ SOLN
0.0000 [IU] | Freq: Every day | INTRAMUSCULAR | Status: DC
  Administered 2022-12-09 – 2022-12-10 (×2): 3 [IU] via SUBCUTANEOUS
  Administered 2022-12-11 – 2022-12-14 (×4): 4 [IU] via SUBCUTANEOUS
  Administered 2022-12-15: 3 [IU] via SUBCUTANEOUS
  Administered 2022-12-18: 2 [IU] via SUBCUTANEOUS

## 2022-12-08 MED ORDER — ALPRAZOLAM 0.5 MG PO TABS
0.5000 mg | ORAL_TABLET | Freq: Every evening | ORAL | Status: DC | PRN
  Administered 2022-12-08 – 2022-12-17 (×10): 0.5 mg via ORAL
  Filled 2022-12-08 (×11): qty 1

## 2022-12-08 MED ORDER — OXYCODONE HCL 5 MG PO TABS
5.0000 mg | ORAL_TABLET | Freq: Four times a day (QID) | ORAL | Status: DC
  Administered 2022-12-08 – 2022-12-11 (×12): 5 mg via ORAL
  Filled 2022-12-08 (×14): qty 1

## 2022-12-08 NOTE — Progress Notes (Signed)
Inpatient Rehab Admissions:  Inpatient Rehab Consult received.  I met with patient and son [name] at the bedside for rehabilitation assessment and to discuss goals and expectations of an inpatient rehab admission.  Discussed average length of stay, insurance authorization requirement and discharge home after completion of CIR. Both acknowledged understanding. Pt would like to pursue CIR. Son, [name] supportive. [name] confirmed that family will be able to provide 24/7 support for pt after discharge. Will continue follow.  Signed: Wolfgang Phoenix, MS, CCC-SLP Admissions Coordinator [phone]

## 2022-12-08 NOTE — Progress Notes (Addendum)
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  She was found to have intracranial hemorrhage.  Given Andexxa in ED and started on Cleviprex for BP control.  She was transferred to [facility] ICU and admitted under neurology service.  PT recommended CIR.  Care transferred to [facility] service from 12/08/2022 onwards.  Assessment & Plan:   Right thalamus ICH with left-sided weakness -Most likely hypertensive bleed in the setting of Eliquis use.  Status post Eliquis reversal with Andexxa -Initially started on Cleviprex for BP control. She was transferred to [facility] ICU and admitted under neurology service.  -CTA head and neck showed no LVO but intra increase of right thalamic ICH.  CT C-spine showed no fracture.  MRI of brain showed stable ICH comparison with repeat CTA -Repeat CT head on 12/07/2022 showed stable ICH -2D echo showed EF of 60 to 65% with no shunt; LDL 123; A1c 8.8 -Was on Eliquis prior to admission: Now not on any antithrombotics due to ICH -Care transferred to [facility] service from 12/08/2022 onwards. -PT recommending CIR.  CIR following.  Cholangiocarcinoma with mets -On immunotherapy as an outpatient.  MRI brain showed small lesion on right parietal bone and recommended follow-up brain MRI in 3 months -Palliative care following. -Outpatient follow-up with oncology/Dr. Ellin Saba  Hypomagnesemia -Replace.  Repeat a.m. labs  Paroxysmal A-fib -Currently mildly bradycardic.  Eliquis on hold.  Hypertension -Off Cleviprex drip.  Blood pressure stable.  Continue amlodipine, enalapril  Hypothyroidism -Continue  levothyroxine  Diabetes mellitus type 2 uncontrolled with hyperglycemia -A1c 8.8.  Blood sugars currently controlled.  Continue CBGs with SSI.  On metformin and Lantus at home  Hyperlipidemia -LDL 123.  On Crestor 10 mg daily at home.  Will put her on Crestor 20 mg daily  Dysphagia -Diet as per SLP recommendations  DVT prophylaxis: SCDs Code Status: DNR Family Communication: Son at bedside Disposition Plan: Status is: Inpatient Remains inpatient appropriate because: Of severity of illness  Consultants: Neurology/palliative care  Procedures: As above  Antimicrobials: None   Subjective: Patient seen and examined at bedside.  Orestralyn.  Still has left-sided weakness.  No fever, agitation, vomiting reported.  Objective: Vitals:   12/08/22 0300 12/08/22 0400 12/08/22 0500 12/08/22 0800  BP: (!) 105/53 (!) 118/55 (!) 118/54   Pulse: (!) 52 (!) 55 60   Resp: 16 16 15    Temp:  98.5 F (36.9 C)  98.5 F (36.9 C)  TempSrc:  Axillary  Oral  SpO2: 92% 93% 93%   Weight:      Height:        Intake/Output Summary (Last 24 hours) at 12/08/2022 1115 Last data filed at 12/07/2022 2200 Gross per 24 hour  Intake 80 ml  Output --  Net 80 ml   Filed Weights   12/06/22 0015 12/06/22 0019  Weight: 84.1 kg 84.1 kg    Examination:  General exam: Appears calm and comfortable.  Elderly female lying in bed.  On room air. Respiratory system: Bilateral decreased breath sounds at bases with scattered crackles Cardiovascular system: S1 & S2 heard, intermittently bradycardic gastrointestinal system: Abdomen is nondistended, soft and nontender. Normal bowel sounds heard. Extremities: No cyanosis, clubbing,  edema  Central nervous system: Awake, left-sided weakness present.  Slow to respond.  Skin: No rashes, lesions or ulcers Psychiatry: Flat affect.  Not agitated.   Data Reviewed: I have personally reviewed following labs and imaging studies  CBC: Recent Labs  Lab 12/04/22 0815  12/05/22 2340 12/07/22 0614  WBC 12.4* 22.2* 21.8*  NEUTROABS 10.0* 20.2* 18.5*  HGB 11.8* 11.6* 10.9*  HCT 36.0 36.2 32.3*  MCV 88.0 89.8 87.3  PLT 267 322 284   Basic Metabolic Panel: Recent Labs  Lab 12/04/22 0805 12/05/22 2340 12/07/22 0614 12/08/22 0423  NA 135 132* 138 138  K 3.6 4.8 4.0 4.0  CL 102 101 108 105  CO2 21* 21* 20* 22  GLUCOSE 289* 336* 90 145*  BUN 10 33* 20 16  CREATININE 0.97 1.08* 0.83 0.83  CALCIUM 8.8* 9.1 8.8* 8.8*  MG 1.5* 2.1 1.7 1.6*   GFR: Estimated Creatinine Clearance: 64.3 mL/min (by C-G formula based on SCr of 0.83 mg/dL). Liver Function Tests: Recent Labs  Lab 12/04/22 0805 12/05/22 2340 12/07/22 0614  AST 23 58* 84*  ALT 14 25 45*  ALKPHOS 114 114 109  BILITOT 1.1 1.1 1.1  PROT 6.9 7.3 5.7*  ALBUMIN 3.4* 3.7 2.9*   No results for input(s): "LIPASE", "AMYLASE" in the last 168 hours. No results for input(s): "AMMONIA" in the last 168 hours. Coagulation Profile: Recent Labs  Lab 12/05/22 2340  INR 1.7*   Cardiac Enzymes: No results for input(s): "CKTOTAL", "CKMB", "CKMBINDEX", "TROPONINI" in the last 168 hours. BNP (last 3 results) No results for input(s): "PROBNP" in the last 8760 hours. HbA1C: No results for input(s): "HGBA1C" in the last 72 hours. CBG: Recent Labs  Lab 12/07/22 0745 12/07/22 1142 12/07/22 1548 12/07/22 2108 12/08/22 0722  GLUCAP 81 151* 192* 149* 101*   Lipid Profile: Recent Labs    12/07/22 0614  CHOL 175  HDL 20*  LDLCALC 123*  TRIG 158*  CHOLHDL 8.8   Thyroid Function Tests: No results for input(s): "TSH", "T4TOTAL", "FREET4", "T3FREE", "THYROIDAB" in the last 72 hours. Anemia Panel: No results for input(s): "VITAMINB12", "FOLATE", "FERRITIN", "TIBC", "IRON", "RETICCTPCT" in the last 72 hours. Sepsis Labs: No results for input(s): "PROCALCITON", "LATICACIDVEN" in the last 168 hours.  Recent Results (from the past 240 hour(s))  MRSA Next Gen by PCR, Nasal     Status: None    Collection Time: 12/06/22  2:17 AM   Specimen: Nasal Mucosa; Nasal Swab  Result Value Ref Range Status   MRSA by PCR Next Gen NOT DETECTED NOT DETECTED Final    Comment: (NOTE) The GeneXpert MRSA Assay (FDA approved for NASAL specimens only), is one component of a comprehensive MRSA colonization surveillance program. It is not intended to diagnose MRSA infection nor to guide or monitor treatment for MRSA infections. Test performance is not FDA approved in patients less than 57 years old. Performed at [facility], [address], [place], [place] [address]          Radiology Studies: CT HEAD WO CONTRAST ( )  Result Date: 12/07/2022 CLINICAL DATA:  Stroke, hemorrhagic. EXAM: CT HEAD WITHOUT CONTRAST TECHNIQUE: Contiguous axial images were obtained from the base of the skull through the vertex without intravenous contrast. RADIATION DOSE REDUCTION: This exam was performed according to the departmental dose-optimization program which includes automated exposure control, adjustment of the mA and/or kV according to patient size and/or use of iterative reconstruction technique. COMPARISON:  None Available. FINDINGS: Brain: Mild interval increase  in size of an intraparenchymal hemorrhage centered in the right thalamus which now measures up to 2.9 x 2.7 x 2.1 cm on reformatted imaging (previously 2.8 x 2.4 x 2.0 cm when remeasured similarly on the prior). Slight interval increase in leftward midline shift (up to 3 mm). Mild interval increase in small volume intraventricular hemorrhage. No evidence of acute large vascular territory infarct, visible mass lesion or hydrocephalus. Vascular: No hyperdense vessel identified. Skull: No acute fracture. Sinuses/Orbits: Clear sinuses.  No acute orbital findings. IMPRESSION: 1. Mild interval increase in size of an intraparenchymal hemorrhage centered in the right thalamus which now measures up to 2.9 x 2.7 x 2.1 cm (previously 2.8 x 2.4 x 2.0 cm when  remeasured similarly on the prior). 2. Slight increase in leftward midline shift (up to 3 mm). 3. Mild interval increase in small volume intraventricular hemorrhage. These results will be called to the ordering clinician or representative by the Radiologist Assistant, and communication documented in the PACS or [employer]. Electronically Signed   By: Feliberto Harts M.D.   On: 12/07/2022 08:44        Scheduled Meds:  acetaminophen  500 mg Oral Q6H   amLODipine  10 mg Oral Daily   Chlorhexidine Gluconate Cloth  6 each Topical Daily   enalapril  10 mg Oral Daily   insulin aspart  0-5 Units Subcutaneous QHS   insulin aspart  0-9 Units Subcutaneous TID WC   levothyroxine  137 mcg Oral QAC breakfast   magnesium oxide  400 mg Oral BID   oxyCODONE  5 mg Oral Q6H   pantoprazole  40 mg Oral Daily   senna-docusate  1 tablet Oral BID   sodium chloride flush  10-40 mL Intracatheter Q12H   Continuous Infusions:        Glade Lloyd, MD [facility] 12/08/2022, 11:15 AM

## 2022-12-08 NOTE — Progress Notes (Addendum)
STROKE TEAM PROGRESS NOTE   BRIEF HPI [name] is a 77 y.o. 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 Given Andexxa 900mg  in ED and started on cleviprex for BP control.  She is currently on immunotherapy for her cancer, started in November.  NIH on Admission: 18 mRs: 0 ICH Score: 1 for [facility]  SIGNIFICANT HOSPITAL EVENTS  11/27: Presented to [facility] 11/28: Transferred and Admitted to [facility]  Overnight repeat head CT revealed worsening ICH.  INTERIM HISTORY/SUBJECTIVE Son is at the bedside.  RN at the bedside No new neurological events overnight  Magnesium 1.6 being replaced. Neurological exam is unchanged  OBJECTIVE  CBC    Component Value Date/Time   WBC 21.8 (H) 12/07/2022 0614   RBC 3.70 (L) 12/07/2022 0614   HGB 10.9 (L) 12/07/2022 0614   HCT 32.3 (L) 12/07/2022 0614   PLT 284 12/07/2022 0614   MCV 87.3 12/07/2022 0614   MCH 29.5 12/07/2022 0614   MCHC 33.7 12/07/2022 0614   RDW 16.2 (H) 12/07/2022 0614   LYMPHSABS 1.3 12/07/2022 0614   MONOABS 0.1 12/07/2022 0614   EOSABS 0.1 12/07/2022 0614   BASOSABS 0.1 12/07/2022 0614    BMET    Component Value Date/Time   NA 138 12/08/2022 0423   K 4.0 12/08/2022 0423   CL 105 12/08/2022 0423   CO2 22 12/08/2022 0423   GLUCOSE 145 (H) 12/08/2022 0423   BUN 16 12/08/2022 0423   CREATININE 0.83 12/08/2022 0423   CALCIUM 8.8 (L) 12/08/2022 0423   GFRNONAA >60 12/08/2022 0423    IMAGING past 24 hours No results found.  Vitals:   12/08/22 0300 12/08/22 0400 12/08/22 0500 12/08/22 0800  BP: (!) 105/53 (!) 118/55 (!) 118/54   Pulse: (!) 52 (!) 55 60   Resp: 16 16 15    Temp:  98.5 F (36.9 C)  98.5 F (36.9 C)  TempSrc:  Axillary  Oral  SpO2: 92% 93% 93%   Weight:      Height:         PHYSICAL  EXAM General:  Alert, well-nourished, well-developed elderly Caucasian lady in no acute distress Psych:  Mood and affect appropriate for situation CV: Regular rate and rhythm on monitor Respiratory:  Regular, unlabored respirations on room air GI: Abdomen soft and nontender   NEURO:  Mental Status: Awake and alert oriented x 4 Speech/Language: No aphasia present.  Dysarthria present Left neglect present  Cranial Nerves:  II: PERRL.  Left hemianopia III, IV, VI: Right gaze preference V: Facial sensation decreased on left. VII: Prominent left facial droop. VIII: hearing intact to voice. IX, X: Palate elevates symmetrically. Phonation is normal.  WU:[id number] shrug weak but symmetric XII: tongue is midline Motor:  left hemiparesis present.  Left leg with spontaneous posturing with stimulation Right arm and right leg without drift. Tone: is normal and bulk is normal Sensation-sensation is decreased on left arm and left leg. Coordination: FTN intact on right unable to perform on left Gait- deferred  Most Recent NIH 14    ASSESSMENT/PLAN  ICH:  right thalamus ICH s/p Eliquis reversal with Andexxa, etiology:  likely hypertensive bleed in the setting of Eliquis use Code Stroke CT head: 2.3 x 1.4 x 2.8 cm acute intraparenchymal hemorrhage centered at the right thalamus (estimated volume  5 mL). Associated intraventricular extension with small volume blood in the right lateral ventricle.  CT Angio head and neck: No LVO. Interval increase of Right Thalamic ICH, increased from 5ml to 9ml, increased edema with new trace right-to-left shift. CT C-spine: No fracture MRI w/ and w/o: Stable ICH comparison with repeat CTA.  CT repeat 11/29 stable ICH. Slight increase in leftward midline shift,  Mild interval increase in small volume intraventricular hemorrhage. 2D Echo: LVEF 60 to 65% no shunt LDL 123 HgbA1c 8.8 in 10/2022 VTE prophylaxis - SCDs. Eliquis  prior to admission, now on No  antithrombotic due to ICH Therapy recommendations:  CIR Disposition:  pending  Cholangiocarcinoma with metastasis CTA neck Pleural-based metastasis posterior right lung, increased in size from most recent PET-CT with invasion of posterior right 6th rib and right T6 tranverse process and associated right pleural effusion On immunotherapy Pain management MRI brain with contrast Small lesion on right parietal bone.  Recommend follow-up brain MRI in 3 months palliative care on board  Atrial fibrillation Home Meds: eliquis Eliquis reversed with Andexxa Currently rate controlled HOLD anticoagulation due to ICH  Hypertension Home meds:  vasotec Stable Cleviprex drip is currently off Restart home Vasotec 5mg ->10mg , add amlodipine 10 BP goal <160 now  Hyperlipidemia Home meds:  Crestor 10mg  LDL 123, goal < 70 Consider increase to crestor 20 at discharge  Diabetes type II Uncontrolled Home meds:  metformin, lantus HgbA1c 8.8 in 10/2022, goal < 7.0 CBGs SSI Recommend close follow-up with PCP for better DM control  Dysphagia Patient has post-stroke dysphagia SLP consulted Now on diet  Other Stroke Risk Factors Advanced age Former smoker  Other Active Problems Anxiety - Xanax home med Thyroid Disease, synthroid home medication  Hospital day # 2   Pt seen by Neuro NP/APP and later by MD. Note/plan to be edited by MD as needed.    Gevena Mart DNP, ACNPC-AG  [facility]  STROKE MD NOTE :  I have personally obtained history,examined this patient, reviewed notes, independently viewed imaging studies, participated in medical decision making and plan of care.ROS completed by me personally and pertinent positives fully documented  I have made any additions or clarifications directly to the above note. Agree with note above.  Patient continues to have left hemiparesis visual field loss and sensory loss.  Continue strict blood pressure control with systolic goal less  than 160.  Mobilize out of bed.  Continue ongoing physical occupational speech therapy.  She will likely need to go to inpatient rehab.  Transfer out of ICU later today when bed available.  Long discussion with the patient and son at the bedside and answered questions.  Greater than 50% time during this 50-minute visit was spent on call for coordination of care and discussion patient and care team and answering questions.  Delia Heady, MD Medical Director [facility] Pager: [phone] 12/08/2022 2:24 PM    To contact Stroke Continuity provider, please refer to [email or web address]. After hours, contact General Neurology

## 2022-12-08 NOTE — Progress Notes (Signed)
OT NOTE  L hand edema present. The L 4th digit ring ( diamond) removed and provided to son in the room. Pt agreeable to ring being given to son for safe keeping.   Timmothy Euler, OTR/L  Acute Rehabilitation Services Office: [phone] .

## 2022-12-08 NOTE — Progress Notes (Incomplete)
Physical Therapy Treatment Patient Details Name: Erin Hoffman MRN: [id number] DOB: 1945-08-22 Today's Date: 12/08/2022   History of Present Illness 77 yo female L sided weakness s/p fall MRI ICH and small lesion R parietal bone PMH afib on eliquis HTN DM cholangiocarcinoma with metastasis to R 4th rib, T9 and possibly left humeral head liver mass, anxiety,    PT Comments  ***    If plan is discharge home, recommend the following:     Can travel by private vehicle        Equipment Recommendations       Recommendations for Other Services       Precautions / Restrictions Precautions Precautions: Fall Precaution Comments: Brady at 52 bpm     Mobility  Bed Mobility Overal bed mobility: Needs Assistance Bed Mobility: Rolling, Sidelying to Sit Rolling: Mod assist, +2 for physical assistance, +2 for safety/equipment Sidelying to sit: Mod assist, +2 for physical assistance, HOB elevated       General bed mobility comments: Assisted LE's to EOB, worked on normal movement to roll R and transition up via R elbow with truncal assist/stability.    Transfers Overall transfer level: Needs assistance Equipment used: 2 person hand held assist Transfers: Sit to/from Stand, Bed to chair/wheelchair/BSC Sit to Stand: Mod assist, +2 physical assistance Stand pivot transfers: Mod assist, +2 physical assistance         General transfer comment: assist to pivot L to chair with 2 person assist, 1 face to face and the other from behind    Ambulation/Gait               General Gait Details: NT   Stairs             Wheelchair Mobility     Tilt Bed    Modified Rankin (Stroke Patients Only) Modified Rankin (Stroke Patients Only) Pre-Morbid Rankin Score: No symptoms Modified Rankin: Severe disability     Balance Overall balance assessment: Needs assistance Sitting-balance support: Single extremity supported, Bilateral upper extremity supported, Feet  supported, Feet unsupported Sitting balance-Leahy Scale: Poor Sitting balance - Comments: worked at EOB  >-10 min on balance with reaching, working on moving toward the R and holding midline .  pt generally needing mod assist, but held midline for secs with CGA                                    Cognition Arousal: Alert Behavior During Therapy: WFL for tasks assessed/performed Overall Cognitive Status: Impaired/Different from baseline Area of Impairment: Awareness                           Awareness: Emergent   General Comments: still a little inattentive to L side, but  incrementally improved.        Exercises      General Comments        Pertinent Vitals/Pain Pain Assessment Pain Assessment: Faces Faces Pain Scale: Hurts little more Pain Location: groin Pain Descriptors / Indicators: Sore, Spasm    Home Living                          Prior Function            PT Goals (current goals can now be found in the care plan section)      Frequency  PT Plan      Co-evaluation PT/OT/SLP Co-Evaluation/Treatment: Yes            AM-PAC PT "6 Clicks" Mobility   Outcome Measure                   End of Session               Time:  -     Charges:                            {enter signature dotphrase here}   Erin Hoffman Erin Hoffman 12/08/2022, 12:52 PM

## 2022-12-08 NOTE — Evaluation (Addendum)
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  Emphasis on rolling and transition to sitting, scooting to EOB , >10 min work on balance, reaching, holding midline and safe transfer technique bed to chair with 2 person assist.        If plan is discharge home, recommend the following: Two people to help with walking and/or transfers;Two people to help with bathing/dressing/bathroom;Assistance with cooking/housework;Assist for transportation;Help with stairs or ramp for entrance   Can travel by private vehicle        Equipment Recommendations Other (comment) (TBD)  Recommendations for Other Services  Rehab consult    Functional Status Assessment       Precautions / Restrictions Precautions Precautions: Fall Precaution Comments: Brady at 52 bpm      Mobility  Bed Mobility Overal bed mobility: Needs Assistance Bed Mobility: Rolling, Sidelying to Sit Rolling: Mod assist, +2 for physical assistance, +2 for safety/equipment Sidelying to sit: Mod assist, +2 for physical assistance, HOB elevated       General bed mobility comments: Assisted LE's to EOB, worked on normal movement to roll R and transition up via R elbow with truncal assist/stability.    Transfers Overall transfer level: Needs assistance Equipment used: 2 person hand held assist Transfers: Sit to/from Stand, Bed to chair/wheelchair/BSC Sit to Stand: Mod assist, +2 physical assistance Stand pivot  transfers: Mod assist, +2 physical assistance         General transfer comment: assist to pivot L to chair with 2 person assist, 1 face to face and the other from behind    Ambulation/Gait               General Gait Details: NT  Stairs            Wheelchair Mobility     Tilt Bed    Modified Rankin (Stroke Patients Only) Modified Rankin (Stroke Patients Only) Pre-Morbid Rankin Score: No symptoms Modified Rankin: Severe disability     Balance Overall balance assessment: Needs assistance Sitting-balance support: Single extremity supported, Bilateral upper extremity supported, Feet supported, Feet unsupported Sitting balance-Leahy Scale: Poor Sitting balance - Comments: worked at EOB  >-10 min on balance with reaching, working on moving toward the R and holding midline .  pt generally needing mod assist, but held midline for secs with CGA     Standing balance-Leahy Scale: Zero                               Pertinent Vitals/Pain Pain Assessment Pain Assessment: Faces Faces Pain Scale: Hurts little more Pain Location: groin Pain Descriptors / Indicators: Sore, Spasm    Home Living                          Prior Function  Extremity/Trunk Assessment   Upper Extremity Assessment Upper Extremity Assessment: LUE deficits/detail LUE Deficits / Details: edema present. ring noted to be tighter. Helped get ring off patient. pt verbalize to give ring to son present in the room. son placing ring in his pocket. RN notified and progress note made in the chair. Splint checked for proper fit and elevation needed for edema management. x2 pillows applied at end of session in chair LUE: Subluxation noted LUE Sensation: decreased light touch;decreased proprioception LUE Coordination: decreased fine motor;decreased gross motor    Lower Extremity Assessment Lower Extremity Assessment: Defer to PT evaluation        Communication      Cognition Arousal: Alert Behavior During Therapy: WFL for tasks assessed/performed Overall Cognitive Status: Impaired/Different from baseline Area of Impairment: Awareness                           Awareness: Emergent   General Comments: still a little inattentive to L side, but  incrementally improved.        General Comments General comments (skin integrity, edema, etc.): on RA and VSS    Exercises Other Exercises Other Exercises: warm up A to PROM with graded assist/resistance dependent on the side of body.   Assessment/Plan    PT Assessment    PT Problem List         PT Treatment Interventions      PT Goals (Current goals can be found in the Care Plan section)  Acute Rehab PT Goals PT Goal Formulation: With patient Time For Goal Achievement: 12/21/22 Potential to Achieve Goals: Fair    Frequency Min 1X/week     Co-evaluation PT/OT/SLP Co-Evaluation/Treatment: Yes Reason for Co-Treatment: Complexity of the patient's impairments (multi-system involvement);Necessary to address cognition/behavior during functional activity;For patient/therapist safety;To address functional/ADL transfers   OT goals addressed during session: ADL's and self-care;Proper use of Adaptive equipment and DME;Strengthening/ROM       AM-PAC PT "6 Clicks" Mobility  Outcome Measure Help needed turning from your back to your side while in a flat bed without using bedrails?: A Lot Help needed moving from lying on your back to sitting on the side of a flat bed without using bedrails?: Total Help needed moving to and from a bed to a chair (including a wheelchair)?: Total Help needed standing up from a chair using your arms (e.g., wheelchair or bedside chair)?: Total Help needed to walk in hospital room?: Total Help needed climbing 3-5 steps with a railing? : Total 6 Click Score: 7    End of Session   Activity Tolerance: Patient tolerated treatment  well Patient left: in chair;with chair alarm set;with call bell/phone within reach Nurse Communication: Mobility status PT Visit Diagnosis: Other abnormalities of gait and mobility (R26.89);Hemiplegia and hemiparesis;Other symptoms and signs involving the nervous system (R29.898) Hemiplegia - Right/Left: Left Hemiplegia - dominant/non-dominant: Non-dominant Hemiplegia - caused by: Nontraumatic intracerebral hemorrhage    Time: 1141-1207 PT Time Calculation (min) (ACUTE ONLY): 26 min   Charges:     PT Treatments $Neuromuscular Re-education: 8-22 mins PT General Charges $$ ACUTE PT VISIT: 1 Visit         12/08/2022  Jacinto Halim., PT Acute Rehabilitation Services [phone]  (office)  [name] Martine Bleecker 12/08/2022, 2:23 PM

## 2022-12-08 NOTE — Progress Notes (Signed)
HR dropped to 45-50BPM. Doctor notified.No new orders given. Will continue to monitor.     12/08/22 1600  Vitals  BP 130/73  MAP (mmHg) 89  Pulse Rate (!) 48  ECG Heart Rate (!) 46  Resp 17  Level of Consciousness  Level of Consciousness Alert  MEWS COLOR  MEWS Score Color Green  Oxygen Therapy  SpO2 96 %  PCA/Epidural/Spinal Assessment  Respiratory Pattern Regular  Glasgow Coma Scale  Eye Opening 4  Best Verbal Response (NON-intubated) 5  Best Motor Response 6  Glasgow Coma Scale Score 15  MEWS Score  MEWS Temp 0  MEWS Systolic 0  MEWS Pulse 1  MEWS RR 0  MEWS LOC 0  MEWS Score 1    Florinda Marker, RN

## 2022-12-08 NOTE — Progress Notes (Signed)
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 Pt demonstrates some dynamic balance reaching at EOB. Pt with face to face transfer from bed to chair with total (A) for LLE. Recommendation for Patient will benefit from intensive inpatient follow up therapy, >3 hours/day       If plan is discharge home, recommend the following:  Two people to help with walking and/or transfers;Two people to help with bathing/dressing/bathroom   Equipment Recommendations  BSC/3in1;Wheelchair (measurements OT);Wheelchair cushion (measurements OT);Hospital bed;Hoyer lift    Recommendations for Other Services Rehab consult    Precautions / Restrictions Precautions Precautions: Fall Precaution Comments: Brady at 52 bpm       Mobility Bed Mobility Overal bed mobility: Needs Assistance Bed Mobility: Rolling, Sidelying to Sit Rolling: Mod assist, +2 for physical assistance, +2 for safety/equipment Sidelying to sit: Mod assist, +2 for physical assistance, HOB elevated       General bed mobility comments: Assisted LE's to EOB, worked on normal movement to roll R and transition up via R elbow with truncal assist/stability.    Transfers Overall transfer level: Needs assistance Equipment used: 2 person hand held assist Transfers: Sit to/from Stand, Bed to chair/wheelchair/BSC Sit to Stand: Mod assist, +2 physical assistance Stand pivot transfers: Mod assist, +2 physical assistance         General transfer comment: assist to pivot L to chair with 2 person assist, 1 face to face and the other from  behind     Balance Overall balance assessment: Needs assistance Sitting-balance support: Single extremity supported, Bilateral upper extremity supported, Feet supported, Feet unsupported Sitting balance-Leahy Scale: Poor Sitting balance - Comments: worked at EOB  >10 min on balance with reaching, working on moving toward the R and holding midline .  pt generally needing mod assist, but held midline for secs with CGA     Standing balance-Leahy Scale: Zero                             ADL either performed or assessed with clinical judgement   ADL Overall ADL's : Needs assistance/impaired                                       General ADL Comments: session focused on static sitting and basic transfer    Extremity/Trunk Assessment Upper Extremity Assessment Upper Extremity Assessment: LUE deficits/detail LUE Deficits / Details: edema present. ring noted to be tighter. Helped get ring off patient. pt verbalize to give ring to son present in the room. son placing ring in his pocket. RN notified and progress note made in the chair. Splint checked for proper fit and elevation needed for edema management. x2 pillows applied at end of session in chair LUE: Subluxation noted LUE Sensation: decreased light touch;decreased proprioception LUE Coordination: decreased fine motor;decreased gross motor   Lower Extremity Assessment Lower Extremity Assessment: Defer to PT evaluation        Vision   Vision Assessment?:  Vision impaired- to be further tested in functional context Additional Comments: pt needs cues for central aligment and able to find neutral position with "look at me nose to nose"   Perception Perception Perception: Impaired Preception Impairment Details: Inattention/Neglect Perception-Other Comments: L side, needs cues to use R hand to locate L hand   Praxis      Cognition Arousal: Alert Behavior During Therapy: WFL for tasks  assessed/performed Overall Cognitive Status: Impaired/Different from baseline Area of Impairment: Awareness                           Awareness: Emergent   General Comments: L inattention, needs cues for L side awareness.        Exercises      Shoulder Instructions       General Comments on RA and VSS    Pertinent Vitals/ Pain       Pain Assessment Pain Assessment: Faces Faces Pain Scale: Hurts little more Pain Location: groin Pain Descriptors / Indicators: Sore, Spasm Pain Intervention(s): Monitored during session, Repositioned  Home Living                                          Prior Functioning/Environment              Frequency  Min 1X/week        Progress Toward Goals  OT Goals(current goals can now be found in the care plan section)  Progress towards OT goals: Progressing toward goals  Acute Rehab OT Goals Patient Stated Goal: to get better to live at home OT Goal Formulation: With patient Time For Goal Achievement: 12/21/22 Potential to Achieve Goals: Good ADL Goals Pt Will Perform Eating: with set-up;sitting Pt Will Perform Grooming: sitting;with set-up Pt Will Perform Upper Body Bathing: with min assist;sitting Additional ADL Goal #1: pt will place L UE on abdomen prior to all mobility to protect L UE with min cues using R UE  Plan      Co-evaluation    PT/OT/SLP Co-Evaluation/Treatment: Yes Reason for Co-Treatment: Complexity of the patient's impairments (multi-system involvement);Necessary to address cognition/behavior during functional activity;For patient/therapist safety;To address functional/ADL transfers   OT goals addressed during session: ADL's and self-care;Proper use of Adaptive equipment and DME;Strengthening/ROM      AM-PAC OT "6 Clicks" Daily Activity     Outcome Measure   Help from another person eating meals?: A Little Help from another person taking care of personal grooming?: A  Little Help from another person toileting, which includes using toliet, bedpan, or urinal?: A Lot Help from another person bathing (including washing, rinsing, drying)?: A Lot Help from another person to put on and taking off regular upper body clothing?: A Lot Help from another person to put on and taking off regular lower body clothing?: Total 6 Click Score: 13    End of Session Equipment Utilized During Treatment: Gait belt  OT Visit Diagnosis: Unsteadiness on feet (R26.81);Muscle weakness (generalized) (M62.81);Hemiplegia and hemiparesis Hemiplegia - Right/Left: Left Hemiplegia - dominant/non-dominant: Non-Dominant Hemiplegia - caused by: Nontraumatic SAH   Activity Tolerance Patient tolerated treatment well   Patient Left in chair;with chair alarm set;with call bell/phone within reach   Nurse Communication Mobility status;Precautions;Need for lift equipment        Time: 1610-9604 OT Time Calculation (min): 26 min  Charges: OT General Charges $OT  Visit: 1 Visit OT Treatments $Self Care/Home Management : 8-22 mins   Erin Hoffman, OTR/L  Acute Rehabilitation Services Office: [phone] .   Mateo Flow 12/08/2022, 1:12 PM

## 2022-12-09 DIAGNOSIS — E119 Type 2 diabetes mellitus without complications: Secondary | ICD-10-CM | POA: Diagnosis not present

## 2022-12-09 DIAGNOSIS — I610 Nontraumatic intracerebral hemorrhage in hemisphere, subcortical: Secondary | ICD-10-CM | POA: Diagnosis not present

## 2022-12-09 DIAGNOSIS — R21 Rash and other nonspecific skin eruption: Secondary | ICD-10-CM | POA: Diagnosis not present

## 2022-12-09 DIAGNOSIS — Z515 Encounter for palliative care: Secondary | ICD-10-CM | POA: Diagnosis not present

## 2022-12-09 DIAGNOSIS — Z7189 Other specified counseling: Secondary | ICD-10-CM | POA: Diagnosis not present

## 2022-12-09 LAB — GLUCOSE, CAPILLARY
Glucose-Capillary: 136 mg/dL — ABNORMAL HIGH (ref 70–99)
Glucose-Capillary: 173 mg/dL — ABNORMAL HIGH (ref 70–99)
Glucose-Capillary: 167 mg/dL — ABNORMAL HIGH (ref 70–99)
Glucose-Capillary: 286 mg/dL — ABNORMAL HIGH (ref 70–99)

## 2022-12-09 LAB — BASIC METABOLIC PANEL
Anion gap: 11 (ref 5–15)
BUN: 15 mg/dL (ref 8–23)
CO2: 21 mmol/L — ABNORMAL LOW (ref 22–32)
Calcium: 8.7 mg/dL — ABNORMAL LOW (ref 8.9–10.3)
Chloride: 104 mmol/L (ref 98–111)
Creatinine, Ser: 1.04 mg/dL — ABNORMAL HIGH (ref 0.44–1.00)
GFR, Estimated: 55 mL/min — ABNORMAL LOW (ref >60)
Glucose, Bld: 133 mg/dL — ABNORMAL HIGH (ref 70–99)
Potassium: 3.9 mmol/L (ref 3.5–5.1)
Sodium: 136 mmol/L (ref 135–145)

## 2022-12-09 LAB — MAGNESIUM: Magnesium: 1.8 mg/dL (ref 1.7–2.4)

## 2022-12-09 NOTE — Progress Notes (Addendum)
STROKE TEAM PROGRESS NOTE   BRIEF HPI Erin Hoffman is a 77 y.o. 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 Given Andexxa 900mg  in ED and started on cleviprex for BP control.  She is currently on immunotherapy for her cancer, started in November.  NIH on Admission: 18 mRs: 0 ICH Score: 1 for [facility]  SIGNIFICANT HOSPITAL EVENTS  11/27: Presented to [facility] 11/28: Transferred and Admitted to [facility]  Overnight repeat head CT revealed worsening ICH.  INTERIM HISTORY/SUBJECTIVE Son is at the bedside.  No new neurological events overnight Neurological exam is unchanged.  Patient has now moved to neurology stepdown bed.  Palliative care team is following the patient  OBJECTIVE  CBC    Component Value Date/Time   WBC 21.8 (H) 12/07/2022 0614   RBC 3.70 (L) 12/07/2022 0614   HGB 10.9 (L) 12/07/2022 0614   HCT 32.3 (L) 12/07/2022 0614   PLT 284 12/07/2022 0614   MCV 87.3 12/07/2022 0614   MCH 29.5 12/07/2022 0614   MCHC 33.7 12/07/2022 0614   RDW 16.2 (H) 12/07/2022 0614   LYMPHSABS 1.3 12/07/2022 0614   MONOABS 0.1 12/07/2022 0614   EOSABS 0.1 12/07/2022 0614   BASOSABS 0.1 12/07/2022 0614    BMET    Component Value Date/Time   NA 136 12/09/2022 0614   K 3.9 12/09/2022 0614   CL 104 12/09/2022 0614   CO2 21 (L) 12/09/2022 0614   GLUCOSE 133 (H) 12/09/2022 0614   BUN 15 12/09/2022 0614   CREATININE 1.04 (H) 12/09/2022 0614   CALCIUM 8.7 (L) 12/09/2022 0614   GFRNONAA 55 (L) 12/09/2022 0614    IMAGING past 24 hours No results found.  Vitals:   12/09/22 0800 12/09/22 1100 12/09/22 1117 12/09/22 1200  BP:   (!) 136/57 (!) 115/49  Pulse:  (!) 57 (!) 49 (!) 45  Resp:  15 17 12   Temp: 97.6 F (36.4 C)  98.3 F (36.8 C) 98.2 F (36.8 C)  TempSrc: Oral  Oral   SpO2:  94%  97% 94%  Weight:      Height:         PHYSICAL EXAM General:  Alert, well-nourished, well-developed elderly Caucasian lady in no acute distress Psych:  Mood and affect appropriate for situation CV: Regular rate and rhythm on monitor Respiratory:  Regular, unlabored respirations on room air GI: Abdomen soft and nontender   NEURO:  Mental Status: Awake and alert oriented x 4 Speech/Language: No aphasia present.  Dysarthria present Left neglect present  Cranial Nerves:  II: PERRL.  Left hemianopia III, IV, VI: Right gaze preference V: Facial sensation decreased on left. VII: Prominent left facial droop. VIII: hearing intact to voice. IX, X: Palate elevates symmetrically. Phonation is normal.  WJ:XBJYNWGN shrug weak but symmetric XII: tongue is midline Motor:  left hemiparesis present.  Left leg with spontaneous posturing with stimulation Right arm and right leg without drift. Tone: is normal and bulk is normal Sensation-sensation is decreased on left arm and left leg. Coordination: FTN intact on right unable to perform on left Gait- deferred  Most Recent NIH 14    ASSESSMENT/PLAN  ICH:  right thalamus ICH s/p Eliquis reversal with Andexxa, etiology:  likely hypertensive bleed in the setting of Eliquis use Code Stroke CT head: 2.3 x  1.4 x 2.8 cm acute intraparenchymal hemorrhage centered at the right thalamus (estimated volume 5 mL). Associated intraventricular extension with small volume blood in the right lateral ventricle.  CT Angio head and neck: No LVO. Interval increase of Right Thalamic ICH, increased from 5ml to 9ml, increased edema with new trace right-to-left shift. CT C-spine: No fracture MRI w/ and w/o: Stable ICH comparison with repeat CTA.  CT repeat 11/29 stable ICH. Slight increase in leftward midline shift,  Mild interval increase in small volume intraventricular hemorrhage. 2D Echo: LVEF 60 to 65% no shunt LDL 123 HgbA1c 8.8 in 10/2022 VTE prophylaxis -  SCDs. Eliquis  prior to admission, now on No antithrombotic due to ICH Therapy recommendations:  CIR Disposition:  pending  Cholangiocarcinoma with metastasis CTA neck Pleural-based metastasis posterior right lung, increased in size from most recent PET-CT with invasion of posterior right 6th rib and right T6 tranverse process and associated right pleural effusion On immunotherapy Pain management MRI brain with contrast Small lesion on right parietal bone.  Recommend follow-up brain MRI in 3 months palliative care on board  Atrial fibrillation Home Meds: eliquis Eliquis reversed with Andexxa Currently rate controlled HOLD anticoagulation due to ICH  Hypertension Home meds:  vasotec Stable Cleviprex drip is currently off Restart home Vasotec 5mg ->10mg , add amlodipine 10 BP goal <160 now  Hyperlipidemia Home meds:  Crestor 10mg  LDL 123, goal < 70 Consider increase to crestor 20 at discharge  Diabetes type II Uncontrolled Home meds:  metformin, lantus HgbA1c 8.8 in 10/2022, goal < 7.0 CBGs SSI Recommend close follow-up with PCP for better DM control  Dysphagia Patient has post-stroke dysphagia SLP consulted Now on diet  Other Stroke Risk Factors Advanced age Former smoker  Other Active Problems Anxiety - Xanax home med Thyroid Disease, synthroid home medication  Hospital day # 3   Pt seen by Neuro NP/APP and later by MD. Note/plan to be edited by MD as needed.    Gevena Mart DNP, ACNPC-AG  [facility] I have personally obtained history,examined this patient, reviewed notes, independently viewed imaging studies, participated in medical decision making and plan of care.ROS completed by me personally and pertinent positives fully documented  I have made any additions or clarifications directly to the above note. Agree with note above.  Patient neurological exam remains unchanged.  She still has significant left hemiparesis.  She remains at risk for  recurrent strokes due to A-fib and now not on anticoagulation due to hemorrhage.  Long discussion with patient and son at the bedside and answered questions.  Continue ongoing therapies and transfer to skilled nursing facility for rehab next week when bed available.  Discussed with Dr.kshitz.  Greater than 50% time during this 35-minute visit was spent in counseling and coordination of care discussion patient and care team and family and answered questions.  Delia Heady, MD Medical Director [facility] Pager: [phone] 12/09/2022 4:01 PM      To contact Stroke Continuity provider, please refer to [email or web address]. After hours, contact General Neurology

## 2022-12-09 NOTE — Plan of Care (Signed)
  Problem: Education: Goal: Knowledge of disease or condition will improve Outcome: Progressing Goal: Knowledge of secondary prevention will improve (MUST DOCUMENT ALL) Outcome: Progressing Goal: Knowledge of patient specific risk factors will improve [name] N/A or DELETE if not current risk factor) Outcome: Progressing   Problem: Intracerebral Hemorrhage Tissue Perfusion: Goal: Complications of Intracerebral Hemorrhage will be minimized Outcome: Progressing   Problem: Coping: Goal: Will verbalize positive feelings about self Outcome: Progressing Goal: Will identify appropriate support needs Outcome: Progressing   Problem: Health Behavior/Discharge Planning: Goal: Ability to manage health-related needs will improve Outcome: Progressing Goal: Goals will be collaboratively established with patient/family Outcome: Progressing   Problem: Self-Care: Goal: Ability to participate in self-care as condition permits will improve Outcome: Progressing Goal: Verbalization of feelings and concerns over difficulty with self-care will improve Outcome: Progressing Goal: Ability to communicate needs accurately will improve Outcome: Progressing   Problem: Nutrition: Goal: Risk of aspiration will decrease Outcome: Progressing Goal: Dietary intake will improve Outcome: Progressing   Problem: Education: Goal: Ability to describe self-care measures that may prevent or decrease complications (Diabetes Survival Skills Education) will improve Outcome: Progressing Goal: Individualized Educational Video(s) Outcome: Progressing   Problem: Coping: Goal: Ability to adjust to condition or change in health will improve Outcome: Progressing   Problem: Fluid Volume: Goal: Ability to maintain a balanced intake and output will improve Outcome: Progressing   Problem: Health Behavior/Discharge Planning: Goal: Ability to identify and utilize available resources and services will improve Outcome:  Progressing Goal: Ability to manage health-related needs will improve Outcome: Progressing   Problem: Metabolic: Goal: Ability to maintain appropriate glucose levels will improve Outcome: Progressing   Problem: Nutritional: Goal: Maintenance of adequate nutrition will improve Outcome: Progressing Goal: Progress toward achieving an optimal weight will improve Outcome: Progressing   Problem: Skin Integrity: Goal: Risk for impaired skin integrity will decrease Outcome: Progressing   Problem: Tissue Perfusion: Goal: Adequacy of tissue perfusion will improve Outcome: Progressing   Problem: Education: Goal: Knowledge of General Education information will improve Description: Including pain rating scale, medication(s)/side effects and non-pharmacologic comfort measures Outcome: Progressing   Problem: Health Behavior/Discharge Planning: Goal: Ability to manage health-related needs will improve Outcome: Progressing   Problem: Clinical Measurements: Goal: Ability to maintain clinical measurements within normal limits will improve Outcome: Progressing Goal: Will remain free from infection Outcome: Progressing Goal: Diagnostic test results will improve Outcome: Progressing Goal: Respiratory complications will improve Outcome: Progressing Goal: Cardiovascular complication will be avoided Outcome: Progressing   Problem: Activity: Goal: Risk for activity intolerance will decrease Outcome: Progressing   Problem: Nutrition: Goal: Adequate nutrition will be maintained Outcome: Progressing   Problem: Coping: Goal: Level of anxiety will decrease Outcome: Progressing   Problem: Elimination: Goal: Will not experience complications related to bowel motility Outcome: Progressing Goal: Will not experience complications related to urinary retention Outcome: Progressing   Problem: Pain Management: Goal: General experience of comfort will improve Outcome: Progressing   Problem:  Safety: Goal: Ability to remain free from injury will improve Outcome: Progressing   Problem: Skin Integrity: Goal: Risk for impaired skin integrity will decrease Outcome: Progressing

## 2022-12-09 NOTE — Progress Notes (Signed)
   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 Presented with left weakness and found to have a right thalamic hemorrhage.   Today's Discussion 12/09/2022  *Please note that this is a verbal dictation therefore any spelling or grammatical errors are due to the "[employer]agon [employer] One" system interpretation.  Chart reviewed inclusive of vital signs, progress notes, laboratory results, and diagnostic images.   I met at bedside with Erin Hoffman this morning. She is getting repositioned by her nurse during my time at bedside. She shares that she can't feel her LLE well and that it "feels like it's not attached to her body". She denies pain, shortness of breath, or nausea.  Discussed patients goals - she shares openness to CIR to see much independence she will be able to regain. We reviewed that it may take time to get an idea of how well she will recover from the stroke.  Questions and concerns addressed/Palliative Support Provided.   Objective Assessment: Vital Signs Vitals:   12/09/22 0800 12/09/22 1117  BP:  (!) 136/57  Pulse:  (!) 49  Resp:  17  Temp: 97.6 F (36.4 C) 98.3 F (36.8 C)  SpO2:  97%    Intake/Output Summary (Last 24 hours) at 12/09/2022 1126 Last data filed at 12/09/2022 0900 Gross per 24 hour  Intake 350 ml  Output 400 ml  Net -50 ml   Last Weight  Most recent update: 12/06/2022 12:21 AM    Weight  84.1 kg (185 lb 6.5 oz)            Gen: Elderly Caucasian female in no acute distress HEENT: Dry mucous membranes CV: Regular rate and rhythm PULM: On room air breathing is even and nonlabored ABD: soft/nontender EXT: Left-sided hemiplegia Neuro: Alert and oriented x3  SUMMARY OF  RECOMMENDATIONS   DNAR/DNI  Continue to allow time for outcomes  Have sent a message to Dr. Ellin Saba to gain insights on how her stroke may affect future cancer treatments  Appreciate PT/OT/Speech involvement  TOC --> Plan for CIR   The Palliative Care Team will continue to offer incremental support  Billing based on MDM: Moderate _____________________________________________________________________________________ Lamarr Lulas [place] Palliative Medicine Team Team Cell Phone: [phone] Please utilize secure chat with additional questions, if there is no response within 30 minutes please call the above phone number  Palliative Medicine Team providers are available by phone from 7am to 7pm daily and can be reached through the team cell phone.  Should this patient require assistance outside of these hours, please call the patient's attending physician.

## 2022-12-09 NOTE — Progress Notes (Signed)
PROGRESS NOTE    Erin Hoffman  NWG:[id number] DOB: 01-13-1945 DOA: 12/05/2022 PCP: [name], MD   Brief Narrative:  77 year old female with history of A-fib on Eliquis, hypertension, hyperlipidemia, diabetes mellitus type 2, prior smoker, cholangiocarcinoma with metastasis to right fourth rib, T9 and possibly left humeral head presented to [facility] ED due to sudden left-sided weakness and fall.  She was found to have intracranial hemorrhage.  Given Andexxa in ED and started on Cleviprex for BP control.  She was transferred to [facility] ICU and admitted under neurology service.  PT recommended CIR.  Care transferred to [facility] service from 12/08/2022 onwards.  Assessment & Plan:   Right thalamus ICH with left-sided weakness -Most likely hypertensive bleed in the setting of Eliquis use.  Status post Eliquis reversal with Andexxa -Initially started on Cleviprex for BP control. She was transferred to [facility] ICU and admitted under neurology service.  -CTA head and neck showed no LVO but intra increase of right thalamic ICH.  CT C-spine showed no fracture.  MRI of brain showed stable ICH comparison with repeat CTA -Repeat CT head on 12/07/2022 showed stable ICH -2D echo showed EF of 60 to 65% with no shunt; LDL 123; A1c 8.8 -Was on Eliquis prior to admission: Now not on any antithrombotics due to ICH -Care transferred to [facility] service from 12/08/2022 onwards. -PT recommending CIR.  CIR following.  Cholangiocarcinoma with mets -On immunotherapy as an outpatient.  MRI brain showed small lesion on right parietal bone and recommended follow-up brain MRI in 3 months -Palliative care following. -Outpatient follow-up with oncology/Dr. Ellin Saba  Hypomagnesemia -Improved  Paroxysmal A-fib -Currently mildly bradycardic.  Eliquis on hold.  Hypertension -Off Cleviprex drip.  Blood pressure stable.  Continue amlodipine, enalapril  Hypothyroidism -Continue levothyroxine  Diabetes  mellitus type 2 uncontrolled with hyperglycemia -A1c 8.8.  Blood sugars currently controlled.  Continue CBGs with SSI.  On metformin and Lantus at home  Hyperlipidemia -LDL 123.  On Crestor 10 mg daily at home.  Started on Crestor 20 mg daily.  Dysphagia -Diet as per SLP recommendations  DVT prophylaxis: SCDs Code Status: DNR Family Communication: Son at bedside Disposition Plan: Status is: Inpatient Remains inpatient appropriate because: Of severity of illness.  Need for CIR placement.  Currently stable for discharge to CIR.  Consultants: Neurology/palliative care  Procedures: As above  Antimicrobials: None   Subjective: Patient seen and examined at bedside.  No seizures, agitation or vomiting reported. Objective: Vitals:   12/08/22 1634 12/08/22 2000 12/08/22 2330 12/09/22 0320  BP:  (!) 125/53 116/63 117/61  Pulse: (!) 53 (!) 50 (!) 51 (!) 50  Resp:  14 14 13   Temp:  98.6 F (37 C) 98.2 F (36.8 C) 98 F (36.7 C)  TempSrc: Oral Oral Oral Oral  SpO2:  95% 94% 94%  Weight:      Height:        Intake/Output Summary (Last 24 hours) at 12/09/2022 0736 Last data filed at 12/09/2022 0008 Gross per 24 hour  Intake 200 ml  Output 900 ml  Net -700 ml   Filed Weights   12/06/22 0015 12/06/22 0019  Weight: 84.1 kg 84.1 kg    Examination:  General: Currently on room air.  No distress.  Chronically ill and deconditioned looking. ENT/neck: No thyromegaly.  JVD is not elevated  respiratory: Decreased breath sounds at bases bilaterally with some crackles; no wheezing  CVS: S1-S2 heard, mild intermittent bradycardia present Abdominal: Soft, nontender, slightly distended;  no organomegaly, normal bowel sounds are heard Extremities: Trace lower extremity edema; no cyanosis  CNS: Awake and alert.  Slow to respond.  Left-sided weakness present.   Lymph: No obvious lymphadenopathy Skin: No obvious ecchymosis/lesions  psych: Currently not agitated.  Affect is  flat. musculoskeletal: No obvious joint swelling/deformity    Data Reviewed: I have personally reviewed following labs and imaging studies  CBC: Recent Labs  Lab 12/04/22 0815 12/05/22 2340 12/07/22 0614  WBC 12.4* 22.2* 21.8*  NEUTROABS 10.0* 20.2* 18.5*  HGB 11.8* 11.6* 10.9*  HCT 36.0 36.2 32.3*  MCV 88.0 89.8 87.3  PLT 267 322 284   Basic Metabolic Panel: Recent Labs  Lab 12/04/22 0805 12/05/22 2340 12/07/22 0614 12/08/22 0423 12/09/22 0614  NA 135 132* 138 138 136  K 3.6 4.8 4.0 4.0 3.9  CL 102 101 108 105 104  CO2 21* 21* 20* 22 21*  GLUCOSE 289* 336* 90 145* 133*  BUN 10 33* 20 16 15   CREATININE 0.97 1.08* 0.83 0.83 1.04*  CALCIUM 8.8* 9.1 8.8* 8.8* 8.7*  MG 1.5* 2.1 1.7 1.6* 1.8   GFR: Estimated Creatinine Clearance: 51.3 mL/min (A) (by C-G formula based on SCr of 1.04 mg/dL (H)). Liver Function Tests: Recent Labs  Lab 12/04/22 0805 12/05/22 2340 12/07/22 0614  AST 23 58* 84*  ALT 14 25 45*  ALKPHOS 114 114 109  BILITOT 1.1 1.1 1.1  PROT 6.9 7.3 5.7*  ALBUMIN 3.4* 3.7 2.9*   No results for input(s): "LIPASE", "AMYLASE" in the last 168 hours. No results for input(s): "AMMONIA" in the last 168 hours. Coagulation Profile: Recent Labs  Lab 12/05/22 2340  INR 1.7*   Cardiac Enzymes: No results for input(s): "CKTOTAL", "CKMB", "CKMBINDEX", "TROPONINI" in the last 168 hours. BNP (last 3 results) No results for input(s): "PROBNP" in the last 8760 hours. HbA1C: No results for input(s): "HGBA1C" in the last 72 hours. CBG: Recent Labs  Lab 12/08/22 0722 12/08/22 1233 12/08/22 1548 12/08/22 1713 12/08/22 1946  GLUCAP 101* 184* 191* 201* 148*   Lipid Profile: Recent Labs    12/07/22 0614  CHOL 175  HDL 20*  LDLCALC 123*  TRIG 158*  CHOLHDL 8.8   Thyroid Function Tests: No results for input(s): "TSH", "T4TOTAL", "FREET4", "T3FREE", "THYROIDAB" in the last 72 hours. Anemia Panel: No results for input(s): "VITAMINB12", "FOLATE",  "FERRITIN", "TIBC", "IRON", "RETICCTPCT" in the last 72 hours. Sepsis Labs: No results for input(s): "PROCALCITON", "LATICACIDVEN" in the last 168 hours.  Recent Results (from the past 240 hour(s))  MRSA Next Gen by PCR, Nasal     Status: None   Collection Time: 12/06/22  2:17 AM   Specimen: Nasal Mucosa; Nasal Swab  Result Value Ref Range Status   MRSA by PCR Next Gen NOT DETECTED NOT DETECTED Final    Comment: (NOTE) The GeneXpert MRSA Assay (FDA approved for NASAL specimens only), is one component of a comprehensive MRSA colonization surveillance program. It is not intended to diagnose MRSA infection nor to guide or monitor treatment for MRSA infections. Test performance is not FDA approved in patients less than 5 years old. Performed at [facility], [address], [place], [place] [address]          Radiology Studies: No results found.      Scheduled Meds:  acetaminophen  500 mg Oral Q6H   amLODipine  10 mg Oral Daily   Chlorhexidine Gluconate Cloth  6 each Topical Daily   enalapril  10 mg  Oral Daily   insulin aspart  0-5 Units Subcutaneous QHS   insulin aspart  0-9 Units Subcutaneous TID WC   levothyroxine  137 mcg Oral QAC breakfast   magnesium oxide  400 mg Oral BID   oxyCODONE  5 mg Oral Q6H   pantoprazole  40 mg Oral Daily   rosuvastatin  20 mg Oral QHS   senna-docusate  1 tablet Oral BID   sodium chloride flush  10-40 mL Intracatheter Q12H   Continuous Infusions:        Glade Lloyd, MD [facility] 12/09/2022, 7:36 AM

## 2022-12-09 NOTE — Evaluation (Signed)
Clinical/Bedside Swallow Evaluation Patient Details  Name: Erin Hoffman MRN: [id number] Date of Birth: 04-Apr-1945  Today's Date: 12/09/2022 Time: SLP Start Time (ACUTE ONLY): 1025 SLP Stop Time (ACUTE ONLY): 1035 SLP Time Calculation (min) (ACUTE ONLY): 10 min  Past Medical History:  Past Medical History:  Diagnosis Date   Anemia    Anxiety    Diabetes mellitus    H/O hiatal hernia    Hypertension    Hypothyroidism    Past Surgical History:  Past Surgical History:  Procedure Laterality Date   ABDOMINAL HYSTERECTOMY     CATARACT EXTRACTION W/PHACO Left 06/16/2013   Procedure: CATARACT EXTRACTION PHACO AND INTRAOCULAR LENS PLACEMENT (IOC);  Surgeon: [name] T. Nile Riggs, MD;  Location: [facility] ORS;  Service: Ophthalmology;  Laterality: Left;  CDE:4.65   COLONOSCOPY N/A 10/01/2014   Procedure: COLONOSCOPY;  Surgeon: Malissa Hippo, MD;  Location: [facility] ENDO SUITE;  Service: Endoscopy;  Laterality: N/A;  1125   IR IMAGING GUIDED PORT INSERTION  11/20/2022   TONSILLECTOMY     TUBAL LIGATION     HPI:  77 yo female presenting 11/27 with L sided weakness s/p fall. MRI showed ICH and small lesion R parietal bone. CT Head 11/29 showed interval increase in size of IPH and slight increase in leftward midline shift. 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 She passed  Erin Hoffman but was noted to have pocketed food still in mouth well after PO intake of meal and so RN notified MD, changed patient to puree solids and ordered SLP swallow evaluation.    Assessment / Plan / Recommendation  Clinical Impression  Patient is presenting with clinical s/s of what appears to be a primary oral dysphagia. She has very poor sensation of left side of face and only sensed SLP left side face one time, reporting feeling "some pressure". No overt s/s aspiration any of the PO's (thin liquids (water), puree solids, minced solids (diced  pears)) During oral phase, patient with anterior spillage of PO's on left side as well as pocketing of minced solids on left side buccal cavity and on lingual surface without patient awareness. Oral residuals cleared with cued lingual sweep, bite of puree and sips of thin liquids. SLP recommending to continue with puree solids, thin liquids and will follow for toleration and ability to advance solids. SLP Visit Diagnosis: Dysphagia, oral phase (R13.11)    Aspiration Risk  Mild aspiration risk    Diet Recommendation Dysphagia 1 (Puree);Thin liquid    Liquid Administration via: Cup;Straw Medication Administration: Whole meds with puree Supervision: Full supervision/cueing for compensatory strategies Compensations: Lingual sweep for clearance of pocketing;Follow solids with liquid Postural Changes: Seated upright at 90 degrees    Other  Recommendations Oral Care Recommendations: Oral care BID;Oral care before and after PO    Recommendations for follow up therapy are one component of a multi-disciplinary discharge planning process, led by the attending physician.  Recommendations may be updated based on patient status, additional functional criteria and insurance authorization.  Follow up Recommendations Acute inpatient rehab (3hours/day)      Assistance Recommended at Discharge    Functional Status Assessment Patient has had a recent decline in their functional status and demonstrates the ability to make significant improvements in function in a reasonable and predictable amount of time.  Frequency and Duration min 2x/week  2 weeks       Prognosis Prognosis for improved oropharyngeal function:  Good Barriers to Reach Goals: Cognitive deficits      Swallow Study   General Date of Onset: 12/09/22 HPI: 77 yo female presenting 11/27 with L sided weakness s/p fall. MRI showed ICH and small lesion R parietal bone. CT Head 11/29 showed interval increase in size of IPH and slight increase in  leftward midline shift. 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 She passed  Erin Hoffman but was noted to have pocketed food still in mouth well after PO intake of meal and so RN notified MD, changed patient to puree solids and ordered SLP swallow evaluation. Type of Study: Bedside Swallow Evaluation Previous Swallow Assessment: none found Diet Prior to this Study: Dysphagia 1 (pureed);Thin liquids (Level 0) Temperature Spikes Noted: No Respiratory Status: Room air History of Recent Intubation: No Behavior/Cognition: Alert;Cooperative;Pleasant mood Oral Cavity Assessment: Within Functional Limits Oral Care Completed by SLP: No Oral Cavity - Dentition: Adequate natural dentition Vision: Functional for self-feeding Self-Feeding Abilities: Able to feed self Patient Positioning: Upright in bed Baseline Vocal Quality: Normal Volitional Swallow: Able to elicit    Oral/Motor/Sensory Function Overall Oral Motor/Sensory Function: Mild impairment Facial ROM: Reduced left Facial Symmetry: Abnormal symmetry left Facial Strength: Reduced left Facial Sensation: Reduced left Lingual ROM: Within Functional Limits Lingual Symmetry: Within Functional Limits   Ice Chips Ice chips: Not tested   Thin Liquid Thin Liquid: Within functional limits Presentation: Straw;Self Fed    Nectar Thick     Honey Thick     Puree Puree: Impaired Presentation: Spoon Oral Phase Impairments: Reduced labial seal Oral Phase Functional Implications: Left anterior spillage   Solid     Solid: Impaired Presentation: Spoon Oral Phase Impairments: Reduced labial seal;Impaired mastication Oral Phase Functional Implications: Left anterior spillage;Oral residue;Prolonged oral transit Pharyngeal Phase Impairments: Other (comments) (no overt s/s)     [name], MA, CCC-SLP Speech Therapy

## 2022-12-10 DIAGNOSIS — Z515 Encounter for palliative care: Secondary | ICD-10-CM | POA: Diagnosis not present

## 2022-12-10 DIAGNOSIS — R21 Rash and other nonspecific skin eruption: Secondary | ICD-10-CM | POA: Diagnosis not present

## 2022-12-10 DIAGNOSIS — E119 Type 2 diabetes mellitus without complications: Secondary | ICD-10-CM | POA: Diagnosis not present

## 2022-12-10 DIAGNOSIS — I610 Nontraumatic intracerebral hemorrhage in hemisphere, subcortical: Secondary | ICD-10-CM | POA: Diagnosis not present

## 2022-12-10 LAB — CBC WITH DIFFERENTIAL/PLATELET
Abs Immature Granulocytes: 3.48 10*3/uL — ABNORMAL HIGH (ref 0.00–0.07)
Basophils Absolute: 0 10*3/uL (ref 0.0–0.1)
Basophils Relative: 0 %
Eosinophils Absolute: 0 10*3/uL (ref 0.0–0.5)
Eosinophils Relative: 0 %
HCT: 33.9 % — ABNORMAL LOW (ref 36.0–46.0)
Hemoglobin: 11.2 g/dL — ABNORMAL LOW (ref 12.0–15.0)
Immature Granulocytes: 9 %
Lymphocytes Relative: 5 %
Lymphs Abs: 2 10*3/uL (ref 0.7–4.0)
MCH: 29.1 pg (ref 26.0–34.0)
MCHC: 33 g/dL (ref 30.0–36.0)
MCV: 88.1 fL (ref 80.0–100.0)
Monocytes Absolute: 1.6 10*3/uL — ABNORMAL HIGH (ref 0.1–1.0)
Monocytes Relative: 4 %
Neutro Abs: 32 10*3/uL — ABNORMAL HIGH (ref 1.7–7.7)
Neutrophils Relative %: 82 %
Platelets: 300 10*3/uL (ref 150–400)
RBC: 3.85 MIL/uL — ABNORMAL LOW (ref 3.87–5.11)
RDW: 16 % — ABNORMAL HIGH (ref 11.5–15.5)
Smear Review: NORMAL
WBC Morphology: INCREASED
WBC: 39.2 10*3/uL — ABNORMAL HIGH (ref 4.0–10.5)
nRBC: 0 % (ref 0.0–0.2)

## 2022-12-10 LAB — GLUCOSE, CAPILLARY
Glucose-Capillary: 159 mg/dL — ABNORMAL HIGH (ref 70–99)
Glucose-Capillary: 171 mg/dL — ABNORMAL HIGH (ref 70–99)
Glucose-Capillary: 226 mg/dL — ABNORMAL HIGH (ref 70–99)
Glucose-Capillary: 263 mg/dL — ABNORMAL HIGH (ref 70–99)

## 2022-12-10 LAB — BASIC METABOLIC PANEL
Anion gap: 9 (ref 5–15)
BUN: 15 mg/dL (ref 8–23)
CO2: 22 mmol/L (ref 22–32)
Calcium: 8.7 mg/dL — ABNORMAL LOW (ref 8.9–10.3)
Chloride: 106 mmol/L (ref 98–111)
Creatinine, Ser: 0.87 mg/dL (ref 0.44–1.00)
GFR, Estimated: >60 mL/min (ref >60)
Glucose, Bld: 185 mg/dL — ABNORMAL HIGH (ref 70–99)
Potassium: 4.1 mmol/L (ref 3.5–5.1)
Sodium: 137 mmol/L (ref 135–145)

## 2022-12-10 LAB — MAGNESIUM: Magnesium: 1.6 mg/dL — ABNORMAL LOW (ref 1.7–2.4)

## 2022-12-10 NOTE — Plan of Care (Signed)
  Problem: Education: Goal: Knowledge of disease or condition will improve Outcome: Progressing Goal: Knowledge of secondary prevention will improve (MUST DOCUMENT ALL) Outcome: Progressing Goal: Knowledge of patient specific risk factors will improve [name] N/A or DELETE if not current risk factor) Outcome: Progressing   Problem: Intracerebral Hemorrhage Tissue Perfusion: Goal: Complications of Intracerebral Hemorrhage will be minimized Outcome: Progressing   Problem: Coping: Goal: Will verbalize positive feelings about self Outcome: Progressing Goal: Will identify appropriate support needs Outcome: Progressing

## 2022-12-10 NOTE — Plan of Care (Signed)
Florinda Marker, RN

## 2022-12-10 NOTE — Progress Notes (Signed)
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  She was found to have intracranial hemorrhage.  Given Andexxa in ED and started on Cleviprex for BP control.  She was transferred to [facility] ICU and admitted under neurology service.  PT recommended CIR.  Care transferred to [facility] service from 12/08/2022 onwards.  Assessment & Plan:   Right thalamus ICH with left-sided weakness -Most likely hypertensive bleed in the setting of Eliquis use.  Status post Eliquis reversal with Andexxa -Initially started on Cleviprex for BP control. She was transferred to [facility] ICU and admitted under neurology service.  -CTA head and neck showed no LVO but intra increase of right thalamic ICH.  CT C-spine showed no fracture.  MRI of brain showed stable ICH comparison with repeat CTA -Repeat CT head on 12/07/2022 showed stable ICH -2D echo showed EF of 60 to 65% with no shunt; LDL 123; A1c 8.8 -Was on Eliquis prior to admission: Now not on any antithrombotics due to ICH -Care transferred to [facility] service from 12/08/2022 onwards. -PT recommending CIR.  CIR following.  Cholangiocarcinoma with mets -On immunotherapy as an outpatient.  MRI brain showed small lesion on right parietal bone and recommended follow-up brain MRI in 3 months -Palliative care following. -Outpatient follow-up with oncology/Dr. Ellin Saba  Hypomagnesemia -Improved  Paroxysmal A-fib -Currently mildly bradycardic.  Eliquis on hold.  Leukocytosis--questionable cause.  WBCs worsening.  No signs of infection.  Monitor.  Hypertension -Off Cleviprex drip.  Blood pressure stable.   Continue amlodipine, enalapril  Hypothyroidism -Continue levothyroxine  Diabetes mellitus type 2 uncontrolled with hyperglycemia -A1c 8.8.  Blood sugars currently controlled mostly.  Continue CBGs with SSI.  On metformin and Lantus at home  Hyperlipidemia -LDL 123.  On Crestor 10 mg daily at home.  Started on Crestor 20 mg daily.  Dysphagia -Diet as per SLP recommendations  DVT prophylaxis: SCDs Code Status: DNR Family Communication: Son at bedside Disposition Plan: Status is: Inpatient Remains inpatient appropriate because: Of severity of illness.  Need for CIR placement.  Currently stable for discharge to CIR.  Consultants: Neurology/palliative care  Procedures: As above  Antimicrobials: None   Subjective: Patient seen and examined at bedside.  No fever, seizures, vomiting or agitation reported. Objective: Vitals:   12/09/22 1547 12/09/22 1925 12/09/22 2301 12/10/22 0305  BP: 130/62 136/63 (!) 127/57 136/62  Pulse: (!) 58 (!) 58    Resp: 18 17    Temp: 97.9 F (36.6 C) 98.4 F (36.9 C) 97.8 F (36.6 C) 97.6 F (36.4 C)  TempSrc: Oral Oral Axillary Axillary  SpO2: 96% 93%    Weight:      Height:        Intake/Output Summary (Last 24 hours) at 12/10/2022 0744 Last data filed at 12/10/2022 0553 Gross per 24 hour  Intake 470 ml  Output 1250 ml  Net -780 ml   Filed Weights   12/06/22 0015 12/06/22 0019  Weight: 84.1 kg 84.1 kg    Examination:  General: On room air.  No acute distress.  Looks chronically ill and deconditioned. Left-sided weakness present.  Slow to respond; flat affect respiratory: Bilateral decreased breath sounds bases with scattered crackles  CVS: Bradycardic; S1-S2 heard  abdominal: Soft, nontender, distended mildly; No organomegaly; bowel sounds are heard  extremities: Mild bilateral lower extremity edema; no clubbing.       Data Reviewed: I have personally reviewed following labs and imaging studies  CBC: Recent Labs  Lab  12/04/22 0815 12/05/22 2340 12/07/22 0614  WBC 12.4* 22.2* 21.8*  NEUTROABS 10.0* 20.2* 18.5*  HGB 11.8* 11.6* 10.9*  HCT 36.0 36.2 32.3*  MCV 88.0 89.8 87.3  PLT 267 322 284   Basic Metabolic Panel: Recent Labs  Lab 12/04/22 0805 12/05/22 2340 12/07/22 0614 12/08/22 0423 12/09/22 0614  NA 135 132* 138 138 136  K 3.6 4.8 4.0 4.0 3.9  CL 102 101 108 105 104  CO2 21* 21* 20* 22 21*  GLUCOSE 289* 336* 90 145* 133*  BUN 10 33* 20 16 15   CREATININE 0.97 1.08* 0.83 0.83 1.04*  CALCIUM 8.8* 9.1 8.8* 8.8* 8.7*  MG 1.5* 2.1 1.7 1.6* 1.8   GFR: Estimated Creatinine Clearance: 51.3 mL/min (A) (by C-G formula based on SCr of 1.04 mg/dL (H)). Liver Function Tests: Recent Labs  Lab 12/04/22 0805 12/05/22 2340 12/07/22 0614  AST 23 58* 84*  ALT 14 25 45*  ALKPHOS 114 114 109  BILITOT 1.1 1.1 1.1  PROT 6.9 7.3 5.7*  ALBUMIN 3.4* 3.7 2.9*   No results for input(s): "LIPASE", "AMYLASE" in the last 168 hours. No results for input(s): "AMMONIA" in the last 168 hours. Coagulation Profile: Recent Labs  Lab 12/05/22 2340  INR 1.7*   Cardiac Enzymes: No results for input(s): "CKTOTAL", "CKMB", "CKMBINDEX", "TROPONINI" in the last 168 hours. BNP (last 3 results) No results for input(s): "PROBNP" in the last 8760 hours. HbA1C: No results for input(s): "HGBA1C" in the last 72 hours. CBG: Recent Labs  Lab 12/09/22 0802 12/09/22 1124 12/09/22 1547 12/09/22 2142 12/10/22 0734  GLUCAP 136* 173* 167* 286* 159*   Lipid Profile: No results for input(s): "CHOL", "HDL", "LDLCALC", "TRIG", "CHOLHDL", "LDLDIRECT" in the last 72 hours.  Thyroid Function Tests: No results for input(s): "TSH", "T4TOTAL", "FREET4", "T3FREE", "THYROIDAB" in the last 72 hours. Anemia Panel: No results for input(s): "VITAMINB12", "FOLATE", "FERRITIN", "TIBC", "IRON", "RETICCTPCT" in the last 72 hours. Sepsis Labs: No results for input(s): "PROCALCITON", "LATICACIDVEN" in the last 168 hours.  Recent  Results (from the past 240 hour(s))  MRSA Next Gen by PCR, Nasal     Status: None   Collection Time: 12/06/22  2:17 AM   Specimen: Nasal Mucosa; Nasal Swab  Result Value Ref Range Status   MRSA by PCR Next Gen NOT DETECTED NOT DETECTED Final    Comment: (NOTE) The GeneXpert MRSA Assay (FDA approved for NASAL specimens only), is one component of a comprehensive MRSA colonization surveillance program. It is not intended to diagnose MRSA infection nor to guide or monitor treatment for MRSA infections. Test performance is not FDA approved in patients less than 72 years old. Performed at [facility], [address] [place], [place] [address]          Radiology Studies: No results found.      Scheduled Meds:  acetaminophen  500 mg Oral Q6H   amLODipine  10 mg Oral Daily   Chlorhexidine Gluconate Cloth  6 each Topical Daily   enalapril  10 mg Oral Daily   insulin aspart  0-5 Units Subcutaneous QHS   insulin aspart  0-9 Units Subcutaneous TID WC   levothyroxine  137 mcg Oral QAC breakfast  magnesium oxide  400 mg Oral BID   oxyCODONE  5 mg Oral Q6H   pantoprazole  40 mg Oral Daily   rosuvastatin  20 mg Oral QHS   senna-docusate  1 tablet Oral BID   sodium chloride flush  10-40 mL Intracatheter Q12H   Continuous Infusions:        Glade Lloyd, MD [facility] 12/10/2022, 7:44 AM

## 2022-12-10 NOTE — Progress Notes (Signed)
Inpatient Rehab Admissions Coordinator:  Saw pt at bedside. Informed her that began insurance authorization. Will continue to follow.   Gayland Curry, [place], [place] Admissions Coordinator [phone]

## 2022-12-10 NOTE — Progress Notes (Addendum)
   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 Presented with left weakness and found to have a right thalamic hemorrhage.   Today's Discussion 12/10/2022  *Please note that this is a verbal dictation therefore any spelling or grammatical errors are due to the "Dragon Medical One" system interpretation.  Chart reviewed inclusive of vital signs, progress notes, laboratory results, and diagnostic images.   I met at bedside with Erin Hoffman this morning, she was in the company of her son, Erin Hoffman. We reviewed that Dr. Ellin Saba has shared he will follow up with Erin Hoffman as an outpatient to determine next steps in regards to chemotherapy.   Per discussion with Erin Hoffman she feels that day by day there is more feeling in her left upper extremity. She is geared towards acute rehabilitation if accepted.   We discussed concerns with patient "pocketing" food . Per her RN, Erin Hoffman she has eaten well this morning. We reviewed the importance of ongoing speech therapy support for methods of chewing and swallowing in the setting of hemi-paralysis.   Patient son concerned about her overall nutritional intake. I shared that I would request a dietician evaluation.   Questions and concerns addressed/Palliative Support Provided.   Objective Assessment: Vital Signs Vitals:   12/10/22 0734 12/10/22 1137  BP: (!) 126/59 (!) 117/51  Pulse:    Resp: 15 15  Temp: 97.9 F (36.6 C) 98.1 F (36.7 C)  SpO2:      Intake/Output Summary (Last 24 hours) at 12/10/2022 1408 Last data filed at 12/10/2022 4010 Gross per 24 hour  Intake 120 ml  Output 1250 ml  Net -1130 ml   Last Weight  Most recent update: 12/06/2022 12:21 AM    Weight  84.1 kg  (185 lb 6.5 oz)            Gen: Elderly Caucasian female in no acute distress HEENT: Dry mucous membranes CV: Regular rate and rhythm PULM: On room air breathing is even and nonlabored ABD: soft/nontender EXT: Left-sided hemiplegia Neuro: Alert and oriented x3  SUMMARY OF RECOMMENDATIONS   DNAR/DNI  Continue to allow time for outcomes  Appreciate PT/OT/Speech involvement  Appreciate nutrition involvement for supplement recommendations  TOC --> Plan for CIR   The Palliative Care Team will continue to offer incremental support  Billing based on MDM: Moderate _____________________________________________________________________________________ Erin Hoffman [place] Palliative Medicine Team Team Cell Phone: [phone] Please utilize secure chat with additional questions, if there is no response within 30 minutes please call the above phone number  Palliative Medicine Team providers are available by phone from 7am to 7pm daily and can be reached through the team cell phone.  Should this patient require assistance outside of these hours, please call the patient's attending physician.

## 2022-12-10 NOTE — Progress Notes (Signed)
STROKE TEAM PROGRESS NOTE   BRIEF HPI [name] is a 77 y.o. 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 Given Andexxa 900mg  in ED and started on cleviprex for BP control.  She is currently on immunotherapy for her cancer, started in November.  NIH on Admission: 18 mRs: 0 ICH Score: 1 for [facility]  SIGNIFICANT HOSPITAL EVENTS  11/27: Presented to [facility] 11/28: Transferred and Admitted to [facility]  Overnight repeat head CT revealed worsening ICH.  INTERIM HISTORY/SUBJECTIVE No one is at the bedside.  No new neurological events overnight Neurological exam is unchanged.  Vital signs stable  OBJECTIVE  CBC    Component Value Date/Time   WBC 39.2 (H) 12/10/2022 0650   RBC 3.85 (L) 12/10/2022 0650   HGB 11.2 (L) 12/10/2022 0650   HCT 33.9 (L) 12/10/2022 0650   PLT 300 12/10/2022 0650   MCV 88.1 12/10/2022 0650   MCH 29.1 12/10/2022 0650   MCHC 33.0 12/10/2022 0650   RDW 16.0 (H) 12/10/2022 0650   LYMPHSABS 2.0 12/10/2022 0650   MONOABS 1.6 (H) 12/10/2022 0650   EOSABS 0.0 12/10/2022 0650   BASOSABS 0.0 12/10/2022 0650    BMET    Component Value Date/Time   NA 137 12/10/2022 0650   K 4.1 12/10/2022 0650   CL 106 12/10/2022 0650   CO2 22 12/10/2022 0650   GLUCOSE 185 (H) 12/10/2022 0650   BUN 15 12/10/2022 0650   CREATININE 0.87 12/10/2022 0650   CALCIUM 8.7 (L) 12/10/2022 0650   GFRNONAA >60 12/10/2022 0650    IMAGING past 24 hours No results found.  Vitals:   12/09/22 2301 12/10/22 0305 12/10/22 0734 12/10/22 1137  BP: (!) 127/57 136/62 (!) 126/59 (!) 117/51  Pulse:      Resp:   15 15  Temp: 97.8 F (36.6 C) 97.6 F (36.4 C) 97.9 F (36.6 C) 98.1 F (36.7 C)  TempSrc: Axillary Axillary Axillary Oral  SpO2:      Weight:      Height:         PHYSICAL  EXAM General:  Alert, well-nourished, well-developed elderly Caucasian lady in no acute distress Psych:  Mood and affect appropriate for situation CV: Regular rate and rhythm on monitor Respiratory:  Regular, unlabored respirations on room air GI: Abdomen soft and nontender   NEURO:  Mental Status: Awake and alert oriented x 4 Speech/Language: No aphasia present.  Dysarthria present Left neglect present  Cranial Nerves:  II: PERRL.  Left hemianopia III, IV, VI: Right gaze preference V: Facial sensation decreased on left. VII: Prominent left facial droop. VIII: hearing intact to voice. IX, X: Palate elevates symmetrically. Phonation is normal.  VH:QIONGEXB shrug weak but symmetric XII: tongue is midline Motor:  left hemiparesis present.  Left leg with spontaneous posturing with stimulation Right arm and right leg without drift. Tone: is normal and bulk is normal Sensation-sensation is decreased on left arm and left leg. Coordination: FTN intact on right unable to perform on left Gait- deferred  Most Recent NIH 14    ASSESSMENT/PLAN  ICH:  right thalamus ICH s/p Eliquis reversal with Andexxa, etiology:  likely hypertensive bleed in the setting of Eliquis use Code Stroke CT head: 2.3 x 1.4 x 2.8 cm acute intraparenchymal hemorrhage centered at the right thalamus (estimated  volume 5 mL). Associated intraventricular extension with small volume blood in the right lateral ventricle.  CT Angio head and neck: No LVO. Interval increase of Right Thalamic ICH, increased from 5ml to 9ml, increased edema with new trace right-to-left shift. CT C-spine: No fracture MRI w/ and w/o: Stable ICH comparison with repeat CTA.  CT repeat 11/29 stable ICH. Slight increase in leftward midline shift,  Mild interval increase in small volume intraventricular hemorrhage. 2D Echo: LVEF 60 to 65% no shunt LDL 123 HgbA1c 8.8 in 10/2022 VTE prophylaxis - SCDs. Eliquis  prior to admission, now on No  antithrombotic due to ICH Therapy recommendations:  CIR Disposition:  pending  Cholangiocarcinoma with metastasis CTA neck Pleural-based metastasis posterior right lung, increased in size from most recent PET-CT with invasion of posterior right 6th rib and right T6 tranverse process and associated right pleural effusion On immunotherapy Pain management MRI brain with contrast Small lesion on right parietal bone.  Recommend follow-up brain MRI in 3 months palliative care on board  Atrial fibrillation Home Meds: eliquis Eliquis reversed with Andexxa Currently rate controlled HOLD anticoagulation due to ICH  Hypertension Home meds:  vasotec Stable Cleviprex drip is currently off Restart home Vasotec 5mg ->10mg , add amlodipine 10 BP goal <160 now  Hyperlipidemia Home meds:  Crestor 10mg  LDL 123, goal < 70 Consider increase to crestor 20 at discharge  Diabetes type II Uncontrolled Home meds:  metformin, lantus HgbA1c 8.8 in 10/2022, goal < 7.0 CBGs SSI Recommend close follow-up with PCP for better DM control  Dysphagia Patient has post-stroke dysphagia SLP consulted Now on diet  Other Stroke Risk Factors Advanced age Former smoker  Other Active Problems Anxiety - Xanax home med Thyroid Disease, synthroid home medication  Hospital day # 4      Patient neurological exam remains unchanged.  She still has significant left hemiparesis.  She remains at risk for recurrent strokes due to A-fib and now not on anticoagulation due to hemorrhage.  Long discussion with patient and son at the bedside and answered questions.  Continue ongoing therapies and transfer to skilled nursing facility for rehab next week when bed available.  Discussed with Dr.kshitz.  Greater than 50% time during this 25-minute visit was spent in counseling and coordination of care discussion patient and care team and family and answered questions.  Stroke team will sign off.  Kindly call for  questions  Delia Heady, MD Medical Director [facility] Pager: [phone] 12/10/2022 2:56 PM      To contact Stroke Continuity provider, please refer to [email or web address]. After hours, contact General Neurology

## 2022-12-10 NOTE — Progress Notes (Signed)
Physical Therapy Treatment Patient Details Name: Erin Hoffman MRN: [id number] DOB: 1945-06-16 Today's Date: 12/10/2022   History of Present Illness 77 yo female L sided weakness s/p fall MRI ICH and small lesion R parietal bone PMH afib on eliquis HTN DM cholangiocarcinoma with metastasis to R 4th rib, T9 and possibly left humeral head liver mass, anxiety,    PT Comments  Pt slowly progressing toward goals, pt noting more sensation in UE's, therapy noting more spontaneous movement or reactions to mobility on the L side.  Emphasis on warm up, transition to EOB, sitting balance at EOB, sit to stands at EOB x2 with mod assist of 2 persons and transfers to chair with face to face assist.     If plan is discharge home, recommend the following: Two people to help with walking and/or transfers;Two people to help with bathing/dressing/bathroom;Assistance with cooking/housework;Assist for transportation;Help with stairs or ramp for entrance   Can travel by private vehicle        Equipment Recommendations  Other (comment)    Recommendations for Other Services Rehab consult     Precautions / Restrictions Precautions Precautions: Fall Precaution Comments: Brady at 54 bpm     Mobility  Bed Mobility Overal bed mobility: Needs Assistance Bed Mobility: Rolling, Sidelying to Sit Rolling: Max assist Sidelying to sit: Max assist, +2 for physical assistance       General bed mobility comments: assisted L side and with trying for normalized movement    Transfers Overall transfer level: Needs assistance Equipment used: 2 person hand held assist (chairback) Transfers: Sit to/from Stand, Bed to chair/wheelchair/BSC Sit to Stand: Mod assist, +2 physical assistance Stand pivot transfers: Mod assist, +2 physical assistance         General transfer comment: cues for hand placement and technique.  assist with forward and boost to stand and stability for pivot.    Ambulation/Gait                    Stairs             Wheelchair Mobility     Tilt Bed    Modified Rankin (Stroke Patients Only) Modified Rankin (Stroke Patients Only) Pre-Morbid Rankin Score: No symptoms Modified Rankin: Severe disability     Balance Overall balance assessment: Needs assistance Sitting-balance support: Single extremity supported, Bilateral upper extremity supported, Feet supported, Feet unsupported Sitting balance-Leahy Scale: Poor Sitting balance - Comments: Worked on coming from right biased position into midline and holding in a food posture.  Needed significant assist today.   Standing balance support: Single extremity supported, No upper extremity supported, During functional activity Standing balance-Leahy Scale: Zero Standing balance comment: pivot to recliner with +2 for standing attempts                            Cognition Arousal: Alert Behavior During Therapy: WFL for tasks assessed/performed Overall Cognitive Status:  (NT formally)                                          Exercises Other Exercises Other Exercises: warm up A to PROM with graded assist/resistance dependent on the side of body.    General Comments General comments (skin integrity, edema, etc.): VSS on RA, some bradycardia      Pertinent Vitals/Pain Pain Assessment Pain Assessment: Faces Faces  Pain Scale: Hurts even more Pain Location: groin , general with standing. Pain Descriptors / Indicators: Sore, Spasm Pain Intervention(s): Monitored during session, Limited activity within patient's tolerance    Home Living                          Prior Function            PT Goals (current goals can now be found in the care plan section) Acute Rehab PT Goals PT Goal Formulation: With patient Time For Goal Achievement: 12/21/22 Potential to Achieve Goals: Fair Progress towards PT goals: Progressing toward goals    Frequency    Min  1X/week      PT Plan      Co-evaluation              AM-PAC PT "6 Clicks" Mobility   Outcome Measure  Help needed turning from your back to your side while in a flat bed without using bedrails?: A Lot Help needed moving from lying on your back to sitting on the side of a flat bed without using bedrails?: Total Help needed moving to and from a bed to a chair (including a wheelchair)?: Total Help needed standing up from a chair using your arms (e.g., wheelchair or bedside chair)?: Total Help needed to walk in hospital room?: Total Help needed climbing 3-5 steps with a railing? : Total 6 Click Score: 7    End of Session Equipment Utilized During Treatment: Gait belt Activity Tolerance: Patient tolerated treatment well Patient left: in chair;with chair alarm set;with call bell/phone within reach Nurse Communication: Mobility status PT Visit Diagnosis: Other abnormalities of gait and mobility (R26.89);Hemiplegia and hemiparesis;Other symptoms and signs involving the nervous system (R29.898) Hemiplegia - Right/Left: Left Hemiplegia - dominant/non-dominant: Non-dominant Hemiplegia - caused by: Nontraumatic intracerebral hemorrhage     Time: 6606-3016 PT Time Calculation (min) (ACUTE ONLY): 28 min  Charges:    $Therapeutic Activity: 8-22 mins $Neuromuscular Re-education: 8-22 mins PT General Charges $$ ACUTE PT VISIT: 1 Visit                     12/10/2022  Jacinto Halim., PT Acute Rehabilitation Services [phone]  (office)   [name] Kirsten Mckone 12/10/2022, 4:03 PM

## 2022-12-11 DIAGNOSIS — Z7189 Other specified counseling: Secondary | ICD-10-CM | POA: Diagnosis not present

## 2022-12-11 DIAGNOSIS — Z515 Encounter for palliative care: Secondary | ICD-10-CM | POA: Diagnosis not present

## 2022-12-11 DIAGNOSIS — I610 Nontraumatic intracerebral hemorrhage in hemisphere, subcortical: Secondary | ICD-10-CM | POA: Diagnosis not present

## 2022-12-11 LAB — GLUCOSE, CAPILLARY
Glucose-Capillary: 159 mg/dL — ABNORMAL HIGH (ref 70–99)
Glucose-Capillary: 260 mg/dL — ABNORMAL HIGH (ref 70–99)
Glucose-Capillary: 352 mg/dL — ABNORMAL HIGH (ref 70–99)
Glucose-Capillary: 347 mg/dL — ABNORMAL HIGH (ref 70–99)

## 2022-12-11 MED ORDER — ADULT MULTIVITAMIN W/MINERALS CH
1.0000 | ORAL_TABLET | Freq: Every day | ORAL | Status: DC
  Administered 2022-12-11 – 2022-12-19 (×9): 1 via ORAL
  Filled 2022-12-11 (×9): qty 1

## 2022-12-11 MED ORDER — ENSURE ENLIVE PO LIQD
237.0000 mL | Freq: Two times a day (BID) | ORAL | Status: DC
  Administered 2022-12-11 – 2022-12-17 (×11): 237 mL via ORAL

## 2022-12-11 MED ORDER — INSULIN GLARGINE-YFGN 100 UNIT/ML ~~LOC~~ SOLN
10.0000 [IU] | Freq: Every day | SUBCUTANEOUS | Status: DC
  Administered 2022-12-11: 10 [IU] via SUBCUTANEOUS
  Filled 2022-12-11 (×2): qty 0.1

## 2022-12-11 MED ORDER — OXYCODONE HCL 5 MG PO TABS
5.0000 mg | ORAL_TABLET | Freq: Four times a day (QID) | ORAL | Status: DC | PRN
  Administered 2022-12-12 – 2022-12-19 (×17): 5 mg via ORAL
  Filled 2022-12-11 (×18): qty 1

## 2022-12-11 MED ORDER — POLYETHYLENE GLYCOL 3350 17 G PO PACK
17.0000 g | PACK | Freq: Every day | ORAL | Status: DC | PRN
  Administered 2022-12-11 – 2022-12-13 (×2): 17 g via ORAL
  Filled 2022-12-11 (×2): qty 1

## 2022-12-11 NOTE — Progress Notes (Signed)
Inpatient Rehabilitation Admissions Coordinator   I spoke with patient at bedside and then spoke with her son, [name], by phone. He plans for his Mom to live with him and family members to arrange 24/7 assistance as her caregivers. Family is very supportive. I await [employer] determination for possible CIR admit.  Ottie Glazier, RN, MSN Rehab Admissions Coordinator [phone] 12/11/2022 11:52 AM

## 2022-12-11 NOTE — Progress Notes (Signed)
   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 Presented with left weakness and found to have a right thalamic hemorrhage.   Today's Discussion 12/11/2022  *Please note that this is a verbal dictation therefore any spelling or grammatical errors are due to the "Dragon [employer] One" system interpretation.  Chart reviewed inclusive of vital signs, progress notes, laboratory results, and diagnostic images. Tolerating POs well  I met at bedside with Shantise this morning, she is resting comfortably. She shares that she feels well overall and denies pain, shortness of breath, or nausea.   Goals remain to transition to acute rehabilitation once medically optimized and insurance authorization is obtained.   Questions and concerns addressed/Palliative Support Provided.   Objective Assessment: Vital Signs Vitals:   12/11/22 0808 12/11/22 1146  BP: (!) 146/62 (!) 147/55  Pulse: (!) 58 (!) 49  Resp: 15 17  Temp: 98 F (36.7 C)   SpO2: 93% 94%    Intake/Output Summary (Last 24 hours) at 12/11/2022 1211 Last data filed at 12/11/2022 0600 Gross per 24 hour  Intake --  Output 600 ml  Net -600 ml   Last Weight  Most recent update: 12/06/2022 12:21 AM    Weight  84.1 kg (185 lb 6.5 oz)            Gen: Elderly Caucasian female in no acute distress HEENT: Dry mucous membranes CV: Regular rate and rhythm PULM: On room air breathing is even and nonlabored ABD: soft/nontender EXT: Left-sided hemiplegia Neuro: Alert and oriented x3  SUMMARY OF RECOMMENDATIONS   DNAR/DNI  Continue to allow time for outcomes  Appreciate PT/OT/Speech involvement  Appreciate nutrition involvement for supplement  recommendations  TOC --> Plan for CIR   The Palliative Care Team will continue to offer incremental support  Billing based on MDM: Low _____________________________________________________________________________________ Lamarr Lulas [place] Palliative [facility] Team Team Cell Phone: [phone] Please utilize secure chat with additional questions, if there is no response within 30 minutes please call the above phone number  Palliative Medicine Team providers are available by phone from 7am to 7pm daily and can be reached through the team cell phone.  Should this patient require assistance outside of these hours, please call the patient's attending physician.

## 2022-12-11 NOTE — Progress Notes (Signed)
Speech Language Pathology Treatment: Dysphagia  Patient Details Name: Erin Hoffman MRN: [id number] DOB: [birth year] Today's Date: 12/11/2022 Time: 6962-9528 SLP Time Calculation (min) (ACUTE ONLY): 15 min  Assessment / Plan / Recommendation Clinical Impression  Patient seen by SLP for skilled intervention focused on dysphagia management. Patient was awake with lunch meal tray present when SLP entered the room. Less than 25% of the meal had been completed. Patient reported a distaste for the pureed diet resulting in reduced intake. Left side facial sensation improved on this date as compared to last. SLP directly observed patient with dys 2 (minced) consistency. Patient with complete mastication, however, immediate anterior spillage on left side. Given cue to masticate on right side, no further anterior spillage. Left sided buccal cavity residue persists but cleared with cued lingual sweep and bite of puree. Recommend continue dys 1 and thin liquids at this time. SLP plans to directly observe patient with trial advanced tray on later date. ST to continue acutely following for skilled intervention focused on dysphagia management.  HPI HPI: 77 yo female presenting 11/27 with L sided weakness s/p fall. MRI showed ICH and small lesion R parietal bone. CT Head 11/29 showed interval increase in size of IPH and slight increase in leftward midline shift. 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 She passed  [place] swallow with nursing but was noted to have pocketed food still in mouth well after PO intake of meal and so RN notified MD, changed patient to puree solids and ordered SLP swallow evaluation.      SLP Plan  Continue with current plan of care      Recommendations for follow up therapy are one component of a multi-disciplinary discharge planning process, led by the attending physician.  Recommendations may be updated based on  patient status, additional functional criteria and insurance authorization.    Recommendations  Diet recommendations: Dysphagia 1 (puree);Thin liquid Liquids provided via: Cup;Straw Medication Administration: Whole meds with puree Supervision: Staff to assist with self feeding Compensations: Lingual sweep for clearance of pocketing;Follow solids with liquid;Small sips/bites;Slow rate                  Oral care BID;Oral care before and after PO   Frequent or constant Supervision/Assistance Dysphagia, oral phase (R13.11)     Continue with current plan of care     Marline Backbone, B.S., Speech Therapy Student    12/11/2022, 3:59 PM

## 2022-12-11 NOTE — Progress Notes (Addendum)
Nutrition Follow-up  DOCUMENTATION CODES:   Not applicable  INTERVENTION:   - Encourage PO intake on Dysphagia 1 diet  - Diet advancement per SLP - Provide Ensure Enlive BID, each supplement provides 350 kcal and 20 grams of protein. - Provide Magic cup TID with meals, each supplement provides 290 kcal and 9 grams of protein - Provide MVI with minerals daily   NUTRITION DIAGNOSIS:   Inadequate oral intake related to acute illness (stroke) as evidenced by  (Dysphagia 1 diet).   GOAL:   Patient will meet greater than or equal to 90% of their needs   MONITOR:   PO intake, Diet advancement, Supplement acceptance  REASON FOR ASSESSMENT:  Consult Assessment of nutrition requirement/status  ASSESSMENT:   77 y.o F, with PMH of T2DM, HTN, anemia, afib on Eliquis,cholangiocarcinoma of the liver, started chemotherapy on 11/21/22. Presnted with left sided weakness found to have subcortical ICH.  11/13: Started chemotherapy; gemcitabine, cisplatin and durvalumab   - CIR following for possible admission   Pt following with outpatient cancer dietitians. On visit pt was very solemn and only answered questions with a brief yes or no answer. Pt unable to provide much history. She states having a good appetite and was eating 3 small meals per day PTA. Pt is weight stable and pt has not noticed any recent weight loss or trouble chewing or swallowing.   Observed 90% of breakfast tray consumed on visit. Per [employer], pt missed lunch yesterday. Pt is now on automatic trays.  Pt is willing to try Ensure Enlive and Magic cups, prefers berry.  Pt is being followed at [facility] for cancer treatment. Per dietitian note, she had a decent appetite and was experiencing mild constipation. "Unable to have a full bowel movement despite daily miralax." Pt has not had a bowel movement since admission.   Admit weight: 84.1 kg  Current weight: 84.1 kg    Average Meal Intake: 11/30-12/1: 58% intake x  3 recorded meals  Nutritionally Relevant Medications: Scheduled Meds:  acetaminophen  500 mg Oral Q6H   amLODipine  10 mg Oral Daily   Chlorhexidine Gluconate Cloth  6 each Topical Daily   enalapril  10 mg Oral Daily   insulin aspart  0-5 Units Subcutaneous QHS   insulin aspart  0-9 Units Subcutaneous TID WC   levothyroxine  137 mcg Oral QAC breakfast   magnesium oxide  400 mg Oral BID   oxyCODONE  5 mg Oral Q6H   pantoprazole  40 mg Oral Daily   rosuvastatin  20 mg Oral QHS   senna-docusate  1 tablet Oral BID   sodium chloride flush  10-40 mL Intracatheter Q12H   Labs Reviewed: Calcium 8.7, Magnesium 1.6- being replenished/improving CBG ranges from 159-263 mg/dL over the last 24 hours HgbA1c 8.8  NUTRITION - FOCUSED PHYSICAL EXAM:  Flowsheet Row Most Recent Value  Orbital Region No depletion  Upper Arm Region Unable to assess  [Left side weakness and cuff on right arm]  Thoracic and Lumbar Region Mild depletion  Buccal Region Mild depletion  Temple Region Mild depletion  Clavicle and Acromion Bone Region Mild depletion  Scapular Bone Region Mild depletion  Dorsal Hand Mild depletion  Patellar Region Mild depletion  Anterior Thigh Region Mild depletion  Posterior Calf Region No depletion  Edema (RD Assessment) None  Hair Reviewed  Eyes Reviewed  Mouth Reviewed  Skin Reviewed  Nails Reviewed       Diet Order:   Diet Order  DIET - DYS 1 Room service appropriate? Yes; Fluid consistency: Thin  Diet effective now                   EDUCATION NEEDS:   Not appropriate for education at this time  Skin:  Skin Assessment: Reviewed RN Assessment  Last BM:  PTA  Height:   Ht Readings from Last 1 Encounters:  12/06/22 5' 9.5" (1.765 m)    Weight:   Wt Readings from Last 1 Encounters:  12/06/22 84.1 kg    Ideal Body Weight:  68.18 kg  BMI:  Body mass index is 26.99 kg/m.  Estimated Nutritional Needs:   Kcal:  1700-1900  Protein:   100-120 gm  Fluid:  >1.7L   Elliot Dally, RD Registered Dietitian  See Amion for more information

## 2022-12-11 NOTE — Plan of Care (Signed)
  Problem: Education: Goal: Knowledge of disease or condition will improve Outcome: Progressing Goal: Knowledge of secondary prevention will improve (MUST DOCUMENT ALL) Outcome: Progressing Goal: Knowledge of patient specific risk factors will improve Erin Hoffman N/A or DELETE if not current risk factor) Outcome: Progressing   Problem: Intracerebral Hemorrhage Tissue Perfusion: Goal: Complications of Intracerebral Hemorrhage will be minimized Outcome: Progressing   Problem: Coping: Goal: Will verbalize positive feelings about self Outcome: Progressing Goal: Will identify appropriate support needs Outcome: Progressing

## 2022-12-11 NOTE — Progress Notes (Signed)
PROGRESS NOTE    Erin Hoffman  WGN:[id number] DOB: 1945-06-03 DOA: 12/05/2022 PCP: [name], MD   Brief Narrative:  77 year old female with history of A-fib on Eliquis, hypertension, hyperlipidemia, diabetes mellitus type 2, prior smoker, cholangiocarcinoma with metastasis to right fourth rib, T9 and possibly left humeral head presented to [facility] ED due to sudden left-sided weakness and fall.  She was found to have intracranial hemorrhage.  Given Andexxa in ED and started on Cleviprex for BP control.  She was transferred to [facility] ICU and admitted under neurology service.  PT recommended CIR.  Care transferred to [facility] service from 12/08/2022 onwards.  Assessment & Plan:   Right thalamus ICH with left-sided weakness -Most likely hypertensive bleed in the setting of Eliquis use.  Status post Eliquis reversal with Andexxa -Initially started on Cleviprex for BP control. She was transferred to [facility] ICU and admitted under neurology service.  -CTA head and neck showed no LVO but intra increase of right thalamic ICH.  CT C-spine showed no fracture.  MRI of brain showed stable ICH comparison with repeat CTA -Repeat CT head on 12/07/2022 showed stable ICH -2D echo showed EF of 60 to 65% with no shunt; LDL 123; A1c 8.8 -Was on Eliquis prior to admission: Now not on any antithrombotics due to ICH -Care transferred to [facility] service from 12/08/2022 onwards.  Neurology signed off on 12/10/2022. -PT recommending CIR.  CIR following.  Cholangiocarcinoma with mets -On immunotherapy as an outpatient.  MRI brain showed small lesion on right parietal bone and recommended follow-up brain MRI in 3 months -Palliative care following. -Outpatient follow-up with oncology/Dr. Ellin Saba  Hypomagnesemia -Improved  Paroxysmal A-fib -Currently mildly bradycardic.  Eliquis on hold.  Leukocytosis--possibly reactive.  No labs today.  No signs of infection.  Monitor.  Hypertension -Off  Cleviprex drip.  Blood pressure stable.  Continue amlodipine, enalapril  Hypothyroidism -Continue levothyroxine  Diabetes mellitus type 2 uncontrolled with hyperglycemia -A1c 8.8.  Blood sugars intermittently getting elevated.  Continue CBGs with SSI.  On metformin and Lantus at home.  Start long-acting insulin.  Hyperlipidemia -LDL 123.  On Crestor 10 mg daily at home.  Started on Crestor 20 mg daily.  Dysphagia -Diet as per SLP recommendations  DVT prophylaxis: SCDs Code Status: DNR Family Communication: Son at bedside Disposition Plan: Status is: Inpatient Remains inpatient appropriate because: Of severity of illness.  Need for CIR placement.  Currently stable for discharge to CIR.  Consultants: Neurology/palliative care  Procedures: As above  Antimicrobials: None   Subjective: Patient seen and examined at bedside.  No seizures, fever, agitation or vomiting reported.   Objective: Vitals:   12/10/22 1549 12/10/22 1934 12/10/22 2325 12/11/22 0324  BP: 130/63 (!) 140/55 (!) 134/58 (!) 147/67  Pulse: (!) 52 (!) 58 (!) 57   Resp: 19 18 15    Temp: 98.4 F (36.9 C) 98.5 F (36.9 C) 97.7 F (36.5 C) 98.5 F (36.9 C)  TempSrc: Oral Oral Axillary Oral  SpO2: 98% 93% 93%   Weight:      Height:        Intake/Output Summary (Last 24 hours) at 12/11/2022 0742 Last data filed at 12/11/2022 0600 Gross per 24 hour  Intake --  Output 600 ml  Net -600 ml   Filed Weights   12/06/22 0015 12/06/22 0019  Weight: 84.1 kg 84.1 kg    Examination:  General: No distress.  Remains on room air.  Looks chronically ill and deconditioned. Left-sided weakness present.  Slow to respond; flat affect respiratory: Decreased breath sounds at bases bilaterally with some crackles  CVS: S1-S2 heard; remains bradycardic abdominal: Soft, nontender, slightly distended; no organomegaly; normal bowel sounds are heard  extremities: No cyanosis; trace lower extremity edema present     Data  Reviewed: I have personally reviewed following labs and imaging studies  CBC: Recent Labs  Lab 12/04/22 0815 12/05/22 2340 12/07/22 0614 12/10/22 0650  WBC 12.4* 22.2* 21.8* 39.2*  NEUTROABS 10.0* 20.2* 18.5* 32.0*  HGB 11.8* 11.6* 10.9* 11.2*  HCT 36.0 36.2 32.3* 33.9*  MCV 88.0 89.8 87.3 88.1  PLT 267 322 284 300   Basic Metabolic Panel: Recent Labs  Lab 12/05/22 2340 12/07/22 0614 12/08/22 0423 12/09/22 0614 12/10/22 0650  NA 132* 138 138 136 137  K 4.8 4.0 4.0 3.9 4.1  CL 101 108 105 104 106  CO2 21* 20* 22 21* 22  GLUCOSE 336* 90 145* 133* 185*  BUN 33* 20 16 15 15   CREATININE 1.08* 0.83 0.83 1.04* 0.87  CALCIUM 9.1 8.8* 8.8* 8.7* 8.7*  MG 2.1 1.7 1.6* 1.8 1.6*   GFR: Estimated Creatinine Clearance: 61.3 mL/min (by C-G formula based on SCr of 0.87 mg/dL). Liver Function Tests: Recent Labs  Lab 12/04/22 0805 12/05/22 2340 12/07/22 0614  AST 23 58* 84*  ALT 14 25 45*  ALKPHOS 114 114 109  BILITOT 1.1 1.1 1.1  PROT 6.9 7.3 5.7*  ALBUMIN 3.4* 3.7 2.9*   No results for input(s): "LIPASE", "AMYLASE" in the last 168 hours. No results for input(s): "AMMONIA" in the last 168 hours. Coagulation Profile: Recent Labs  Lab 12/05/22 2340  INR 1.7*   Cardiac Enzymes: No results for input(s): "CKTOTAL", "CKMB", "CKMBINDEX", "TROPONINI" in the last 168 hours. BNP (last 3 results) No results for input(s): "PROBNP" in the last 8760 hours. HbA1C: No results for input(s): "HGBA1C" in the last 72 hours. CBG: Recent Labs  Lab 12/09/22 2142 12/10/22 0734 12/10/22 1135 12/10/22 1548 12/10/22 2104  GLUCAP 286* 159* 171* 226* 263*   Lipid Profile: No results for input(s): "CHOL", "HDL", "LDLCALC", "TRIG", "CHOLHDL", "LDLDIRECT" in the last 72 hours.  Thyroid Function Tests: No results for input(s): "TSH", "T4TOTAL", "FREET4", "T3FREE", "THYROIDAB" in the last 72 hours. Anemia Panel: No results for input(s): "VITAMINB12", "FOLATE", "FERRITIN", "TIBC", "IRON",  "RETICCTPCT" in the last 72 hours. Sepsis Labs: No results for input(s): "PROCALCITON", "LATICACIDVEN" in the last 168 hours.  Recent Results (from the past 240 hour(s))  MRSA Next Gen by PCR, Nasal     Status: None   Collection Time: 12/06/22  2:17 AM   Specimen: Nasal Mucosa; Nasal Swab  Result Value Ref Range Status   MRSA by PCR Next Gen NOT DETECTED NOT DETECTED Final    Comment: (NOTE) The GeneXpert MRSA Assay (FDA approved for NASAL specimens only), is one component of a comprehensive MRSA colonization surveillance program. It is not intended to diagnose MRSA infection nor to guide or monitor treatment for MRSA infections. Test performance is not FDA approved in patients less than 17 years old. Performed at [facility], [address] [place], [place] [address]          Radiology Studies: No results found.      Scheduled Meds:  acetaminophen  500 mg Oral Q6H   amLODipine  10 mg Oral Daily   Chlorhexidine Gluconate Cloth  6 each Topical Daily   enalapril  10 mg Oral Daily   insulin aspart  0-5 Units Subcutaneous  QHS   insulin aspart  0-9 Units Subcutaneous TID WC   levothyroxine  137 mcg Oral QAC breakfast   magnesium oxide  400 mg Oral BID   oxyCODONE  5 mg Oral Q6H   pantoprazole  40 mg Oral Daily   rosuvastatin  20 mg Oral QHS   senna-docusate  1 tablet Oral BID   sodium chloride flush  10-40 mL Intracatheter Q12H   Continuous Infusions:        Glade Lloyd, MD [facility] 12/11/2022, 7:42 AM

## 2022-12-11 NOTE — Progress Notes (Signed)
Occupational Therapy Treatment Patient Details Name: Erin Hoffman MRN: [id number] DOB: 10/05/1945 Today's Date: 12/11/2022   History of present illness 77 yo female L sided weakness s/p fall MRI ICH and small lesion R parietal bone PMH afib on eliquis HTN DM cholangiocarcinoma with metastasis to R 4th rib, T9 and possibly left humeral head liver mass, anxiety,   OT comments  Pt showed increased sitting balance with more neutral core and visual tracking with ocular motion only into the L visual field. Pt very engaged and eager to progress with therapy. Pt retaining information from prior session showing functional gains. Recommendation for Patient will benefit from intensive inpatient follow up therapy, >3 hours/day       If plan is discharge home, recommend the following:  Two people to help with walking and/or transfers;Two people to help with bathing/dressing/bathroom   Equipment Recommendations  BSC/3in1;Wheelchair (measurements OT);Wheelchair cushion (measurements OT);Hospital bed;Hoyer lift    Recommendations for Other Services Rehab consult    Precautions / Restrictions Precautions Precautions: Fall       Mobility Bed Mobility Overal bed mobility: Needs Assistance Bed Mobility: Rolling, Supine to Sit Rolling: Used rails, Max assist   Supine to sit: Max assist     General bed mobility comments: cues to bring R LE off bed and (A) to bring trunk and LLE off EOB with pad. pt following initiate cues to rotate neck toward R and reach    Transfers Overall transfer level: Needs assistance Equipment used: 2 person hand held assist Transfers: Sit to/from Stand Sit to Stand: +2 physical assistance, Mod assist, From elevated surface Stand pivot transfers: Mod assist, +2 physical assistance         General transfer comment: pt completed sit<>stand x2 with recliner reversed and bil UE on the top of the recliner. pt requires blocking of LLE     Balance Overall balance  assessment: Needs assistance Sitting-balance support: Single extremity supported, Feet supported Sitting balance-Leahy Scale: Poor Sitting balance - Comments: L lean posterior                                   ADL either performed or assessed with clinical judgement   ADL Overall ADL's : Needs assistance/impaired   Eating/Feeding Details (indicate cue type and reason): eating on arrival with L side mouth pocket                                   General ADL Comments: focused on sitting balance, sit <>stand and pivot transfer to chair    Extremity/Trunk Assessment              Vision   Additional Comments: pt demonstrates occular movement from R visual field to L with neutral head position today.   Perception Perception Perception-Other Comments: pt reaching with R UE and finding L UE and lifting it off pillow   Praxis      Cognition Arousal: Alert Behavior During Therapy: WFL for tasks assessed/performed Overall Cognitive Status: Within Functional Limits for tasks assessed                                          Exercises      Shoulder Instructions  General Comments VSS on RA    Pertinent Vitals/ Pain       Pain Assessment Pain Assessment: No/denies pain  Home Living     Available Help at Discharge: Family;Available 24 hours/day (3 sons, 3 DIL and grandchildren, may hire caregivers also)                                    Prior Functioning/Environment              Frequency  Min 1X/week        Progress Toward Goals  OT Goals(current goals can now be found in the care plan section)  Progress towards OT goals: Progressing toward goals  Acute Rehab OT Goals Patient Stated Goal: to get better OT Goal Formulation: With patient Time For Goal Achievement: 12/21/22 Potential to Achieve Goals: Good ADL Goals Pt Will Perform Eating: with set-up;sitting Pt Will Perform Grooming:  sitting;with set-up Pt Will Perform Upper Body Bathing: with min assist;sitting Additional ADL Goal #1: pt will place L UE on abdomen prior to all mobility to protect L UE with min cues using R UE  Plan      Co-evaluation    PT/OT/SLP Co-Evaluation/Treatment: Yes Reason for Co-Treatment: Complexity of the patient's impairments (multi-system involvement);Necessary to address cognition/behavior during functional activity;For patient/therapist safety;To address functional/ADL transfers   OT goals addressed during session: ADL's and self-care;Proper use of Adaptive equipment and DME;Strengthening/ROM      AM-PAC OT "6 Clicks" Daily Activity     Outcome Measure   Help from another person eating meals?: A Little Help from another person taking care of personal grooming?: A Little Help from another person toileting, which includes using toliet, bedpan, or urinal?: A Lot Help from another person bathing (including washing, rinsing, drying)?: A Lot Help from another person to put on and taking off regular upper body clothing?: A Lot Help from another person to put on and taking off regular lower body clothing?: Total 6 Click Score: 13    End of Session Equipment Utilized During Treatment: Gait belt  OT Visit Diagnosis: Unsteadiness on feet (R26.81);Muscle weakness (generalized) (M62.81) Hemiplegia - Right/Left: Left Hemiplegia - dominant/non-dominant: Non-Dominant Hemiplegia - caused by: Nontraumatic SAH   Activity Tolerance Patient tolerated treatment well   Patient Left in chair;with call bell/phone within reach;with chair alarm set   Nurse Communication Mobility status;Precautions        Time: 4696-2952 OT Time Calculation (min): 26 min  Charges: OT General Charges $OT Visit: 1 Visit OT Treatments $Therapeutic Activity: 8-22 mins   Brynn, OTR/L  Acute Rehabilitation Services Office: [phone] .   Mateo Flow 12/11/2022, 3:47 PM

## 2022-12-11 NOTE — Progress Notes (Signed)
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  Emphasis on rolling, transitions via R UE to sitting  both with mod +2, worked on sitting balance which progressed from mod to CGA for 20-30 sec, standing x2 and standing balance with mod +2 and transfer to the chair with mod +2, all improved over yesterday's session.     If plan is discharge home, recommend the following: Two people to help with walking and/or transfers;Two people to help with bathing/dressing/bathroom;Assistance with cooking/housework;Assist for transportation;Help with stairs or ramp for entrance   Can travel by private vehicle        Equipment Recommendations   (TBD)    Recommendations for Other Services Rehab consult     Precautions / Restrictions Precautions Precautions: Fall     Mobility  Bed Mobility Overal bed mobility: Needs Assistance Bed Mobility: Rolling, Supine to Sit Rolling: Used rails, Max assist   Supine to sit: Max assist     General bed mobility comments: cues to bring R LE off bed and (A) to bring trunk and LLE off EOB with pad. pt following initiate cues to rotate neck toward R and reach    Transfers Overall transfer level: Needs assistance Equipment used: 2 person hand held assist Transfers: Sit to/from Stand Sit to Stand: +2 physical assistance, Mod assist, From elevated surface Stand pivot transfers: Mod assist, +2 physical assistance         General transfer comment: pt completed sit<>stand x2 with recliner reversed and bil UE on the top of the recliner. pt requires blocking of LLE    Ambulation/Gait                    Stairs             Wheelchair Mobility     Tilt Bed    Modified Rankin (Stroke Patients Only) Modified Rankin (Stroke Patients Only) Pre-Morbid Rankin Score: No symptoms Modified Rankin: Severe disability     Balance Overall balance assessment: Needs assistance Sitting-balance support: Single extremity supported, Feet supported Sitting balance-Leahy Scale: Poor Sitting balance - Comments: L lean posterior                                    Cognition Arousal: Alert Behavior During Therapy: WFL for tasks assessed/performed Overall Cognitive Status: Within Functional Limits for tasks assessed                                          Exercises      General Comments General comments (skin integrity, edema, etc.): vss on RA      Pertinent Vitals/Pain Pain Assessment Pain Assessment: No/denies pain    Home Living                          Prior Function            PT Goals (current goals  can now be found in the care plan section) Acute Rehab PT Goals PT Goal Formulation: With patient Time For Goal Achievement: 12/21/22 Potential to Achieve Goals: Fair Progress towards PT goals: Progressing toward goals    Frequency    Min 1X/week      PT Plan      Co-evaluation PT/OT/SLP Co-Evaluation/Treatment: Yes Reason for Co-Treatment: Complexity of the patient's impairments (multi-system involvement);Necessary to address cognition/behavior during functional activity;For patient/therapist safety;To address functional/ADL transfers PT goals addressed during session: Mobility/safety with mobility;Balance OT goals addressed during session: ADL's and self-care;Proper use of Adaptive equipment and DME;Strengthening/ROM      AM-PAC PT "6 Clicks" Mobility   Outcome Measure  Help needed turning from your back to your side while in a flat bed without using bedrails?: A Lot Help needed moving from lying on  your back to sitting on the side of a flat bed without using bedrails?: Total Help needed moving to and from a bed to a chair (including a wheelchair)?: Total Help needed standing up from a chair using your arms (e.g., wheelchair or bedside chair)?: Total Help needed to walk in hospital room?: Total Help needed climbing 3-5 steps with a railing? : Total 6 Click Score: 7    End of Session   Activity Tolerance: Patient tolerated treatment well Patient left: in chair;with call bell/phone within reach Nurse Communication: Mobility status PT Visit Diagnosis: Other abnormalities of gait and mobility (R26.89);Hemiplegia and hemiparesis;Other symptoms and signs involving the nervous system (R29.898) Hemiplegia - Right/Left: Left Hemiplegia - dominant/non-dominant: Non-dominant Hemiplegia - caused by: Nontraumatic intracerebral hemorrhage     Time: 3295-1884 PT Time Calculation (min) (ACUTE ONLY): 26 min  Charges:    $Therapeutic Activity: 8-22 mins PT General Charges $$ ACUTE PT VISIT: 1 Visit                     12/11/2022  Jacinto Halim., PT Acute Rehabilitation Services [phone]  (office)   [name] Jiovanny Burdell 12/11/2022, 5:33 PM

## 2022-12-12 DIAGNOSIS — Z7189 Other specified counseling: Secondary | ICD-10-CM | POA: Diagnosis not present

## 2022-12-12 DIAGNOSIS — I619 Nontraumatic intracerebral hemorrhage, unspecified: Secondary | ICD-10-CM | POA: Diagnosis not present

## 2022-12-12 DIAGNOSIS — I610 Nontraumatic intracerebral hemorrhage in hemisphere, subcortical: Secondary | ICD-10-CM | POA: Diagnosis not present

## 2022-12-12 DIAGNOSIS — Z515 Encounter for palliative care: Secondary | ICD-10-CM | POA: Diagnosis not present

## 2022-12-12 LAB — GLUCOSE, CAPILLARY
Glucose-Capillary: 288 mg/dL — ABNORMAL HIGH (ref 70–99)
Glucose-Capillary: 355 mg/dL — ABNORMAL HIGH (ref 70–99)
Glucose-Capillary: 420 mg/dL — ABNORMAL HIGH (ref 70–99)
Glucose-Capillary: 428 mg/dL — ABNORMAL HIGH (ref 70–99)
Glucose-Capillary: 316 mg/dL — ABNORMAL HIGH (ref 70–99)

## 2022-12-12 LAB — MAGNESIUM: Magnesium: 1.4 mg/dL — ABNORMAL LOW (ref 1.7–2.4)

## 2022-12-12 MED ORDER — INSULIN ASPART 100 UNIT/ML IJ SOLN: 0.0000 [IU] | Freq: Three times a day (TID) | INTRAMUSCULAR | Status: DC

## 2022-12-12 MED ORDER — MAGNESIUM SULFATE 4 GM/100ML IV SOLN
4.0000 g | Freq: Once | INTRAVENOUS | Status: AC
  Administered 2022-12-12: 4 g via INTRAVENOUS
  Filled 2022-12-12: qty 100

## 2022-12-12 MED ORDER — INSULIN GLARGINE-YFGN 100 UNIT/ML ~~LOC~~ SOLN
18.0000 [IU] | Freq: Every day | SUBCUTANEOUS | Status: DC
  Filled 2022-12-12: qty 0.18

## 2022-12-12 MED ORDER — MELATONIN 3 MG PO TABS
3.0000 mg | ORAL_TABLET | Freq: Every day | ORAL | Status: DC
  Administered 2022-12-12 – 2022-12-18 (×6): 3 mg via ORAL
  Filled 2022-12-12 (×6): qty 1

## 2022-12-12 MED ORDER — HYDROXYZINE HCL 10 MG PO TABS
10.0000 mg | ORAL_TABLET | Freq: Three times a day (TID) | ORAL | Status: DC | PRN
  Administered 2022-12-12 – 2022-12-19 (×6): 10 mg via ORAL
  Filled 2022-12-12 (×8): qty 1

## 2022-12-12 MED ORDER — NYSTATIN 100000 UNIT/GM EX POWD
Freq: Three times a day (TID) | CUTANEOUS | Status: DC
  Filled 2022-12-12 (×4): qty 15

## 2022-12-12 MED ORDER — INSULIN GLARGINE-YFGN 100 UNIT/ML ~~LOC~~ SOLN
18.0000 [IU] | Freq: Every day | SUBCUTANEOUS | Status: DC
  Administered 2022-12-12: 18 [IU] via SUBCUTANEOUS
  Filled 2022-12-12 (×2): qty 0.18

## 2022-12-12 MED ORDER — INSULIN ASPART 100 UNIT/ML IJ SOLN
0.0000 [IU] | Freq: Three times a day (TID) | INTRAMUSCULAR | Status: DC
  Administered 2022-12-12: 15 [IU] via SUBCUTANEOUS
  Administered 2022-12-13: 11 [IU] via SUBCUTANEOUS
  Administered 2022-12-13: 8 [IU] via SUBCUTANEOUS
  Administered 2022-12-13: 3 [IU] via SUBCUTANEOUS
  Administered 2022-12-14 (×2): 5 [IU] via SUBCUTANEOUS
  Administered 2022-12-14: 3 [IU] via SUBCUTANEOUS
  Administered 2022-12-15: 11 [IU] via SUBCUTANEOUS
  Administered 2022-12-15: 5 [IU] via SUBCUTANEOUS
  Administered 2022-12-15: 11 [IU] via SUBCUTANEOUS
  Administered 2022-12-16: 8 [IU] via SUBCUTANEOUS
  Administered 2022-12-16: 3 [IU] via SUBCUTANEOUS
  Administered 2022-12-16 – 2022-12-17 (×2): 8 [IU] via SUBCUTANEOUS
  Administered 2022-12-17: 5 [IU] via SUBCUTANEOUS
  Administered 2022-12-18 – 2022-12-19 (×3): 2 [IU] via SUBCUTANEOUS
  Administered 2022-12-19: 5 [IU] via SUBCUTANEOUS

## 2022-12-12 NOTE — Plan of Care (Signed)
  Problem: Education: Goal: Knowledge of disease or condition will improve Outcome: Progressing Goal: Knowledge of secondary prevention will improve (MUST DOCUMENT ALL) Outcome: Progressing Goal: Knowledge of patient specific risk factors will improve [name] N/A or DELETE if not current risk factor) Outcome: Progressing   Problem: Intracerebral Hemorrhage Tissue Perfusion: Goal: Complications of Intracerebral Hemorrhage will be minimized Outcome: Progressing   Problem: Coping: Goal: Will verbalize positive feelings about self Outcome: Progressing Goal: Will identify appropriate support needs Outcome: Progressing

## 2022-12-12 NOTE — Progress Notes (Signed)
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  She was found to have intracranial hemorrhage.  Given Andexxa in ED and started on Cleviprex for BP control.  She was transferred to [facility] ICU and admitted under neurology service.  PT recommended CIR.  Care transferred to [facility] service from 12/08/2022 onwards.      Assessment/Plan:   Right thalamus ICH with left-sided weakness -Most likely hypertensive bleed in the setting of Eliquis use.   Status post Eliquis reversal with Andexxa -Initially started on Cleviprex for BP control. She was transferred to [facility] ICU and admitted under neurology service.  -CTA head and neck showed no LVO but intra increase of right thalamic ICH.   CT C-spine showed no fracture.  MRI of brain showed stable ICH comparison with repeat CTA -Repeat CT head on 12/07/2022 showed stable ICH -2D echo showed EF of 60 to 65% with no shunt; LDL 123; A1c 8.8 -Was on Eliquis prior to admission: Now not on any antithrombotics due to ICH -Care transferred to [facility] service from 12/08/2022 onwards.   Neurology signed off on 12/10/2022. -PT recommending CIR.   -CIR declined by [employer] -Family has filed an appeal   Cholangiocarcinoma with mets -On immunotherapy as an outpatient.   MRI brain showed small lesion on right parietal bone and recommended follow-up brain MRI in 3 months -Palliative care following. -Outpatient follow-up with oncology/Dr. Ellin Saba   Hypomagnesemia -Magnesium still low at 1.4 -Will give magnesium sulfate 4 g IV x 1   Paroxysmal A-fib -Currently mildly  bradycardic.  Eliquis on hold due to intracerebral bleed as above   Leukocytosis --possibly reactive.  No labs today.  No signs of infection.  -Follow CBC in a.m.   Hypertension -Off Cleviprex drip.  Blood pressure stable.  Continue amlodipine, enalapril   Hypothyroidism -Continue levothyroxine   Diabetes mellitus type 2 uncontrolled with hyperglycemia -A1c 8.8.  Blood sugars intermittently getting elevated.  Continue CBGs with SSI.  -On metformin and Lantus at home.   -Increase Semglee to 18 units subcu daily from tonight   Hyperlipidemia -LDL 123.  On Crestor 10 mg daily at home.   Started on Crestor 20 mg daily.   Dysphagia -Started on dysphagia 2 diet     Medications     acetaminophen  500 mg Oral Q6H   amLODipine  10 mg Oral Daily   Chlorhexidine Gluconate Cloth  6 each Topical Daily   enalapril  10 mg Oral Daily   feeding supplement  237 mL Oral BID BM   insulin aspart  0-5 Units Subcutaneous QHS   insulin aspart  0-9 Units Subcutaneous TID WC   insulin glargine-yfgn  18 Units Subcutaneous QHS   levothyroxine  137 mcg Oral QAC breakfast   magnesium oxide  400 mg Oral BID   melatonin  3 mg Oral QHS   multivitamin with minerals  1 tablet Oral Daily   pantoprazole  40 mg Oral Daily   rosuvastatin  20 mg Oral QHS   senna-docusate  1 tablet Oral BID   sodium chloride flush  10-40 mL Intracatheter Q12H     Data Reviewed:   CBG:  Recent Labs  Lab 12/11/22 1223 12/11/22 1540 12/11/22 2124 12/12/22 0741 12/12/22 1123  GLUCAP 260* 352* 347* 288* 355*    SpO2: 93 % O2 Flow Rate (L/min): 2 L/min    Vitals:   12/11/22 2311 12/12/22 0314 12/12/22 0743 12/12/22 1125  BP: (!) 154/66 (!) 159/53 (!) 151/61 (!) 147/58  Pulse: 61 60 (!) 58 60  Resp: 20 18 18 17   Temp: 98.1 F (36.7 C) 97.7 F (36.5 C) 98.5 F (36.9 C) 97.7 F (36.5 C)  TempSrc: Oral Oral Oral Oral  SpO2: 95% 91% 95% 93%  Weight:      Height:          Data Reviewed:  Basic  Metabolic Panel: Recent Labs  Lab 12/05/22 2340 12/07/22 0614 12/08/22 0423 12/09/22 0614 12/10/22 0650 12/12/22 0904  NA 132* 138 138 136 137  --   K 4.8 4.0 4.0 3.9 4.1  --   CL 101 108 105 104 106  --   CO2 21* 20* 22 21* 22  --   GLUCOSE 336* 90 145* 133* 185*  --   BUN 33* 20 16 15 15   --   CREATININE 1.08* 0.83 0.83 1.04* 0.87  --   CALCIUM 9.1 8.8* 8.8* 8.7* 8.7*  --   MG 2.1 1.7 1.6* 1.8 1.6* 1.4*    CBC: Recent Labs  Lab 12/05/22 2340 12/07/22 0614 12/10/22 0650  WBC 22.2* 21.8* 39.2*  NEUTROABS 20.2* 18.5* 32.0*  HGB 11.6* 10.9* 11.2*  HCT 36.2 32.3* 33.9*  MCV 89.8 87.3 88.1  PLT 322 284 300    LFT Recent Labs  Lab 12/05/22 2340 12/07/22 0614  AST 58* 84*  ALT 25 45*  ALKPHOS 114 109  BILITOT 1.1 1.1  PROT 7.3 5.7*  ALBUMIN 3.7 2.9*     Antibiotics: Anti-infectives (From admission, onward)    None        DVT prophylaxis: SCDs  Code Status: DNR  Family Communication: Discussed with family members at bedside   CONSULTS    Subjective   Complains of rash on her back   Objective    Physical Examination:   General: Appears in no acute distress Cardiovascular: S1-S2, regular Respiratory: Lungs clear to auscultation bilaterally Skin-erythematous rash noted on back involving mid and lower back Abdomen: Soft, nontender, no organomegaly Extremities: No edema in the lower extremities Neurologic: Alert, oriented x 3, left hemiparesis   Status is: Inpatient:          [name]   [facility] If 7PM-7AM, please contact night-coverage at [email or web address], Office  [phone]   12/12/2022, 3:13 PM  LOS: 6 days

## 2022-12-12 NOTE — Inpatient Diabetes Management (Signed)
Inpatient Diabetes Program Recommendations  AACE/ADA: New Consensus Statement on Inpatient Glycemic Control (2015)  Target Ranges:  Prepandial:   less than 140 mg/dL      Peak postprandial:   less than 180 mg/dL (1-2 hours)      Critically ill patients:  140 - 180 mg/dL   Lab Results  Component Value Date   GLUCAP 355 (H) 12/12/2022   HGBA1C 8.8 (H) 10/22/2022    Review of Glycemic Control  Latest Reference Range & Units 12/11/22 12:23 12/11/22 15:40 12/11/22 21:24 12/12/22 07:41 12/12/22 11:23  Glucose-Capillary 70 - 99 mg/dL 324 (H) 401 (H) 027 (H) 288 (H) 355 (H)  (H): Data is abnormally high Diabetes history: Type 2 DM Outpatient Diabetes medications: Amaryl 4 mg BID, Lantus 40 units QHS Current orders for Inpatient glycemic control: Novolog 0-5 units at bedtime, Novolog 0-9 units TID, Semglee 10 units QHS  Inpatient Diabetes Program Recommendations:    Consider increasing Semglee to 18 units at bedtime and adding Novolog 4 units TID (assuming patient is consuming >50% of meals)  Thanks, Lujean Rave, MSN, RNC-OB Diabetes Coordinator [phone] (8a-5p)

## 2022-12-12 NOTE — Progress Notes (Signed)
   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 Presented with left weakness and found to have a right thalamic hemorrhage.   Today's Discussion 12/12/2022  *Please note that this is a verbal dictation therefore any spelling or grammatical errors are due to the "Dragon Medical One" system interpretation.  Chart reviewed inclusive of vital signs, progress notes, laboratory results, and diagnostic images. Tolerating POs well  I met at bedside with Erin Hoffman this morning, she shares that she had a bad night in the setting of pain and insomnia. She feels that this morning her pain is improving after receiving oxycodone. We discussed adding some melatonin to better support her sleep also.  Erin Hoffman and I discussed the difficulty of her present situation. We reviewed that she feels a loss of independence. Offered her time to express these feelings.   She shares that she hopes to go to rehabilitation sometime in the near future.   We reviewed if at anytime she is not making great strides that goals of care could be re-evaluated. She is understanding of this. For the time being though she remains hopeful to gain insights on how much improvement she can make.   Questions and concerns addressed/Palliative Support Provided.   Objective Assessment: Vital Signs Vitals:   12/12/22 0314 12/12/22 0743  BP: (!) 159/53 (!) 151/61  Pulse: 60 (!) 58  Resp: 18 18  Temp: 97.7 F (36.5 C) 98.5 F (36.9 C)  SpO2: 91% 95%    Intake/Output Summary (Last 24 hours) at 12/12/2022 1006 Last data filed at 12/12/2022 0315 Gross per 24 hour  Intake --  Output 1175 ml  Net -1175 ml   Last Weight  Most recent update: 12/06/2022 12:21 AM     Weight  84.1 kg (185 lb 6.5 oz)            Gen: Elderly Caucasian female in no acute distress HEENT: Dry mucous membranes CV: Regular rate and rhythm PULM: On room air breathing is even and nonlabored ABD: soft/nontender EXT: Left-sided hemiplegia Neuro: Alert and oriented x3  SUMMARY OF RECOMMENDATIONS   DNAR/DNI  Continue to allow time for outcomes  Appreciate PT/OT/Speech involvement  TOC --> Plan for CIR once insurance authorization is obtained  The Palliative Care Team will continue to offer incremental support --> I will be back on Service Sunday though please call if the PMT is needed sooner  Billing based on MDM: Moderate  _____________________________________________________________________________________ Lamarr Lulas [place] Palliative Medicine Team Team Cell Phone: [phone] Please utilize secure chat with additional questions, if there is no response within 30 minutes please call the above phone number  Palliative Medicine Team providers are available by phone from 7am to 7pm daily and can be reached through the team cell phone.  Should this patient require assistance outside of these hours, please call the patient's attending physician.

## 2022-12-12 NOTE — TOC Progression Note (Signed)
Transition of Care [facility]) - Progression Note    Patient Details  Name: ADIVA SKLAR MRN: [id number] Date of Birth: 08-28-1945  Transition of [facility]) CM/SW Contact  Eduard Roux, [place] Phone Number: 12/12/2022, 4:19 PM  Clinical Narrative:     CSW met with patient at bedside. CSW introduced self and explained role. CSW briefly discussed, short term rehab at SNF has back up plan to CIR. Patient states she is agreeable and has bo preferred SNF choice at this time.   TOC will continue to follow and assist with discharge planning.  TOC will provide bed offers once available.  Antony Blackbird, MSW, LCSW Clinical Social Worker    Expected Discharge Plan: IP Rehab Facility Barriers to Discharge: Continued Medical Work up  Expected Discharge Plan and Services                                               Social Determinants of Health (SDOH) Interventions SDOH Screenings   Food Insecurity: No Food Insecurity (12/06/2022)  Housing: Low Risk  (12/06/2022)  Transportation Needs: No Transportation Needs (12/06/2022)  Utilities: Not At Risk (12/06/2022)  Tobacco Use: Medium Risk (12/05/2022)    Readmission Risk Interventions     No data to display

## 2022-12-12 NOTE — Progress Notes (Signed)
  Inpatient Rehabilitation Admissions Coordinator   We have received a denial from [employer] after peer to peer with Dr Sharl Ma and Dr Logan Bores. I notified son, [name] and patient. They wish to pursue an appeal which I will begin today.  Ottie Glazier, RN, MSN Rehab Admissions Coordinator [phone] 12/12/2022 3:20 PM

## 2022-12-12 NOTE — Progress Notes (Signed)
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  Emphasis on warm up, transition to sitting EOB, extensive work on sitting balance at EOB, sit to stand at EOB x2 with mod assist of 2 persons  and 2 person support to control pivot and descent into the recliner.  Pt needed pillow support for positioning in the chair.  Patient will benefit from intensive inpatient follow up therapy, >3 hours/day    If plan is discharge home, recommend the following: Two people to help with walking and/or transfers;Two people to help with bathing/dressing/bathroom;Assistance with cooking/housework;Assist for transportation;Help with stairs or ramp for entrance   Can travel by private vehicle        Equipment Recommendations  Other (comment) (TBD after last venue)    Recommendations for Other Services Rehab consult     Precautions / Restrictions Precautions Precautions: Fall Precaution Comments: [place]on Foley in the mid to upper 50's     Mobility  Bed Mobility Overal bed mobility: Needs Assistance Bed Mobility: Rolling, Supine to Sit Rolling: Used rails, Max assist Sidelying to sit: Max assist, +2 for physical assistance       General bed mobility comments: cues to bring R LE off bed and (A) to bring trunk and LLE off EOB with pad. pt pushing a bit more than during yesterday's session.    Transfers Overall transfer level: Needs assistance Equipment used: 2 person hand held assist Transfers: Sit to/from Stand Sit to Stand: +2 physical assistance, Mod assist, From elevated surface  (x2) Stand pivot transfers: Mod assist, +2 physical assistance         General transfer comment: pt completed sit<>stand x2 with recliner reversed and bil UE on the top of the recliner. During transfers pt needing light  blocking of LLE    Ambulation/Gait               General Gait Details: NT   Stairs             Wheelchair Mobility     Tilt Bed    Modified Rankin (Stroke Patients Only) Modified Rankin (Stroke Patients Only) Pre-Morbid Rankin Score: No symptoms Modified Rankin: Severe disability     Balance Overall balance assessment: Needs assistance Sitting-balance support: Single extremity supported, Feet supported Sitting balance-Leahy Scale: Poor Sitting balance - Comments: pt tended lean forward and L, having more trouble than yesterday, finding mid point balance and holding for a few seconds. Still though,  pt has improved significantly since evaluation and is able to feel and adjust for balance in midline.   Standing balance support: Single extremity supported, No upper extremity supported, During functional activity Standing balance-Leahy Scale: Poor Standing balance comment: stood x2 at EOB holding to chair back with R UE covering/holding the L UE with the R hand.  With assist, pt able to make adjustments to continue standing at EOB with/without 2 person assist.  2 persons needed to control stand pivot to chair.  Cognition Arousal: Alert Behavior During Therapy: WFL for tasks assessed/performed Overall Cognitive Status: Within Functional Limits for tasks assessed                                          Exercises Other Exercises Other Exercises: warm up A to PROM with graded assist/resistance dependent on the side of body.    General Comments General comments (skin integrity, edema, etc.): vss on RA      Pertinent Vitals/Pain Pain Assessment Faces Pain Scale: Hurts little more Pain  Location: groin , general with standing. Pain Descriptors / Indicators: Sore, Spasm Pain Intervention(s): Monitored during session    Home Living                          Prior Function            PT Goals (current goals can now be found in the care plan section) Acute Rehab PT Goals PT Goal Formulation: With patient Time For Goal Achievement: 12/21/22 Potential to Achieve Goals: Fair Progress towards PT goals: Progressing toward goals    Frequency    Min 1X/week      PT Plan      Co-evaluation PT/OT/SLP Co-Evaluation/Treatment: Yes Reason for Co-Treatment: Complexity of the patient's impairments (multi-system involvement);Necessary to address cognition/behavior during functional activity;For patient/therapist safety;To address functional/ADL transfers PT goals addressed during session: Mobility/safety with mobility;Balance OT goals addressed during session: ADL's and self-care;Proper use of Adaptive equipment and DME;Strengthening/ROM      AM-PAC PT "6 Clicks" Mobility   Outcome Measure  Help needed turning from your back to your side while in a flat bed without using bedrails?: A Lot Help needed moving from lying on your back to sitting on the side of a flat bed without using bedrails?: Total Help needed moving to and from a bed to a chair (including a wheelchair)?: Total Help needed standing up from a chair using your arms (e.g., wheelchair or bedside chair)?: Total Help needed to walk in hospital room?: Total Help needed climbing 3-5 steps with a railing? : Total 6 Click Score: 7    End of Session   Activity Tolerance: Patient tolerated treatment well Patient left: in chair;with call bell/phone within reach;with family/visitor present (SLE entering for swallow assessment) Nurse Communication: Mobility status Hemiplegia - Right/Left: Left Hemiplegia - dominant/non-dominant: Non-dominant Hemiplegia - caused by: Nontraumatic intracerebral hemorrhage      Time: 1247-1311 PT Time Calculation (min) (ACUTE ONLY): 24 min  Charges:    $Neuromuscular Re-education: 8-22 mins PT General Charges $$ ACUTE PT VISIT: 1 Visit                     12/12/2022  Jacinto Halim., PT Acute Rehabilitation Services [phone]  (office)   [name] [name] 12/12/2022, 2:31 PM

## 2022-12-12 NOTE — Progress Notes (Signed)
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 Pt with a skin rash posterior back and around sides. Pt now with symptoms and worsening from prior session. Recommendation for Patient will benefit from intensive inpatient follow up therapy, >3 hours/day       If plan is discharge home, recommend the following:  Two people to help with walking and/or transfers;Two people to help with bathing/dressing/bathroom   Equipment Recommendations  BSC/3in1;Wheelchair (measurements OT);Wheelchair cushion (measurements OT);Hospital bed;Hoyer lift    Recommendations for Other Services Rehab consult    Precautions / Restrictions Precautions Precautions: Fall Precaution Comments: Huston Foley in the mid to upper 50's Restrictions Weight Bearing Restrictions: No       Mobility Bed Mobility Overal bed mobility: Needs Assistance Bed Mobility: Rolling, Supine to Sit Rolling: Used rails, Max assist Sidelying to sit: Max assist, +2 for physical assistance       General bed mobility comments: cues to bring R LE off bed and (A) to bring trunk and LLE off EOB with pad. pt pushing a bit more than during yesterday's session.    Transfers Overall transfer level: Needs assistance Equipment used: 2 person hand held assist Transfers: Sit to/from Stand Sit to Stand: +2 physical assistance, Mod assist, From elevated surface (x2) Stand pivot transfers: Mod assist, +2 physical assistance         General transfer comment: pt completed sit<>stand x2 with recliner reversed and bil UE on the top of the recliner.  During transfers pt needing light  blocking of LLE     Balance Overall balance assessment: Needs assistance Sitting-balance support: Single extremity supported, Feet supported Sitting balance-Leahy Scale: Poor Sitting balance - Comments: pt tended lean forward and L, having more trouble than yesterday, finding mid point balance and holding for a few seconds. Still though,  pt has improved significantly since evaluation and is able to feel and adjust for balance in midline.   Standing balance support: Single extremity supported, No upper extremity supported, During functional activity Standing balance-Leahy Scale: Poor Standing balance comment: stood x2 at EOB holding to chair back with R UE covering/holding the L UE with the R hand.  With assist, pt able to make adjustments to continue standing at EOB with/without 2 person assist.  2 persons needed to control stand pivot to chair.                           ADL either performed or assessed with clinical judgement   ADL Overall ADL's : Needs assistance/impaired                                       General ADL Comments: pt incontinence of stool and lack of awareness. pt noted to have a rash that has spread further on back and around sides. pt is complaining of itching. RN present and aware    Extremity/Trunk Assessment Upper Extremity Assessment LUE Deficits / Details: edema present   Lower Extremity Assessment Lower Extremity  Assessment: Defer to PT evaluation        Vision   Additional Comments: pt looking to the L and seeing OT and says "brynn is here" pt seems surprised to see OT but also tracked L with only a auditory of a voice talking. This shows improvement   Perception Perception Perception: Impaired Preception Impairment Details: Inattention/Neglect   Praxis      Cognition Arousal: Alert Behavior During Therapy: WFL for tasks assessed/performed Overall Cognitive Status: Within Functional  Limits for tasks assessed                                          Exercises      Shoulder Instructions       General Comments vss on RA    Pertinent Vitals/ Pain       Pain Assessment Pain Assessment: Faces Faces Pain Scale: Hurts little more Pain Location: groin , general with standing. Pain Descriptors / Indicators: Sore, Spasm Pain Intervention(s): Monitored during session, Repositioned, Limited activity within patient's tolerance  Home Living                                          Prior Functioning/Environment              Frequency  Min 1X/week        Progress Toward Goals  OT Goals(current goals can now be found in the care plan section)  Progress towards OT goals: Progressing toward goals  Acute Rehab OT Goals Patient Stated Goal: to visit with fmaily OT Goal Formulation: With patient Time For Goal Achievement: 12/21/22 Potential to Achieve Goals: Good ADL Goals Pt Will Perform Eating: with set-up;sitting Pt Will Perform Grooming: sitting;with set-up Pt Will Perform Upper Body Bathing: with min assist;sitting Additional ADL Goal #1: pt will place L UE on abdomen prior to all mobility to protect L UE with min cues using R UE  Plan      Co-evaluation      Reason for Co-Treatment: Complexity of the patient's impairments (multi-system involvement);Necessary to address cognition/behavior during functional activity;For patient/therapist safety;To address functional/ADL transfers PT goals addressed during session: Mobility/safety with mobility;Balance OT goals addressed during session: ADL's and self-care;Proper use of Adaptive equipment and DME;Strengthening/ROM      AM-PAC OT "6 Clicks" Daily Activity     Outcome Measure   Help from another person eating meals?: A Little Help from another person taking care of personal grooming?: A Little Help from another person toileting, which includes using toliet,  bedpan, or urinal?: A Lot Help from another person bathing (including washing, rinsing, drying)?: A Lot Help from another person to put on and taking off regular upper body clothing?: A Lot Help from another person to put on and taking off regular lower body clothing?: Total 6 Click Score: 13    End of Session    OT Visit Diagnosis: Unsteadiness on feet (R26.81);Muscle weakness (generalized) (M62.81) Hemiplegia - Right/Left: Left Hemiplegia - dominant/non-dominant: Non-Dominant Hemiplegia - caused by: Nontraumatic SAH   Activity Tolerance Patient tolerated treatment well   Patient Left in chair;with call bell/phone within reach;with nursing/sitter in room;with family/visitor present   Nurse Communication Mobility status;Precautions        Time: 1247 [phone] OT Time Calculation (min): 24 min  Charges:  OT General Charges $OT Visit: 1 Visit OT Treatments $Self Care/Home Management : 8-22 mins   Brynn, OTR/L  Acute Rehabilitation Services Office: [phone] .   Mateo Flow 12/12/2022, 2:52 PM

## 2022-12-12 NOTE — Progress Notes (Signed)
Speech Language Pathology Treatment: Dysphagia  Patient Details Name: Erin Hoffman MRN: [id number] DOB: 1945-06-24 Today's Date: 12/12/2022 Time: 1315-1330 SLP Time Calculation (min) (ACUTE ONLY): 15 min  Assessment / Plan / Recommendation Clinical Impression  Patient seen by SLP for advanced diet tray trial. Patient was awake and upright in the chair with family present when SLP entered the room. SLP directly observed patient with dys 2 (minced) and thin liquid consistencies. Patient with improved awareness of left sided neglect, lingually sweeping buccal cavity with min-mod verbal cues and following solids with liquids PRN independently. Mastication was complete and timely. POs were discontinued after 4 bites as patient stated she was no longer hungry and appeared to be tiring. SLP recommending initiation of dys 2 (minced) and thin liquids diet at this time. ST will continue to acutely follow for diet toleration monitoring and potnetial for diet advancement.   HPI HPI: 77 yo female presenting 11/27 with L sided weakness s/p fall. MRI showed ICH and small lesion R parietal bone. CT Head 11/29 showed interval increase in size of IPH and slight increase in leftward midline shift. 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 She passed  [place] swallow with nursing but was noted to have pocketed food still in mouth well after PO intake of meal and so RN notified MD, changed patient to puree solids and ordered SLP swallow evaluation.      SLP Plan  Continue with current plan of care      Recommendations for follow up therapy are one component of a multi-disciplinary discharge planning process, led by the attending physician.  Recommendations may be updated based on patient status, additional functional criteria and insurance authorization.    Recommendations  Diet recommendations: Dysphagia 2 (fine chop);Thin liquid Liquids  provided via: Cup;Straw Medication Administration: Whole meds with puree Supervision: Staff to assist with self feeding Compensations: Lingual sweep for clearance of pocketing;Follow solids with liquid;Small sips/bites;Slow rate Postural Changes and/or Swallow Maneuvers: Seated upright 90 degrees                  Oral care BID;Oral care before and after PO   Frequent or constant Supervision/Assistance Dysphagia, oral phase (R13.11)     Continue with current plan of care     Marline Backbone, B.S., Speech Therapy Student    12/12/2022, 1:35 PM

## 2022-12-13 ENCOUNTER — Other Ambulatory Visit (HOSPITAL_COMMUNITY): Payer: Self-pay | Admitting: Hematology

## 2022-12-13 ENCOUNTER — Telehealth: Payer: Self-pay

## 2022-12-13 DIAGNOSIS — E1165 Type 2 diabetes mellitus with hyperglycemia: Secondary | ICD-10-CM

## 2022-12-13 DIAGNOSIS — R21 Rash and other nonspecific skin eruption: Secondary | ICD-10-CM

## 2022-12-13 DIAGNOSIS — E119 Type 2 diabetes mellitus without complications: Secondary | ICD-10-CM | POA: Diagnosis not present

## 2022-12-13 DIAGNOSIS — I610 Nontraumatic intracerebral hemorrhage in hemisphere, subcortical: Secondary | ICD-10-CM | POA: Diagnosis not present

## 2022-12-13 DIAGNOSIS — I4891 Unspecified atrial fibrillation: Secondary | ICD-10-CM

## 2022-12-13 LAB — COMPREHENSIVE METABOLIC PANEL
ALT: 17 U/L (ref 0–44)
AST: 33 U/L (ref 15–41)
Albumin: 2.5 g/dL — ABNORMAL LOW (ref 3.5–5.0)
Alkaline Phosphatase: 190 U/L — ABNORMAL HIGH (ref 38–126)
Anion gap: 11 (ref 5–15)
BUN: 22 mg/dL (ref 8–23)
CO2: 22 mmol/L (ref 22–32)
Calcium: 8.6 mg/dL — ABNORMAL LOW (ref 8.9–10.3)
Chloride: 102 mmol/L (ref 98–111)
Creatinine, Ser: 0.79 mg/dL (ref 0.44–1.00)
GFR, Estimated: >60 mL/min (ref >60)
Glucose, Bld: 189 mg/dL — ABNORMAL HIGH (ref 70–99)
Potassium: 4 mmol/L (ref 3.5–5.1)
Sodium: 135 mmol/L (ref 135–145)
Total Bilirubin: 0.7 mg/dL (ref <1.2)
Total Protein: 5.4 g/dL — ABNORMAL LOW (ref 6.5–8.1)

## 2022-12-13 LAB — GLUCOSE, CAPILLARY
Glucose-Capillary: 190 mg/dL — ABNORMAL HIGH (ref 70–99)
Glucose-Capillary: 280 mg/dL — ABNORMAL HIGH (ref 70–99)
Glucose-Capillary: 330 mg/dL — ABNORMAL HIGH (ref 70–99)
Glucose-Capillary: 346 mg/dL — ABNORMAL HIGH (ref 70–99)

## 2022-12-13 LAB — CBC
HCT: 35.6 % — ABNORMAL LOW (ref 36.0–46.0)
Hemoglobin: 11.9 g/dL — ABNORMAL LOW (ref 12.0–15.0)
MCH: 29.2 pg (ref 26.0–34.0)
MCHC: 33.4 g/dL (ref 30.0–36.0)
MCV: 87.3 fL (ref 80.0–100.0)
Platelets: 211 10*3/uL (ref 150–400)
RBC: 4.08 MIL/uL (ref 3.87–5.11)
RDW: 16.5 % — ABNORMAL HIGH (ref 11.5–15.5)
WBC: 40.6 10*3/uL — ABNORMAL HIGH (ref 4.0–10.5)
nRBC: 0 % (ref 0.0–0.2)

## 2022-12-13 LAB — MAGNESIUM: Magnesium: 2 mg/dL (ref 1.7–2.4)

## 2022-12-13 MED ORDER — INSULIN GLARGINE-YFGN 100 UNIT/ML ~~LOC~~ SOLN
24.0000 [IU] | Freq: Every day | SUBCUTANEOUS | Status: DC
  Administered 2022-12-13: 24 [IU] via SUBCUTANEOUS
  Filled 2022-12-13 (×2): qty 0.24

## 2022-12-13 MED ORDER — GERHARDT'S BUTT CREAM
TOPICAL_CREAM | CUTANEOUS | Status: DC | PRN
  Filled 2022-12-13 (×2): qty 60

## 2022-12-13 MED ORDER — ALUM & MAG HYDROXIDE-SIMETH 200-200-20 MG/5ML PO SUSP
15.0000 mL | ORAL | Status: DC | PRN
  Administered 2022-12-13: 15 mL via ORAL
  Filled 2022-12-13: qty 30

## 2022-12-13 NOTE — Progress Notes (Signed)
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  She was found to have intracranial hemorrhage.  Given Andexxa in ED and started on Cleviprex for BP control.  She was transferred to [facility] ICU and admitted under neurology service.  PT recommended CIR.  [facility] from 12/08/2022 onwards.      Assessment/Plan:   Right thalamus ICH with left-sided weakness -Most likely hypertensive bleed in the setting of Eliquis use.   Status post Eliquis reversal with Andexxa -Initially started on Cleviprex for BP control. She was transferred to [facility] ICU and admitted under neurology service.  -CTA head and neck showed no LVO but intra increase of right thalamic ICH.   CT C-spine showed no fracture.  MRI of brain showed stable ICH comparison with repeat CTA -Repeat CT head on 12/07/2022 showed stable ICH -2D echo showed EF of 60 to 65% with no shunt; LDL 123; A1c 8.8 -Was on Eliquis prior to admission: Now not on any antithrombotics due to ICH -Care transferred to [facility] service from 12/08/2022 onwards.   Neurology signed off on 12/10/2022. -PT recommending CIR.   -CIR declined by [employer] -Family has filed an appeal   Cholangiocarcinoma with mets -On immunotherapy as an outpatient.   MRI brain showed small lesion on right parietal bone and recommended follow-up brain MRI in 3 months -Palliative care following. -Outpatient follow-up with oncology/Dr. Ellin Saba   Hypomagnesemia -Replete   Paroxysmal A-fib -Currently mildly bradycardic.  Eliquis on hold due to intracerebral bleed as above    Leukocytosis --possibly reactive.  No labs today.  No signs of infection.  -Follow CBC in a.m.   Hypertension -Off Cleviprex drip.  Blood pressure stable.  Continue amlodipine, enalapril   Hypothyroidism -Continue levothyroxine   Diabetes mellitus type 2 uncontrolled with hyperglycemia -A1c 8.8.  Blood sugars intermittently getting elevated.  Continue CBGs with SSI.  -On metformin and Lantus at home.   -Change sliding scale to moderate, -Increase Semglee to 24 units subcu daily    Hyperlipidemia -LDL 123.  On Crestor 10 mg daily at home.   Started on Crestor 20 mg daily.   Dysphagia -Started on dysphagia 2 diet  Leukocytosis -WBC elevated to 40,000 -Called and discussed with Dr. Ellin Saba, patient's oncologist at [facility] -He says patient received G-CSF on 11/27, which has caused leukocytosis -No signs of infection, afebrile -Will continue to monitor  Rash -Noted on lower back and thighs -Started on nystatin powder, hydroxyzine -Rash is slowly improving -Will continue to monitor  Medications     acetaminophen  500 mg Oral Q6H   amLODipine  10 mg Oral Daily   Chlorhexidine Gluconate Cloth  6 each Topical Daily   enalapril  10 mg Oral Daily   feeding supplement  237 mL Oral BID BM   insulin aspart  0-15 Units Subcutaneous TID WC   insulin aspart  0-5 Units Subcutaneous QHS   insulin glargine-yfgn  24 Units Subcutaneous QHS   levothyroxine  137 mcg Oral QAC breakfast   melatonin  3 mg Oral QHS   multivitamin with minerals  1 tablet Oral Daily   nystatin   Topical TID  pantoprazole  40 mg Oral Daily   rosuvastatin  20 mg Oral QHS   senna-docusate  1 tablet Oral BID   sodium chloride flush  10-40 mL Intracatheter Q12H     Data Reviewed:   CBG:  Recent Labs  Lab 12/12/22 1607 12/12/22 1608 12/12/22 2108 12/13/22 0824 12/13/22 1117  GLUCAP 420* 428* 316* 190* 280*    SpO2: 96 % O2 Flow Rate (L/min): 2 L/min    Vitals:   12/12/22 2308  12/13/22 0321 12/13/22 0745 12/13/22 1115  BP: (!) 130/54 (!) 148/69 125/60 (!) 155/58  Pulse: (!) 48 (!) 59 (!) 57 64  Resp: 19 16 16 18   Temp: 97.9 F (36.6 C) 98.7 F (37.1 C) 98.2 F (36.8 C) 98.4 F (36.9 C)  TempSrc: Oral Oral Oral Oral  SpO2: 92% 96% 91% 96%  Weight:      Height:          Data Reviewed:  Basic Metabolic Panel: Recent Labs  Lab 12/07/22 0614 12/08/22 0423 12/09/22 0614 12/10/22 0650 12/12/22 0904 12/13/22 0540  NA 138 138 136 137  --  135  K 4.0 4.0 3.9 4.1  --  4.0  CL 108 105 104 106  --  102  CO2 20* 22 21* 22  --  22  GLUCOSE 90 145* 133* 185*  --  189*  BUN 20 16 15 15   --  22  CREATININE 0.83 0.83 1.04* 0.87  --  0.79  CALCIUM 8.8* 8.8* 8.7* 8.7*  --  8.6*  MG 1.7 1.6* 1.8 1.6* 1.4* 2.0    CBC: Recent Labs  Lab 12/07/22 0614 12/10/22 0650 12/13/22 0540  WBC 21.8* 39.2* 40.6*  NEUTROABS 18.5* 32.0*  --   HGB 10.9* 11.2* 11.9*  HCT 32.3* 33.9* 35.6*  MCV 87.3 88.1 87.3  PLT 284 300 211    LFT Recent Labs  Lab 12/07/22 0614 12/13/22 0540  AST 84* 33  ALT 45* 17  ALKPHOS 109 190*  BILITOT 1.1 0.7  PROT 5.7* 5.4*  ALBUMIN 2.9* 2.5*     Antibiotics: Anti-infectives (From admission, onward)    None        DVT prophylaxis: SCDs  Code Status: DNR  Family Communication: Discussed with family members at bedside   CONSULTS    Subjective   Itching has improved.   Objective    Physical Examination:  General-appears in no acute distress Heart-S1-S2, regular, no murmur auscultated Lungs-clear to auscultation bilaterally, no wheezing or crackles auscultated Abdomen-soft, nontender, no organomegaly Extremities-no edema in the lower extremities Neuro-alert, oriented x3, left hemiparesis Skin-faint erythematous rash noted on lower back and thighs   Status is: Inpatient:          [name]   [facility] If 7PM-7AM, please contact night-coverage at [email or web address], Office   [phone]   12/13/2022, 2:32 PM  LOS: 7 days

## 2022-12-13 NOTE — Progress Notes (Addendum)
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 Pt requiring mod-max +2 asssist for transfer-level mobility this date. Pt with heavy L lateral bias in sitting and standing, presents with some RUE pushing tendencies. Pt continues to present with min L inattention, benefits from frequent redirection to L side. PT to continue to follow, plan remains appropriate for post-acute inpatient rehabilitation.    If plan is discharge home, recommend the following: Two people to help with walking and/or transfers;Two people to help with bathing/dressing/bathroom;Assistance with cooking/housework;Assist for transportation;Help with stairs or ramp for entrance   Can travel by private vehicle        Equipment Recommendations   (defer)    Recommendations for Other Services Rehab consult     Precautions / Restrictions Precautions Precautions: Fall Precaution Comments: Huston Foley in the mid to upper 50's, L shoulder sublux Restrictions Weight Bearing Restrictions: No     Mobility  Bed Mobility Overal bed mobility: Needs Assistance Bed Mobility: Rolling, Sidelying to Sit Rolling: Max assist Sidelying to sit: Max assist       General bed mobility comments: assist for trunk and LE management, increased time and heavy L lateral bias in sitting with RUE pushing    Transfers Overall transfer level: Needs assistance Equipment used: 2 person hand held assist Transfers: Sit to/from Stand, Bed to chair/wheelchair/BSC Sit to Stand: +2 physical assistance, Mod assist, From elevated surface Stand pivot transfers: Mod assist, +2  physical assistance         General transfer comment: mod +2 for power up, rise, steady, and pivot towards recliner on pt's R. heavy LLE blocking during trasnfers. Stand x2, from EOB and chair in stedy, stedy not best for pt given heavy leaning but wanted to trial this as option for RN staff    Ambulation/Gait                   Stairs             Wheelchair Mobility     Tilt Bed    Modified Rankin (Stroke Patients Only) Modified Rankin (Stroke Patients Only) Pre-Morbid Rankin Score: No symptoms Modified Rankin: Severe disability     Balance Overall balance assessment: Needs assistance Sitting-balance support: Single extremity supported, Feet supported Sitting balance-Leahy Scale: Poor Sitting balance - Comments: heavy L lateral leaning with RUE pushing presentation, benefits from R hand in lap and PT truncal support   Standing balance support: During functional activity, Bilateral upper extremity supported Standing balance-Leahy Scale: Poor                              Cognition Arousal: Alert Behavior During Therapy: Flat affect Overall Cognitive Status: No family/caregiver present to determine baseline cognitive functioning                                 General Comments: L inattention, slowed processing        Exercises      General Comments  Pertinent Vitals/Pain Pain Assessment Pain Assessment: Faces Faces Pain Scale: Hurts little more Pain Location: groin Pain Descriptors / Indicators: Sore, Spasm Pain Intervention(s): Limited activity within patient's tolerance, Monitored during session, Repositioned    Home Living                          Prior Function            PT Goals (current goals can now be found in the care plan section) Acute Rehab PT Goals PT Goal Formulation: With patient Time For Goal Achievement: 12/21/22 Potential to Achieve Goals: Fair Progress towards PT goals:  Progressing toward goals    Frequency    Min 3X/week      PT Plan      Co-evaluation              AM-PAC PT "6 Clicks" Mobility   Outcome Measure  Help needed turning from your back to your side while in a flat bed without using bedrails?: A Lot Help needed moving from lying on your back to sitting on the side of a flat bed without using bedrails?: Total Help needed moving to and from a bed to a chair (including a wheelchair)?: Total Help needed standing up from a chair using your arms (e.g., wheelchair or bedside chair)?: Total Help needed to walk in hospital room?: Total Help needed climbing 3-5 steps with a railing? : Total 6 Click Score: 7    End of Session   Activity Tolerance: Patient tolerated treatment well Patient left: in chair;with call bell/phone within reach;with family/visitor present;with chair alarm set Nurse Communication: Mobility status PT Visit Diagnosis: Other abnormalities of gait and mobility (R26.89);Hemiplegia and hemiparesis;Other symptoms and signs involving the nervous system (R29.898) Hemiplegia - Right/Left: Left Hemiplegia - dominant/non-dominant: Non-dominant Hemiplegia - caused by: Nontraumatic intracerebral hemorrhage     Time: 9604-5409 PT Time Calculation (min) (ACUTE ONLY): 14 min  Charges:    $Therapeutic Activity: 8-22 mins PT General Charges $$ ACUTE PT VISIT: 1 Visit                     Marye Round, PT DPT Acute Rehabilitation Services Secure Chat Preferred  Office [phone]    Erin Hoffman 12/13/2022, 3:11 PM

## 2022-12-13 NOTE — Inpatient Diabetes Management (Signed)
Inpatient Diabetes Program Recommendations  AACE/ADA: New Consensus Statement on Inpatient Glycemic Control (2015)  Target Ranges:  Prepandial:   less than 140 mg/dL      Peak postprandial:   less than 180 mg/dL (1-2 hours)      Critically ill patients:  140 - 180 mg/dL   Lab Results  Component Value Date   GLUCAP 280 (H) 12/13/2022   HGBA1C 8.8 (H) 10/22/2022    Review of Glycemic Control  Latest Reference Range & Units 12/12/22 11:23 12/12/22 16:07 12/12/22 16:08 12/12/22 21:08 12/13/22 08:24 12/13/22 11:17  Glucose-Capillary 70 - 99 mg/dL 784 (H) 696 (H) 295 (H) 316 (H) 190 (H) 280 (H)   Diabetes history: DM  Outpatient Diabetes medications:  Metformin 500 mg bid Amaryl 4 mg bid Lantus 40 units q HS Current orders for Inpatient glycemic control:  Novolog 0-15 units tid with  meals and HS Semglee 18 units daily  Inpatient Diabetes Program Recommendations:    Consider increasing Semglee to 24 units daily.  Also consider adding Novolog meal coverage 4 units tid with meals (hold if patient eats less than 50% or NPO).   Thanks,   Beryl Meager, RN, BC-ADM Inpatient Diabetes Coordinator Pager [phone]  (8a-5p)

## 2022-12-13 NOTE — Progress Notes (Addendum)
Inpatient Rehabilitation Admissions Coordinator   I await appeal determination for CIR vs SNF.  Ottie Glazier, RN, MSN Rehab Admissions Coordinator [phone] 12/13/2022 11:26 AM  Noted per OP oncology documentation her Cancer dx was made 11/13/22 and was currently undergoing treatment prior to admit.  Ottie Glazier, RN, MSN Rehab Admissions Coordinator [phone] 12/13/2022 6:03 PM

## 2022-12-13 NOTE — Plan of Care (Signed)
  Problem: Education: Goal: Knowledge of disease or condition will improve Outcome: Progressing Goal: Knowledge of secondary prevention will improve (MUST DOCUMENT ALL) Outcome: Progressing Goal: Knowledge of patient specific risk factors will improve [name] N/A or DELETE if not current risk factor) Outcome: Progressing   Problem: Intracerebral Hemorrhage Tissue Perfusion: Goal: Complications of Intracerebral Hemorrhage will be minimized Outcome: Progressing   Problem: Coping: Goal: Will verbalize positive feelings about self Outcome: Progressing Goal: Will identify appropriate support needs Outcome: Progressing

## 2022-12-14 DIAGNOSIS — Z515 Encounter for palliative care: Secondary | ICD-10-CM | POA: Diagnosis not present

## 2022-12-14 DIAGNOSIS — Z7189 Other specified counseling: Secondary | ICD-10-CM | POA: Diagnosis not present

## 2022-12-14 LAB — GLUCOSE, CAPILLARY
Glucose-Capillary: 246 mg/dL — ABNORMAL HIGH (ref 70–99)
Glucose-Capillary: 260 mg/dL — ABNORMAL HIGH (ref 70–99)
Glucose-Capillary: 247 mg/dL — ABNORMAL HIGH (ref 70–99)
Glucose-Capillary: 195 mg/dL — ABNORMAL HIGH (ref 70–99)
Glucose-Capillary: 324 mg/dL — ABNORMAL HIGH (ref 70–99)

## 2022-12-14 MED ORDER — INSULIN GLARGINE-YFGN 100 UNIT/ML ~~LOC~~ SOLN
30.0000 [IU] | Freq: Every day | SUBCUTANEOUS | Status: DC
Start: 2022-12-14 — End: 2022-12-20
  Administered 2022-12-14 – 2022-12-18 (×5): 30 [IU] via SUBCUTANEOUS
  Filled 2022-12-14 (×7): qty 0.3

## 2022-12-14 NOTE — Progress Notes (Addendum)
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  She was found to have intracranial hemorrhage.  Given Andexxa in ED and started on Cleviprex for BP control.  She was transferred to [facility] ICU and admitted under neurology service.  PT recommended CIR.  Care transferred to [facility] service from 12/08/2022 onwards.      Assessment/Plan:   Right thalamus ICH with left-sided weakness -Most likely hypertensive bleed in the setting of Eliquis use.   Status post Eliquis reversal with Andexxa -Initially started on Cleviprex for BP control. She was transferred to [facility] ICU and admitted under neurology service.  -CTA head and neck showed no LVO but intra increase of right thalamic ICH.   CT C-spine showed no fracture.  MRI of brain showed stable ICH comparison with repeat CTA -Repeat CT head on 12/07/2022 showed stable ICH -2D echo showed EF of 60 to 65% with no shunt; LDL 123; A1c 8.8 -Was on Eliquis prior to admission: Now not on any antithrombotics due to ICH -Care transferred to [facility] service from 12/08/2022 onwards.   Neurology signed off on 12/10/2022. -PT recommending CIR.   -CIR declined by [employer] -Family has filed an appeal   Cholangiocarcinoma with mets -On immunotherapy as an outpatient.   MRI brain showed small lesion on right parietal bone and recommended follow-up brain MRI in 3 months -Palliative care following. -Outpatient follow-up with oncology/Dr. Ellin Saba   Hypomagnesemia -Replete   Paroxysmal A-fib -Currently mildly bradycardic.  Eliquis on hold due to intracerebral bleed as above    Leukocytosis --possibly reactive.  No labs today.  No signs of infection.  -Follow CBC in a.m.   Hypertension -Off Cleviprex drip.  Blood pressure stable.  Continue amlodipine, enalapril   Hypothyroidism -Continue levothyroxine   Diabetes mellitus type 2 uncontrolled with hyperglycemia -A1c 8.8.  Blood sugars intermittently getting elevated.  Continue CBGs with SSI.  -On metformin and Lantus at home.   -Change sliding scale to moderate, -Increase Semglee to 30 units subcu daily    Hyperlipidemia -LDL 123.  On Crestor 10 mg daily at home.   Started on Crestor 20 mg daily.   Dysphagia -Started on dysphagia 2 diet  Leukocytosis -WBC elevated to 40,000 -Called and discussed with Dr. Ellin Saba, patient's oncologist at [facility] -He says patient received G-CSF on 11/27, which has caused leukocytosis -No signs of infection, afebrile -Will continue to monitor  Rash with itching -Noted on lower back and thighs -Started on nystatin powder, hydroxyzine -Rash is slowly improving -Will continue to monitor  Medications     acetaminophen  500 mg Oral Q6H   amLODipine  10 mg Oral Daily   Chlorhexidine Gluconate Cloth  6 each Topical Daily   enalapril  10 mg Oral Daily   feeding supplement  237 mL Oral BID BM   insulin aspart  0-15 Units Subcutaneous TID WC   insulin aspart  0-5 Units Subcutaneous QHS   insulin glargine-yfgn  24 Units Subcutaneous QHS   levothyroxine  137 mcg Oral QAC breakfast   melatonin  3 mg Oral QHS   multivitamin with minerals  1 tablet Oral Daily   nystatin   Topical  TID   pantoprazole  40 mg Oral Daily   rosuvastatin  20 mg Oral QHS   senna-docusate  1 tablet Oral BID   sodium chloride flush  10-40 mL Intracatheter Q12H     Data Reviewed:   CBG:  Recent Labs  Lab 12/13/22 0824 12/13/22 1117 12/13/22 1608 12/13/22 2106 12/14/22 0741  GLUCAP 190* 280* 330* 346* 246*    SpO2: 94 % O2 Flow Rate (L/min): 2 L/min    Vitals:   12/13/22  2000 12/13/22 2304 12/14/22 0218 12/14/22 0723  BP: (!) 127/58 (!) 151/68 (!) 156/54 139/70  Pulse: 60 66 (!) 59 (!) 56  Resp: (!) 25 20 20 17   Temp:  97.6 F (36.4 C) 98.1 F (36.7 C) 98 F (36.7 C)  TempSrc:  Oral Oral Oral  SpO2: 94% 95% 94% 94%  Weight:      Height:          Data Reviewed:  Basic Metabolic Panel: Recent Labs  Lab 12/08/22 0423 12/09/22 0614 12/10/22 0650 12/12/22 0904 12/13/22 0540  NA 138 136 137  --  135  K 4.0 3.9 4.1  --  4.0  CL 105 104 106  --  102  CO2 22 21* 22  --  22  GLUCOSE 145* 133* 185*  --  189*  BUN 16 15 15   --  22  CREATININE 0.83 1.04* 0.87  --  0.79  CALCIUM 8.8* 8.7* 8.7*  --  8.6*  MG 1.6* 1.8 1.6* 1.4* 2.0    CBC: Recent Labs  Lab 12/10/22 0650 12/13/22 0540  WBC 39.2* 40.6*  NEUTROABS 32.0*  --   HGB 11.2* 11.9*  HCT 33.9* 35.6*  MCV 88.1 87.3  PLT 300 211    LFT Recent Labs  Lab 12/13/22 0540  AST 33  ALT 17  ALKPHOS 190*  BILITOT 0.7  PROT 5.4*  ALBUMIN 2.5*     Antibiotics: Anti-infectives (From admission, onward)    None        DVT prophylaxis: SCDs  Code Status: DNR  Family Communication: Discussed with family members at bedside   CONSULTS    Subjective    Denies any complaints.  Objective    Physical Examination:  General-appears in no acute distress Heart-S1-S2, regular, no murmur auscultated Lungs-clear to auscultation bilaterally, no wheezing or crackles auscultated Abdomen-soft, nontender, no organomegaly Extremities-no edema in the lower extremities Neuro-alert, oriented x3, left hemiplegia Skin-faint erythematous rash on trunk and thighs   Status is: Inpatient:          [name]   [facility] If 7PM-7AM, please contact night-coverage at [email or web address], Office  [phone]   12/14/2022, 8:35 AM  LOS: 8 days

## 2022-12-14 NOTE — Progress Notes (Signed)
Inpatient Rehabilitation Admissions Coordinator   I have received second level denial from [employer] appeal for CIR admit .Third level appeal sent to [employer]. I met with patient and her son, Genevie Cheshire and spoke with son, [name], by phone. SNF now recommended. Acute team and TOC made aware. We will sign off.   Ottie Glazier, RN, MSN Rehab Admissions Coordinator [phone] 12/14/2022 10:40 AM

## 2022-12-14 NOTE — Inpatient Diabetes Management (Signed)
Inpatient Diabetes Program Recommendations  AACE/ADA: New Consensus Statement on Inpatient Glycemic Control (2015)  Target Ranges:  Prepandial:   less than 140 mg/dL      Peak postprandial:   less than 180 mg/dL (1-2 hours)      Critically ill patients:  140 - 180 mg/dL   Lab Results  Component Value Date   GLUCAP 246 (H) 12/14/2022   HGBA1C 8.8 (H) 10/22/2022    Latest Reference Range & Units 12/13/22 08:24 12/13/22 11:17 12/13/22 16:08 12/13/22 21:06 12/14/22 07:41  Glucose-Capillary 70 - 99 mg/dL 161 (H) 096 (H) 045 (H) 346 (H) 246 (H)  (H): Data is abnormally high  Diabetes history: DM  Outpatient Diabetes medications:  Metformin 500 mg bid Amaryl 4 mg bid Lantus 40 units q HS Current orders for Inpatient glycemic control:  Novolog 0-15 units tid with  meals and HS Semglee 24 units daily   Inpatient Diabetes Program Recommendations:   Please consider: -Increase Semglee to 26 units daily.  -Add Novolog meal coverage 4 units tid with meals (hold if patient eats less than 50% or NPO).   Thank you, Billy Fischer. Milagro Belmares, RN, MSN, CDCES  Diabetes Coordinator Inpatient Glycemic Control Team Team Pager [phone] (8am-5pm) 12/14/2022 10:43 AM

## 2022-12-14 NOTE — Progress Notes (Signed)
Occupational Therapy Treatment Patient Details Name: Erin Hoffman MRN: [id number] DOB: [birth year] Today's Date: 12/14/2022   History of present illness 77 yo female L sided weakness s/p fall MRI ICH and small lesion R parietal bone PMH afib on eliquis HTN DM cholangiocarcinoma with metastasis to R 4th rib, T9 and possibly left humeral head liver mass, anxiety,   OT comments  Patient's frequency updated to reflect rehab need and abilities.  Patient continues to have no AROM to LUE with impaired sensation and proprioception.  Continues to need +2 for stand pivot transfers with L knee blocked.  Patient working hard on compensatory use of R UE to maximize her functional independence.  OT to continue efforts in the acute setting, and Patient will benefit from intensive inpatient follow up therapy, >3 hours/day       If plan is discharge home, recommend the following:  Two people to help with walking and/or transfers;Two people to help with bathing/dressing/bathroom   Equipment Recommendations  BSC/3in1;Wheelchair (measurements OT);Wheelchair cushion (measurements OT);Hospital bed;Hoyer lift    Recommendations for Other Services      Precautions / Restrictions Precautions Precautions: Fall Precaution Comments: [place]on Foley in the mid to upper 50's, L shoulder sublux Restrictions Weight Bearing Restrictions: No       Mobility Bed Mobility               General bed mobility comments: up in recliner    Transfers Overall transfer level: Needs assistance Equipment used: 2 person hand held assist Transfers: Sit to/from Stand, Bed to chair/wheelchair/BSC Sit to Stand: +2 physical assistance, Mod assist, From elevated surface Stand pivot transfers: Mod assist, +2 physical assistance               Balance Overall balance assessment: Needs assistance Sitting-balance support: Single extremity supported, Feet supported Sitting balance-Leahy Scale: Poor     Standing balance  support: During functional activity, Bilateral upper extremity supported Standing balance-Leahy Scale: Poor                             ADL either performed or assessed with clinical judgement   ADL       Grooming: Wash/dry hands;Moderate assistance;Sitting   Upper Body Bathing: Moderate assistance   Lower Body Bathing: Total assistance           Toilet Transfer: +2 for physical assistance;Moderate assistance;Stand-pivot;BSC/3in1                  Extremity/Trunk Assessment Upper Extremity Assessment RUE Deficits / Details: WFL LUE Deficits / Details: No AROM noted, trace shoulder shrug LUE Sensation: decreased light touch;decreased proprioception LUE Coordination: decreased fine motor;decreased gross motor   Lower Extremity Assessment Lower Extremity Assessment: Defer to PT evaluation        Vision Baseline Vision/History: 1 Wears glasses Alignment/Gaze Preference: Gaze right   Perception Perception Perception: Impaired Preception Impairment Details: Inattention/Neglect   Praxis      Cognition Arousal: Alert Behavior During Therapy: WFL for tasks assessed/performed Overall Cognitive Status: Impaired/Different from baseline                             Awareness: Emergent   General Comments: L inattention, slowed processing        Exercises      Shoulder Instructions       General Comments  VSS    Pertinent Vitals/ Pain  Pain Assessment Pain Assessment: Faces Faces Pain Scale: Hurts a little bit Pain Location: L shoulder Pain Descriptors / Indicators: Sore, Spasm Pain Intervention(s): Monitored during session                                                          Frequency  Min 2X/week        Progress Toward Goals  OT Goals(current goals can now be found in the care plan section)  Progress towards OT goals: Progressing toward goals  Acute Rehab OT Goals Time For Goal  Achievement: 12/21/22 Potential to Achieve Goals: Good  Plan      Co-evaluation                 AM-PAC OT "6 Clicks" Daily Activity     Outcome Measure   Help from another person eating meals?: A Little Help from another person taking care of personal grooming?: A Little Help from another person toileting, which includes using toliet, bedpan, or urinal?: A Lot Help from another person bathing (including washing, rinsing, drying)?: A Lot Help from another person to put on and taking off regular upper body clothing?: A Lot Help from another person to put on and taking off regular lower body clothing?: Total 6 Click Score: 13    End of Session Equipment Utilized During Treatment: Gait belt  OT Visit Diagnosis: Unsteadiness on feet (R26.81);Muscle weakness (generalized) (M62.81) Hemiplegia - Right/Left: Left Hemiplegia - dominant/non-dominant: Non-Dominant Hemiplegia - caused by: Nontraumatic SAH   Activity Tolerance Patient tolerated treatment well   Patient Left in chair;with call bell/phone within reach;with nursing/sitter in room;with family/visitor present   Nurse Communication Mobility status        Time: 1308-6578 OT Time Calculation (min): 16 min  Charges: OT General Charges $OT Visit: 1 Visit OT Treatments $Self Care/Home Management : 8-22 mins  12/14/2022  RP, OTR/L  Acute Rehabilitation Services  Office:  [phone]   Suzanna Obey 12/14/2022, 12:57 PM

## 2022-12-14 NOTE — Telephone Encounter (Signed)
Referral placed to [facility] for post discharge needs.  Bevelyn Ngo, BSW, CDP [facility]  [facility] Manager Population Health Direct Dial: [phone]  Fax: [phone]

## 2022-12-15 DIAGNOSIS — Z515 Encounter for palliative care: Secondary | ICD-10-CM | POA: Diagnosis not present

## 2022-12-15 DIAGNOSIS — Z7189 Other specified counseling: Secondary | ICD-10-CM | POA: Diagnosis not present

## 2022-12-15 LAB — CBC
HCT: 34.9 % — ABNORMAL LOW (ref 36.0–46.0)
Hemoglobin: 11.5 g/dL — ABNORMAL LOW (ref 12.0–15.0)
MCH: 29 pg (ref 26.0–34.0)
MCHC: 33 g/dL (ref 30.0–36.0)
MCV: 88.1 fL (ref 80.0–100.0)
Platelets: 146 10*3/uL — ABNORMAL LOW (ref 150–400)
RBC: 3.96 MIL/uL (ref 3.87–5.11)
RDW: 17.2 % — ABNORMAL HIGH (ref 11.5–15.5)
WBC: 24.7 10*3/uL — ABNORMAL HIGH (ref 4.0–10.5)
nRBC: 0 % (ref 0.0–0.2)

## 2022-12-15 LAB — GLUCOSE, CAPILLARY
Glucose-Capillary: 235 mg/dL — ABNORMAL HIGH (ref 70–99)
Glucose-Capillary: 319 mg/dL — ABNORMAL HIGH (ref 70–99)
Glucose-Capillary: 307 mg/dL — ABNORMAL HIGH (ref 70–99)
Glucose-Capillary: 291 mg/dL — ABNORMAL HIGH (ref 70–99)

## 2022-12-15 MED ORDER — INSULIN ASPART 100 UNIT/ML IJ SOLN
3.0000 [IU] | Freq: Three times a day (TID) | INTRAMUSCULAR | Status: DC
  Administered 2022-12-15 – 2022-12-16 (×3): 3 [IU] via SUBCUTANEOUS

## 2022-12-15 NOTE — Progress Notes (Signed)
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  She was found to have intracranial hemorrhage.  Given Andexxa in ED and started on Cleviprex for BP control.  She was transferred to [facility] ICU and admitted under neurology service.  PT recommended CIR.  Care transferred to [facility] service from 12/08/2022 onwards.      Assessment/Plan:   Right thalamus ICH with left-sided weakness -Most likely hypertensive bleed in the setting of Eliquis use.   Status post Eliquis reversal with Andexxa -Initially started on Cleviprex for BP control. She was transferred to [facility] ICU and admitted under neurology service.  -CTA head and neck showed no LVO but intra increase of right thalamic ICH.   CT C-spine showed no fracture.  MRI of brain showed stable ICH comparison with repeat CTA -Repeat CT head on 12/07/2022 showed stable ICH -2D echo showed EF of 60 to 65% with no shunt; LDL 123; A1c 8.8 -Was on Eliquis prior to admission: Now not on any antithrombotics due to ICH -Care transferred to [facility] service from 12/08/2022 onwards.   Neurology signed off on 12/10/2022. -PT recommending CIR.   -CIR declined by insurance company -Plan to go to skilled nursing facility for rehab   Cholangiocarcinoma with mets -On immunotherapy as an outpatient.   MRI brain showed small lesion on right parietal bone and recommended follow-up brain MRI in 3 months -Palliative care following. -Outpatient follow-up with oncology/Dr. Ellin Saba   Hypomagnesemia -Replete   Paroxysmal A-fib -Currently mildly bradycardic.  Eliquis on hold due to  intracerebral bleed as above     Hypertension -Off Cleviprex drip.  Blood pressure stable.  Continue amlodipine, enalapril   Hypothyroidism -Continue levothyroxine   Diabetes mellitus type 2 uncontrolled with hyperglycemia -A1c 8.8.  Blood sugars intermittently getting elevated.  Continue CBGs with SSI.  -On metformin and Lantus at home.   -Changed sliding scale to moderate, start NovoLog 3 units 3 times daily meal coverage -Increased Semglee to 30 units subcu daily    Hyperlipidemia -LDL 123.  On Crestor 10 mg daily at home.   Started on Crestor 20 mg daily.   Dysphagia -Started on dysphagia 2 diet  Leukocytosis -WBC elevated to 40,000 -Called and discussed with Dr. Ellin Saba, patient's oncologist at [facility] -He says patient received G-CSF on 11/27, which has caused leukocytosis -No signs of infection, afebrile -Will continue to monitor  Rash with itching -Noted on lower back and thighs -Started on nystatin powder, hydroxyzine -Rash is slowly improving -Will continue to monitor  Medications     acetaminophen  500 mg Oral Q6H   amLODipine  10 mg Oral Daily   Chlorhexidine Gluconate Cloth  6 each Topical Daily   enalapril  10 mg Oral Daily   feeding supplement  237 mL Oral BID BM   insulin aspart  0-15 Units Subcutaneous TID WC   insulin aspart  0-5 Units Subcutaneous QHS   insulin aspart  3 Units Subcutaneous TID WC   insulin glargine-yfgn  30 Units Subcutaneous QHS   levothyroxine  137 mcg Oral QAC breakfast   melatonin  3 mg Oral QHS   multivitamin with minerals  1 tablet Oral Daily  nystatin   Topical TID   pantoprazole  40 mg Oral Daily   rosuvastatin  20 mg Oral QHS   senna-docusate  1 tablet Oral BID   sodium chloride flush  10-40 mL Intracatheter Q12H     Data Reviewed:   CBG:  Recent Labs  Lab 12/14/22 1112 12/14/22 1302 12/14/22 1635 12/14/22 2117 12/15/22 0724  GLUCAP 260* 247* 195* 324* 235*    SpO2: 99 % O2 Flow Rate (L/min): 2  L/min    Vitals:   12/14/22 1940 12/14/22 2300 12/15/22 0305 12/15/22 0725  BP: (!) 138/53 (!) 136/58 (!) 122/56   Pulse: (!) 56 (!) 58 60 (!) 58  Resp: 20 20 20    Temp: 98 F (36.7 C) 98.2 F (36.8 C) 97.7 F (36.5 C) 98.2 F (36.8 C)  TempSrc: Oral Oral Oral Oral  SpO2: 97% 98% 98% 99%  Weight:      Height:          Data Reviewed:  Basic Metabolic Panel: Recent Labs  Lab 12/09/22 0614 12/10/22 0650 12/12/22 0904 12/13/22 0540  NA 136 137  --  135  K 3.9 4.1  --  4.0  CL 104 106  --  102  CO2 21* 22  --  22  GLUCOSE 133* 185*  --  189*  BUN 15 15  --  22  CREATININE 1.04* 0.87  --  0.79  CALCIUM 8.7* 8.7*  --  8.6*  MG 1.8 1.6* 1.4* 2.0    CBC: Recent Labs  Lab 12/10/22 0650 12/13/22 0540 12/15/22 0948  WBC 39.2* 40.6* 24.7*  NEUTROABS 32.0*  --   --   HGB 11.2* 11.9* 11.5*  HCT 33.9* 35.6* 34.9*  MCV 88.1 87.3 88.1  PLT 300 211 146*    LFT Recent Labs  Lab 12/13/22 0540  AST 33  ALT 17  ALKPHOS 190*  BILITOT 0.7  PROT 5.4*  ALBUMIN 2.5*     Antibiotics: Anti-infectives (From admission, onward)    None        DVT prophylaxis: SCDs  Code Status: DNR  Family Communication: Discussed with family members at bedside   CONSULTS    Subjective   Denies any complaints.  Agreeable to go to skilled nursing facility for rehab.  Objective    Physical Examination:  General-appears in no acute distress Heart-S1-S2, regular, no murmur auscultated Lungs-clear to auscultation bilaterally, no wheezing or crackles auscultated Abdomen-soft, nontender, no organomegaly Extremities-no edema in the lower extremities Neuro-alert, oriented x3, left hemiplegia   Status is: Inpatient:          [name]   [facility] If 7PM-7AM, please contact night-coverage at [email or web address], Office  [phone]   12/15/2022, 11:22 AM  LOS: 9 days

## 2022-12-16 DIAGNOSIS — Z7189 Other specified counseling: Secondary | ICD-10-CM | POA: Diagnosis not present

## 2022-12-16 DIAGNOSIS — Z515 Encounter for palliative care: Secondary | ICD-10-CM | POA: Diagnosis not present

## 2022-12-16 LAB — GLUCOSE, CAPILLARY
Glucose-Capillary: 256 mg/dL — ABNORMAL HIGH (ref 70–99)
Glucose-Capillary: 286 mg/dL — ABNORMAL HIGH (ref 70–99)
Glucose-Capillary: 192 mg/dL — ABNORMAL HIGH (ref 70–99)
Glucose-Capillary: 166 mg/dL — ABNORMAL HIGH (ref 70–99)

## 2022-12-16 MED ORDER — INSULIN ASPART 100 UNIT/ML IJ SOLN
5.0000 [IU] | Freq: Three times a day (TID) | INTRAMUSCULAR | Status: DC
Start: 2022-12-16 — End: 2022-12-17
  Administered 2022-12-16 (×2): 5 [IU] via SUBCUTANEOUS

## 2022-12-16 MED ORDER — ORAL CARE MOUTH RINSE: 15.0000 mL | OROMUCOSAL | Status: DC | PRN

## 2022-12-16 MED ORDER — ORAL CARE MOUTH RINSE
15.0000 mL | OROMUCOSAL | Status: DC
  Administered 2022-12-16 – 2022-12-19 (×13): 15 mL via OROMUCOSAL

## 2022-12-16 NOTE — Progress Notes (Signed)
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  She was found to have intracranial hemorrhage.  Given Andexxa in ED and started on Cleviprex for BP control.  She was transferred to [facility] ICU and admitted under neurology service.  PT recommended CIR.  Care transferred to [facility] service from 12/08/2022 onwards.      Assessment/Plan:   Right thalamus ICH with left-sided weakness -Most likely hypertensive bleed in the setting of Eliquis use.   Status post Eliquis reversal with Andexxa -Initially started on Cleviprex for BP control. She was transferred to [facility] ICU and admitted under neurology service.  -CTA head and neck showed no LVO but intra increase of right thalamic ICH.   CT C-spine showed no fracture.  MRI of brain showed stable ICH comparison with repeat CTA -Repeat CT head on 12/07/2022 showed stable ICH -2D echo showed EF of 60 to 65% with no shunt; LDL 123; A1c 8.8 -Was on Eliquis prior to admission: Now not on any antithrombotics due to ICH -Care transferred to [facility] service from 12/08/2022 onwards.   Neurology signed off on 12/10/2022. -PT recommending CIR.   -CIR declined by insurance company -Plan to go to skilled nursing facility for rehab   Cholangiocarcinoma with mets -On immunotherapy as an outpatient.   MRI brain showed small lesion on right parietal bone and recommended follow-up brain MRI in 3 months -Palliative care following. -Outpatient follow-up with oncology/Erin Hoffman   Hypomagnesemia -Replete   Paroxysmal A-fib -Currently mildly bradycardic.  Eliquis on hold due to  intracerebral bleed as above     Hypertension -Off Cleviprex drip.  Blood pressure stable.  Continue amlodipine, enalapril   Hypothyroidism -Continue levothyroxine   Diabetes mellitus type 2 uncontrolled with hyperglycemia -A1c 8.8.  Blood sugars intermittently getting elevated.  Continue CBGs with SSI.  -On metformin and Lantus at home.   -Changed sliding scale to moderate, started NovoLog 3 units 3 times daily meal coverage -CBG still elevated, will increase meal coverage to 5 units 3 times daily, if eating more than 50% meals -Increased Semglee to 30 units subcu daily    Hyperlipidemia -LDL 123.  On Crestor 10 mg daily at home.   Started on Crestor 20 mg daily.   Dysphagia -Started on dysphagia 2 diet  Leukocytosis -WBC was elevated to 40,000, improved to 24,000 -Called and discussed with Erin Hoffman, patient's oncologist at [facility] -He says patient received G-CSF on 11/27, which has caused leukocytosis -No signs of infection, afebrile -Will continue to monitor  Rash with itching -Noted on lower back and thighs -Started on nystatin powder, hydroxyzine -Rash is slowly improving -Will continue to monitor  Medications     acetaminophen  500 mg Oral Q6H   amLODipine  10 mg Oral Daily   Chlorhexidine Gluconate Cloth  6 each Topical Daily   enalapril  10 mg Oral Daily   feeding supplement  237 mL Oral BID BM   insulin aspart  0-15 Units Subcutaneous TID WC   insulin aspart  0-5 Units Subcutaneous QHS   insulin aspart  5 Units Subcutaneous TID WC   insulin glargine-yfgn  30 Units Subcutaneous QHS   levothyroxine  137 mcg Oral QAC breakfast   melatonin  3 mg Oral QHS   multivitamin with minerals  1 tablet Oral Daily   nystatin   Topical TID   mouth rinse  15 mL Mouth Rinse 4 times per day   pantoprazole  40 mg Oral Daily   rosuvastatin  20 mg Oral QHS   senna-docusate  1 tablet Oral BID   sodium chloride flush  10-40 mL Intracatheter Q12H     Data  Reviewed:   CBG:  Recent Labs  Lab 12/15/22 1124 12/15/22 1510 12/15/22 2144 12/16/22 0730 12/16/22 1128  GLUCAP 319* 307* 291* 256* 286*    SpO2: 97 % O2 Flow Rate (L/min): 2 L/min    Vitals:   12/16/22 0732 12/16/22 0801 12/16/22 1131 12/16/22 1523  BP:  (!) 150/64    Pulse:      Resp:  16    Temp: 98 F (36.7 C)  97.6 F (36.4 C) 98.9 F (37.2 C)  TempSrc: Oral  Oral Oral  SpO2:    97%  Weight:      Height:          Data Reviewed:  Basic Metabolic Panel: Recent Labs  Lab 12/10/22 0650 12/12/22 0904 12/13/22 0540  NA 137  --  135  K 4.1  --  4.0  CL 106  --  102  CO2 22  --  22  GLUCOSE 185*  --  189*  BUN 15  --  22  CREATININE 0.87  --  0.79  CALCIUM 8.7*  --  8.6*  MG 1.6* 1.4* 2.0    CBC: Recent Labs  Lab 12/10/22 0650 12/13/22 0540 12/15/22 0948  WBC 39.2* 40.6* 24.7*  NEUTROABS 32.0*  --   --   HGB 11.2* 11.9* 11.5*  HCT 33.9* 35.6* 34.9*  MCV 88.1 87.3 88.1  PLT 300 211 146*    LFT Recent Labs  Lab 12/13/22 0540  AST 33  ALT 17  ALKPHOS 190*  BILITOT 0.7  PROT 5.4*  ALBUMIN 2.5*     Antibiotics: Anti-infectives (From admission, onward)    None        DVT prophylaxis: SCDs  Code Status: DNR  Family Communication: Discussed with family members at bedside   CONSULTS    Subjective   Denies any complaints  Objective    Physical Examination:  General-appears in no acute distress Heart-S1-S2, regular, no murmur auscultated Lungs-clear to auscultation bilaterally, no wheezing or crackles auscultated Abdomen-soft, nontender, no organomegaly Extremities-no edema in the lower extremities Neuro-alert, oriented x3, left hemiplegia  Status is: Inpatient:          Erin Hoffman   [facility] If 7PM-7AM, please contact night-coverage at [email or web address], Office  [phone]   12/16/2022, 3:58 PM  LOS: 10 days

## 2022-12-17 DIAGNOSIS — Z515 Encounter for palliative care: Secondary | ICD-10-CM | POA: Diagnosis not present

## 2022-12-17 DIAGNOSIS — Z7189 Other specified counseling: Secondary | ICD-10-CM | POA: Diagnosis not present

## 2022-12-17 LAB — GLUCOSE, CAPILLARY
Glucose-Capillary: 82 mg/dL (ref 70–99)
Glucose-Capillary: 163 mg/dL — ABNORMAL HIGH (ref 70–99)
Glucose-Capillary: 202 mg/dL — ABNORMAL HIGH (ref 70–99)
Glucose-Capillary: 269 mg/dL — ABNORMAL HIGH (ref 70–99)
Glucose-Capillary: 144 mg/dL — ABNORMAL HIGH (ref 70–99)

## 2022-12-17 LAB — CBC
HCT: 35.2 % — ABNORMAL LOW (ref 36.0–46.0)
Hemoglobin: 12 g/dL (ref 12.0–15.0)
MCH: 29.7 pg (ref 26.0–34.0)
MCHC: 34.1 g/dL (ref 30.0–36.0)
MCV: 87.1 fL (ref 80.0–100.0)
Platelets: 227 10*3/uL (ref 150–400)
RBC: 4.04 MIL/uL (ref 3.87–5.11)
RDW: 17.3 % — ABNORMAL HIGH (ref 11.5–15.5)
WBC: 25.9 10*3/uL — ABNORMAL HIGH (ref 4.0–10.5)
nRBC: 0 % (ref 0.0–0.2)

## 2022-12-17 MED ORDER — INSULIN ASPART 100 UNIT/ML IJ SOLN
3.0000 [IU] | Freq: Three times a day (TID) | INTRAMUSCULAR | Status: DC
  Administered 2022-12-18 – 2022-12-19 (×6): 3 [IU] via SUBCUTANEOUS

## 2022-12-17 MED ORDER — GLUCERNA SHAKE PO LIQD
237.0000 mL | Freq: Two times a day (BID) | ORAL | Status: DC
Start: 2022-12-18 — End: 2022-12-20
  Administered 2022-12-18 – 2022-12-19 (×4): 237 mL via ORAL

## 2022-12-17 NOTE — TOC Progression Note (Signed)
Transition of Care [facility]) - Progression Note    Patient Details  Name: Erin Hoffman MRN: [id number] Date of Birth: 07/10/1945  [facility]) CM/SW Contact  Eduard Roux, [place] Phone Number: 12/17/2022, 11:57 AM  Clinical Narrative:     CSW met with patient- informed of bed offers. Patient was not too happy w/her choices. CSW will allow patient time to consider her choices and will return later today for SNF choice.  Antony Blackbird, MSW, LCSW Clinical Social Worker    Expected Discharge Plan: IP Rehab Facility Barriers to Discharge: Continued Medical Work up  Expected Discharge Plan and Services                                               Social Determinants of Health (SDOH) Interventions SDOH Screenings   Food Insecurity: No Food Insecurity (12/06/2022)  Housing: Low Risk  (12/06/2022)  Transportation Needs: No Transportation Needs (12/06/2022)  Utilities: Not At Risk (12/06/2022)  Tobacco Use: Medium Risk (12/05/2022)    Readmission Risk Interventions     No data to display

## 2022-12-17 NOTE — Progress Notes (Signed)
   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 Presented with left weakness and found to have a right thalamic hemorrhage.   Today's Discussion 12/17/2022  *Please note that this is a verbal dictation therefore any spelling or grammatical errors are due to the "Dragon Medical One" system interpretation.  Chart reviewed inclusive of vital signs, progress notes, laboratory results, and diagnostic images.   I met with Escarleth this afternoon. She was sitting up in the recliner in the presence of her aunt. She shares that she is feeling well today other than having generalized pain. She does note that it is remedied well with oxycodone.   We discussed the plan moving forward, Maylan remains hopeful for rehabilitation. She shares awareness that her insurance is inhibiting further placement options. We reviewed that this is frustrating for her.   Discussed with Arnika the importance of her family who give her the purpose in life to carry on.   Provided time and space to discuss patients clinical scenario. Allowed her the opportunity to express feeling related to her new baseline function. She maintain hope of ongoing improvements.   Questions and concerns addressed/Palliative Support Provided.   Objective Assessment: Vital Signs Vitals:   12/17/22 0802 12/17/22 1132  BP: (!) 144/63 (!) 148/81  Pulse: 61 60  Resp: 16 15  Temp: 98 F (36.7 C) 97.9 F (36.6 C)  SpO2: 95% 97%    Intake/Output Summary (Last 24 hours) at 12/17/2022 1338 Last data filed at 12/17/2022 0815 Gross per 24 hour  Intake --  Output 600 ml  Net -600 ml   Last Weight  Most recent update: 12/06/2022 12:21 AM    Weight  84.1 kg (185 lb  6.5 oz)            Gen: Elderly Caucasian female in no acute distress HEENT: Dry mucous membranes CV: Regular rate and rhythm PULM: On room air breathing is even and nonlabored ABD: soft/nontender EXT: Left-sided hemiplegia Neuro: Alert and oriented x3  SUMMARY OF RECOMMENDATIONS   DNAR/DNI  Continue to allow time for outcomes  Appreciate PT/OT/Speech involvement  Appreciate TOC involvement for skilled nursing placement  The Palliative Care Team will continue to offer incremental support   Billing based on MDM: Moderate  _____________________________________________________________________________________ Lamarr Lulas [place] Palliative Medicine Team Team Cell Phone: [phone] Please utilize secure chat with additional questions, if there is no response within 30 minutes please call the above phone number  Palliative Medicine Team providers are available by phone from 7am to 7pm daily and can be reached through the team cell phone.  Should this patient require assistance outside of these hours, please call the patient's attending physician.

## 2022-12-17 NOTE — Progress Notes (Addendum)
[facility]  PROGRESS NOTE  Erin Hoffman XBM:[id number] DOB: 07/01/1945 DOA: 12/05/2022 PCP: [name], MD   Brief HPI:   77 year old female with history of A-fib on Eliquis, hypertension, hyperlipidemia, diabetes mellitus type 2, prior smoker, cholangiocarcinoma with metastasis to right fourth rib, T9 and possibly left humeral head presented to [facility] ED due to sudden left-sided weakness and fall.  She was found to have intracranial hemorrhage.  Given Andexxa in ED and started on Cleviprex for BP control.  She was transferred to [facility] ICU and admitted under neurology service.  PT recommended CIR.  Care transferred to [facility] service from 12/08/2022 onwards.      Assessment/Plan:   Right thalamus ICH with left-sided weakness -Most likely hypertensive bleed in the setting of Eliquis use.   Status post Eliquis reversal with Andexxa -Initially started on Cleviprex for BP control. She was transferred to [facility] ICU and admitted under neurology service.  -CTA head and neck showed no LVO but intra increase of right thalamic ICH.   CT C-spine showed no fracture.  MRI of brain showed stable ICH comparison with repeat CTA -Repeat CT head on 12/07/2022 showed stable ICH -2D echo showed EF of 60 to 65% with no shunt; LDL 123; A1c 8.8 -Was on Eliquis prior to admission: Now not on any antithrombotics due to ICH -Care transferred to [facility] service from 12/08/2022 onwards.   Neurology signed off on 12/10/2022. -PT recommending CIR.   -CIR declined by insurance company -Plan to go to skilled nursing facility for rehab   Cholangiocarcinoma with mets -On immunotherapy as an outpatient.   MRI brain showed small lesion on right parietal bone and recommended follow-up brain MRI in 3 months -Palliative care following. -Outpatient follow-up with oncology/Dr. Ellin Saba   Hypomagnesemia -Replete   Paroxysmal A-fib -Currently mildly bradycardic.  Eliquis on hold due to  intracerebral bleed as above     Hypertension -Off Cleviprex drip.  Blood pressure stable.  Continue amlodipine, enalapril   Hypothyroidism -Continue levothyroxine   Diabetes mellitus type 2 uncontrolled with hyperglycemia -A1c 8.8.  Blood sugars intermittently getting elevated.  Continue CBGs with SSI.  -On metformin and Lantus at home.   -Changed sliding scale to moderate, started on meal coverage 5 units 3 times daily, CBG was low this morning -Will keep the on meal coverage to 4 units 3 times daily if eating more than 50% meals -Continue Semglee 30 units subcu daily     Hyperlipidemia -LDL 123.  On Crestor 10 mg daily at home.   Started on Crestor 20 mg daily.   Dysphagia -Started on dysphagia 2 diet  Leukocytosis -WBC was elevated to 40,000, improved to 24,000 -Called and discussed with Dr. Ellin Saba, patient's oncologist at [facility] -He says patient received G-CSF on 11/27, which has caused leukocytosis -No signs of infection, afebrile -Will continue to monitor  Rash with itching -Noted on lower back and thighs -Started on nystatin powder, hydroxyzine -Rash is slowly improving -Will continue to monitor  Medications     acetaminophen  500 mg Oral Q6H   amLODipine  10 mg Oral Daily   Chlorhexidine Gluconate Cloth  6 each Topical Daily   enalapril  10 mg Oral Daily   feeding supplement  237 mL Oral BID BM   insulin aspart  0-15 Units Subcutaneous TID WC   insulin aspart  0-5 Units Subcutaneous QHS   insulin aspart  3 Units Subcutaneous TID WC   insulin glargine-yfgn  30 Units Subcutaneous QHS  levothyroxine  137 mcg Oral QAC breakfast   melatonin  3 mg Oral QHS   multivitamin with minerals  1 tablet Oral Daily   nystatin   Topical TID   mouth rinse  15 mL Mouth Rinse 4 times per day   pantoprazole  40 mg Oral Daily   rosuvastatin  20 mg Oral QHS   senna-docusate  1 tablet Oral BID   sodium chloride flush  10-40 mL Intracatheter Q12H     Data  Reviewed:   CBG:  Recent Labs  Lab 12/16/22 1128 12/16/22 1815 12/16/22 2058 12/17/22 0759 12/17/22 1142  GLUCAP 286* 192* 166* 82 163*    SpO2: 97 % O2 Flow Rate (L/min): 2 L/min    Vitals:   12/16/22 2340 12/17/22 0420 12/17/22 0802 12/17/22 1132  BP: (!) 131/54 (!) 127/53 (!) 144/63 (!) 148/81  Pulse: 63 (!) 57 61 60  Resp: 17 15 16 15   Temp: 97.9 F (36.6 C) 97.7 F (36.5 C) 98 F (36.7 C) 97.9 F (36.6 C)  TempSrc: Oral Oral Oral Oral  SpO2: 96% 95% 95% 97%  Weight:      Height:          Data Reviewed:  Basic Metabolic Panel: Recent Labs  Lab 12/12/22 0904 12/13/22 0540  NA  --  135  K  --  4.0  CL  --  102  CO2  --  22  GLUCOSE  --  189*  BUN  --  22  CREATININE  --  0.79  CALCIUM  --  8.6*  MG 1.4* 2.0    CBC: Recent Labs  Lab 12/13/22 0540 12/15/22 0948 12/17/22 0430  WBC 40.6* 24.7* 25.9*  HGB 11.9* 11.5* 12.0  HCT 35.6* 34.9* 35.2*  MCV 87.3 88.1 87.1  PLT 211 146* 227    LFT Recent Labs  Lab 12/13/22 0540  AST 33  ALT 17  ALKPHOS 190*  BILITOT 0.7  PROT 5.4*  ALBUMIN 2.5*     Antibiotics: Anti-infectives (From admission, onward)    None        DVT prophylaxis: SCDs  Code Status: DNR  Family Communication: Discussed with family members at bedside   CONSULTS    Subjective   Patient seen, denies any complaints.  Objective    Physical Examination:  General-appears in no acute distress Heart-S1-S2, regular, no murmur auscultated Lungs-clear to auscultation bilaterally, no wheezing or crackles auscultated Abdomen-soft, nontender, no organomegaly Extremities-no edema in the lower extremities Neuro-alert, oriented x3, hemiplegia on left  Status is: Inpatient:          [name]   [facility] If 7PM-7AM, please contact night-coverage at [email or web address], Office  [phone]   12/17/2022, 1:39 PM  LOS: 11 days

## 2022-12-17 NOTE — Progress Notes (Signed)
Physical Therapy Treatment Patient Details Name: Erin Hoffman MRN: [id number] DOB: 1945/01/29 Today's Date: 12/17/2022   History of Present Illness 77 yo female L sided weakness s/p fall MRI ICH and small lesion R parietal bone PMH afib on eliquis HTN DM cholangiocarcinoma with metastasis to R 4th rib, T9 and possibly left humeral head liver mass, anxiety,    PT Comments  Received pt getting cleaned up with NT/RN - PT took over with care. Pt agreeable to PT treatment despite discomfort in low back. Pt required max A for bed mobility and mod/max A for static sitting balance due to L lean/pushing - improved with cues to keep RUE in lap and to "lean to the right". Worked on static/dynamic sitting balance with emphasis on midline orientation and core activation. Stood in [place] with max A (noted worsening lean) and transferred to recliner in [place] dependently. Recommend intensive therapy >3hrs/day to maximize strength and independence prior to discharge. Acute PT to cont to follow.    If plan is discharge home, recommend the following: Two people to help with walking and/or transfers;Two people to help with bathing/dressing/bathroom;Assistance with cooking/housework;Assist for transportation;Help with stairs or ramp for entrance   Can travel by private vehicle        Equipment Recommendations  Other (comment) (TBD in next venue)    Recommendations for Other Services Rehab consult     Precautions / Restrictions Precautions Precautions: Fall Precaution Comments: [place] F[place] in the mid to upper 50's, L shoulder sublux Restrictions Weight Bearing Restrictions: No     Mobility  Bed Mobility Overal bed mobility: Needs Assistance Bed Mobility: Rolling, Supine to Sit Rolling: Max assist, Used rails   Supine to sit: Max assist, HOB elevated, Used rails     General bed mobility comments: pt able to use RLE/RUE but utilized helicopter method to transition to EOB due to L lean/pushing Patient  Response: Cooperative  Transfers Overall transfer level: Needs assistance   Transfers: Bed to chair/wheelchair/BSC Sit to Stand: Via lift equipment           General transfer comment: Stood from EOB in [place] with max A with max cues to correct L lean/pushing. When allowed time, pt able to slightly correct lean with mod A Transfer via Lift Equipment: [place]  Ambulation/Gait               General Gait Details: unable   Stairs             Wheelchair Mobility     Tilt Bed Tilt Bed Patient Response: Cooperative  Modified Rankin (Stroke Patients Only)       Balance Overall balance assessment: Needs assistance Sitting-balance support: Single extremity supported, Feet supported Sitting balance-Leahy Scale: Zero Sitting balance - Comments: pt with heavy L lateral lean/pushing - cues to keep RUE in lap and to lean towards R Postural control: Left lateral lean Standing balance support: During functional activity, Bilateral upper extremity supported Standing balance-Leahy Scale: Zero Standing balance comment: strong L lateral lean/pushing                            Cognition Arousal: Alert Behavior During Therapy: WFL for tasks assessed/performed Overall Cognitive Status: Impaired/Different from baseline Area of Impairment: Awareness                               General Comments: L inattention, L lean/pushing, slow processing  Exercises      General Comments        Pertinent Vitals/Pain Pain Assessment Pain Assessment: Faces Faces Pain Scale: Hurts a little bit Pain Location: L shoulder and low back Pain Descriptors / Indicators: Discomfort, Grimacing Pain Intervention(s): Limited activity within patient's tolerance, Monitored during session, Premedicated before session, Repositioned    Home Living                          Prior Function            PT Goals (current goals can now be found in the care  plan section) Acute Rehab PT Goals Patient Stated Goal: return to independent PT Goal Formulation: With patient Time For Goal Achievement: 12/21/22 Potential to Achieve Goals: Fair Progress towards PT goals: Progressing toward goals    Frequency    Min 3X/week      PT Plan      Co-evaluation              AM-PAC PT "6 Clicks" Mobility   Outcome Measure  Help needed turning from your back to your side while in a flat bed without using bedrails?: A Lot Help needed moving from lying on your back to sitting on the side of a flat bed without using bedrails?: Total Help needed moving to and from a bed to a chair (including a wheelchair)?: Total Help needed standing up from a chair using your arms (e.g., wheelchair or bedside chair)?: Total Help needed to walk in hospital room?: Total Help needed climbing 3-5 steps with a railing? : Total 6 Click Score: 7    End of Session Equipment Utilized During Treatment: Gait belt Activity Tolerance: Patient tolerated treatment well Patient left: in chair;with call bell/phone within reach;with chair alarm set Nurse Communication: Mobility status;Need for lift equipment PT Visit Diagnosis: Other abnormalities of gait and mobility (R26.89);Hemiplegia and hemiparesis;Other symptoms and signs involving the nervous system (R29.898) Hemiplegia - Right/Left: Left Hemiplegia - dominant/non-dominant: Non-dominant Hemiplegia - caused by: Nontraumatic intracerebral hemorrhage     Time: 1002-1025 PT Time Calculation (min) (ACUTE ONLY): 23 min  Charges:    $Therapeutic Activity: 8-22 mins $Neuromuscular Re-education: 8-22 mins PT General Charges $$ ACUTE PT VISIT: 1 Visit                     Blima Rich PT, DPT [place] [place] 12/17/2022, 10:36 AM

## 2022-12-17 NOTE — TOC Progression Note (Signed)
Transition of Care [facility]) - Progression Note    Patient Details  Name: MAYONA YOUNT MRN: [id number] Date of Birth: [birth year]  Transition of Care [facility]) CM/SW Contact  Eduard Roux, [place] Phone Number: 12/17/2022, 5:05 PM  Clinical Narrative:     3:00pm CSW met with patient- she decided on [name] for short term rehab.   5:00pm [name] confirmed bed offer- Contacted insurance -HTA- left voice message, will need to start auth for SNF and PTAR- waiting on response   Antony Blackbird, MSW, LCSW Clinical Social Worker    Expected Discharge Plan: IP Rehab Facility Barriers to Discharge: Continued Medical Work up  Expected Discharge Plan and Services                                               Social Determinants of Health (SDOH) Interventions SDOH Screenings   Food Insecurity: No Food Insecurity (12/06/2022)  Housing: Low Risk  (12/06/2022)  Transportation Needs: No Transportation Needs (12/06/2022)  Utilities: Not At Risk (12/06/2022)  Tobacco Use: Medium Risk (12/05/2022)    Readmission Risk Interventions     No data to display

## 2022-12-17 NOTE — Progress Notes (Signed)
Occupational Therapy Treatment Patient Details Name: Erin Hoffman MRN: [id number] DOB: 10-03-1945 Today's Date: 12/17/2022   History of present illness 77 yo female L sided weakness s/p fall MRI ICH and small lesion R parietal bone PMH afib on eliquis HTN DM cholangiocarcinoma with metastasis to R 4th rib, T9 and possibly left humeral head liver mass, anxiety,   OT comments  Patient received in supine and recently returned to bed. Patient agreeable to work with OT for self feeding in chair position. Patient able to feed self with set provided with occasional cues to scan to left to locate all items. Patient pocketing food on left side of mouth and required verbal cues and occasionally physical assistance to clear. Nursing notified of patient pocketing food. Patient will benefit from continued inpatient follow up therapy, <3 hours/day to address transfers, self feeding, and grooming. Acute OT to continue to follow.        If plan is discharge home, recommend the following:  Two people to help with walking and/or transfers;Two people to help with bathing/dressing/bathroom   Equipment Recommendations  BSC/3in1;Wheelchair (measurements OT);Wheelchair cushion (measurements OT);Hospital bed;Hoyer lift    Recommendations for Other Services      Precautions / Restrictions Precautions Precautions: Fall Precaution Comments: Huston Foley in the mid to upper 50's, L shoulder sublux Restrictions Weight Bearing Restrictions: No       Mobility Bed Mobility Overal bed mobility: Needs Assistance             General bed mobility comments: placed in chair position for self feeding    Transfers                         Balance                                           ADL either performed or assessed with clinical judgement   ADL Overall ADL's : Needs assistance/impaired Eating/Feeding: Minimal assistance;Bed level;Sitting Eating/Feeding Details (indicate cue  type and reason): seated in chair position in bed for self feeding. Patient requiring cues to locate items to left and pocketing on left side with verbal cues and physical assistance to clear Grooming: Wash/dry face;Supervision/safety                                      Extremity/Trunk Assessment              Vision   Eye Alignment: Within Functional Limits Ocular Range of Motion: Within Functional Limits Alignment/Gaze Preference: Gaze right Additional Comments: cues to scan to left while self feeding, unable to locate all items without verba cues   Perception     Praxis      Cognition Arousal: Alert Behavior During Therapy: [facility] for tasks assessed/performed Overall Cognitive Status: Impaired/Different from baseline Area of Impairment: Awareness                               General Comments: Left inattention        Exercises      Shoulder Instructions       General Comments Patient seen for self feeding in chair position with cues to attend to left and to clear mouth due to pocketing on  left    Pertinent Vitals/ Pain       Pain Assessment Pain Assessment: No/denies pain Pain Intervention(s): Monitored during session  Home Living                                          Prior Functioning/Environment              Frequency  Min 2X/week        Progress Toward Goals  OT Goals(current goals can now be found in the care plan section)  Progress towards OT goals: Progressing toward goals  Acute Rehab OT Goals Patient Stated Goal: get better OT Goal Formulation: With patient Time For Goal Achievement: 12/21/22 Potential to Achieve Goals: Good ADL Goals Pt Will Perform Eating: with set-up;sitting Pt Will Perform Grooming: sitting;with set-up Pt Will Perform Upper Body Bathing: with min assist;sitting Additional ADL Goal #1: pt will place L UE on abdomen prior to all mobility to protect L UE with min  cues using R UE  Plan      Co-evaluation                 AM-PAC OT "6 Clicks" Daily Activity     Outcome Measure   Help from another person eating meals?: A Little Help from another person taking care of personal grooming?: A Little Help from another person toileting, which includes using toliet, bedpan, or urinal?: A Lot Help from another person bathing (including washing, rinsing, drying)?: A Lot Help from another person to put on and taking off regular upper body clothing?: A Lot Help from another person to put on and taking off regular lower body clothing?: Total 6 Click Score: 13    End of Session    OT Visit Diagnosis: Unsteadiness on feet (R26.81);Muscle weakness (generalized) (M62.81) Hemiplegia - Right/Left: Left Hemiplegia - dominant/non-dominant: Non-Dominant Hemiplegia - caused by: Nontraumatic SAH   Activity Tolerance Patient tolerated treatment well   Patient Left in bed;with call bell/phone within reach;with bed alarm set   Nurse Communication Other (comment) (pocketing food)        Time: 1610-9604 OT Time Calculation (min): 32 min  Charges: OT General Charges $OT Visit: 1 Visit OT Treatments $Self Care/Home Management : 23-37 mins  Alfonse Flavors, OTA Acute Rehabilitation Services  Office [phone]   Dewain Penning 12/17/2022, 2:56 PM

## 2022-12-18 ENCOUNTER — Ambulatory Visit: Payer: HMO | Admitting: Cardiology

## 2022-12-18 DIAGNOSIS — I610 Nontraumatic intracerebral hemorrhage in hemisphere, subcortical: Secondary | ICD-10-CM | POA: Diagnosis not present

## 2022-12-18 DIAGNOSIS — Z7189 Other specified counseling: Secondary | ICD-10-CM | POA: Diagnosis not present

## 2022-12-18 DIAGNOSIS — I619 Nontraumatic intracerebral hemorrhage, unspecified: Secondary | ICD-10-CM | POA: Diagnosis not present

## 2022-12-18 DIAGNOSIS — Z515 Encounter for palliative care: Secondary | ICD-10-CM | POA: Diagnosis not present

## 2022-12-18 LAB — GLUCOSE, CAPILLARY
Glucose-Capillary: 111 mg/dL — ABNORMAL HIGH (ref 70–99)
Glucose-Capillary: 147 mg/dL — ABNORMAL HIGH (ref 70–99)
Glucose-Capillary: 95 mg/dL (ref 70–99)
Glucose-Capillary: 227 mg/dL — ABNORMAL HIGH (ref 70–99)

## 2022-12-18 LAB — CBC
HCT: 37.3 % (ref 36.0–46.0)
Hemoglobin: 12.8 g/dL (ref 12.0–15.0)
MCH: 29.4 pg (ref 26.0–34.0)
MCHC: 34.3 g/dL (ref 30.0–36.0)
MCV: 85.6 fL (ref 80.0–100.0)
Platelets: 297 10*3/uL (ref 150–400)
RBC: 4.36 MIL/uL (ref 3.87–5.11)
RDW: 17.7 % — ABNORMAL HIGH (ref 11.5–15.5)
WBC: 30.7 10*3/uL — ABNORMAL HIGH (ref 4.0–10.5)
nRBC: 0 % (ref 0.0–0.2)

## 2022-12-18 NOTE — Progress Notes (Signed)
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  She was found to have intracranial hemorrhage.  Given Andexxa in ED and started on Cleviprex for BP control.  She was transferred to [facility] ICU and admitted under neurology service.  PT recommended CIR.  Care transferred to [facility] service from 12/08/2022 onwards.      Assessment/Plan:   Right thalamus ICH with left-sided weakness -Most likely hypertensive bleed in the setting of Eliquis use.   Status post Eliquis reversal with Andexxa -Initially started on Cleviprex for BP control. She was transferred to [facility] ICU and admitted under neurology service.  -CTA head and neck showed no LVO but intra increase of right thalamic ICH.   CT C-spine showed no fracture.  MRI of brain showed stable ICH comparison with repeat CTA -Repeat CT head on 12/07/2022 showed stable ICH -2D echo showed EF of 60 to 65% with no shunt; LDL 123; A1c 8.8 -Was on Eliquis prior to admission: Now not on any antithrombotics due to ICH -Care transferred to [facility] service from 12/08/2022 onwards.   Neurology signed off on 12/10/2022. -PT recommending CIR.   -CIR declined by insurance company -Plan to go to skilled nursing facility for rehab   Cholangiocarcinoma with mets -On immunotherapy as an outpatient.   MRI brain showed small lesion on right parietal bone and recommended follow-up brain MRI in 3 months -Palliative care following. -Outpatient follow-up with oncology/Dr. Ellin Saba   Hypomagnesemia -Replete   Paroxysmal A-fib -Currently mildly bradycardic.  Eliquis on hold due to  intracerebral bleed as above     Hypertension -Off Cleviprex drip.  Blood pressure stable.  Continue amlodipine, enalapril   Hypothyroidism -Continue levothyroxine   Diabetes mellitus type 2 uncontrolled with hyperglycemia -A1c 8.8.  Blood sugars intermittently getting elevated.  Continue CBGs with SSI.  -On metformin and Lantus at home.   -Changed sliding scale to moderate, started on meal coverage 5 units 3 times daily, CBG was low this morning -Will keep the on meal coverage to 4 units 3 times daily if eating more than 50% meals -Continue Semglee 30 units subcu daily     Hyperlipidemia -LDL 123.  On Crestor 10 mg daily at home.   Started on Crestor 20 mg daily.   Dysphagia -Started on dysphagia 2 diet  Leukocytosis -WBC was elevated to 40,000, still elevated at 30,000 -Called and discussed with Dr. Ellin Saba, patient's oncologist at [facility] -He says patient received G-CSF on 11/27, which has caused leukocytosis -No signs of infection, afebrile -Will continue to monitor  Rash with itching -Noted on lower back and thighs -Started on nystatin powder, hydroxyzine -Rash is slowly improving -Will continue to monitor  Medications     acetaminophen  500 mg Oral Q6H   amLODipine  10 mg Oral Daily   Chlorhexidine Gluconate Cloth  6 each Topical Daily   enalapril  10 mg Oral Daily   feeding supplement (GLUCERNA SHAKE)  237 mL Oral BID BM   insulin aspart  0-15 Units Subcutaneous TID WC   insulin aspart  0-5 Units Subcutaneous QHS   insulin aspart  3 Units Subcutaneous TID WC   insulin glargine-yfgn  30  Units Subcutaneous QHS   levothyroxine  137 mcg Oral QAC breakfast   melatonin  3 mg Oral QHS   multivitamin with minerals  1 tablet Oral Daily   nystatin   Topical TID   mouth rinse  15 mL Mouth Rinse 4 times per day   pantoprazole  40 mg Oral Daily   rosuvastatin  20 mg Oral QHS   senna-docusate  1 tablet Oral BID   sodium chloride flush  10-40 mL Intracatheter  Q12H     Data Reviewed:   CBG:  Recent Labs  Lab 12/17/22 1142 12/17/22 1512 12/17/22 1747 12/17/22 2115 12/18/22 0823  GLUCAP 163* 202* 269* 144* 111*    SpO2: 96 % O2 Flow Rate (L/min): 2 L/min    Vitals:   12/17/22 1539 12/17/22 2316 12/18/22 0341 12/18/22 0825  BP: (!) 152/61 (!) 146/66 (!) 157/63 (!) 141/60  Pulse: 66 64  60  Resp: 20 17 (!) 21 (!) 21  Temp: 98.5 F (36.9 C) 98.8 F (37.1 C) 98.4 F (36.9 C) 98.4 F (36.9 C)  TempSrc: Oral Oral Oral Oral  SpO2: 95% 94%  96%  Weight:      Height:          Data Reviewed:  Basic Metabolic Panel: Recent Labs  Lab 12/12/22 0904 12/13/22 0540  NA  --  135  K  --  4.0  CL  --  102  CO2  --  22  GLUCOSE  --  189*  BUN  --  22  CREATININE  --  0.79  CALCIUM  --  8.6*  MG 1.4* 2.0    CBC: Recent Labs  Lab 12/13/22 0540 12/15/22 0948 12/17/22 0430 12/18/22 0504  WBC 40.6* 24.7* 25.9* 30.7*  HGB 11.9* 11.5* 12.0 12.8  HCT 35.6* 34.9* 35.2* 37.3  MCV 87.3 88.1 87.1 85.6  PLT 211 146* 227 297    LFT Recent Labs  Lab 12/13/22 0540  AST 33  ALT 17  ALKPHOS 190*  BILITOT 0.7  PROT 5.4*  ALBUMIN 2.5*     Antibiotics: Anti-infectives (From admission, onward)    None        DVT prophylaxis: SCDs  Code Status: DNR  Family Communication: Discussed with family members at bedside   CONSULTS    Subjective   Denies any complaints.  Blood glucose is better controlled  Objective    Physical Examination:  General-appears in no acute distress Heart-S1-S2, regular, no murmur auscultated Lungs-clear to auscultation bilaterally, no wheezing or crackles auscultated Abdomen-soft, nontender, no organomegaly Extremities-no edema in the lower extremities Neuro-alert, oriented x3, left hemiplegia  Status is: Inpatient:          [name]   [facility] If 7PM-7AM, please contact night-coverage at [email or web address], Office  [phone]   12/18/2022, 8:46 AM  LOS:  12 days

## 2022-12-18 NOTE — Progress Notes (Unsigned)
Clinical Summary Ms. Ackroyd is a 77 y.o.female seen today as a new patient  1.Afib    Past Medical History:  Diagnosis Date   Anemia    Anxiety    Diabetes mellitus    H/O hiatal hernia    Hypertension    Hypothyroidism      Allergies  Allergen Reactions   Tape Itching and Rash     No current facility-administered medications for this visit.   No current outpatient medications on file.   Facility-Administered Medications Ordered in Other Visits  Medication Dose Route Frequency Provider Last Rate Last Admin   acetaminophen (TYLENOL) tablet 500 mg  500 mg Oral Q6H Alekh, Kshitiz, MD   500 mg at 12/18/22 0507   ALPRAZolam (XANAX) tablet 0.5 mg  0.5 mg Oral QHS PRN Glade Lloyd, MD   0.5 mg at 12/17/22 2125   alum & mag hydroxide-simeth (MAALOX/MYLANTA) 200-200-20 MG/5ML suspension 15 mL  15 mL Oral Q4H PRN [name], MD   15 mL at 12/13/22 1157   amLODipine (NORVASC) tablet 10 mg  10 mg Oral Daily Marvel Plan, MD   10 mg at 12/18/22 0920   Chlorhexidine Gluconate Cloth 2 % PADS 6 each  6 each Topical Daily Bhagat, Srishti L, MD   6 each at 12/18/22 0920   enalapril (VASOTEC) tablet 10 mg  10 mg Oral Daily Marvel Plan, MD   10 mg at 12/18/22 0919   feeding supplement (GLUCERNA SHAKE) (GLUCERNA SHAKE) liquid 237 mL  237 mL Oral BID BM [name], MD   237 mL at 12/18/22 0920   Gerhardt's butt cream   Topical PRN [name], MD   Given at 12/17/22 1006   hydrOXYzine (ATARAX) tablet 10 mg  10 mg Oral TID PRN [name], MD   10 mg at 12/15/22 0939   insulin aspart (novoLOG) injection 0-15 Units  0-15 Units Subcutaneous TID WC [name], MD   8 Units at 12/17/22 1751   insulin aspart (novoLOG) injection 0-5 Units  0-5 Units Subcutaneous QHS Gevena Mart A, NP   3 Units at 12/15/22 2146   insulin aspart (novoLOG) injection 3 Units  3 Units Subcutaneous TID WC [name], MD   3 Units at 12/18/22 0935   insulin glargine-yfgn (SEMGLEE) injection 30  Units  30 Units Subcutaneous QHS [name], MD   30 Units at 12/17/22 2125   labetalol (NORMODYNE) injection 10-20 mg  10-20 mg Intravenous Q2H PRN Gevena Mart A, NP   20 mg at 12/08/22 0207   levothyroxine (SYNTHROID) tablet 137 mcg  137 mcg Oral QAC breakfast Gevena Mart A, NP   137 mcg at 12/18/22 8295   melatonin tablet 3 mg  3 mg Oral QHS Ernie Avena, NP   3 mg at 12/16/22 2212   multivitamin with minerals tablet 1 tablet  1 tablet Oral Daily Glade Lloyd, MD   1 tablet at 12/18/22 0919   nystatin (MYCOSTATIN/NYSTOP) topical powder   Topical TID [name], MD   Given at 12/18/22 0920   Oral care mouth rinse  15 mL Mouth Rinse 4 times per day [name], MD   15 mL at 12/18/22 6213   Oral care mouth rinse  15 mL Mouth Rinse PRN [name], MD       oxyCODONE (Oxy IR/ROXICODONE) immediate release tablet 5 mg  5 mg Oral Q6H PRN Glade Lloyd, MD  5 mg at 12/18/22 0309   pantoprazole (PROTONIX) EC tablet 40 mg  40 mg Oral Daily Marvel Plan, MD   40 mg at 12/18/22 0920   polyethylene glycol (MIRALAX / GLYCOLAX) packet 17 g  17 g Oral Daily PRN Glade Lloyd, MD   17 g at 12/13/22 9604   prochlorperazine (COMPAZINE) injection 10 mg  10 mg Intravenous Q6H PRN Hetty Blend C, NP       rosuvastatin (CRESTOR) tablet 20 mg  20 mg Oral QHS Alekh, Kshitiz, MD   20 mg at 12/17/22 2125   senna-docusate (Senokot-S) tablet 1 tablet  1 tablet Oral BID Bhagat, Srishti L, MD   1 tablet at 12/18/22 0919   sodium chloride flush (NS) 0.9 % injection 10-40 mL  10-40 mL Intracatheter Q12H Bhagat, Srishti L, MD   10 mL at 12/18/22 5409     Past Surgical History:  Procedure Laterality Date   ABDOMINAL HYSTERECTOMY     CATARACT EXTRACTION W/PHACO Left 06/16/2013   Procedure: CATARACT EXTRACTION PHACO AND INTRAOCULAR LENS PLACEMENT (IOC);  Surgeon: [name] T. Nile Riggs, MD;  Location: [facility] ORS;  Service: Ophthalmology;  Laterality: Left;  CDE:4.65   COLONOSCOPY N/A 10/01/2014   Procedure:  COLONOSCOPY;  Surgeon: Malissa Hippo, MD;  Location: [facility] ENDO SUITE;  Service: Endoscopy;  Laterality: N/A;  1125   IR IMAGING GUIDED PORT INSERTION  11/20/2022   TONSILLECTOMY     TUBAL LIGATION       Allergies  Allergen Reactions   Tape Itching and Rash      Family History  Problem Relation Age of Onset   Brain cancer Father    Crohn's disease Sister    Coronary artery disease Sister    Multiple myeloma Brother    Diabetes Brother    Hypertension Brother    Rheum arthritis Mother      Social History Ms. Hempstead reports that she quit smoking about 34 years ago. Her smoking use included cigarettes. She started smoking about 64 years ago. She has a 30 pack-year smoking history. She has never used smokeless tobacco. Ms. Harbach reports no history of alcohol use.   Review of Systems CONSTITUTIONAL: No weight loss, fever, chills, weakness or fatigue.  HEENT: Eyes: No visual loss, blurred vision, double vision or yellow sclerae.No hearing loss, sneezing, congestion, runny nose or sore throat.  SKIN: No rash or itching.  CARDIOVASCULAR:  RESPIRATORY: No shortness of breath, cough or sputum.  GASTROINTESTINAL: No anorexia, nausea, vomiting or diarrhea. No abdominal pain or blood.  GENITOURINARY: No burning on urination, no polyuria NEUROLOGICAL: No headache, dizziness, syncope, paralysis, ataxia, numbness or tingling in the extremities. No change in bowel or bladder control.  MUSCULOSKELETAL: No muscle, back pain, joint pain or stiffness.  LYMPHATICS: No enlarged nodes. No history of splenectomy.  PSYCHIATRIC: No history of depression or anxiety.  ENDOCRINOLOGIC: No reports of sweating, cold or heat intolerance. No polyuria or polydipsia.  Marland Kitchen   Physical Examination There were no vitals filed for this visit. There were no vitals filed for this visit.  Gen: resting comfortably, no acute distress HEENT: no scleral icterus, pupils equal round and reactive, no palptable  cervical adenopathy,  CV Resp: Clear to auscultation bilaterally GI: abdomen is soft, non-tender, non-distended, normal bowel sounds, no hepatosplenomegaly MSK: extremities are warm, no edema.  Skin: warm, no rash Neuro:  no focal deficits Psych: appropriate affect   Diagnostic Studies     Assessment and Plan  Antoine Poche, M.D., F.A.C.C.

## 2022-12-18 NOTE — TOC Progression Note (Signed)
Transition of Care [facility]) - Progression Note    Patient Details  Name: Erin Hoffman MRN: [id number] Date of Birth: 12/18/1945  Transition of Care [facility]) CM/SW Contact  Eduard Roux, [place] Phone Number: 12/18/2022, 10:59 AM  Clinical Narrative:     Authorization started w/ [employer]- pending   TOC will continue to follow and assist with discharge planning.  Antony Blackbird, MSW, LCSW Clinical Social Worker    Expected Discharge Plan: IP Rehab Facility Barriers to Discharge: Continued Medical Work up  Expected Discharge Plan and Services                                               Social Determinants of Health (SDOH) Interventions SDOH Screenings   Food Insecurity: No Food Insecurity (12/06/2022)  Housing: Low Risk  (12/06/2022)  Transportation Needs: No Transportation Needs (12/06/2022)  Utilities: Not At Risk (12/06/2022)  Tobacco Use: Medium Risk (12/05/2022)    Readmission Risk Interventions     No data to display

## 2022-12-18 NOTE — TOC Progression Note (Signed)
Transition of Care [facility]) - Progression Note    Patient Details  Name: Erin Hoffman MRN: [id number] Date of Birth: 1945/05/01  Transition of Care [facility]) CM/SW Contact  Eduard Roux, [place] Phone Number: 12/18/2022, 8:50 AM  Clinical Narrative:     Called [employer]- left voice message to return call.   Expected Discharge Plan: SNF Barriers to Discharge: Continued Medical Work up  Expected Discharge Plan and Services                                               Social Determinants of Health (SDOH) Interventions SDOH Screenings   Food Insecurity: No Food Insecurity (12/06/2022)  Housing: Low Risk  (12/06/2022)  Transportation Needs: No Transportation Needs (12/06/2022)  Utilities: Not At Risk (12/06/2022)  Tobacco Use: Medium Risk (12/05/2022)    Readmission Risk Interventions     No data to display

## 2022-12-18 NOTE — Progress Notes (Signed)
Nutrition Follow-up  DOCUMENTATION CODES:   Not applicable  INTERVENTION:   Encourage PO intake on Dysphagia 1 diet  - Diet advancement per SLP - Provide Ensure Enlive BID, each supplement provides 350 kcal and 20 grams of protein. - Provide Magic cup TID with meals, each supplement provides 290 kcal and 9 grams of protein - Provide MVI with minerals daily    NUTRITION DIAGNOSIS:   Inadequate oral intake related to acute illness (stroke) as evidenced by  (Dysphagia 1 diet).    GOAL:   Patient will meet greater than or equal to 90% of their needs    MONITOR:   PO intake, Diet advancement, Supplement acceptance  REASON FOR ASSESSMENT:   Consult Assessment of nutrition requirement/status  ASSESSMENT:   77 y.o F, with PMH of T2DM, HTN, anemia, afib on Eliquis,cholangiocarcinoma of the liver, started chemotherapy on 11/21/22. Presnted with left sided weakness found to have subcortical ICH.  No change in oral intake continues to be poor.  Pt stated that her son will bring her in [place], because he thinks that Ensure has too much sugar. Discussed with her that it was ok to drink ensures due to her low oral intake of meals. She was agreeable to this.    NUTRITION - FOCUSED PHYSICAL EXAM:  Flowsheet Row Most Recent Value  Orbital Region No depletion  Upper Arm Region Unable to assess  [Left side weakness and cuff on right arm]  Thoracic and Lumbar Region Mild depletion  Buccal Region Mild depletion  Temple Region Mild depletion  Clavicle and Acromion Bone Region Mild depletion  Scapular Bone Region Mild depletion  Dorsal Hand Mild depletion  Patellar Region Mild depletion  Anterior Thigh Region Mild depletion  Posterior Calf Region No depletion  Edema (RD Assessment) None  Hair Reviewed  Eyes Reviewed  Mouth Reviewed  Skin Reviewed  Nails Reviewed       Diet Order:   Diet Order             DIET DYS 2 Room service appropriate? Yes with Assist; Fluid  consistency: Thin  Diet effective 1000                   EDUCATION NEEDS:   Not appropriate for education at this time  Skin:  Skin Assessment: Reviewed RN Assessment  Last BM:  12/08  Height:   Ht Readings from Last 1 Encounters:  12/06/22 5' 9.5" (1.765 m)    Weight:   Wt Readings from Last 1 Encounters:  12/06/22 84.1 kg    Ideal Body Weight:  68.18 kg  BMI:  Body mass index is 26.99 kg/m.  Estimated Nutritional Needs:   Kcal:  1700-1900  Protein:  100-120 gm  Fluid:  >1.7L    Jamelle Haring RDN, LDN Clinical Dietitian  Pleas see Amion for contact information

## 2022-12-18 NOTE — TOC Progression Note (Signed)
Transition of Care [facility]) - Progression Note    Patient Details  Name: Erin Hoffman MRN: [id number] Date of Birth: 1945-03-22  Transition of Care [facility]) CM/SW Contact  Eduard Roux, [place] Phone Number: 12/18/2022, 3:12 PM  Clinical Narrative:     Informed patient [name] bed offer today and reversed denial. Patient states her preferred [name] because she has family there and requested to switch from [name] to [name].  [facility] and SNFs -provided update  TOC will continue to follow and assist with discharge planning.  Antony Blackbird, MSW, LCSW Clinical Social Worker    Expected Discharge Plan: IP Rehab Facility Barriers to Discharge: Continued Medical Work up  Expected Discharge Plan and Services                                               Social Determinants of Health (SDOH) Interventions SDOH Screenings   Food Insecurity: No Food Insecurity (12/06/2022)  Housing: Low Risk  (12/06/2022)  Transportation Needs: No Transportation Needs (12/06/2022)  Utilities: Not At Risk (12/06/2022)  Tobacco Use: Medium Risk (12/05/2022)    Readmission Risk Interventions     No data to display

## 2022-12-19 ENCOUNTER — Inpatient Hospital Stay: Payer: HMO | Admitting: Hematology

## 2022-12-19 ENCOUNTER — Ambulatory Visit: Payer: HMO

## 2022-12-19 ENCOUNTER — Inpatient Hospital Stay: Payer: HMO

## 2022-12-19 DIAGNOSIS — R402421 Glasgow coma scale score 9-12, in the field [EMT or ambulance]: Secondary | ICD-10-CM | POA: Diagnosis not present

## 2022-12-19 DIAGNOSIS — C249 Malignant neoplasm of biliary tract, unspecified: Secondary | ICD-10-CM | POA: Diagnosis not present

## 2022-12-19 DIAGNOSIS — G8190 Hemiplegia, unspecified affecting unspecified side: Secondary | ICD-10-CM | POA: Diagnosis not present

## 2022-12-19 DIAGNOSIS — K521 Toxic gastroenteritis and colitis: Secondary | ICD-10-CM | POA: Diagnosis not present

## 2022-12-19 DIAGNOSIS — R410 Disorientation, unspecified: Secondary | ICD-10-CM | POA: Diagnosis present

## 2022-12-19 DIAGNOSIS — I6782 Cerebral ischemia: Secondary | ICD-10-CM | POA: Diagnosis not present

## 2022-12-19 DIAGNOSIS — E039 Hypothyroidism, unspecified: Secondary | ICD-10-CM | POA: Diagnosis present

## 2022-12-19 DIAGNOSIS — K219 Gastro-esophageal reflux disease without esophagitis: Secondary | ICD-10-CM | POA: Diagnosis present

## 2022-12-19 DIAGNOSIS — I69154 Hemiplegia and hemiparesis following nontraumatic intracerebral hemorrhage affecting left non-dominant side: Secondary | ICD-10-CM | POA: Diagnosis not present

## 2022-12-19 DIAGNOSIS — E119 Type 2 diabetes mellitus without complications: Secondary | ICD-10-CM | POA: Diagnosis not present

## 2022-12-19 DIAGNOSIS — A419 Sepsis, unspecified organism: Secondary | ICD-10-CM | POA: Diagnosis not present

## 2022-12-19 DIAGNOSIS — R000 Tachycardia, unspecified: Secondary | ICD-10-CM | POA: Diagnosis not present

## 2022-12-19 DIAGNOSIS — R509 Fever, unspecified: Secondary | ICD-10-CM | POA: Diagnosis not present

## 2022-12-19 DIAGNOSIS — I4891 Unspecified atrial fibrillation: Secondary | ICD-10-CM | POA: Diagnosis not present

## 2022-12-19 DIAGNOSIS — D72829 Elevated white blood cell count, unspecified: Secondary | ICD-10-CM | POA: Diagnosis not present

## 2022-12-19 DIAGNOSIS — I672 Cerebral atherosclerosis: Secondary | ICD-10-CM | POA: Diagnosis not present

## 2022-12-19 DIAGNOSIS — E16A3 Hypoglycemia level 3: Secondary | ICD-10-CM | POA: Diagnosis present

## 2022-12-19 DIAGNOSIS — Z66 Do not resuscitate: Secondary | ICD-10-CM | POA: Diagnosis present

## 2022-12-19 DIAGNOSIS — I959 Hypotension, unspecified: Secondary | ICD-10-CM | POA: Diagnosis not present

## 2022-12-19 DIAGNOSIS — I482 Chronic atrial fibrillation, unspecified: Secondary | ICD-10-CM | POA: Diagnosis not present

## 2022-12-19 DIAGNOSIS — I4819 Other persistent atrial fibrillation: Secondary | ICD-10-CM | POA: Diagnosis present

## 2022-12-19 DIAGNOSIS — B974 Respiratory syncytial virus as the cause of diseases classified elsewhere: Secondary | ICD-10-CM | POA: Diagnosis present

## 2022-12-19 DIAGNOSIS — E785 Hyperlipidemia, unspecified: Secondary | ICD-10-CM | POA: Diagnosis present

## 2022-12-19 DIAGNOSIS — R55 Syncope and collapse: Secondary | ICD-10-CM | POA: Diagnosis not present

## 2022-12-19 DIAGNOSIS — I610 Nontraumatic intracerebral hemorrhage in hemisphere, subcortical: Secondary | ICD-10-CM | POA: Diagnosis not present

## 2022-12-19 DIAGNOSIS — G8194 Hemiplegia, unspecified affecting left nondominant side: Secondary | ICD-10-CM | POA: Diagnosis not present

## 2022-12-19 DIAGNOSIS — B338 Other specified viral diseases: Secondary | ICD-10-CM | POA: Diagnosis not present

## 2022-12-19 DIAGNOSIS — L89153 Pressure ulcer of sacral region, stage 3: Secondary | ICD-10-CM | POA: Diagnosis not present

## 2022-12-19 DIAGNOSIS — R404 Transient alteration of awareness: Secondary | ICD-10-CM | POA: Diagnosis not present

## 2022-12-19 DIAGNOSIS — I69192 Facial weakness following nontraumatic intracerebral hemorrhage: Secondary | ICD-10-CM | POA: Diagnosis not present

## 2022-12-19 DIAGNOSIS — I10 Essential (primary) hypertension: Secondary | ICD-10-CM | POA: Diagnosis present

## 2022-12-19 DIAGNOSIS — Z6826 Body mass index (BMI) 26.0-26.9, adult: Secondary | ICD-10-CM | POA: Diagnosis not present

## 2022-12-19 DIAGNOSIS — E440 Moderate protein-calorie malnutrition: Secondary | ICD-10-CM | POA: Diagnosis present

## 2022-12-19 DIAGNOSIS — Z1152 Encounter for screening for COVID-19: Secondary | ICD-10-CM | POA: Diagnosis not present

## 2022-12-19 DIAGNOSIS — J90 Pleural effusion, not elsewhere classified: Secondary | ICD-10-CM | POA: Diagnosis present

## 2022-12-19 DIAGNOSIS — E871 Hypo-osmolality and hyponatremia: Secondary | ICD-10-CM | POA: Diagnosis present

## 2022-12-19 DIAGNOSIS — E11649 Type 2 diabetes mellitus with hypoglycemia without coma: Secondary | ICD-10-CM | POA: Diagnosis present

## 2022-12-19 DIAGNOSIS — R4182 Altered mental status, unspecified: Secondary | ICD-10-CM | POA: Diagnosis not present

## 2022-12-19 DIAGNOSIS — Z510 Encounter for antineoplastic radiation therapy: Secondary | ICD-10-CM | POA: Diagnosis present

## 2022-12-19 DIAGNOSIS — G9341 Metabolic encephalopathy: Secondary | ICD-10-CM | POA: Diagnosis present

## 2022-12-19 DIAGNOSIS — E872 Acidosis, unspecified: Secondary | ICD-10-CM | POA: Diagnosis present

## 2022-12-19 DIAGNOSIS — N390 Urinary tract infection, site not specified: Secondary | ICD-10-CM | POA: Diagnosis not present

## 2022-12-19 DIAGNOSIS — M6281 Muscle weakness (generalized): Secondary | ICD-10-CM | POA: Diagnosis not present

## 2022-12-19 DIAGNOSIS — C221 Intrahepatic bile duct carcinoma: Secondary | ICD-10-CM | POA: Diagnosis present

## 2022-12-19 DIAGNOSIS — I639 Cerebral infarction, unspecified: Secondary | ICD-10-CM | POA: Diagnosis not present

## 2022-12-19 DIAGNOSIS — E860 Dehydration: Secondary | ICD-10-CM | POA: Diagnosis present

## 2022-12-19 DIAGNOSIS — C7801 Secondary malignant neoplasm of right lung: Secondary | ICD-10-CM | POA: Diagnosis present

## 2022-12-19 DIAGNOSIS — C799 Secondary malignant neoplasm of unspecified site: Secondary | ICD-10-CM | POA: Diagnosis not present

## 2022-12-19 DIAGNOSIS — Z452 Encounter for adjustment and management of vascular access device: Secondary | ICD-10-CM | POA: Diagnosis not present

## 2022-12-19 DIAGNOSIS — Z7189 Other specified counseling: Secondary | ICD-10-CM | POA: Diagnosis not present

## 2022-12-19 DIAGNOSIS — C240 Malignant neoplasm of extrahepatic bile duct: Secondary | ICD-10-CM | POA: Diagnosis not present

## 2022-12-19 DIAGNOSIS — Z7401 Bed confinement status: Secondary | ICD-10-CM | POA: Diagnosis not present

## 2022-12-19 DIAGNOSIS — C7951 Secondary malignant neoplasm of bone: Secondary | ICD-10-CM | POA: Diagnosis present

## 2022-12-19 DIAGNOSIS — A0811 Acute gastroenteropathy due to Norwalk agent: Secondary | ICD-10-CM | POA: Diagnosis present

## 2022-12-19 DIAGNOSIS — G893 Neoplasm related pain (acute) (chronic): Secondary | ICD-10-CM | POA: Diagnosis not present

## 2022-12-19 DIAGNOSIS — Z9181 History of falling: Secondary | ICD-10-CM | POA: Diagnosis not present

## 2022-12-19 DIAGNOSIS — N289 Disorder of kidney and ureter, unspecified: Secondary | ICD-10-CM | POA: Diagnosis not present

## 2022-12-19 DIAGNOSIS — K5900 Constipation, unspecified: Secondary | ICD-10-CM | POA: Diagnosis not present

## 2022-12-19 DIAGNOSIS — R1311 Dysphagia, oral phase: Secondary | ICD-10-CM | POA: Diagnosis not present

## 2022-12-19 DIAGNOSIS — Z515 Encounter for palliative care: Secondary | ICD-10-CM | POA: Diagnosis not present

## 2022-12-19 DIAGNOSIS — R41841 Cognitive communication deficit: Secondary | ICD-10-CM | POA: Diagnosis not present

## 2022-12-19 DIAGNOSIS — M6259 Muscle wasting and atrophy, not elsewhere classified, multiple sites: Secondary | ICD-10-CM | POA: Diagnosis not present

## 2022-12-19 DIAGNOSIS — Z794 Long term (current) use of insulin: Secondary | ICD-10-CM | POA: Diagnosis not present

## 2022-12-19 DIAGNOSIS — E162 Hypoglycemia, unspecified: Secondary | ICD-10-CM | POA: Diagnosis not present

## 2022-12-19 DIAGNOSIS — I69152 Hemiplegia and hemiparesis following nontraumatic intracerebral hemorrhage affecting left dominant side: Secondary | ICD-10-CM | POA: Diagnosis not present

## 2022-12-19 DIAGNOSIS — G9389 Other specified disorders of brain: Secondary | ICD-10-CM | POA: Diagnosis not present

## 2022-12-19 LAB — GLUCOSE, CAPILLARY
Glucose-Capillary: 138 mg/dL — ABNORMAL HIGH (ref 70–99)
Glucose-Capillary: 246 mg/dL — ABNORMAL HIGH (ref 70–99)
Glucose-Capillary: 132 mg/dL — ABNORMAL HIGH (ref 70–99)

## 2022-12-19 MED ORDER — ROSUVASTATIN CALCIUM 20 MG PO TABS: 20.0000 mg | ORAL_TABLET | Freq: Every day | ORAL | 0 refills | Status: AC

## 2022-12-19 MED ORDER — ENALAPRIL MALEATE 10 MG PO TABS: 10.0000 mg | ORAL_TABLET | Freq: Every day | ORAL | 0 refills | Status: DC

## 2022-12-19 MED ORDER — HEPARIN SOD (PORK) LOCK FLUSH 100 UNIT/ML IV SOLN
500.0000 [IU] | INTRAVENOUS | Status: AC | PRN
  Administered 2022-12-19: 500 [IU]
  Filled 2022-12-19: qty 5

## 2022-12-19 MED ORDER — NYSTATIN 100000 UNIT/GM EX POWD: Freq: Three times a day (TID) | CUTANEOUS | 0 refills | Status: DC

## 2022-12-19 MED ORDER — AMLODIPINE BESYLATE 10 MG PO TABS: 10.0000 mg | ORAL_TABLET | Freq: Every day | ORAL | 0 refills | Status: DC

## 2022-12-19 NOTE — [place]2 (Signed)
[place] MEDICAID FL2 LEVEL OF CARE FORM     IDENTIFICATION  Patient Name: Erin Hoffman Birthdate: [birth year] Sex: female Admission Date (Current Location): 12/05/2022  [facility] and [place] Number:  Producer, television/film/video and Address:  [facility], [address], [place], [place] [address]      Provider Number: [id number]  Attending Physician Name and Address:  Azucena Fallen, MD  Relative Name and Phone Number:  Latriece, Wehrs [phone])  [phone] [facility])    Current Level of Care: Hospital Recommended Level of Care: Skilled Nursing Facility Prior Approval Number:    Date Approved/Denied:   PASRR Number: [id number]  Discharge Plan: SNF    Current Diagnoses: Patient Active Problem List   Diagnosis Date Noted   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) 12/06/2022   Cholangiocarcinoma (HCC) 11/13/2022   Hypothyroidism 10/23/2022   DMII (diabetes mellitus, type 2) (HCC) 10/23/2022   GERD (gastroesophageal reflux disease) 10/23/2022   HLD (hyperlipidemia) 10/23/2022   HTN (hypertension) 10/23/2022   Liver mass, left lobe 10/23/2022   New onset a-fib (HCC) 10/23/2022   Atrial fibrillation (HCC) 10/22/2022    Orientation RESPIRATION BLADDER Height & Weight     Self, Time, Situation, Place  Normal External catheter Weight: 185 lb 6.5 oz (84.1 kg) Height:  5' 9.5" (176.5 cm)  BEHAVIORAL SYMPTOMS/MOOD NEUROLOGICAL BOWEL NUTRITION STATUS      Incontinent Diet (see d/c summary)  AMBULATORY STATUS COMMUNICATION OF NEEDS Skin   Extensive Assist Verbally Normal                       Personal Care Assistance Level of Assistance  Bathing, Feeding, Dressing Bathing Assistance: Limited assistance Feeding assistance: Independent Dressing Assistance: Limited assistance     Functional Limitations Info  Sight, Speech, Hearing Sight Info: Impaired Hearing Info: Adequate Speech Info: Adequate    SPECIAL  CARE FACTORS FREQUENCY  OT (By licensed OT), PT (By licensed PT)     PT Frequency: 5x/week OT Frequency: 5x/week            Contractures Contractures Info: Not present    Additional Factors Info  Code Status, Allergies Code Status Info: DNR-limited Allergies Info: Tape           Current Medications (12/19/2022):  This is the current hospital active medication list Current Facility-Administered Medications  Medication Dose Route Frequency Provider Last Rate Last Admin   acetaminophen (TYLENOL) tablet 500 mg  500 mg Oral Q6H Alekh, Kshitiz, MD   500 mg at 12/19/22 0433   ALPRAZolam (XANAX) tablet 0.5 mg  0.5 mg Oral QHS PRN Glade Lloyd, MD   0.5 mg at 12/17/22 2125   alum & mag hydroxide-simeth (MAALOX/MYLANTA) 200-200-20 MG/5ML suspension 15 mL  15 mL Oral Q4H PRN [name], MD   15 mL at 12/13/22 1157   amLODipine (NORVASC) tablet 10 mg  10 mg Oral Daily Marvel Plan, MD   10 mg at 12/19/22 4010   Chlorhexidine Gluconate Cloth 2 % PADS 6 each  6 each Topical Daily Bhagat, Srishti L, MD   6 each at 12/19/22 0923   enalapril (VASOTEC) tablet 10 mg  10 mg Oral Daily Marvel Plan, MD   10 mg at 12/19/22 2725   feeding supplement (GLUCERNA SHAKE) (GLUCERNA SHAKE) liquid 237 mL  237 mL Oral BID BM [name], MD   237 mL at 12/19/22 3664   Gerhardt's butt cream   Topical PRN  [name], MD   Given at 12/19/22 [phone]   hydrOXYzine (ATARAX) tablet 10 mg  10 mg Oral TID PRN [name], MD   10 mg at 12/19/22 0923   insulin aspart (novoLOG) injection 0-15 Units  0-15 Units Subcutaneous TID WC [name], MD   2 Units at 12/19/22 9604   insulin aspart (novoLOG) injection 0-5 Units  0-5 Units Subcutaneous QHS Gevena Mart A, NP   2 Units at 12/18/22 2249   insulin aspart (novoLOG) injection 3 Units  3 Units Subcutaneous TID WC [name], MD   3 Units at 12/19/22 5409   insulin glargine-yfgn (SEMGLEE) injection 30 Units  30 Units Subcutaneous QHS [name], MD   30  Units at 12/18/22 2249   labetalol (NORMODYNE) injection 10-20 mg  10-20 mg Intravenous Q2H PRN Gevena Mart A, NP   20 mg at 12/08/22 0207   levothyroxine (SYNTHROID) tablet 137 mcg  137 mcg Oral QAC breakfast Gevena Mart A, NP   137 mcg at 12/19/22 0433   melatonin tablet 3 mg  3 mg Oral QHS Ernie Avena, NP   3 mg at 12/18/22 2248   multivitamin with minerals tablet 1 tablet  1 tablet Oral Daily Glade Lloyd, MD   1 tablet at 12/19/22 8119   nystatin (MYCOSTATIN/NYSTOP) topical powder   Topical TID [name], MD   Given at 12/19/22 [phone]   Oral care mouth rinse  15 mL Mouth Rinse 4 times per day [name], MD   15 mL at 12/19/22 2956   Oral care mouth rinse  15 mL Mouth Rinse PRN [name], MD       oxyCODONE (Oxy IR/ROXICODONE) immediate release tablet 5 mg  5 mg Oral Q6H PRN Glade Lloyd, MD   5 mg at 12/19/22 0929   pantoprazole (PROTONIX) EC tablet 40 mg  40 mg Oral Daily Marvel Plan, MD   40 mg at 12/19/22 0923   polyethylene glycol (MIRALAX / GLYCOLAX) packet 17 g  17 g Oral Daily PRN Glade Lloyd, MD   17 g at 12/13/22 2130   prochlorperazine (COMPAZINE) injection 10 mg  10 mg Intravenous Q6H PRN Hetty Blend C, NP       rosuvastatin (CRESTOR) tablet 20 mg  20 mg Oral QHS Alekh, Kshitiz, MD   20 mg at 12/18/22 2248   senna-docusate (Senokot-S) tablet 1 tablet  1 tablet Oral BID Bhagat, Srishti L, MD   1 tablet at 12/19/22 0923   sodium chloride flush (NS) 0.9 % injection 10-40 mL  10-40 mL Intracatheter Q12H Bhagat, Srishti L, MD   10 mL at 12/19/22 8657     Discharge Medications: Please see discharge summary for a list of discharge medications.  Relevant Imaging Results:  Relevant Lab Results:   Additional Information SSN [id number] [place], [place]

## 2022-12-19 NOTE — TOC Transition Note (Signed)
Transition of Care [facility]) - CM/SW Discharge Note   Patient Details  Name: Erin Hoffman MRN: [id number] Date of Birth: 19-Apr-1945  [facility] of [facility]) CM/SW Contact:  Eduard Roux, LCSW Phone Number: 12/19/2022, 11:54 AM   Clinical Narrative:     Patient will Discharge to: [facility]  Discharge Date: 12/19/2022 Family Notified: son Transport NW:[facility]  Per MD patient is ready for discharge. RN, patient, and facility notified of discharge. Discharge Summary sent to facility. RN given number for [phone], RM [phone]. Ambulance transport requested for patient.   Clinical Social Worker signing off.  Antony Blackbird, MSW, LCSW Clinical Social Worker     Final next level of care: Skilled Nursing Facility Barriers to Discharge: Barriers Resolved   Patient Goals and CMS Choice      Discharge Placement                Patient chooses bed at: [facility] Patient to be transferred to facility by: [facility] Name of family member notified: son Patient and family notified of of transfer: 12/19/22  Discharge Plan and Services Additional resources added to the After Visit Summary for                                       Social Determinants of Health (SDOH) Interventions SDOH Screenings   Food Insecurity: No Food Insecurity (12/06/2022)  Housing: Low Risk  (12/06/2022)  Transportation Needs: No Transportation Needs (12/06/2022)  Utilities: Not At Risk (12/06/2022)  Tobacco Use: Medium Risk (12/05/2022)     Readmission Risk Interventions     No data to display

## 2022-12-19 NOTE — Discharge Summary (Signed)
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  She was found to have intracranial hemorrhage.  Given Andexxa in ED and started on Cleviprex for BP control.  She was transferred to [facility] ICU and admitted under neurology service.  PT recommended CIR.  Care transferred to [facility] service from 12/08/2022 onwards.  Patient continues to improve - no longer requiring intensive therapy and approved to DC to SNF for ongoing therapy. Follow up with PCP and neuro as recommended. Medication changes as outlined below - off anticoagulation given recent bleeding. Repeat MRI in 3 months given small parietal bone lesion - defer to primary oncologist for specific imaging recommendations.  Discharge Diagnoses:  Principal Problem:   Nontraumatic subcortical hemorrhage of right cerebral hemisphere [facility])    Discharge Instructions   Allergies as of 12/19/2022       Reactions   Tape Itching, Rash        Medication List     STOP taking these medications    ALPRAZolam 0.5 MG tablet Commonly known as: XANAX   apixaban 5 MG Tabs tablet Commonly known as: ELIQUIS   oxyCODONE 5 MG immediate release tablet Commonly known as: Roxicodone       TAKE these medications    acetaminophen 500 MG tablet Commonly known as:  TYLENOL Take 500 mg by mouth every 6 (six) hours as needed for moderate pain (pain score 4-6).   amLODipine 10 MG tablet Commonly known as: NORVASC Take 1 tablet (10 mg total) by mouth daily. Start taking on: December 20, 2022   cyanocobalamin 1000 MCG/ML injection Commonly known as: VITAMIN B12 Inject 1,000 mcg into the muscle every 30 (thirty) days.   docusate sodium 100 MG capsule Commonly known as: COLACE Take 100 mg by mouth daily.   enalapril 10 MG tablet Commonly known as: VASOTEC Take 1 tablet (10 mg total) by mouth daily. Start taking on: December 20, 2022 What changed:  medication strength how much to take   famotidine 20 MG tablet Commonly known as: PEPCID Take 20 mg by mouth 2 (two) times daily as needed.   fenofibrate micronized 134 MG capsule Commonly known as: LOFIBRA Take 134 mg by mouth daily before breakfast.   fexofenadine 180 MG tablet Commonly known as: ALLEGRA Take 180 mg by mouth daily as needed for allergies or rhinitis.   Fish Oil 1200 MG Caps Take 1 capsule by mouth daily.   glimepiride 4 MG tablet Commonly known as: AMARYL Take 4 mg by mouth 2 (two) times daily.   insulin glargine 100 UNIT/ML injection Commonly known as: LANTUS Inject 40 Units into the skin at bedtime as needed (high blood sugar).   levothyroxine 137 MCG tablet Commonly known as: SYNTHROID Take 137 mcg by mouth daily before breakfast.   lidocaine-prilocaine cream Commonly known as: EMLA Apply to affected area once   magnesium oxide 400 (240 Mg) MG tablet Commonly known as: MAG-OX  Take 1 tablet (400 mg total) by mouth 2 (two) times daily.   metFORMIN 500 MG tablet Commonly known as: GLUCOPHAGE Take 500 mg by mouth 2 (two) times daily with a meal.   nystatin powder Commonly known as: MYCOSTATIN/NYSTOP Apply topically 3 (three) times daily.   pantoprazole 40 MG tablet Commonly known as: PROTONIX Take 1 tablet (40 mg total) by mouth daily.    prochlorperazine 10 MG tablet Commonly known as: COMPAZINE Take 1 tablet (10 mg total) by mouth every 6 (six) hours as needed (Nausea or vomiting).   rosuvastatin 20 MG tablet Commonly known as: CRESTOR Take 1 tablet (20 mg total) by mouth at bedtime. What changed:  medication strength how much to take when to take this   triamcinolone cream 0.1 % Commonly known as: KENALOG Apply 1 Application topically 2 (two) times daily. What changed:  when to take this reasons to take this        Contact information for after-discharge care     Destination     [facility], [facility]erred [facility]F .   Service: Skilled Nursing Contact information: [address] [place] [address] [phone]                    Allergies  Allergen Reactions   Tape Itching and Rash    Consultations: Neuro   Procedures/Studies: CT HEAD WO CONTRAST ( )  Result Date: 12/07/2022 CLINICAL DATA:  Stroke, hemorrhagic. EXAM: CT HEAD WITHOUT CONTRAST TECHNIQUE: Contiguous axial images were obtained from the base of the skull through the vertex without intravenous contrast. RADIATION DOSE REDUCTION: This exam was performed according to the departmental dose-optimization program which includes automated exposure control, adjustment of the mA and/or kV according to patient size and/or use of iterative reconstruction technique. COMPARISON:  None Available. FINDINGS: Brain: Mild interval increase in size of an intraparenchymal hemorrhage centered in the right thalamus which now measures up to 2.9 x 2.7 x 2.1 cm on reformatted imaging (previously 2.8 x 2.4 x 2.0 cm when remeasured similarly on the prior). Slight interval increase in leftward midline shift (up to 3 mm). Mild interval increase in small volume intraventricular hemorrhage. No evidence of acute large vascular territory infarct, visible mass lesion or hydrocephalus. Vascular: No hyperdense vessel identified.  Skull: No acute fracture. Sinuses/Orbits: Clear sinuses.  No acute orbital findings. IMPRESSION: 1. Mild interval increase in size of an intraparenchymal hemorrhage centered in the right thalamus which now measures up to 2.9 x 2.7 x 2.1 cm (previously 2.8 x 2.4 x 2.0 cm when remeasured similarly on the prior). 2. Slight increase in leftward midline shift (up to 3 mm). 3. Mild interval increase in small volume intraventricular hemorrhage. These results will be called to the ordering clinician or representative by the Radiologist Assistant, and communication documented in the PACS or [employer]. Electronically Signed   By: Feliberto Harts M.D.   On: 12/07/2022 08:44   ECHOCARDIOGRAM COMPLETE  Result Date: 12/06/2022    ECHOCARDIOGRAM REPORT   Patient Name:   TERREL NOAKES Date of Exam: 12/06/2022 Medical Rec #:  [id number]          Height:       69.5 in Accession #:    [id number]         Weight:       185.4 lb Date of Birth:  05/13/1945          BSA:  2.010 m Patient Age:    26 years           BP:           130/59 mmHg Patient Gender: F                  HR:           57 bpm. Exam Location:  Inpatient Procedure: 2D Echo, Cardiac Doppler and Color Doppler Indications:    Stroke  History:        Patient has prior history of Echocardiogram examinations, most                 recent 10/23/2022. Risk Factors:Hypertension and Diabetes.  Sonographer:    Darlys Gales Referring Phys: [id number] SRISHTI L BHAGAT IMPRESSIONS  1. Left ventricular ejection fraction, by estimation, is 60 to 65%. The left ventricle has normal function. Left ventricular endocardial border not optimally defined to evaluate regional wall motion. Left ventricular diastolic parameters are indeterminate.  2. Right ventricular systolic function is normal. The right ventricular size is mildly enlarged. There is moderately elevated pulmonary artery systolic pressure. The estimated right ventricular systolic pressure is 49.1 mmHg.  3. Left  atrial size was mild to moderately dilated.  4. The mitral valve is normal in structure. Mild mitral valve regurgitation. No evidence of mitral stenosis.  5. The aortic valve is tricuspid. Aortic valve regurgitation is not visualized. No aortic stenosis is present.  6. The inferior vena cava is dilated in size with <50% respiratory variability, suggesting right atrial pressure of 15 mmHg. FINDINGS  Left Ventricle: Left ventricular ejection fraction, by estimation, is 60 to 65%. The left ventricle has normal function. Left ventricular endocardial border not optimally defined to evaluate regional wall motion. The left ventricular internal cavity size was normal in size. There is no left ventricular hypertrophy. Left ventricular diastolic parameters are indeterminate. Right Ventricle: The right ventricular size is mildly enlarged. No increase in right ventricular wall thickness. Right ventricular systolic function is normal. There is moderately elevated pulmonary artery systolic pressure. The tricuspid regurgitant velocity is 2.92 m/s, and with an assumed right atrial pressure of 15 mmHg, the estimated right ventricular systolic pressure is 49.1 mmHg. Left Atrium: Left atrial size was mild to moderately dilated. Right Atrium: Right atrial size was normal in size. Pericardium: There is no evidence of pericardial effusion. Mitral Valve: The mitral valve is normal in structure. Mild mitral valve regurgitation. No evidence of mitral valve stenosis. Tricuspid Valve: The tricuspid valve is grossly normal. Tricuspid valve regurgitation is mild . No evidence of tricuspid stenosis. Aortic Valve: The aortic valve is tricuspid. Aortic valve regurgitation is not visualized. No aortic stenosis is present. Aortic valve mean gradient measures 6.0 mmHg. Aortic valve peak gradient measures 11.3 mmHg. Aortic valve area, by VTI measures 2.12  cm. Pulmonic Valve: The pulmonic valve was not well visualized. Pulmonic valve regurgitation is  not visualized. No evidence of pulmonic stenosis. Aorta: The aortic root is normal in size and structure. Venous: The inferior vena cava is dilated in size with less than 50% respiratory variability, suggesting right atrial pressure of 15 mmHg. IAS/Shunts: No atrial level shunt detected by color flow Doppler.  LEFT VENTRICLE PLAX 2D LVIDd:         5.20 cm   Diastology LVIDs:         3.70 cm   LV e' medial:    7.62 cm/s LV PW:  1.10 cm   LV E/e' medial:  14.7 LV IVS:        1.00 cm   LV e' lateral:   9.68 cm/s LVOT diam:     1.90 cm   LV E/e' lateral: 11.6 LV SV:         75 LV SV Index:   38 LVOT Area:     2.84 cm  RIGHT VENTRICLE             IVC RV S prime:     10.20 cm/s  IVC diam: 2.40 cm LEFT ATRIUM             Index        RIGHT ATRIUM           Index LA Vol (A2C):   43.8 ml 21.79 ml/m  RA Area:     18.90 cm LA Vol (A4C):   83.1 ml 41.33 ml/m  RA Volume:   57.90 ml  28.80 ml/m LA Biplane Vol: 66.3 ml 32.98 ml/m  AORTIC VALVE AV Area (Vmax):    2.06 cm AV Area (Vmean):   2.21 cm AV Area (VTI):     2.12 cm AV Vmax:           168.00 cm/s AV Vmean:          114.000 cm/s AV VTI:            0.356 m AV Peak Grad:      11.3 mmHg AV Mean Grad:      6.0 mmHg LVOT Vmax:         122.00 cm/s LVOT Vmean:        89.000 cm/s LVOT VTI:          0.266 m LVOT/AV VTI ratio: 0.75  AORTA Ao Root diam: 2.90 cm MITRAL VALVE                TRICUSPID VALVE MV Area (PHT): 3.89 cm     TR Peak grad:   34.1 mmHg MV Decel Time: 195 msec     TR Vmax:        292.00 cm/s MV E velocity: 112.00 cm/s MV A velocity: 38.80 cm/s   SHUNTS MV E/A ratio:  2.89         Systemic VTI:  0.27 m                             Systemic Diam: 1.90 cm Vishnu Priya Mallipeddi Electronically signed by Winfield Rast Mallipeddi Signature Date/Time: 12/06/2022/11:13:58 AM    Final    MR BRAIN W WO CONTRAST  Result Date: 12/06/2022 CLINICAL DATA:  Stroke, hemorrhagic EXAM: MRI HEAD WITHOUT AND WITH CONTRAST TECHNIQUE: Multiplanar, multiecho pulse  sequences of the brain and surrounding structures were obtained without and with intravenous contrast. CONTRAST:  8mL GADAVIST GADOBUTROL 1 MMOL/ML IV SOLN COMPARISON:  Same day CT head FINDINGS: Brain: Negative for an acute infarct. Redemonstrated parenchymal hemorrhage centered in the right thalamus. Accurate comparison is limited due to differences in technique, but the hemorrhage is unchanged in size compared to prior CTA head/neck measuring approximately 2.1 x 2.3 cm. There is intraventricular extension of blood products with otherwise unchanged size and shape of the ventricular system. There is mass effect on the right lateral ventricular system in the third ventricle there is a background of mild chronic microvascular ischemic change. There is a small microhemorrhage in the left thalamus there is  no evidence of a parenchymal contrast-enhancing lesion at the time of the exam to suggest a source for the hemorrhage. Vascular: Normal flow voids. Skull and upper cervical spine: There is a small contrast-enhancing lesion along the right parietal bone (series 10, image 36). There is an additional contrast-enhancing lesion along the right parietal bone that represent a small osseous hemangioma. Sinuses/Orbits: No middle ear or mastoid effusion. Paranasal sinuses are clear. Bilateral lens replacement. Orbits are otherwise unremarkable. Other: None IMPRESSION: 1. Redemonstrated parenchymal hemorrhage centered in the right thalamus with intraventricular extension of blood products. Accurate comparison is limited due to differences in technique, but the hemorrhage is unchanged in size compared to prior CTA head/neck. No evidence of an underlying parenchymal contrast-enhancing lesion to suggest a source for hemorrhage. 2. Small contrast-enhancing lesion along the right parietal bone, which is indeterminate. Recommend follow up brain MRI in 3 months to ensure stability. Electronically Signed   By: Lorenza Cambridge M.D.   On:  12/06/2022 11:11   DG HIP UNILAT WITH PELVIS 2-3 VIEWS LEFT  Result Date: 12/06/2022 CLINICAL DATA:  Fall.  Left hip pain. EXAM: DG HIP (WITH OR WITHOUT PELVIS) 2-3V LEFT COMPARISON:  04/21/2010. FINDINGS: No acute fracture or bone lesion. Mild concentric hip joint space narrowing, mild medial joint space compartment narrowing, more evident on the left. Marginal osteophytes from the bases of the femoral heads and superior acetabular bony prominence. SI joints and symphysis pubis are normally spaced and aligned. Stable old healed fracture of the inferior right pubic ramus. Contrast is seen within the bladder. Soft tissues otherwise unremarkable. IMPRESSION: 1. No fracture or acute finding. 2. Mild arthropathic changes of both hips. Electronically Signed   By: Amie Portland M.D.   On: 12/06/2022 10:09   CT ANGIO HEAD NECK W WO CM  Result Date: 12/06/2022 CLINICAL DATA:  Follow-up examination for stroke. EXAM: CT ANGIOGRAPHY HEAD AND NECK WITH AND WITHOUT CONTRAST TECHNIQUE: Multidetector CT imaging of the head and neck was performed using the standard protocol during bolus administration of intravenous contrast. Multiplanar CT image reconstructions and MIPs were obtained to evaluate the vascular anatomy. Carotid stenosis measurements (when applicable) are obtained utilizing NASCET criteria, using the distal internal carotid diameter as the denominator. RADIATION DOSE REDUCTION: This exam was performed according to the departmental dose-optimization program which includes automated exposure control, adjustment of the mA and/or kV according to patient size and/or use of iterative reconstruction technique. CONTRAST:  75mL OMNIPAQUE IOHEXOL 350 MG/ML SOLN COMPARISON:  CT from earlier the same day. FINDINGS: CTA NECK FINDINGS Aortic arch: Aortic arch within normal limits for caliber with standard 3 vessel morphology. Mild aortic atherosclerosis. No stenosis about the origin the great vessels. Right carotid  system: Right common and internal carotid arteries are patent without dissection. Mild atheromatous change about the right carotid bulb without hemodynamically significant stenosis. Left carotid system: Left common and internal carotid arteries are patent without dissection. Mild atheromatous change about the left carotid bulb without hemodynamically significant stenosis. Vertebral arteries: Both vertebral arteries arise from subclavian arteries. Right vertebral artery dominant. Vertebral arteries patent without stenosis or dissection. Skeleton: No discrete or worrisome osseous lesions. Moderate spondylosis present at C5-6. Other neck: No other acute finding. Upper chest: Emphysema. 3 mm subpleural pulmonary nodule present at the left upper lobe (series 5, image 1). This was not FDG avid on prior PET-CT. Pleural based metastasis involving the posterior right lung is partially visualized, but appears increased in size now measuring up to approximately 3.2 cm.  Invasion of the posterior right 6 rib and right T6 transverse process. Associated right pleural effusion. Right-sided Port-A-Cath in place. Review of the MIP images confirms the above findings CTA HEAD FINDINGS Anterior circulation: Atheromatous change seen about the carotid siphons without hemodynamically significant stenosis. A1 segments patent bilaterally. Normal anterior communicating artery complex. Anterior cerebral arteries are patent without stenosis. No M1 stenosis or occlusion. Distal MCA branches perfused and symmetric. Posterior circulation: Both V4 segments patent without significant stenosis. Both PICA patent. Basilar patent without stenosis. Superior cerebellar and posterior cerebral arteries patent bilaterally. Venous sinuses: Grossly patent allowing for timing the contrast bolus. Anatomic variants: None significant. No vascular malformation seen underlying the right thalamic hemorrhage. No spot sign. Since CT performed earlier the same day. The  hemorrhage has increased in size now measuring 2.1 x 2.6 x 3.3 cm (estimated volume 9 mL, previously 5 mL). Surrounding edema has somewhat worsened. Trace right-to-left shift is seen. Intraventricular extension again noted with blood in the adjacent right lateral ventricle, also increased. No hydrocephalus or trapping. Review of the MIP images confirms the above findings IMPRESSION: 1. Negative CTA for large vessel occlusion or other emergent finding. No vascular malformation seen underlying the right thalamic hemorrhage. 2. Interval increase in size of right thalamic hemorrhage now measuring 2.1 x 2.6 x 3.3 cm (estimated volume 9 mL, previously 5 mL). Surrounding edema has mildly worsened with trace right-to-left shift now seen. Intraventricular extension has also mildly worsened. No hydrocephalus or trapping. 3. Pleural based metastasis involving the posterior right lung, partially visualized, but appears increased in size from most recent PET-CT now measuring up to approximately 3.2 cm. Invasion of the posterior right sixth rib and right T6 transverse process. Associated right pleural effusion. 4. Aortic Atherosclerosis (ICD10-I70.0) and Emphysema (ICD10-J43.9). Electronically Signed   By: Rise Mu M.D.   On: 12/06/2022 05:02   CT Cervical Spine Wo Contrast  Result Date: 12/06/2022 CLINICAL DATA:  Left-sided weakness and neck trauma EXAM: CT CERVICAL SPINE WITHOUT CONTRAST TECHNIQUE: Multidetector CT imaging of the cervical spine was performed without intravenous contrast. Multiplanar CT image reconstructions were also generated. RADIATION DOSE REDUCTION: This exam was performed according to the departmental dose-optimization program which includes automated exposure control, adjustment of the mA and/or kV according to patient size and/or use of iterative reconstruction technique. COMPARISON:  None are available FINDINGS: Alignment: No evidence of traumatic malalignment. Skull base and vertebrae:  No acute fracture. Soft tissues and spinal canal: No prevertebral fluid or swelling. No visible canal hematoma. Disc levels: Multilevel spondylosis, disc space height loss, and degenerative endplate changes greatest at C5-C6 where it is moderate. Multilevel moderate to advanced facet arthropathy. Spinal canal narrowing is greatest at C3-C4 and C5-C6 where it is mild secondary to posterior disc osteophyte complexes. Upper chest: No acute abnormality. Other: Mild carotid atherosclerotic calcification. IMPRESSION: No acute fracture in the cervical spine. Electronically Signed   By: Minerva Fester M.D.   On: 12/06/2022 00:36   CT HEAD CODE STROKE WO CONTRAST  Result Date: 12/06/2022 CLINICAL DATA:  Code stroke. Initial evaluation for neuro deficit, stroke. EXAM: CT HEAD WITHOUT CONTRAST TECHNIQUE: Contiguous axial images were obtained from the base of the skull through the vertex without intravenous contrast. RADIATION DOSE REDUCTION: This exam was performed according to the departmental dose-optimization program which includes automated exposure control, adjustment of the mA and/or kV according to patient size and/or use of iterative reconstruction technique. COMPARISON:  None Available. FINDINGS: Brain: Age-related cerebral atrophy with chronic small vessel  ischemic disease. Acute intraparenchymal hemorrhage centered at the right thalamus measures 2.3 x 1.4 x 2.8 cm (estimated volume 5 mL). Mild localized edema without significant regional mass effect. Associated intraventricular extension with small volume blood in the right lateral ventricle. No hydrocephalus or trapping. No other acute intracranial hemorrhage. No other acute large vessel territory infarct. No mass lesion or midline shift. No hydrocephalus or extra-axial fluid collection. Vascular: No abnormal hyperdense vessel. Scattered vascular calcifications noted within the carotid siphons. Skull: Scalp soft tissues within normal limits.  Calvarium  intact. Sinuses/Orbits: Globes and orbital soft tissues within normal limits. Scattered mucosal thickening about the ethmoidal air cells. Paranasal sinuses are otherwise clear. No mastoid effusion. Other: 9. ASPECTS ([place] Stroke Program Early CT Score) Acute ICH, does not apply. IMPRESSION: 1. 2.3 x 1.4 x 2.8 cm acute intraparenchymal hemorrhage centered at the right thalamus (estimated volume 5 mL). Associated intraventricular extension with small volume blood in the right lateral ventricle. No hydrocephalus or trapping. 2. Underlying age-related cerebral atrophy with chronic small vessel ischemic disease. Critical Value/emergent results were called by telephone at the time of interpretation on 12/06/2022 at 12:11 am to provider [facility] , who verbally acknowledged these results. Electronically Signed   By: Rise Mu M.D.   On: 12/06/2022 00:14   IR IMAGING GUIDED PORT INSERTION  Result Date: 11/20/2022 CLINICAL DATA:  Cholangiocarcinoma and need for porta cath to begin chemotherapy. EXAM: IMPLANTED PORT A CATH PLACEMENT WITH ULTRASOUND AND FLUOROSCOPIC GUIDANCE ANESTHESIA/SEDATION: Moderate (conscious) sedation was employed during this procedure. A total of Versed 1.0 mg and Fentanyl 50 mcg was administered intravenously by radiology nursing. Moderate Sedation Time: 31 minutes. The patient's level of consciousness and vital signs were monitored continuously by radiology nursing throughout the procedure under my direct supervision. FLUOROSCOPY: 33 seconds.  3.0 mGy. PROCEDURE: The procedure, risks, benefits, and alternatives were explained to the patient. Questions regarding the procedure were encouraged and answered. The patient understands and consents to the procedure. A time-out was performed prior to initiating the procedure. Ultrasound was utilized to confirm patency of the right internal jugular vein. An ultrasound image was saved and recorded. The right neck and chest were  prepped with chlorhexidine in a sterile fashion, and a sterile drape was applied covering the operative field. Maximum barrier sterile technique with sterile gowns and gloves were used for the procedure. Local anesthesia was provided with 1% lidocaine. After creating a small venotomy incision, a 21 gauge needle was advanced into the right internal jugular vein under direct, real-time ultrasound guidance. Ultrasound image documentation was performed. After securing guidewire access, an 8 Fr dilator was placed. A J-wire was kinked to measure appropriate catheter length. A subcutaneous port pocket was then created along the upper chest wall utilizing sharp and blunt dissection. Portable cautery was utilized. The pocket was irrigated with sterile saline. A single lumen power injectable port was chosen for placement. The 8 Fr catheter was tunneled from the port pocket site to the venotomy incision. The port was placed in the pocket. External catheter was trimmed to appropriate length based on guidewire measurement. At the venotomy, an 8 Fr peel-away sheath was placed over a guidewire. The catheter was then placed through the sheath and the sheath removed. Final catheter positioning was confirmed and documented with a fluoroscopic spot image. The port was accessed with a needle and aspirated and flushed with heparinized saline. The access needle was removed. The venotomy and port pocket incisions were closed with subcutaneous 3-0 Monocryl and subcuticular  4-0 Vicryl. Dermabond was applied to both incisions. COMPLICATIONS: COMPLICATIONS None FINDINGS: After catheter placement, the tip lies at the cavo-atrial junction. The catheter aspirates normally and is ready for immediate use. IMPRESSION: Placement of single lumen port a cath via right internal jugular vein. The catheter tip lies at the cavo-atrial junction. A power injectable port a cath was placed and is ready for immediate use. Electronically Signed   By: Irish Lack M.D.   On: 11/20/2022 15:21     Subjective: No acute issues/events overnight   Discharge Exam: Vitals:   12/19/22 0436 12/19/22 0800  BP: (!) 157/68 (!) 153/70  Pulse: 60 71  Resp: 15 16  Temp: 98.1 F (36.7 C) 98.1 F (36.7 C)  SpO2: 95% 95%   Vitals:   12/18/22 1938 12/18/22 2329 12/19/22 0436 12/19/22 0800  BP: (!) 151/63 126/62 (!) 157/68 (!) 153/70  Pulse: 63 (!) 54 60 71  Resp: (!) 22 16 15 16   Temp: 98.3 F (36.8 C) 98.6 F (37 C) 98.1 F (36.7 C) 98.1 F (36.7 C)  TempSrc: Oral Oral Oral Oral  SpO2: 92% 94% 95% 95%  Weight:      Height:        General: Pt is alert, awake, not in acute distress Cardiovascular: RRR, S1/S2 +, no rubs, no gallops Respiratory: CTA bilaterally, no wheezing, no rhonchi Abdominal: Soft, NT, ND, bowel sounds + Extremities: no edema, no cyanosis, left hemiplegia noted    The results of significant diagnostics from this hospitalization (including imaging, microbiology, ancillary and laboratory) are listed below for reference.     Microbiology: No results found for this or any previous visit (from the past 240 hour(s)).   Labs: BNP (last 3 results) No results for input(s): "BNP" in the last 8760 hours. Basic Metabolic Panel: Recent Labs  Lab 12/13/22 0540  NA 135  K 4.0  CL 102  CO2 22  GLUCOSE 189*  BUN 22  CREATININE 0.79  CALCIUM 8.6*  MG 2.0   Liver Function Tests: Recent Labs  Lab 12/13/22 0540  AST 33  ALT 17  ALKPHOS 190*  BILITOT 0.7  PROT 5.4*  ALBUMIN 2.5*   No results for input(s): "LIPASE", "AMYLASE" in the last 168 hours. No results for input(s): "AMMONIA" in the last 168 hours. CBC: Recent Labs  Lab 12/13/22 0540 12/15/22 0948 12/17/22 0430 12/18/22 0504  WBC 40.6* 24.7* 25.9* 30.7*  HGB 11.9* 11.5* 12.0 12.8  HCT 35.6* 34.9* 35.2* 37.3  MCV 87.3 88.1 87.1 85.6  PLT 211 146* 227 297   Cardiac Enzymes: No results for input(s): "CKTOTAL", "CKMB", "CKMBINDEX", "TROPONINI" in  the last 168 hours. BNP: Invalid input(s): "POCBNP" CBG: Recent Labs  Lab 12/18/22 0823 12/18/22 1232 12/18/22 1608 12/18/22 2151 12/19/22 0756  GLUCAP 111* 147* 95 227* 138*   D-Dimer No results for input(s): "DDIMER" in the last 72 hours. Hgb A1c No results for input(s): "HGBA1C" in the last 72 hours. Lipid Profile No results for input(s): "CHOL", "HDL", "LDLCALC", "TRIG", "CHOLHDL", "LDLDIRECT" in the last 72 hours. Thyroid function studies No results for input(s): "TSH", "T4TOTAL", "T3FREE", "THYROIDAB" in the last 72 hours.  Invalid input(s): "FREET3" Anemia work up No results for input(s): "VITAMINB12", "FOLATE", "FERRITIN", "TIBC", "IRON", "RETICCTPCT" in the last 72 hours. Urinalysis    Component Value Date/Time   COLORURINE AMBER (A) 12/08/2022 1230   APPEARANCEUR CLOUDY (A) 12/08/2022 1230   LABSPEC 1.018 12/08/2022 1230   PHURINE 5.0 12/08/2022 1230   GLUCOSEU NEGATIVE  12/08/2022 1230   HGBUR SMALL (A) 12/08/2022 1230   BILIRUBINUR NEGATIVE 12/08/2022 1230   KETONESUR NEGATIVE 12/08/2022 1230   PROTEINUR NEGATIVE 12/08/2022 1230   NITRITE NEGATIVE 12/08/2022 1230   LEUKOCYTESUR MODERATE (A) 12/08/2022 1230   Sepsis Labs Recent Labs  Lab 12/13/22 0540 12/15/22 0948 12/17/22 0430 12/18/22 0504  WBC 40.6* 24.7* 25.9* 30.7*   Microbiology No results found for this or any previous visit (from the past 240 hour(s)).   Time coordinating discharge: Over 30 minutes  SIGNED:   Azucena Fallen, DO [facility] 12/19/2022, 11:10 AM Pager   If 7PM-7AM, please contact night-coverage [email or web address]

## 2022-12-19 NOTE — TOC Progression Note (Signed)
Transition of Care [facility]) - Progression Note    Patient Details  Name: Erin Hoffman MRN: [id number] Date of Birth: 31-Jan-1945  Transition of Care [facility]) CM/SW Contact  Eduard Roux, [place] Phone Number: 12/19/2022, 10:44 AM  Clinical Narrative:     Received insurance auth approval # [id number] for 7 days  PTAR approval # [id number]  Antony Blackbird, MSW, LCSW Clinical Social Worker    Expected Discharge Plan: IP Rehab Facility Barriers to Discharge: Continued Medical Work up  Expected Discharge Plan and Services                                               Social Determinants of Health (SDOH) Interventions SDOH Screenings   Food Insecurity: No Food Insecurity (12/06/2022)  Housing: Low Risk  (12/06/2022)  Transportation Needs: No Transportation Needs (12/06/2022)  Utilities: Not At Risk (12/06/2022)  Tobacco Use: Medium Risk (12/05/2022)    Readmission Risk Interventions     No data to display

## 2022-12-20 ENCOUNTER — Inpatient Hospital Stay: Payer: HMO

## 2022-12-20 ENCOUNTER — Encounter: Payer: Self-pay | Admitting: *Deleted

## 2022-12-20 DIAGNOSIS — I69154 Hemiplegia and hemiparesis following nontraumatic intracerebral hemorrhage affecting left non-dominant side: Secondary | ICD-10-CM | POA: Diagnosis not present

## 2022-12-20 DIAGNOSIS — C7951 Secondary malignant neoplasm of bone: Secondary | ICD-10-CM | POA: Diagnosis not present

## 2022-12-20 DIAGNOSIS — C221 Intrahepatic bile duct carcinoma: Secondary | ICD-10-CM | POA: Diagnosis not present

## 2022-12-20 DIAGNOSIS — I69192 Facial weakness following nontraumatic intracerebral hemorrhage: Secondary | ICD-10-CM | POA: Diagnosis not present

## 2022-12-20 NOTE — Progress Notes (Signed)
Erin Hoffman @ [address] was contacted by telephone to verify understanding of discharge instructions status post their most recent discharge from the hospital on the date:  12/19/22.  Inpatient discharge AVS was re-reviewed with patient, along with cancer center appointments.  Verification of understanding for oncology specific follow-up was validated using the Teach Back method.  Also spoke with son [name] and confirmed appointments.  States he will be present for next visit  Transportation to appointments were confirmed for the patient as being  Facility transportation .  Erin Hoffman and [name]'s questions were addressed to their satisfaction upon completion of this post discharge follow-up call for outpatient oncology.

## 2022-12-24 ENCOUNTER — Telehealth: Payer: Self-pay | Admitting: Dietician

## 2022-12-24 ENCOUNTER — Inpatient Hospital Stay: Payer: HMO | Admitting: Dietician

## 2022-12-24 NOTE — Telephone Encounter (Signed)
Attempted to contact patient for nutrition follow-up via telephone. Patient currently admitted at [facility] for rehab s/p 11/27-11/14 hospital admission.   Patient did not answer room phone. Left VM with contact information.

## 2022-12-25 ENCOUNTER — Telehealth: Payer: Self-pay | Admitting: *Deleted

## 2022-12-25 DIAGNOSIS — L89153 Pressure ulcer of sacral region, stage 3: Secondary | ICD-10-CM | POA: Diagnosis not present

## 2022-12-26 ENCOUNTER — Telehealth: Payer: Self-pay | Admitting: *Deleted

## 2022-12-26 NOTE — Telephone Encounter (Signed)
Spoke with [place], LSW @ [facility].  They have placed a referral for Palliative care with [facility].  Son [name] is aware and in agreement.  Patient unable to come to next treatment appointment due to decline in status secondary to stroke.  Dr. Ellin Saba made aware.

## 2022-12-27 ENCOUNTER — Other Ambulatory Visit: Payer: Self-pay

## 2022-12-27 DIAGNOSIS — N289 Disorder of kidney and ureter, unspecified: Secondary | ICD-10-CM | POA: Diagnosis not present

## 2022-12-27 DIAGNOSIS — C7951 Secondary malignant neoplasm of bone: Secondary | ICD-10-CM | POA: Diagnosis not present

## 2022-12-31 DIAGNOSIS — C7951 Secondary malignant neoplasm of bone: Secondary | ICD-10-CM | POA: Diagnosis not present

## 2022-12-31 DIAGNOSIS — I482 Chronic atrial fibrillation, unspecified: Secondary | ICD-10-CM | POA: Diagnosis not present

## 2022-12-31 DIAGNOSIS — C221 Intrahepatic bile duct carcinoma: Secondary | ICD-10-CM | POA: Diagnosis not present

## 2022-12-31 DIAGNOSIS — I10 Essential (primary) hypertension: Secondary | ICD-10-CM | POA: Diagnosis not present

## 2023-01-03 ENCOUNTER — Inpatient Hospital Stay: Payer: HMO

## 2023-01-03 ENCOUNTER — Inpatient Hospital Stay: Payer: HMO | Admitting: Hematology

## 2023-01-03 DIAGNOSIS — C221 Intrahepatic bile duct carcinoma: Secondary | ICD-10-CM

## 2023-01-04 ENCOUNTER — Inpatient Hospital Stay: Payer: HMO

## 2023-01-04 DIAGNOSIS — I482 Chronic atrial fibrillation, unspecified: Secondary | ICD-10-CM | POA: Diagnosis not present

## 2023-01-04 DIAGNOSIS — G893 Neoplasm related pain (acute) (chronic): Secondary | ICD-10-CM | POA: Diagnosis not present

## 2023-01-04 DIAGNOSIS — I69152 Hemiplegia and hemiparesis following nontraumatic intracerebral hemorrhage affecting left dominant side: Secondary | ICD-10-CM | POA: Diagnosis not present

## 2023-01-04 DIAGNOSIS — C221 Intrahepatic bile duct carcinoma: Secondary | ICD-10-CM | POA: Diagnosis not present

## 2023-01-04 DIAGNOSIS — R509 Fever, unspecified: Secondary | ICD-10-CM | POA: Diagnosis not present

## 2023-01-04 DIAGNOSIS — Z515 Encounter for palliative care: Secondary | ICD-10-CM | POA: Diagnosis not present

## 2023-01-04 DIAGNOSIS — C249 Malignant neoplasm of biliary tract, unspecified: Secondary | ICD-10-CM | POA: Diagnosis not present

## 2023-01-04 DIAGNOSIS — C7951 Secondary malignant neoplasm of bone: Secondary | ICD-10-CM | POA: Diagnosis not present

## 2023-01-07 DIAGNOSIS — N390 Urinary tract infection, site not specified: Secondary | ICD-10-CM | POA: Diagnosis not present

## 2023-01-07 DIAGNOSIS — C221 Intrahepatic bile duct carcinoma: Secondary | ICD-10-CM | POA: Diagnosis not present

## 2023-01-07 DIAGNOSIS — C7951 Secondary malignant neoplasm of bone: Secondary | ICD-10-CM | POA: Diagnosis not present

## 2023-01-07 DIAGNOSIS — I482 Chronic atrial fibrillation, unspecified: Secondary | ICD-10-CM | POA: Diagnosis not present

## 2023-01-09 ENCOUNTER — Other Ambulatory Visit: Payer: Self-pay

## 2023-01-09 DIAGNOSIS — M6281 Muscle weakness (generalized): Secondary | ICD-10-CM | POA: Diagnosis not present

## 2023-01-09 DIAGNOSIS — G8194 Hemiplegia, unspecified affecting left nondominant side: Secondary | ICD-10-CM | POA: Diagnosis not present

## 2023-01-09 DIAGNOSIS — M6259 Muscle wasting and atrophy, not elsewhere classified, multiple sites: Secondary | ICD-10-CM | POA: Diagnosis not present

## 2023-01-09 DIAGNOSIS — R1311 Dysphagia, oral phase: Secondary | ICD-10-CM | POA: Diagnosis not present

## 2023-01-09 DIAGNOSIS — Z9181 History of falling: Secondary | ICD-10-CM | POA: Diagnosis not present

## 2023-01-09 DIAGNOSIS — I610 Nontraumatic intracerebral hemorrhage in hemisphere, subcortical: Secondary | ICD-10-CM | POA: Diagnosis not present

## 2023-01-10 DIAGNOSIS — K219 Gastro-esophageal reflux disease without esophagitis: Secondary | ICD-10-CM | POA: Diagnosis not present

## 2023-01-10 DIAGNOSIS — I10 Essential (primary) hypertension: Secondary | ICD-10-CM | POA: Diagnosis not present

## 2023-01-10 DIAGNOSIS — I69154 Hemiplegia and hemiparesis following nontraumatic intracerebral hemorrhage affecting left non-dominant side: Secondary | ICD-10-CM | POA: Diagnosis not present

## 2023-01-10 DIAGNOSIS — E119 Type 2 diabetes mellitus without complications: Secondary | ICD-10-CM | POA: Diagnosis not present

## 2023-01-14 DIAGNOSIS — Z9181 History of falling: Secondary | ICD-10-CM | POA: Diagnosis not present

## 2023-01-14 LAB — SURGICAL PATHOLOGY

## 2023-01-15 DIAGNOSIS — L89153 Pressure ulcer of sacral region, stage 3: Secondary | ICD-10-CM | POA: Diagnosis not present

## 2023-01-15 DIAGNOSIS — I482 Chronic atrial fibrillation, unspecified: Secondary | ICD-10-CM | POA: Diagnosis not present

## 2023-01-15 DIAGNOSIS — I69154 Hemiplegia and hemiparesis following nontraumatic intracerebral hemorrhage affecting left non-dominant side: Secondary | ICD-10-CM | POA: Diagnosis not present

## 2023-01-15 DIAGNOSIS — E119 Type 2 diabetes mellitus without complications: Secondary | ICD-10-CM | POA: Diagnosis not present

## 2023-01-15 DIAGNOSIS — D72829 Elevated white blood cell count, unspecified: Secondary | ICD-10-CM | POA: Diagnosis not present

## 2023-01-16 ENCOUNTER — Telehealth: Payer: Self-pay | Admitting: *Deleted

## 2023-01-16 NOTE — Telephone Encounter (Signed)
 Erin Hoffman @ [facility] called and stated per son's request Erin Hoffman) , cx appts for this week. Stated she has declined and he does not think she can tolerate tx right now. I asked about all future appts and Erin Hoffman stated he only requested to cx this week.

## 2023-01-17 ENCOUNTER — Inpatient Hospital Stay: Payer: HMO

## 2023-01-17 ENCOUNTER — Inpatient Hospital Stay: Payer: HMO | Admitting: Hematology

## 2023-01-17 DIAGNOSIS — C221 Intrahepatic bile duct carcinoma: Secondary | ICD-10-CM

## 2023-01-18 ENCOUNTER — Inpatient Hospital Stay: Payer: HMO

## 2023-01-18 ENCOUNTER — Telehealth: Payer: Self-pay | Admitting: Radiation Oncology

## 2023-01-18 NOTE — Telephone Encounter (Signed)
 Called [facility] to schedule a consultation w. Dr. Mitzi Hansen. Call directed to appointment coordinator Jessica, no answer, LVM for a return call.

## 2023-01-21 DIAGNOSIS — I69154 Hemiplegia and hemiparesis following nontraumatic intracerebral hemorrhage affecting left non-dominant side: Secondary | ICD-10-CM | POA: Diagnosis not present

## 2023-01-21 DIAGNOSIS — K521 Toxic gastroenteritis and colitis: Secondary | ICD-10-CM | POA: Diagnosis not present

## 2023-01-21 DIAGNOSIS — E119 Type 2 diabetes mellitus without complications: Secondary | ICD-10-CM | POA: Diagnosis not present

## 2023-01-21 DIAGNOSIS — I10 Essential (primary) hypertension: Secondary | ICD-10-CM | POA: Diagnosis not present

## 2023-01-21 NOTE — Progress Notes (Signed)
 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 A diagnosis of Metastasis to bone [facility]) was also pertinent to this visit.   HISTORY OF PRESENT ILLNESS: Erin Hoffman is a 78 y.o. female seen at the request of Dr. Cheree Cords for cholangiocarcinoma. The patient originally presented in October 2024 with abdominal pain and was found to have a mass in the left hepatic lobe.  Additional workup began and a biopsy on 10/26/2022 confirmed invasive moderately differentiated adenocarcinoma consistent with a intrahepatic cholangiocarcinoma.  A PET scan on 11/08/2022 showed known disease in the left hepatic lobe consistent with her diagnosis of cholangiocarcinoma, and pleural-based disease in the right upper lobe invading the right fourth rib, hypermetabolic activity was also noted in the T9 vertebral body and focal activity in the musculature of the left humeral head region was concerning from metastasis to the muscle.  She began systemic chemotherapy on 11/21/22.  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  She has been unable to resume systemic treatment.  She has developed worsening right posterior chest wall pain due to her known right 4th rib lesion. She's seen today to discuss palliative radiation for pain control. She currently describes severe pain in the upper back, and also lower down at a second area as well. No description of numbness, weakness.   PREVIOUS RADIATION THERAPY: No   PAST MEDICAL HISTORY:  Past Medical History:  Diagnosis Date   Anemia    Anxiety    Diabetes  mellitus    H/O hiatal hernia    Hypertension    Hypothyroidism        PAST SURGICAL HISTORY: Past Surgical History:  Procedure Laterality Date   ABDOMINAL HYSTERECTOMY     CATARACT EXTRACTION W/PHACO Left 06/16/2013   Procedure: CATARACT EXTRACTION PHACO AND INTRAOCULAR LENS PLACEMENT (IOC);  Surgeon: Lavonia Powers T. Gennie Kicks, MD;  Location: [facility] ORS;  Service: Ophthalmology;  Laterality: Left;  CDE:4.65   COLONOSCOPY N/A 10/01/2014   Procedure: COLONOSCOPY;  Surgeon: Ruby Corporal, MD;  Location: [facility] ENDO SUITE;  Service: Endoscopy;  Laterality: N/A;  1125   IR IMAGING GUIDED PORT INSERTION  11/20/2022   TONSILLECTOMY     TUBAL LIGATION       FAMILY HISTORY:  Family History  Problem Relation Age of Onset   Brain cancer Father    Crohn's disease Sister    Coronary artery disease Sister    Multiple myeloma Brother    Diabetes Brother    Hypertension Brother    Rheum arthritis Mother      SOCIAL HISTORY:  reports that she quit smoking about 34 years ago. Her smoking use included cigarettes. She started smoking about 64 years ago. She has a 30 pack-year smoking history. She has never used smokeless tobacco. She reports that she does not drink alcohol and does not use drugs. The patient is divorced and lives in [place].    ALLERGIES: Tape   MEDICATIONS:  Current Outpatient Medications  Medication Sig Dispense Refill   acetaminophen  (TYLENOL ) 500 MG tablet Take 500 mg by mouth every 6 (six) hours as needed  for moderate pain (pain score 4-6).     amLODipine  (NORVASC ) 10 MG tablet Take 1 tablet (10 mg total) by mouth daily. 30 tablet 0   cyanocobalamin  (,VITAMIN B-12,) 1000 MCG/ML injection Inject 1,000 mcg into the muscle every 30 (thirty) days.     docusate sodium  (COLACE) 100 MG capsule Take 100 mg by mouth daily.     enalapril  (VASOTEC ) 10 MG tablet Take 1 tablet (10 mg total) by mouth daily. 30 tablet 0   famotidine  (PEPCID ) 20 MG tablet Take 20 mg by mouth 2 (two) times daily as  needed.     fenofibrate  micronized (LOFIBRA) 134 MG capsule Take 134 mg by mouth daily before breakfast.     fexofenadine (ALLEGRA) 180 MG tablet Take 180 mg by mouth daily as needed for allergies or rhinitis.     glimepiride (AMARYL) 4 MG tablet Take 4 mg by mouth 2 (two) times daily.     insulin  glargine (LANTUS ) 100 UNIT/ML injection Inject 40 Units into the skin at bedtime as needed (high blood sugar).     levothyroxine  (SYNTHROID , LEVOTHROID) 137 MCG tablet Take 137 mcg by mouth daily before breakfast.     lidocaine -prilocaine  (EMLA ) cream Apply to affected area once 30 g 3   magnesium  oxide (MAG-OX) 400 (240 Mg) MG tablet Take 1 tablet (400 mg total) by mouth 2 (two) times daily. 60 tablet 6   metFORMIN (GLUCOPHAGE) 500 MG tablet Take 500 mg by mouth 2 (two) times daily with a meal.     nystatin  (MYCOSTATIN /NYSTOP ) powder Apply topically 3 (three) times daily. 15 g 0   Omega-3 Fatty Acids (FISH OIL) 1200 MG CAPS Take 1 capsule by mouth daily.      pantoprazole  (PROTONIX ) 40 MG tablet Take 1 tablet (40 mg total) by mouth daily. 30 tablet 0   prochlorperazine  (COMPAZINE ) 10 MG tablet Take 1 tablet (10 mg total) by mouth every 6 (six) hours as needed (Nausea or vomiting). 60 tablet 3   rosuvastatin  (CRESTOR ) 20 MG tablet Take 1 tablet (20 mg total) by mouth at bedtime. 30 tablet 0   triamcinolone  cream (KENALOG ) 0.1 % Apply 1 Application topically 2 (two) times daily. (Patient taking differently: Apply 1 Application topically 2 (two) times daily as needed (Rash).) 30 g 0   No current facility-administered medications for this encounter.     REVIEW OF SYSTEMS: A complete review of systems was reviewed with the patient and pertinent positives findings are noted in the history of present illness.      PHYSICAL EXAM:  Wt Readings from Last 3 Encounters:  12/06/22 185 lb 6.5 oz (84.1 kg)  12/04/22 184 lb 4.8 oz (83.6 kg)  11/21/22 181 lb 6.4 oz (82.3 kg)   Temp Readings from Last 3  Encounters:  12/19/22 98.2 F (36.8 C) (Axillary)  12/05/22 98 F (36.7 C) (Oral)  12/04/22 97.6 F (36.4 C) (Oral)   BP Readings from Last 3 Encounters:  12/19/22 139/67  12/05/22 (!) 144/64  12/04/22 (!) 152/65   Pulse Readings from Last 3 Encounters:  12/19/22 (!) 57  12/05/22 (!) 52  12/04/22 61   Pain Assessment Pain Score: 10-Worst pain ever Pain Loc: Back/10  In general this is a female appearing caucasian female in no acute distress. She's alert and oriented x4 and appropriate throughout the examination. Cardiopulmonary assessment is negative for acute distress and she exhibits normal effort.     ECOG = 3  0 - Asymptomatic (Fully active, able to carry on  all predisease activities without restriction)  1 - Symptomatic but completely ambulatory (Restricted in physically strenuous activity but ambulatory and able to carry out work of a light or sedentary nature. For example, light housework, office work)  2 - Symptomatic, <50% in bed during the day (Ambulatory and capable of all self care but unable to carry out any work activities. Up and about more than 50% of waking hours)  3 - Symptomatic, >50% in bed, but not bedbound (Capable of only limited self-care, confined to bed or chair 50% or more of waking hours)  4 - Bedbound (Completely disabled. Cannot carry on any self-care. Totally confined to bed or chair)  5 - Death   Aurea Blossom MM, Creech RH, Tormey DC, et al. [phone]). "Toxicity and response criteria of the [facility]". Am. Hillard Lowes. Oncol. 5 (6): 649-55    LABORATORY DATA:  Lab Results  Component Value Date   WBC 30.7 (H) 12/18/2022   HGB 12.8 12/18/2022   HCT 37.3 12/18/2022   MCV 85.6 12/18/2022   PLT 297 12/18/2022   Lab Results  Component Value Date   NA 135 12/13/2022   K 4.0 12/13/2022   CL 102 12/13/2022   CO2 22 12/13/2022   Lab Results  Component Value Date   ALT 17 12/13/2022   AST 33 12/13/2022   ALKPHOS 190 (H)  12/13/2022   BILITOT 0.7 12/13/2022      RADIOGRAPHY: No results found.     IMPRESSION/PLAN: 1. 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 I've reviewed the patient's pathology findings and the nature of metastatic carcinoma.  I recommend a palliative course of radiation to the right 6th rib/ adjacent pleural disease and also to T9 as a separate isocenter. We discussed the risks, benefits, short, and long term effects of radiotherapy, as well as the palliative intent, and the patient is interested in proceeding.  I reviewed the delivery and logistics of radiotherapy and anticipates a course of 1 week of radiotherapy: 5 fractions.. The patient is scheduled to proceed with simulation tomorrow am.    In a visit lasting 40 minutes through telemedicine, as the patient currently is in rehab, greater than 50% of the time was spent face to face discussing the patient's condition, in preparation for the discussion, and coordinating the patient's care.   ------------------------------------------------  Alix Isaac, MD, PhD    **Disclaimer: This note was dictated with voice recognition software. Similar sounding words can inadvertently be transcribed and this note may contain transcription errors which may not have been corrected upon publication of note.**

## 2023-01-22 DIAGNOSIS — L89153 Pressure ulcer of sacral region, stage 3: Secondary | ICD-10-CM | POA: Diagnosis not present

## 2023-01-22 NOTE — Progress Notes (Addendum)
 Histology and Location of Primary Cancer: Cholangiocarcinoma/Left Hepatic Lobe metastatic to bone- right 4th rib.   Location(s) of Symptomatic Metastases: Right 4th Rib   Past/Anticipated chemotherapy by medical oncology, if any:   Pain on a scale of 0-10 is:  Upper and lower back, right down the middle. Wearing a fentanyl  patch.   Ambulatory status? Walker? Wheelchair?: Ambulatory at baseline. She is currently residing in rehab after her Intracranial Hemorrhage.    SAFETY ISSUES: Prior radiation? No Pacemaker/ICD? No Possible current pregnancy? Hysterectomy Is the patient on methotrexate? No  Current Complaints / other details:   -Living at [facility] ([place])- Camilo Cella transportation coordinator [phone]

## 2023-01-23 ENCOUNTER — Ambulatory Visit
Admission: RE | Admit: 2023-01-23 | Discharge: 2023-01-23 | Disposition: A | Discharge status: Home / Self Care | Payer: HMO | Source: Ambulatory Visit | Attending: Radiation Oncology | Admitting: Radiation Oncology

## 2023-01-23 ENCOUNTER — Encounter: Payer: Self-pay | Admitting: Radiation Oncology

## 2023-01-23 DIAGNOSIS — C240 Malignant neoplasm of extrahepatic bile duct: Secondary | ICD-10-CM | POA: Diagnosis not present

## 2023-01-23 DIAGNOSIS — C221 Intrahepatic bile duct carcinoma: Secondary | ICD-10-CM

## 2023-01-23 DIAGNOSIS — C7951 Secondary malignant neoplasm of bone: Secondary | ICD-10-CM | POA: Insufficient documentation

## 2023-01-24 ENCOUNTER — Ambulatory Visit
Admission: RE | Admit: 2023-01-24 | Discharge: 2023-01-24 | Disposition: A | Discharge status: Home / Self Care | Payer: HMO | Source: Ambulatory Visit | Attending: Radiation Oncology | Admitting: Radiation Oncology

## 2023-01-24 DIAGNOSIS — C221 Intrahepatic bile duct carcinoma: Secondary | ICD-10-CM | POA: Diagnosis not present

## 2023-01-24 DIAGNOSIS — C7951 Secondary malignant neoplasm of bone: Secondary | ICD-10-CM | POA: Diagnosis not present

## 2023-01-24 DIAGNOSIS — Z510 Encounter for antineoplastic radiation therapy: Secondary | ICD-10-CM | POA: Insufficient documentation

## 2023-01-24 DIAGNOSIS — C240 Malignant neoplasm of extrahepatic bile duct: Secondary | ICD-10-CM | POA: Diagnosis not present

## 2023-01-24 NOTE — Addendum Note (Signed)
Encounter addended by: Sondra Come, RN on: 01/24/2023 9:36 AM  Actions taken: Clinical Note Signed

## 2023-01-25 DIAGNOSIS — C7951 Secondary malignant neoplasm of bone: Secondary | ICD-10-CM | POA: Diagnosis not present

## 2023-01-25 DIAGNOSIS — C240 Malignant neoplasm of extrahepatic bile duct: Secondary | ICD-10-CM | POA: Diagnosis not present

## 2023-01-28 ENCOUNTER — Ambulatory Visit
Admission: RE | Admit: 2023-01-28 | Discharge: 2023-01-28 | Disposition: A | Discharge status: Home / Self Care | Payer: HMO | Source: Ambulatory Visit | Attending: Radiation Oncology | Admitting: Radiation Oncology

## 2023-01-28 ENCOUNTER — Other Ambulatory Visit: Payer: Self-pay

## 2023-01-28 DIAGNOSIS — C7951 Secondary malignant neoplasm of bone: Secondary | ICD-10-CM | POA: Diagnosis not present

## 2023-01-28 DIAGNOSIS — C240 Malignant neoplasm of extrahepatic bile duct: Secondary | ICD-10-CM | POA: Diagnosis not present

## 2023-01-28 DIAGNOSIS — Z510 Encounter for antineoplastic radiation therapy: Secondary | ICD-10-CM | POA: Diagnosis not present

## 2023-01-28 LAB — RAD ONC ARIA SESSION SUMMARY
Course Elapsed Days: 0
Plan Fractions Treated to Date: 1
Plan Prescribed Dose Per Fraction: 4 Gy
Plan Total Fractions Prescribed: 5
Plan Total Prescribed Dose: 20 Gy
Reference Point Dosage Given to Date: 4 Gy
Reference Point Session Dosage Given: 4 Gy
Session Number: 1

## 2023-01-29 ENCOUNTER — Telehealth: Payer: Self-pay | Admitting: Radiation Oncology

## 2023-01-29 ENCOUNTER — Ambulatory Visit
Admission: RE | Admit: 2023-01-29 | Discharge: 2023-01-29 | Disposition: A | Discharge status: Home / Self Care | Payer: HMO | Source: Ambulatory Visit | Attending: Radiation Oncology

## 2023-01-29 ENCOUNTER — Other Ambulatory Visit: Payer: Self-pay

## 2023-01-29 DIAGNOSIS — L89153 Pressure ulcer of sacral region, stage 3: Secondary | ICD-10-CM | POA: Diagnosis not present

## 2023-01-29 DIAGNOSIS — C7951 Secondary malignant neoplasm of bone: Secondary | ICD-10-CM | POA: Diagnosis not present

## 2023-01-29 DIAGNOSIS — Z510 Encounter for antineoplastic radiation therapy: Secondary | ICD-10-CM | POA: Diagnosis not present

## 2023-01-29 DIAGNOSIS — C240 Malignant neoplasm of extrahepatic bile duct: Secondary | ICD-10-CM | POA: Diagnosis not present

## 2023-01-29 LAB — RAD ONC ARIA SESSION SUMMARY
Course Elapsed Days: 1
Plan Fractions Treated to Date: 2
Plan Prescribed Dose Per Fraction: 4 Gy
Plan Total Fractions Prescribed: 5
Plan Total Prescribed Dose: 20 Gy
Reference Point Dosage Given to Date: 8 Gy
Reference Point Session Dosage Given: 4 Gy
Session Number: 2

## 2023-01-29 NOTE — Telephone Encounter (Signed)
Call received from pt's son stating transportation was running late but was on the way to pick pt up. They wanted to advise pt is ready and waiting but they wanted to make sure this would not cause tx to be cx. L3 tx team was called and advised of this delay.

## 2023-01-30 ENCOUNTER — Other Ambulatory Visit: Payer: Self-pay

## 2023-01-30 ENCOUNTER — Ambulatory Visit: Payer: HMO

## 2023-01-30 ENCOUNTER — Ambulatory Visit
Admission: RE | Admit: 2023-01-30 | Discharge: 2023-01-30 | Disposition: A | Discharge status: Home / Self Care | Payer: HMO | Source: Ambulatory Visit | Attending: Radiation Oncology | Admitting: Radiation Oncology

## 2023-01-30 DIAGNOSIS — C7951 Secondary malignant neoplasm of bone: Secondary | ICD-10-CM | POA: Diagnosis not present

## 2023-01-30 DIAGNOSIS — C240 Malignant neoplasm of extrahepatic bile duct: Secondary | ICD-10-CM | POA: Diagnosis not present

## 2023-01-30 DIAGNOSIS — Z510 Encounter for antineoplastic radiation therapy: Secondary | ICD-10-CM | POA: Diagnosis not present

## 2023-01-30 LAB — RAD ONC ARIA SESSION SUMMARY
Course Elapsed Days: 2
Plan Fractions Treated to Date: 3
Plan Prescribed Dose Per Fraction: 4 Gy
Plan Total Fractions Prescribed: 5
Plan Total Prescribed Dose: 20 Gy
Reference Point Dosage Given to Date: 12 Gy
Reference Point Session Dosage Given: 4 Gy
Session Number: 3

## 2023-01-30 NOTE — Progress Notes (Incomplete)
[facility] [address] [place] [address]    Clinic Day:  01/30/2023  Referring physician: [name], MD  Patient Care Team: [name], MD as PCP - General (Internal Medicine)   ASSESSMENT & PLAN:   Assessment: 1.  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 - CT CAP (10/22/2022): Heterogeneous mass in the left hepatic lobe, mildly enlarged portacaval node and small lymph nodes in the anterior cardiophrenic fat.  Scattered lung nodules, largest 7 mm. - MRI/MRCP (10/24/2022): Dominant poorly marginated heterogeneous enhancing 4 x 4 x 3.4 cm liver mass centered in the central segment 4 of the left liver with marked intrahepatic biliary ductal dilatation in the left lower lobe.  Numerous similar enhancing satellite liver masses scattered predominantly in the left lower lobe with some involvement of segment 8 right liver and caudate lobe compatible with widespread liver metastasis.  Mildly enlarged porta hepatic lymph node. - Tumor markers: CEA: 39, CA 19-9: 381, AFP: 3.1. - Liver mass biopsy (10/26/2022): Invasive moderately differentiated adenocarcinoma, strongly positive for CK7, focal positivity for CDX2, negative for CK20, negative for hepatic marker arginase.  Site of histomorphology and IHC pattern compatible with pancreaticobiliary origin including cholangiocarcinoma. - PET scan (11/08/2022): Wedge-shaped hypermetabolic mass in the central left hepatic lobe involving central and left hepatic lobe of the liver.  Pleural-based metastasis in the right upper lobe invading the right fourth rib.  Hypermetabolic skeletal metastasis to the T9 vertebral body.  Focal hypermetabolic activity in the muscle posterior to the left humeral head concerning for solitary muscle metastasis. - NGS:  - Gemcitabine, cisplatin and durvalumab  started on 11/21/22   2.  Social/family history: - She lives by herself at home and independent of ADLs and IADLs.  Smoked 1 pack/day for 30 years and quit in the late 1990s. - Brother had multiple myeloma.  Mother had bone cancer and father had brain cancer.    Plan: 1.  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  We discussed side effects in detail. - Reviewed labs today: Normal LFTs and creatinine.  CBC normal.  TSH is 1.9. - She has port placed.  She will proceed with cycle 1 day 1 today with 20% dose reduction for the first cycle.  She will receive pre and post hydration. - RTC 4 weeks for follow-up prior to cycle 2.   2.  Right posterior chest wall pain: - Pain is predominantly on the right back, worse on lying down. - Continue oxycodone twice daily and Tylenol every 4 hours in between.  3.  Hypomagnesemia: - She quit taking magnesium once daily at home on 11/13/2022.  Magnesium is low at 1.4. - Will start her on magnesium 400 mg twice daily.  She will receive IV magnesium.    No orders of the defined types were placed in this encounter.     Alben Deeds Teague,acting as a Neurosurgeon for Doreatha Massed, MD.,have documented all relevant documentation on the behalf of Doreatha Massed, MD,as directed by  Doreatha Massed, MD while in the presence of Doreatha Massed, MD.  ***   Belfry R Teague   1/22/20258:33 AM  CHIEF COMPLAINT:   Diagnosis: metastatic intrahepatic cholangiocarcinoma   Cancer Staging  Cholangiocarcinoma [facility]) Staging form: Intrahepatic Bile Duct, AJCC 8th Edition - Clinical  stage from 11/13/2022: Stage IV (cT2, cN0, cM1) - Signed by Doreatha Massed, MD on 11/13/2022    Prior Therapy: none  Current Therapy:  Gemcitabine, cisplatin and durvalumab    HISTORY OF PRESENT ILLNESS:   Oncology History   Cholangiocarcinoma (HCC)  11/13/2022 Initial Diagnosis   Cholangiocarcinoma (HCC)   11/13/2022 Cancer Staging   Staging form: Intrahepatic Bile Duct, AJCC 8th Edition - Clinical stage from 11/13/2022: Stage IV (cT2, cN0, cM1) - Signed by Doreatha Massed, MD on 11/13/2022 Histopathologic type: Cholangiocarcinoma Stage prefix: Initial diagnosis Histologic grade (G): G2 Histologic grading system: 3 grade system   11/21/2022 -  Chemotherapy   Patient is on Treatment Plan : BILIARY TRACT Cisplatin + Gemcitabine D1,8 + Durvalumab (1500) D1 q21d / Durvalumab (1500) q28d        INTERVAL HISTORY:   Erin Hoffman is a 78 y.o. female presenting to clinic today for follow up of metastatic intrahepatic cholangiocarcinoma. She was last seen by me on 11/21/22.  Since her last visit, she was admitted to the hospital from 12/06/22 to 12/19/22 for subcortical hemorrhage of the right cerebral hemisphere. She was given Andexxa and Cleviprex, then evaluated by neurology at [facility]. She has discontinued Eliquis. She has since been staying in rehab and bears left hemiparesis from stroke.   She also met with Dr. Mitzi Hansen on 01/23/23 to discuss palliative radiation options, beginning treatment on 01/29/23.   Today, she states that she is doing well overall. Her appetite level is at ***%. Her energy level is at ***%.  PAST MEDICAL HISTORY:   Past Medical History: Past Medical History:  Diagnosis Date   Anemia    Anxiety    Diabetes mellitus    H/O hiatal hernia    Hypertension    Hypothyroidism     Surgical History: Past Surgical History:  Procedure Laterality Date   ABDOMINAL HYSTERECTOMY     CATARACT EXTRACTION W/PHACO Left 06/16/2013   Procedure: CATARACT EXTRACTION PHACO AND INTRAOCULAR LENS PLACEMENT (IOC);  Surgeon: [name] T. Nile Riggs, MD;  Location: [facility];  Service: Ophthalmology;  Laterality: Left;  CDE:4.65   COLONOSCOPY N/A 10/01/2014   Procedure: COLONOSCOPY;  Surgeon: Malissa Hippo, MD;   Location: [facility] SUITE;  Service: Endoscopy;  Laterality: N/A;  1125   IR IMAGING GUIDED PORT INSERTION  11/20/2022   TONSILLECTOMY     TUBAL LIGATION      Social History: Social History   Socioeconomic History   Marital status: Divorced    Spouse name: Not on file   Number of children: Not on file   Years of education: Not on file   Highest education level: Not on file  Occupational History   Not on file  Tobacco Use   Smoking status: Former    Current packs/day: 0.00    Average packs/day: 1 pack/day for 30.0 years (30.0 ttl pk-yrs)    Types: Cigarettes    Start date: 06/11/1958    Quit date: 06/10/1988    Years since quitting: 34.6   Smokeless tobacco: Never  Substance and Sexual Activity   Alcohol use: No   Drug use: No   Sexual activity: Yes    Birth control/protection: Surgical  Other Topics Concern   Not on file  Social History Narrative   Not on file   Social Drivers of Health   Financial Resource Strain: Not on file  Food Insecurity: No Food Insecurity (12/06/2022)   Hunger Vital Sign    Worried About Running Out of Food in  the Last Year: Never true    Ran Out of Food in the Last Year: Never true  Transportation Needs: No Transportation Needs (12/06/2022)   PRAPARE - Administrator, Civil Service (Medical): No    Lack of Transportation (Non-Medical): No  Physical Activity: Not on file  Stress: Not on file  Social Connections: Not on file  Intimate Partner Violence: Not At Risk (12/06/2022)   Humiliation, Afraid, Rape, and Kick questionnaire    Fear of Current or Ex-Partner: No    Emotionally Abused: No    Physically Abused: No    Sexually Abused: No    Family History: Family History  Problem Relation Age of Onset   Brain cancer Father    Crohn's disease Sister    Coronary artery disease Sister    Multiple myeloma Brother    Diabetes Brother    Hypertension Brother    Rheum arthritis Mother     Current Medications:  Current  Outpatient Medications:    acetaminophen (TYLENOL) 500 MG tablet, Take 500 mg by mouth every 6 (six) hours as needed for moderate pain (pain score 4-6)., Disp: , Rfl:    amLODipine (NORVASC) 10 MG tablet, Take 1 tablet (10 mg total) by mouth daily., Disp: 30 tablet, Rfl: 0   cyanocobalamin (,VITAMIN B-12,) 1000 MCG/ML injection, Inject 1,000 mcg into the muscle every 30 (thirty) days., Disp: , Rfl:    docusate sodium (COLACE) 100 MG capsule, Take 100 mg by mouth daily., Disp: , Rfl:    enalapril (VASOTEC) 10 MG tablet, Take 1 tablet (10 mg total) by mouth daily., Disp: 30 tablet, Rfl: 0   famotidine (PEPCID) 20 MG tablet, Take 20 mg by mouth 2 (two) times daily as needed., Disp: , Rfl:    fenofibrate micronized (LOFIBRA) 134 MG capsule, Take 134 mg by mouth daily before breakfast., Disp: , Rfl:    fexofenadine (ALLEGRA) 180 MG tablet, Take 180 mg by mouth daily as needed for allergies or rhinitis., Disp: , Rfl:    glimepiride (AMARYL) 4 MG tablet, Take 4 mg by mouth 2 (two) times daily., Disp: , Rfl:    insulin glargine (LANTUS) 100 UNIT/ML injection, Inject 40 Units into the skin at bedtime as needed (high blood sugar)., Disp: , Rfl:    levothyroxine (SYNTHROID, LEVOTHROID) 137 MCG tablet, Take 137 mcg by mouth daily before breakfast., Disp: , Rfl:    lidocaine-prilocaine (EMLA) cream, Apply to affected area once, Disp: 30 g, Rfl: 3   magnesium oxide (MAG-OX) 400 (240 Mg) MG tablet, Take 1 tablet (400 mg total) by mouth 2 (two) times daily., Disp: 60 tablet, Rfl: 6   metFORMIN (GLUCOPHAGE) 500 MG tablet, Take 500 mg by mouth 2 (two) times daily with a meal., Disp: , Rfl:    nystatin (MYCOSTATIN/NYSTOP) powder, Apply topically 3 (three) times daily., Disp: 15 g, Rfl: 0   Omega-3 Fatty Acids (FISH OIL) 1200 MG CAPS, Take 1 capsule by mouth daily. , Disp: , Rfl:    pantoprazole (PROTONIX) 40 MG tablet, Take 1 tablet (40 mg total) by mouth daily., Disp: 30 tablet, Rfl: 0   prochlorperazine (COMPAZINE)  10 MG tablet, Take 1 tablet (10 mg total) by mouth every 6 (six) hours as needed (Nausea or vomiting)., Disp: 60 tablet, Rfl: 3   rosuvastatin (CRESTOR) 20 MG tablet, Take 1 tablet (20 mg total) by mouth at bedtime., Disp: 30 tablet, Rfl: 0   triamcinolone cream (KENALOG) 0.1 %, Apply 1 Application topically 2 (two) times  daily. (Patient taking differently: Apply 1 Application topically 2 (two) times daily as needed (Rash).), Disp: 30 g, Rfl: 0   Allergies: Allergies  Allergen Reactions   Tape Itching and Rash    REVIEW OF SYSTEMS:   Review of Systems  Constitutional:  Negative for chills, fatigue and fever.  HENT:   Negative for lump/mass, mouth sores, nosebleeds, sore throat and trouble swallowing.   Eyes:  Negative for eye problems.  Respiratory:  Negative for cough and shortness of breath.   Cardiovascular:  Negative for chest pain, leg swelling and palpitations.  Gastrointestinal:  Negative for abdominal pain, constipation, diarrhea, nausea and vomiting.  Genitourinary:  Negative for bladder incontinence, difficulty urinating, dysuria, frequency, hematuria and nocturia.   Musculoskeletal:  Negative for arthralgias, back pain, flank pain, myalgias and neck pain.  Skin:  Negative for itching and rash.  Neurological:  Negative for dizziness, headaches and numbness.  Hematological:  Does not bruise/bleed easily.  Psychiatric/Behavioral:  Negative for depression, sleep disturbance and suicidal ideas. The patient is not nervous/anxious.   All other systems reviewed and are negative.    VITALS:   There were no vitals taken for this visit.  Wt Readings from Last 3 Encounters:  12/06/22 185 lb 6.5 oz (84.1 kg)  12/04/22 184 lb 4.8 oz (83.6 kg)  11/21/22 181 lb 6.4 oz (82.3 kg)    There is no height or weight on file to calculate BMI.  Performance status (ECOG): 1 - Symptomatic but completely ambulatory  PHYSICAL EXAM:   Physical Exam Vitals and nursing note reviewed. Exam  conducted with a chaperone present.  Constitutional:      Appearance: Normal appearance.  Cardiovascular:     Rate and Rhythm: Normal rate and regular rhythm.     Pulses: Normal pulses.     Heart sounds: Normal heart sounds.  Pulmonary:     Effort: Pulmonary effort is normal.     Breath sounds: Normal breath sounds.  Abdominal:     Palpations: Abdomen is soft. There is no hepatomegaly, splenomegaly or mass.     Tenderness: There is no abdominal tenderness.  Musculoskeletal:     Right lower leg: No edema.     Left lower leg: No edema.  Lymphadenopathy:     Cervical: No cervical adenopathy.     Right cervical: No superficial, deep or posterior cervical adenopathy.    Left cervical: No superficial, deep or posterior cervical adenopathy.     Upper Body:     Right upper body: No supraclavicular or axillary adenopathy.     Left upper body: No supraclavicular or axillary adenopathy.  Neurological:     General: No focal deficit present.     Mental Status: She is alert and oriented to person, place, and time.  Psychiatric:        Mood and Affect: Mood normal.        Behavior: Behavior normal.     LABS:      Latest Ref Rng & Units 12/18/2022    5:04 AM 12/17/2022    4:30 AM 12/15/2022    9:48 AM  CBC  WBC 4.0 - 10.5 K/uL 30.7  25.9  24.7   Hemoglobin 12.0 - 15.0 g/dL 29.5  62.1  30.8   Hematocrit 36.0 - 46.0 % 37.3  35.2  34.9   Platelets 150 - 400 K/uL 297  227  146       Latest Ref Rng & Units 12/13/2022    5:40 AM 12/10/2022  6:50 AM 12/09/2022    6:14 AM  CMP  Glucose 70 - 99 mg/dL 811  914  782   BUN 8 - 23 mg/dL 22  15  15    Creatinine 0.44 - 1.00 mg/dL 9.56  2.13  0.86   Sodium 135 - 145 mmol/L 135  137  136   Potassium 3.5 - 5.1 mmol/L 4.0  4.1  3.9   Chloride 98 - 111 mmol/L 102  106  104   CO2 22 - 32 mmol/L 22  22  21    Calcium 8.9 - 10.3 mg/dL 8.6  8.7  8.7   Total Protein 6.5 - 8.1 g/dL 5.4     Total Bilirubin <1.2 mg/dL 0.7     Alkaline Phos 38 - 126 U/L  190     AST 15 - 41 U/L 33     ALT 0 - 44 U/L 17        Lab Results  Component Value Date   CEA1 39.0 (H) 10/23/2022   /  CEA  Date Value Ref Range Status  10/23/2022 39.0 (H) 0.0 - 4.7 ng/mL Final    Comment:    (NOTE)                             Nonsmokers          <3.9                             Smokers             <5.6 [employer] Electrochemiluminescence Immunoassay (ECLIA) Values obtained with different assay methods or kits cannot be used interchangeably.  Results cannot be interpreted as absolute evidence of the presence or absence of malignant disease. Performed At: [facility] [address] [place], [place] [id number] Jolene Schimke MD BM:[id number]    No results found for: "PSA1" Lab Results  Component Value Date   CAN199 381 (H) 10/23/2022   No results found for: "CAN125"  No results found for: "TOTALPROTELP", "ALBUMINELP", "A1GS", "A2GS", "BETS", "BETA2SER", "GAMS", "MSPIKE", "SPEI" No results found for: "TIBC", "FERRITIN", "IRONPCTSAT" No results found for: "LDH"   STUDIES:   No results found.

## 2023-01-31 ENCOUNTER — Other Ambulatory Visit: Payer: Self-pay

## 2023-01-31 ENCOUNTER — Inpatient Hospital Stay: Payer: HMO

## 2023-01-31 ENCOUNTER — Inpatient Hospital Stay: Payer: HMO | Admitting: Hematology

## 2023-01-31 ENCOUNTER — Ambulatory Visit: Admission: RE | Admit: 2023-01-31 | Payer: HMO | Source: Ambulatory Visit

## 2023-01-31 DIAGNOSIS — C221 Intrahepatic bile duct carcinoma: Secondary | ICD-10-CM

## 2023-01-31 DIAGNOSIS — C7951 Secondary malignant neoplasm of bone: Secondary | ICD-10-CM | POA: Diagnosis not present

## 2023-02-01 ENCOUNTER — Inpatient Hospital Stay: Payer: HMO

## 2023-02-01 ENCOUNTER — Other Ambulatory Visit: Payer: Self-pay

## 2023-02-01 ENCOUNTER — Ambulatory Visit
Admission: RE | Admit: 2023-02-01 | Discharge: 2023-02-01 | Disposition: A | Discharge status: Home / Self Care | Payer: HMO | Source: Ambulatory Visit | Attending: Radiation Oncology | Admitting: Radiation Oncology

## 2023-02-01 DIAGNOSIS — Z510 Encounter for antineoplastic radiation therapy: Secondary | ICD-10-CM | POA: Diagnosis not present

## 2023-02-01 DIAGNOSIS — C7951 Secondary malignant neoplasm of bone: Secondary | ICD-10-CM | POA: Diagnosis not present

## 2023-02-01 DIAGNOSIS — C240 Malignant neoplasm of extrahepatic bile duct: Secondary | ICD-10-CM | POA: Diagnosis not present

## 2023-02-01 LAB — RAD ONC ARIA SESSION SUMMARY
Course Elapsed Days: 4
Plan Fractions Treated to Date: 4
Plan Prescribed Dose Per Fraction: 4 Gy
Plan Total Fractions Prescribed: 5
Plan Total Prescribed Dose: 20 Gy
Reference Point Dosage Given to Date: 16 Gy
Reference Point Session Dosage Given: 4 Gy
Session Number: 4

## 2023-02-04 ENCOUNTER — Other Ambulatory Visit: Payer: Self-pay

## 2023-02-04 ENCOUNTER — Ambulatory Visit
Admission: RE | Admit: 2023-02-04 | Discharge: 2023-02-04 | Disposition: A | Discharge status: Home / Self Care | Payer: HMO | Source: Ambulatory Visit | Attending: Radiation Oncology | Admitting: Radiation Oncology

## 2023-02-04 DIAGNOSIS — C240 Malignant neoplasm of extrahepatic bile duct: Secondary | ICD-10-CM | POA: Diagnosis not present

## 2023-02-04 DIAGNOSIS — C7951 Secondary malignant neoplasm of bone: Secondary | ICD-10-CM | POA: Diagnosis not present

## 2023-02-04 DIAGNOSIS — Z510 Encounter for antineoplastic radiation therapy: Secondary | ICD-10-CM | POA: Diagnosis not present

## 2023-02-04 LAB — RAD ONC ARIA SESSION SUMMARY
Course Elapsed Days: 7
Plan Fractions Treated to Date: 5
Plan Prescribed Dose Per Fraction: 4 Gy
Plan Total Fractions Prescribed: 5
Plan Total Prescribed Dose: 20 Gy
Reference Point Dosage Given to Date: 20 Gy
Reference Point Session Dosage Given: 4 Gy
Session Number: 5

## 2023-02-05 DIAGNOSIS — L89153 Pressure ulcer of sacral region, stage 3: Secondary | ICD-10-CM | POA: Diagnosis not present

## 2023-02-05 NOTE — Radiation Completion Notes (Signed)
Patient Name: DEMMI, SINDT MRN: [id number] Date of Birth: 05/22/1945 Referring Physician: Doreatha Massed, M.D. Date of Service: 2023-02-05 Radiation Oncologist: Dorothy Puffer, M.D. [facility]                             RADIATION ONCOLOGY END OF TREATMENT NOTE     Diagnosis: C79.51 Secondary malignant neoplasm of bone Staging on 2022-11-13: Cholangiocarcinoma (HCC) T=cT2, N=cN0, M=cM1 Intent: Palliative     ==========DELIVERED PLANS==========  First Treatment Date: 2023-01-28 Last Treatment Date: 2023-02-04   Plan Name: [id number] Site: Thoracic Spine Technique: 3D Mode: Photon Dose Per Fraction: 4 Gy Prescribed Dose (Delivered / Prescribed): 20 Gy / 20 Gy Prescribed Fxs (Delivered / Prescribed): 5 / 5     ==========ON TREATMENT VISIT DATES========== 2023-02-01     ==========UPCOMING VISITS==========       ==========APPENDIX - ON TREATMENT VISIT NOTES==========   See weekly On Treatment Notes in Epic for details in the Media tab (listed as Progress notes on the On Treatment Visit Dates listed above).

## 2023-02-08 ENCOUNTER — Telehealth: Payer: Self-pay | Admitting: Oncology

## 2023-02-08 ENCOUNTER — Telehealth: Payer: Self-pay

## 2023-02-08 ENCOUNTER — Other Ambulatory Visit: Payer: Self-pay

## 2023-02-08 NOTE — Telephone Encounter (Signed)
Patient's son called requesting referral to [facility] for all further care due to mobility issues. Referral made. All future appts here have been cancelled per his request.

## 2023-02-08 NOTE — Telephone Encounter (Signed)
Per referral/chat message called patient to schedule new patient appt. Son did not answer. Left message to call office back for scheduling.

## 2023-02-11 DIAGNOSIS — C7951 Secondary malignant neoplasm of bone: Secondary | ICD-10-CM | POA: Diagnosis not present

## 2023-02-11 DIAGNOSIS — I69154 Hemiplegia and hemiparesis following nontraumatic intracerebral hemorrhage affecting left non-dominant side: Secondary | ICD-10-CM | POA: Diagnosis not present

## 2023-02-11 DIAGNOSIS — C221 Intrahepatic bile duct carcinoma: Secondary | ICD-10-CM | POA: Diagnosis not present

## 2023-02-11 DIAGNOSIS — I482 Chronic atrial fibrillation, unspecified: Secondary | ICD-10-CM | POA: Diagnosis not present

## 2023-02-12 DIAGNOSIS — L89153 Pressure ulcer of sacral region, stage 3: Secondary | ICD-10-CM | POA: Diagnosis not present

## 2023-02-12 DIAGNOSIS — C799 Secondary malignant neoplasm of unspecified site: Secondary | ICD-10-CM | POA: Diagnosis not present

## 2023-02-12 DIAGNOSIS — G893 Neoplasm related pain (acute) (chronic): Secondary | ICD-10-CM | POA: Diagnosis not present

## 2023-02-12 DIAGNOSIS — R410 Disorientation, unspecified: Secondary | ICD-10-CM | POA: Diagnosis not present

## 2023-02-12 DIAGNOSIS — C221 Intrahepatic bile duct carcinoma: Secondary | ICD-10-CM | POA: Diagnosis not present

## 2023-02-13 ENCOUNTER — Inpatient Hospital Stay: Payer: HMO

## 2023-02-13 ENCOUNTER — Inpatient Hospital Stay: Payer: HMO | Admitting: Hematology

## 2023-02-13 DIAGNOSIS — C221 Intrahepatic bile duct carcinoma: Secondary | ICD-10-CM | POA: Diagnosis not present

## 2023-02-13 DIAGNOSIS — K521 Toxic gastroenteritis and colitis: Secondary | ICD-10-CM | POA: Diagnosis not present

## 2023-02-13 DIAGNOSIS — R410 Disorientation, unspecified: Secondary | ICD-10-CM | POA: Diagnosis not present

## 2023-02-13 DIAGNOSIS — K5900 Constipation, unspecified: Secondary | ICD-10-CM | POA: Diagnosis not present

## 2023-02-15 ENCOUNTER — Telehealth: Payer: Self-pay | Admitting: Oncology

## 2023-02-15 ENCOUNTER — Inpatient Hospital Stay (HOSPITAL_BASED_OUTPATIENT_CLINIC_OR_DEPARTMENT_OTHER): Payer: HMO | Admitting: Oncology

## 2023-02-15 ENCOUNTER — Inpatient Hospital Stay: Payer: HMO

## 2023-02-15 VITALS — BP 127/52 | HR 81 | Temp 98.0°F | Resp 15

## 2023-02-15 DIAGNOSIS — Z923 Personal history of irradiation: Secondary | ICD-10-CM | POA: Insufficient documentation

## 2023-02-15 DIAGNOSIS — K30 Functional dyspepsia: Secondary | ICD-10-CM | POA: Insufficient documentation

## 2023-02-15 DIAGNOSIS — C221 Intrahepatic bile duct carcinoma: Secondary | ICD-10-CM

## 2023-02-15 DIAGNOSIS — R0781 Pleurodynia: Secondary | ICD-10-CM | POA: Insufficient documentation

## 2023-02-15 DIAGNOSIS — Z66 Do not resuscitate: Secondary | ICD-10-CM | POA: Insufficient documentation

## 2023-02-15 DIAGNOSIS — E039 Hypothyroidism, unspecified: Secondary | ICD-10-CM | POA: Insufficient documentation

## 2023-02-15 DIAGNOSIS — Z79899 Other long term (current) drug therapy: Secondary | ICD-10-CM | POA: Insufficient documentation

## 2023-02-15 DIAGNOSIS — E119 Type 2 diabetes mellitus without complications: Secondary | ICD-10-CM | POA: Insufficient documentation

## 2023-02-15 DIAGNOSIS — G9389 Other specified disorders of brain: Secondary | ICD-10-CM | POA: Diagnosis not present

## 2023-02-15 DIAGNOSIS — G893 Neoplasm related pain (acute) (chronic): Secondary | ICD-10-CM | POA: Diagnosis not present

## 2023-02-15 DIAGNOSIS — J90 Pleural effusion, not elsewhere classified: Secondary | ICD-10-CM | POA: Diagnosis not present

## 2023-02-15 DIAGNOSIS — M79603 Pain in arm, unspecified: Secondary | ICD-10-CM | POA: Insufficient documentation

## 2023-02-15 DIAGNOSIS — M79606 Pain in leg, unspecified: Secondary | ICD-10-CM | POA: Insufficient documentation

## 2023-02-15 DIAGNOSIS — E11649 Type 2 diabetes mellitus with hypoglycemia without coma: Secondary | ICD-10-CM | POA: Diagnosis not present

## 2023-02-15 DIAGNOSIS — Z8673 Personal history of transient ischemic attack (TIA), and cerebral infarction without residual deficits: Secondary | ICD-10-CM | POA: Insufficient documentation

## 2023-02-15 DIAGNOSIS — M549 Dorsalgia, unspecified: Secondary | ICD-10-CM | POA: Insufficient documentation

## 2023-02-15 DIAGNOSIS — A419 Sepsis, unspecified organism: Secondary | ICD-10-CM | POA: Diagnosis not present

## 2023-02-15 DIAGNOSIS — C782 Secondary malignant neoplasm of pleura: Secondary | ICD-10-CM | POA: Insufficient documentation

## 2023-02-15 DIAGNOSIS — I10 Essential (primary) hypertension: Secondary | ICD-10-CM | POA: Insufficient documentation

## 2023-02-15 DIAGNOSIS — G8194 Hemiplegia, unspecified affecting left nondominant side: Secondary | ICD-10-CM | POA: Insufficient documentation

## 2023-02-15 DIAGNOSIS — C7951 Secondary malignant neoplasm of bone: Secondary | ICD-10-CM

## 2023-02-15 DIAGNOSIS — I639 Cerebral infarction, unspecified: Secondary | ICD-10-CM

## 2023-02-15 DIAGNOSIS — Z452 Encounter for adjustment and management of vascular access device: Secondary | ICD-10-CM | POA: Diagnosis not present

## 2023-02-15 DIAGNOSIS — R4182 Altered mental status, unspecified: Secondary | ICD-10-CM | POA: Diagnosis not present

## 2023-02-15 DIAGNOSIS — R410 Disorientation, unspecified: Secondary | ICD-10-CM | POA: Diagnosis not present

## 2023-02-15 DIAGNOSIS — I672 Cerebral atherosclerosis: Secondary | ICD-10-CM | POA: Diagnosis not present

## 2023-02-15 DIAGNOSIS — Z95828 Presence of other vascular implants and grafts: Secondary | ICD-10-CM | POA: Insufficient documentation

## 2023-02-15 DIAGNOSIS — I6782 Cerebral ischemia: Secondary | ICD-10-CM | POA: Diagnosis not present

## 2023-02-15 LAB — CMP (CANCER CENTER ONLY)
ALT: 6 U/L (ref 0–44)
AST: 18 U/L (ref 15–41)
Albumin: 2.7 g/dL — ABNORMAL LOW (ref 3.5–5.0)
Alkaline Phosphatase: 86 U/L (ref 38–126)
Anion gap: 6 (ref 5–15)
BUN: 17 mg/dL (ref 8–23)
CO2: 26 mmol/L (ref 22–32)
Calcium: 8.7 mg/dL — ABNORMAL LOW (ref 8.9–10.3)
Chloride: 100 mmol/L (ref 98–111)
Creatinine: 0.57 mg/dL (ref 0.44–1.00)
GFR, Estimated: >60 mL/min (ref >60)
Glucose, Bld: 193 mg/dL — ABNORMAL HIGH (ref 70–99)
Potassium: 4.7 mmol/L (ref 3.5–5.1)
Sodium: 132 mmol/L — ABNORMAL LOW (ref 135–145)
Total Bilirubin: 0.8 mg/dL (ref 0.0–1.2)
Total Protein: 5.9 g/dL — ABNORMAL LOW (ref 6.5–8.1)

## 2023-02-15 LAB — CBC WITH DIFFERENTIAL/PLATELET
Abs Immature Granulocytes: 0.05 10*3/uL (ref 0.00–0.07)
Basophils Absolute: 0 10*3/uL (ref 0.0–0.1)
Basophils Relative: 0 %
Eosinophils Absolute: 0 10*3/uL (ref 0.0–0.5)
Eosinophils Relative: 0 %
HCT: 33.6 % — ABNORMAL LOW (ref 36.0–46.0)
Hemoglobin: 11 g/dL — ABNORMAL LOW (ref 12.0–15.0)
Immature Granulocytes: 1 %
Lymphocytes Relative: 9 %
Lymphs Abs: 0.8 10*3/uL (ref 0.7–4.0)
MCH: 29.9 pg (ref 26.0–34.0)
MCHC: 32.7 g/dL (ref 30.0–36.0)
MCV: 91.3 fL (ref 80.0–100.0)
Monocytes Absolute: 0.6 10*3/uL (ref 0.1–1.0)
Monocytes Relative: 6 %
Neutro Abs: 8.3 10*3/uL — ABNORMAL HIGH (ref 1.7–7.7)
Neutrophils Relative %: 84 %
Platelets: 264 10*3/uL (ref 150–400)
RBC: 3.68 MIL/uL — ABNORMAL LOW (ref 3.87–5.11)
RDW: 15.9 % — ABNORMAL HIGH (ref 11.5–15.5)
WBC: 9.9 10*3/uL (ref 4.0–10.5)
nRBC: 0 % (ref 0.0–0.2)

## 2023-02-15 LAB — CEA (ACCESS): CEA (CHCC): 48.87 ng/mL — ABNORMAL HIGH (ref 0.00–5.00)

## 2023-02-15 LAB — TSH: TSH: 1.548 u[IU]/mL (ref 0.350–4.500)

## 2023-02-15 LAB — MAGNESIUM: Magnesium: 1.7 mg/dL (ref 1.7–2.4)

## 2023-02-15 MED ORDER — SODIUM CHLORIDE 0.9% FLUSH
10.0000 mL | Freq: Once | INTRAVENOUS | Status: AC
  Administered 2023-02-15: 10 mL

## 2023-02-15 MED ORDER — HEPARIN SOD (PORK) LOCK FLUSH 100 UNIT/ML IV SOLN
500.0000 [IU] | Freq: Once | INTRAVENOUS | Status: AC
  Administered 2023-02-15: 500 [IU]

## 2023-02-15 NOTE — Progress Notes (Signed)
 [facility]  ONCOLOGY CLINIC PROGRESS NOTE   Patient Care Team: Bertell Satterfield, MD as PCP - General (Internal Medicine)  PATIENT NAME: Erin Hoffman   MR#: [id number] DOB: 11/14/1945  Date of visit: 02/15/2023   ASSESSMENT & PLAN:   Erin Hoffman is a 78 y.o. 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 Rogers, presented to clinic to establish care with us  today, since she is currently living in a rehab facility in [place].  Cholangiocarcinoma metastatic to bone [facility]) Please review oncology history for additional details and timeline of events.  Initial diagnosis and management was at [facility] under the direction of Dr. Rogers.  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  She was eventually started on systemic treatments with gemcitabine , cisplatin  and durvalumab  on 11/21/2022.  She received 1 cycle of systemic chemo immunotherapy.  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  Currently living in a rehab facility.  ECOG performance status is 4.  Today she was accompanied by her son to clinic.  Explained to them that her performance status precludes systemic treatments at this time as risks significantly outweigh benefits.  They verbalized understanding.  Advised to continue rehab and work on improving her strength.  If her performance status improves to ECOG 2, we could resume her systemic treatments, at least with immunotherapy and dose reduced chemotherapy.  To assess current disease status, we will submit request for PET/CT to be done at the end of this month and I will see her in clinic for follow-up and formulating further plan of  care.  If her performance status remains poor, hospice would be the best option for her and this was also discussed today.  CVA (cerebral vascular accident) (HCC) Left-sided weakness post-stroke on November 24th. Currently in a rehab facility with limited mobility, spending 90% of the time in bed. Significant improvement in strength is needed to tolerate further cancer treatment. Emphasized increased physical activity and rehabilitation to improve performance status. - Continue rehabilitation at  [employer] [facility] - Encourage activities to improve strength and mobility - Monitor progress and reassess in four weeks  Cancer associated pain Pain in the leg and arm managed with a fentanyl  patch and oxycodone . Recent adjustments to pain medication regimen at the nursing home. Effective pain management is crucial for quality of life and rehabilitation. - Review and adjust pain management regimen as needed - Ensure fentanyl  patch and oxycodone  are administered appropriately - Monitor pain levels and effectiveness of current regimen    I reviewed lab results and outside records for this visit and discussed relevant results with the patient. Diagnosis, plan of care and treatment options were also discussed in detail with the patient. Opportunity provided to ask questions and answers provided to her apparent satisfaction. Provided instructions to call our clinic with any problems, questions or concerns prior to return visit. I recommended to continue follow-up with PCP and sub-specialists. She verbalized understanding and agreed with the plan.   NCCN guidelines have been consulted in the planning of this patient's care.  I spent a total of 60 minutes during this encounter with the patient including review of chart and various tests results, discussions about plan of care and coordination of care plan.   Chey Rachels,  MD  02/15/2023 10:28 AM  [facility] - A DEPT OF  [facility] [address] [place] [address] Dept: [phone] Dept Fax: [phone]    CHIEF COMPLAINT/ REASON FOR VISIT:   Intrahepatic cholangiocarcinoma metastatic to the bones, stage IV disease.  Current Treatment: She started systemic chemoimmunotherapy with cisplatin , gemcitabine  and durvalumab  on 11/21/2022 under the direction of Dr. Rogers.  Subsequently she was hospitalized with stroke with persistent left hemiplegia.  Currently performance status is ECOG 4.  Systemic treatments on hold.  INTERVAL HISTORY:    Discussed the use of AI scribe software for clinical note transcription with the patient, who gave verbal consent to proceed.   Erin Hoffman is here today for repeat clinical assessment.   The stroke has significantly impacted the patient's physical abilities, with the left side being particularly affected. The patient is currently in a rehabilitation facility, but spends most of the time in bed, with limited mobility. The patient's family member reports that the patient's voice has become weaker.  Prior to the stroke, the patient had undergone a cycle of chemotherapy and immunotherapy, which was well-tolerated. However, the stroke has complicated the patient's condition and the continuation of aggressive cancer treatment. The patient also underwent radiation therapy for pain in the ribs, which has reportedly improved.  The patient's weight loss is noted, although the exact amount is unclear. The patient denies any nausea or vomiting and is able to eat. The patient's pain is managed with a fentanyl  patch and oral oxycodone .  I have reviewed the past medical history, past surgical history, social history and family history with the patient and they are unchanged from previous note.  HISTORY OF PRESENT ILLNESS:   - Presentation to the hospital: Epigastric abdominal pain (indigestion/burping and right sided back pain) for 3  days. - CT CAP (10/22/2022): Heterogeneous mass in the left hepatic lobe, mildly enlarged portacaval node and small lymph nodes in the anterior cardiophrenic fat.  Scattered lung nodules, largest 7 mm. - MRI/MRCP (10/24/2022): Dominant poorly marginated heterogeneous enhancing 4 x 4 x 3.4 cm liver mass centered in the central segment 4 of the left liver with marked intrahepatic biliary ductal dilatation in the left lower lobe.  Numerous similar enhancing satellite liver masses scattered predominantly in the left lower lobe with some involvement of segment 8 right liver and caudate lobe compatible with widespread liver metastasis.  Mildly enlarged porta hepatic lymph node. - Tumor markers: CEA: 39, CA 19-9: 381, AFP: 3.1. - Liver mass biopsy (10/26/2022): Invasive moderately differentiated adenocarcinoma, strongly positive for CK7, focal positivity for CDX2, negative for CK20, negative for hepatic marker arginase.  Site of histomorphology and IHC pattern compatible with pancreaticobiliary origin including cholangiocarcinoma. - PET scan (11/08/2022): Wedge-shaped hypermetabolic mass in the central left hepatic lobe involving central and left hepatic lobe of the liver.  Pleural-based metastasis in the right upper lobe invading the right fourth rib.  Hypermetabolic skeletal metastasis to the T9 vertebral body.  Focal hypermetabolic activity in the muscle posterior to the left humeral head concerning for solitary muscle metastasis. - NGS:  - Gemcitabine , cisplatin  and durvalumab  started on 11/21/22.   Patient was subsequently diagnosed with CVA with persistent left hemiplegia.  Current ECOG performance status 4.  Systemic treatments on hold.  She did receive palliative radiation to the ribs for pain control under the direction of Dr. Dewey.  Oncology History  Cholangiocarcinoma (HCC)  11/13/2022 Initial Diagnosis  Cholangiocarcinoma (HCC)   11/13/2022 Cancer Staging   Staging form: Intrahepatic Bile  Duct, AJCC 8th Edition - Clinical stage from 11/13/2022: Stage IV (cT2, cN0, cM1) - Signed by Rogers Hai, MD on 11/13/2022 Histopathologic type: Cholangiocarcinoma Stage prefix: Initial diagnosis Histologic grade (G): G2 Histologic grading system: 3 grade system   11/21/2022 -  Chemotherapy   Patient is on Treatment Plan : BILIARY TRACT Cisplatin  + Gemcitabine  D1,8 + Durvalumab  (1500) D1 q21d / Durvalumab  (1500) q28d         REVIEW OF SYSTEMS:   Review of Systems - Oncology  All other pertinent systems were reviewed with the patient and are negative.  ALLERGIES: She is allergic to tape.  MEDICATIONS:  Current Outpatient Medications  Medication Sig Dispense Refill   acetaminophen  (TYLENOL ) 500 MG tablet Take 500 mg by mouth every 6 (six) hours as needed for moderate pain (pain score 4-6).     amLODipine  (NORVASC ) 10 MG tablet Take 1 tablet (10 mg total) by mouth daily. 30 tablet 0   cyanocobalamin  (,VITAMIN B-12,) 1000 MCG/ML injection Inject 1,000 mcg into the muscle every 30 (thirty) days.     docusate sodium  (COLACE) 100 MG capsule Take 100 mg by mouth daily.     enalapril  (VASOTEC ) 10 MG tablet Take 1 tablet (10 mg total) by mouth daily. 30 tablet 0   famotidine  (PEPCID ) 20 MG tablet Take 20 mg by mouth 2 (two) times daily as needed.     fenofibrate  micronized (LOFIBRA) 134 MG capsule Take 134 mg by mouth daily before breakfast.     fexofenadine (ALLEGRA) 180 MG tablet Take 180 mg by mouth daily as needed for allergies or rhinitis.     glimepiride (AMARYL) 4 MG tablet Take 4 mg by mouth 2 (two) times daily.     insulin  glargine (LANTUS ) 100 UNIT/ML injection Inject 40 Units into the skin at bedtime as needed (high blood sugar).     levothyroxine  (SYNTHROID , LEVOTHROID) 137 MCG tablet Take 137 mcg by mouth daily before breakfast.     lidocaine -prilocaine  (EMLA ) cream Apply to affected area once 30 g 3   magnesium  oxide (MAG-OX) 400 (240 Mg) MG tablet Take 1 tablet (400  mg total) by mouth 2 (two) times daily. 60 tablet 6   metFORMIN (GLUCOPHAGE) 500 MG tablet Take 500 mg by mouth 2 (two) times daily with a meal.     nystatin  (MYCOSTATIN /NYSTOP ) powder Apply topically 3 (three) times daily. 15 g 0   pantoprazole  (PROTONIX ) 40 MG tablet Take 1 tablet (40 mg total) by mouth daily. 30 tablet 0   prochlorperazine  (COMPAZINE ) 10 MG tablet Take 1 tablet (10 mg total) by mouth every 6 (six) hours as needed (Nausea or vomiting). 60 tablet 3   rosuvastatin  (CRESTOR ) 20 MG tablet Take 1 tablet (20 mg total) by mouth at bedtime. 30 tablet 0   triamcinolone  cream (KENALOG ) 0.1 % Apply 1 Application topically 2 (two) times daily. (Patient taking differently: Apply 1 Application topically 2 (two) times daily as needed (Rash).) 30 g 0   Omega-3 Fatty Acids (FISH OIL) 1200 MG CAPS Take 1 capsule by mouth daily.  (Patient not taking: Reported on 02/15/2023)     No current facility-administered medications for this visit.     VITALS:   Blood pressure (!) 127/52, pulse 81, temperature 98 F (36.7 C), temperature source Temporal, resp. rate 15, SpO2 96%.  Wt Readings from Last 3 Encounters:  12/06/22 185 lb 6.5 oz (84.1 kg)  12/04/22 184 lb  4.8 oz (83.6 kg)  11/21/22 181 lb 6.4 oz (82.3 kg)    There is no height or weight on file to calculate BMI.     Onc Performance Status - 02/15/23 0700       ECOG Perf Status   ECOG Perf Status Completely disabled.  Cannot carry on any selfcare.  Totally confined to bed or chair      KPS SCALE   KPS % SCORE Disabled, requires special care and assistance             PHYSICAL EXAM:   Physical Exam Constitutional:      General: She is not in acute distress.    Appearance: She is ill-appearing.     Comments: Presented to clinic in a wheelchair today.  HENT:     Head: Normocephalic and atraumatic.  Eyes:     General: No scleral icterus.    Conjunctiva/sclera: Conjunctivae normal.  Cardiovascular:     Rate and Rhythm:  Normal rate and regular rhythm.     Heart sounds: Normal heart sounds.  Pulmonary:     Effort: Pulmonary effort is normal.     Breath sounds: Normal breath sounds.  Abdominal:     General: There is no distension.  Neurological:     Mental Status: She is alert and oriented to person, place, and time.     Comments: Left-sided hemiplegia  Psychiatric:        Mood and Affect: Mood normal.        Behavior: Behavior normal.       LABORATORY DATA:   I have reviewed the data as listed.  Results for orders placed or performed in visit on 02/15/23  CMP (Cancer Center only)  Result Value Ref Range   Sodium 132 (L) 135 - 145 mmol/L   Potassium 4.7 3.5 - 5.1 mmol/L   Chloride 100 98 - 111 mmol/L   CO2 26 22 - 32 mmol/L   Glucose, Bld 193 (H) 70 - 99 mg/dL   BUN 17 8 - 23 mg/dL   Creatinine 9.42 9.55 - 1.00 mg/dL   Calcium  8.7 (L) 8.9 - 10.3 mg/dL   Total Protein 5.9 (L) 6.5 - 8.1 g/dL   Albumin 2.7 (L) 3.5 - 5.0 g/dL   AST 18 15 - 41 U/L   ALT 6 0 - 44 U/L   Alkaline Phosphatase 86 38 - 126 U/L   Total Bilirubin 0.8 0.0 - 1.2 mg/dL   GFR, Estimated >39 >39 mL/min   Anion gap 6 5 - 15  Magnesium   Result Value Ref Range   Magnesium  1.7 1.7 - 2.4 mg/dL  CEA (Access)  Result Value Ref Range   CEA ([facility]) 48.87 (H) 0.00 - 5.00 ng/mL  Cancer antigen 19-9  Result Value Ref Range   CA 19-9 269 (H) 0 - 35 U/mL  T4  Result Value Ref Range   T4, Total 11.4 4.5 - 12.0 ug/dL  TSH  Result Value Ref Range   TSH 1.548 0.350 - 4.500 uIU/mL  CBC with Differential/Platelet  Result Value Ref Range   WBC 9.9 4.0 - 10.5 K/uL   RBC 3.68 (L) 3.87 - 5.11 MIL/uL   Hemoglobin 11.0 (L) 12.0 - 15.0 g/dL   HCT 66.3 (L) 63.9 - 53.9 %   MCV 91.3 80.0 - 100.0 fL   MCH 29.9 26.0 - 34.0 pg   MCHC 32.7 30.0 - 36.0 g/dL   RDW 84.0 (H) 88.4 - 84.4 %   Platelets  264 150 - 400 K/uL   nRBC 0.0 0.0 - 0.2 %   Neutrophils Relative % 84 %   Neutro Abs 8.3 (H) 1.7 - 7.7 K/uL   Lymphocytes Relative 9 %    Lymphs Abs 0.8 0.7 - 4.0 K/uL   Monocytes Relative 6 %   Monocytes Absolute 0.6 0.1 - 1.0 K/uL   Eosinophils Relative 0 %   Eosinophils Absolute 0.0 0.0 - 0.5 K/uL   Basophils Relative 0 %   Basophils Absolute 0.0 0.0 - 0.1 K/uL   Immature Granulocytes 1 %   Abs Immature Granulocytes 0.05 0.00 - 0.07 K/uL       RADIOGRAPHIC STUDIES:  I have personally reviewed the radiological images as listed and agree with the findings in the report.   CODE STATUS:  Code Status History     Date Active Date Inactive Code Status Order ID Comments User Context   12/07/2022 0953 12/20/2022 0054 Limited: Do not attempt resuscitation (DNR) -DNR-LIMITED -Do Not Intubate/DNI  [id number]  Esequiel Rosaline GRADE, NP Inpatient   12/06/2022 0251 12/07/2022 0953 Full Code [id number]  Jerrie Lola CROME, MD Inpatient   11/20/2022 1523 11/21/2022 0507 Full Code [id number]  Luverne Aran, MD HOV   10/22/2022 2251 10/27/2022 1711 Full Code [id number]  Zierle-Ghosh, Asia B, DO ED    Questions for Most Recent Historical Code Status (Order [id number])     Question Answer   If pulseless and not breathing No CPR or chest compressions.   In Pre-Arrest Conditions (Patient Is Breathing and Has A Pulse) Do not intubate. Provide all appropriate non-invasive medical interventions. Avoid ICU transfer unless indicated or required.   Consent: Discussion documented in EHR or advanced directives reviewed                Advance Directive Documentation    Flowsheet Row Most Recent Value  Type of Advance Directive Healthcare Power of Attorney, Living will  Pre-existing out of facility DNR order (yellow form or pink MOST form) --  MOST Form in Place? --       Orders Placed This Encounter  Procedures   NM PET Image Restag (PS) Skull Base To Thigh    Standing Status:   Future    Expected Date:   03/07/2023    Expiration Date:   02/15/2024    If indicated for the ordered procedure, I authorize the administration of a  radiopharmaceutical per Radiology protocol:   Yes    Preferred imaging location?:   [place]   CBC with Differential (Cancer Center Only)    Standing Status:   Future    Number of Occurrences:   1    Expiration Date:   02/15/2024   CMP (Cancer Center only)    Standing Status:   Future    Number of Occurrences:   1    Expiration Date:   02/15/2024   Magnesium     Standing Status:   Future    Number of Occurrences:   1    Expiration Date:   02/15/2024   CEA (Access)    Standing Status:   Future    Number of Occurrences:   1    Expiration Date:   02/15/2024   Cancer antigen 19-9    Standing Status:   Future    Number of Occurrences:   1    Expiration Date:   02/15/2024     Future Appointments  Date Time Provider Department Center  03/04/2023  8:45 AM [facility]  [facility] None  03/04/2023  9:30 AM [facility] None      This document was completed utilizing speech recognition software. Grammatical errors, random word insertions, pronoun errors, and incomplete sentences are an occasional consequence of this system due to software limitations, ambient noise, and hardware issues. Any formal questions or concerns about the content, text or information contained within the body of this dictation should be directly addressed to the provider for clarification.

## 2023-02-15 NOTE — Telephone Encounter (Signed)
 Eddie Good (son) called to inform patient will be running a little late; due to transportation arriving late. Eddie Good states on the way. Message pass along to Nurse Autry Legions).

## 2023-02-16 ENCOUNTER — Other Ambulatory Visit: Payer: Self-pay

## 2023-02-16 ENCOUNTER — Inpatient Hospital Stay (HOSPITAL_COMMUNITY)
Admission: EM | Admit: 2023-02-16 | Discharge: 2023-03-01 | DRG: 637 | Disposition: A | Discharge status: Other Acute Inpatient Hospital | Payer: HMO | Source: Other Acute Inpatient Hospital | Attending: Family Medicine | Admitting: Family Medicine

## 2023-02-16 ENCOUNTER — Emergency Department (HOSPITAL_COMMUNITY): Payer: HMO

## 2023-02-16 ENCOUNTER — Encounter (HOSPITAL_COMMUNITY): Payer: Self-pay

## 2023-02-16 DIAGNOSIS — Z1152 Encounter for screening for COVID-19: Secondary | ICD-10-CM

## 2023-02-16 DIAGNOSIS — Z9221 Personal history of antineoplastic chemotherapy: Secondary | ICD-10-CM

## 2023-02-16 DIAGNOSIS — E785 Hyperlipidemia, unspecified: Secondary | ICD-10-CM | POA: Diagnosis present

## 2023-02-16 DIAGNOSIS — J90 Pleural effusion, not elsewhere classified: Secondary | ICD-10-CM | POA: Diagnosis present

## 2023-02-16 DIAGNOSIS — Z87891 Personal history of nicotine dependence: Secondary | ICD-10-CM

## 2023-02-16 DIAGNOSIS — G8929 Other chronic pain: Secondary | ICD-10-CM | POA: Diagnosis present

## 2023-02-16 DIAGNOSIS — E11649 Type 2 diabetes mellitus with hypoglycemia without coma: Principal | ICD-10-CM | POA: Diagnosis present

## 2023-02-16 DIAGNOSIS — Z90710 Acquired absence of both cervix and uterus: Secondary | ICD-10-CM

## 2023-02-16 DIAGNOSIS — E440 Moderate protein-calorie malnutrition: Secondary | ICD-10-CM | POA: Insufficient documentation

## 2023-02-16 DIAGNOSIS — E860 Dehydration: Secondary | ICD-10-CM | POA: Diagnosis present

## 2023-02-16 DIAGNOSIS — E872 Acidosis, unspecified: Secondary | ICD-10-CM | POA: Diagnosis present

## 2023-02-16 DIAGNOSIS — I69154 Hemiplegia and hemiparesis following nontraumatic intracerebral hemorrhage affecting left non-dominant side: Secondary | ICD-10-CM

## 2023-02-16 DIAGNOSIS — G893 Neoplasm related pain (acute) (chronic): Secondary | ICD-10-CM | POA: Diagnosis present

## 2023-02-16 DIAGNOSIS — K219 Gastro-esophageal reflux disease without esophagitis: Secondary | ICD-10-CM | POA: Diagnosis present

## 2023-02-16 DIAGNOSIS — Z993 Dependence on wheelchair: Secondary | ICD-10-CM

## 2023-02-16 DIAGNOSIS — G9341 Metabolic encephalopathy: Secondary | ICD-10-CM | POA: Diagnosis present

## 2023-02-16 DIAGNOSIS — I10 Essential (primary) hypertension: Secondary | ICD-10-CM | POA: Diagnosis present

## 2023-02-16 DIAGNOSIS — I4819 Other persistent atrial fibrillation: Secondary | ICD-10-CM | POA: Diagnosis present

## 2023-02-16 DIAGNOSIS — Z79890 Hormone replacement therapy (postmenopausal): Secondary | ICD-10-CM

## 2023-02-16 DIAGNOSIS — E871 Hypo-osmolality and hyponatremia: Secondary | ICD-10-CM | POA: Diagnosis present

## 2023-02-16 DIAGNOSIS — R609 Edema, unspecified: Secondary | ICD-10-CM | POA: Insufficient documentation

## 2023-02-16 DIAGNOSIS — Z7984 Long term (current) use of oral hypoglycemic drugs: Secondary | ICD-10-CM

## 2023-02-16 DIAGNOSIS — B338 Other specified viral diseases: Secondary | ICD-10-CM

## 2023-02-16 DIAGNOSIS — B974 Respiratory syncytial virus as the cause of diseases classified elsewhere: Secondary | ICD-10-CM | POA: Diagnosis present

## 2023-02-16 DIAGNOSIS — C221 Intrahepatic bile duct carcinoma: Secondary | ICD-10-CM | POA: Diagnosis present

## 2023-02-16 DIAGNOSIS — C7801 Secondary malignant neoplasm of right lung: Secondary | ICD-10-CM | POA: Diagnosis present

## 2023-02-16 DIAGNOSIS — E039 Hypothyroidism, unspecified: Secondary | ICD-10-CM | POA: Diagnosis present

## 2023-02-16 DIAGNOSIS — R600 Localized edema: Secondary | ICD-10-CM

## 2023-02-16 DIAGNOSIS — T383X5A Adverse effect of insulin and oral hypoglycemic [antidiabetic] drugs, initial encounter: Secondary | ICD-10-CM

## 2023-02-16 DIAGNOSIS — A0811 Acute gastroenteropathy due to Norwalk agent: Secondary | ICD-10-CM

## 2023-02-16 DIAGNOSIS — Z6826 Body mass index (BMI) 26.0-26.9, adult: Secondary | ICD-10-CM

## 2023-02-16 DIAGNOSIS — L8990 Pressure ulcer of unspecified site, unspecified stage: Secondary | ICD-10-CM | POA: Insufficient documentation

## 2023-02-16 DIAGNOSIS — Z7401 Bed confinement status: Secondary | ICD-10-CM

## 2023-02-16 DIAGNOSIS — E162 Hypoglycemia, unspecified: Principal | ICD-10-CM

## 2023-02-16 DIAGNOSIS — E16A3 Hypoglycemia level 3: Secondary | ICD-10-CM | POA: Diagnosis present

## 2023-02-16 DIAGNOSIS — R4182 Altered mental status, unspecified: Secondary | ICD-10-CM

## 2023-02-16 DIAGNOSIS — R29810 Facial weakness: Secondary | ICD-10-CM | POA: Diagnosis present

## 2023-02-16 DIAGNOSIS — R410 Disorientation, unspecified: Secondary | ICD-10-CM

## 2023-02-16 DIAGNOSIS — Z794 Long term (current) use of insulin: Secondary | ICD-10-CM

## 2023-02-16 DIAGNOSIS — Z79899 Other long term (current) drug therapy: Secondary | ICD-10-CM

## 2023-02-16 DIAGNOSIS — C7951 Secondary malignant neoplasm of bone: Secondary | ICD-10-CM

## 2023-02-16 DIAGNOSIS — Z923 Personal history of irradiation: Secondary | ICD-10-CM

## 2023-02-16 DIAGNOSIS — Z515 Encounter for palliative care: Secondary | ICD-10-CM

## 2023-02-16 DIAGNOSIS — Z66 Do not resuscitate: Secondary | ICD-10-CM | POA: Diagnosis present

## 2023-02-16 LAB — CBC WITH DIFFERENTIAL/PLATELET
Abs Immature Granulocytes: 0.03 10*3/uL (ref 0.00–0.07)
Basophils Absolute: 0 10*3/uL (ref 0.0–0.1)
Basophils Relative: 0 %
Eosinophils Absolute: 0 10*3/uL (ref 0.0–0.5)
Eosinophils Relative: 0 %
HCT: 30.8 % — ABNORMAL LOW (ref 36.0–46.0)
Hemoglobin: 10.2 g/dL — ABNORMAL LOW (ref 12.0–15.0)
Immature Granulocytes: 0 %
Lymphocytes Relative: 9 %
Lymphs Abs: 1 10*3/uL (ref 0.7–4.0)
MCH: 30.1 pg (ref 26.0–34.0)
MCHC: 33.1 g/dL (ref 30.0–36.0)
MCV: 90.9 fL (ref 80.0–100.0)
Monocytes Absolute: 0.7 10*3/uL (ref 0.1–1.0)
Monocytes Relative: 7 %
Neutro Abs: 8.6 10*3/uL — ABNORMAL HIGH (ref 1.7–7.7)
Neutrophils Relative %: 84 %
Platelets: 250 10*3/uL (ref 150–400)
RBC: 3.39 MIL/uL — ABNORMAL LOW (ref 3.87–5.11)
RDW: 15.9 % — ABNORMAL HIGH (ref 11.5–15.5)
WBC: 10.4 10*3/uL (ref 4.0–10.5)
nRBC: 0 % (ref 0.0–0.2)

## 2023-02-16 LAB — RESP PANEL BY RT-PCR (RSV, FLU A&B, COVID)  RVPGX2
Influenza A by PCR: NEGATIVE
Influenza B by PCR: NEGATIVE
Resp Syncytial Virus by PCR: POSITIVE — AB
SARS Coronavirus 2 by RT PCR: NEGATIVE

## 2023-02-16 LAB — COMPREHENSIVE METABOLIC PANEL
ALT: 10 U/L (ref 0–44)
AST: 22 U/L (ref 15–41)
Albumin: 1.9 g/dL — ABNORMAL LOW (ref 3.5–5.0)
Alkaline Phosphatase: 70 U/L (ref 38–126)
Anion gap: 7 (ref 5–15)
BUN: 20 mg/dL (ref 8–23)
CO2: 23 mmol/L (ref 22–32)
Calcium: 8.5 mg/dL — ABNORMAL LOW (ref 8.9–10.3)
Chloride: 101 mmol/L (ref 98–111)
Creatinine, Ser: 0.66 mg/dL (ref 0.44–1.00)
GFR, Estimated: >60 mL/min (ref >60)
Glucose, Bld: 90 mg/dL (ref 70–99)
Potassium: 4.2 mmol/L (ref 3.5–5.1)
Sodium: 131 mmol/L — ABNORMAL LOW (ref 135–145)
Total Bilirubin: 0.9 mg/dL (ref 0.0–1.2)
Total Protein: 5.3 g/dL — ABNORMAL LOW (ref 6.5–8.1)

## 2023-02-16 LAB — CBG MONITORING, ED
Glucose-Capillary: 89 mg/dL (ref 70–99)
Glucose-Capillary: 73 mg/dL (ref 70–99)
Glucose-Capillary: 42 mg/dL — CL (ref 70–99)
Glucose-Capillary: 95 mg/dL (ref 70–99)

## 2023-02-16 LAB — LIPASE, BLOOD: Lipase: 22 U/L (ref 11–51)

## 2023-02-16 LAB — I-STAT CG4 LACTIC ACID, ED
Lactic Acid, Venous: 1.3 mmol/L (ref 0.5–1.9)
Lactic Acid, Venous: 0.9 mmol/L (ref 0.5–1.9)

## 2023-02-16 LAB — PROTIME-INR
INR: 1.4 — ABNORMAL HIGH (ref 0.8–1.2)
Prothrombin Time: 17.2 s — ABNORMAL HIGH (ref 11.4–15.2)

## 2023-02-16 LAB — APTT: aPTT: 39 s — ABNORMAL HIGH (ref 24–36)

## 2023-02-16 LAB — GLUCOSE, CAPILLARY: Glucose-Capillary: 95 mg/dL (ref 70–99)

## 2023-02-16 MED ORDER — ACETAMINOPHEN 650 MG RE SUPP
650.0000 mg | Freq: Four times a day (QID) | RECTAL | Status: DC | PRN
Start: 2023-02-16 — End: 2023-02-28

## 2023-02-16 MED ORDER — DEXTROSE 50 % IV SOLN
INTRAVENOUS | Status: AC
  Administered 2023-02-16: 50 mL via INTRAVENOUS
  Filled 2023-02-16: qty 50

## 2023-02-16 MED ORDER — OXYCODONE HCL 5 MG PO TABS
5.0000 mg | ORAL_TABLET | Freq: Four times a day (QID) | ORAL | Status: DC | PRN
  Administered 2023-02-18 – 2023-03-01 (×15): 5 mg via ORAL
  Filled 2023-02-16 (×15): qty 1

## 2023-02-16 MED ORDER — SODIUM CHLORIDE 0.9% FLUSH
10.0000 mL | Freq: Two times a day (BID) | INTRAVENOUS | Status: DC
  Administered 2023-02-17 – 2023-02-20 (×5): 10 mL
  Administered 2023-02-20: 20 mL
  Administered 2023-02-21 – 2023-02-26 (×11): 10 mL
  Administered 2023-02-26: 20 mL
  Administered 2023-02-27 – 2023-03-01 (×5): 10 mL

## 2023-02-16 MED ORDER — DEXTROSE IN LACTATED RINGERS 5 % IV SOLN: INTRAVENOUS | Status: DC

## 2023-02-16 MED ORDER — SODIUM CHLORIDE 0.9% FLUSH: 10.0000 mL | INTRAVENOUS | Status: DC | PRN

## 2023-02-16 MED ORDER — SODIUM CHLORIDE 0.9 % IV BOLUS
1000.0000 mL | Freq: Once | INTRAVENOUS | Status: AC
  Administered 2023-02-16: 1000 mL via INTRAVENOUS

## 2023-02-16 MED ORDER — DEXTROSE 50 % IV SOLN: 1.0000 | INTRAVENOUS | Status: DC | PRN

## 2023-02-16 MED ORDER — CHLORHEXIDINE GLUCONATE CLOTH 2 % EX PADS
6.0000 | MEDICATED_PAD | Freq: Every day | CUTANEOUS | Status: DC
  Administered 2023-02-16 – 2023-03-01 (×14): 6 via TOPICAL

## 2023-02-16 MED ORDER — ENOXAPARIN SODIUM 40 MG/0.4ML IJ SOSY
40.0000 mg | PREFILLED_SYRINGE | INTRAMUSCULAR | Status: DC
  Administered 2023-02-16 – 2023-02-28 (×13): 40 mg via SUBCUTANEOUS
  Filled 2023-02-16 (×13): qty 0.4

## 2023-02-16 MED ORDER — FENTANYL 12 MCG/HR TD PT72
1.0000 | MEDICATED_PATCH | TRANSDERMAL | Status: DC
  Administered 2023-02-16 – 2023-02-28 (×5): 1 via TRANSDERMAL
  Filled 2023-02-16 (×5): qty 1

## 2023-02-16 MED ORDER — ACETAMINOPHEN 325 MG PO TABS
650.0000 mg | ORAL_TABLET | Freq: Four times a day (QID) | ORAL | Status: DC | PRN
Start: 2023-02-16 — End: 2023-02-28
  Administered 2023-02-16: 650 mg via ORAL
  Filled 2023-02-16: qty 2

## 2023-02-16 MED ORDER — DEXTROSE 50 % IV SOLN: 50.0000 mL | Freq: Once | INTRAVENOUS | Status: AC

## 2023-02-16 NOTE — Assessment & Plan Note (Addendum)
 Possibly in the setting of having RSV, decreased PO intake, and continuation of prescribed diabetes medications.  Lowest CBG 42.  Previous sugars on CMP ranged from 90-190.  Last A1c 3 months ago at 8.8.  Home regimen includes 36 units of insulin  at bedtime as needed, with sliding scale humalog during the day.  Patient is unable to recall her last meal at this time, however reports she received all her medications yesterday. Per discussion with [facility] it seems she has been consistently getting insulin  despite some hypoglycemic episodes - though this is the first time she has been symptomatic from it. Hypoglycemia has improved upon getting D50, D5 LR, and glucagon.  Latest CBG 95.  - Admit to FMTS, attending Dr. Rosalynn - Med-Surg, Vital signs per floor  - PT/OT to treat - Fluids: D5 LR 50 mL/hr - AM CBC, BMP - Fall precautions - Could consider repeat MRI brain due to previous history of ICH, now off eliquis  for afib - D50 as needed - CBG monitoring - Fall precautions - Will need revision of her diabetes regimen

## 2023-02-16 NOTE — Assessment & Plan Note (Addendum)
 RVP positive for RSV.  Likely exposed at her ALF. - Supportive care - Tylenol  as needed

## 2023-02-16 NOTE — Plan of Care (Signed)
 FMTS Brief Progress Note  S:Seen at bedside on evening rounds. Tells me she feels well. She is aware that she was admitted for hypoglycemia. She does tell me that she thinks she was eating and drinking normally but they tell me I wasn't. Is having back pain that is consistent with her chronic cancer pain.    O: BP (!) 132/52 (BP Location: Right Arm)   Pulse 97   Temp 97.7 F (36.5 C) (Oral)   Resp 18   Ht 5' 9 (1.753 m)   Wt 81.6 kg   SpO2 95%   BMI 26.58 kg/m   Gen: Chronically ill appearing but NAD. Lying on Left side HENT: L facial droop Cardio: Irregularly irregular, normal rate Pulm: normal WOB on RA, lungs clear anteriorly  Ext: RUE with 2+ edema in gravity dependent areas. There is no warmth, redness, or tenderness   A/P: [name] is a 78yo female admitted for altered mental status 2/2 hypoglycemia thought to be due to overuse of insulin  at her ALF.   Hypoglycemia Likely 2/2 over basalalization of her insulin  and decreased PO intake in the setting of RSV infection.  - Has D5 running at 105mL/hr - I have added a 2am CBG check given she likely still has basal insulin  on board and is at risk for recurrent hypoglycemia  RUE Swelling New today. I suspect this is positional due to her lying on her side 2/2 prior stroke, but will obtain a DVT US  to evaluate given she is at increased risk for VTE with her active cancer. Though a positive scan would pose a therapeutic dilemma given her recent ICH (11/2022).   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 On fentanyl  patch and oxycodone . - Her fentanyl  patch and oxycodone  have been re-ordered now that her mental status has returned to baseline   RSV Infection Likely driving poor PO intake. No respiratory symptoms   - Orders reviewed. Labs for AM ordered, which was adjusted as needed.    Erin Lynwood NOVAK, MD 02/16/2023, 7:54 PM PGY-3, [facility] Night Resident  Please page  [phone] with questions.

## 2023-02-16 NOTE — ED Provider Notes (Signed)
 [place] [facility] PROGRESSIVE CARE Provider Note   CSN: [id number] Arrival date & time: 02/16/23  1200     History  Chief Complaint  Patient presents with   Altered Mental Status    Erin Hoffman is a 78 y.o. female.  This is a 78 year old female presenting emergency department for altered mental status.  EMS reported hypoglycemia received glucagon x 2 prior to arrival.  Patient is alert to self and place, but is slow to respond.  Reportedly more alert and oriented.  Does take insulin    Altered Mental Status      Home Medications Prior to Admission medications   Medication Sig Start Date End Date Taking? Authorizing Provider  acetaminophen  (TYLENOL ) 500 MG tablet Take 500 mg by mouth every 6 (six) hours as needed for moderate pain (pain score 4-6).    [provider]  amLODipine  (NORVASC ) 10 MG tablet Take 1 tablet (10 mg total) by mouth daily. 12/20/22   Lue Elsie BROCKS, MD  cyanocobalamin  (,VITAMIN B-12,) 1000 MCG/ML injection Inject 1,000 mcg into the muscle every 30 (thirty) days.    [provider]  docusate sodium  (COLACE) 100 MG capsule Take 100 mg by mouth daily.    [provider]  enalapril  (VASOTEC ) 10 MG tablet Take 1 tablet (10 mg total) by mouth daily. 12/20/22   Lue Elsie BROCKS, MD  famotidine  (PEPCID ) 20 MG tablet Take 20 mg by mouth 2 (two) times daily as needed. 10/17/22   [provider]  fenofibrate  micronized (LOFIBRA) 134 MG capsule Take 134 mg by mouth daily before breakfast.    [provider]  fexofenadine (ALLEGRA) 180 MG tablet Take 180 mg by mouth daily as needed for allergies or rhinitis.    [provider]  glimepiride (AMARYL) 4 MG tablet Take 4 mg by mouth 2 (two) times daily.    [provider]  insulin  glargine (LANTUS ) 100 UNIT/ML injection Inject 40 Units into the skin at bedtime as needed (high blood sugar).    [provider]  levothyroxine  (SYNTHROID ,  LEVOTHROID) 137 MCG tablet Take 137 mcg by mouth daily before breakfast.    [provider]  lidocaine -prilocaine  (EMLA ) cream Apply to affected area once 11/15/22   Rogers Hai, MD  magnesium  oxide (MAG-OX) 400 (240 Mg) MG tablet Take 1 tablet (400 mg total) by mouth 2 (two) times daily. 11/21/22   Katragadda, Sreedhar, MD  metFORMIN (GLUCOPHAGE) 500 MG tablet Take 500 mg by mouth 2 (two) times daily with a meal.    [provider]  nystatin  (MYCOSTATIN /NYSTOP ) powder Apply topically 3 (three) times daily. 12/19/22   Lue Elsie BROCKS, MD  Omega-3 Fatty Acids (FISH OIL) 1200 MG CAPS Take 1 capsule by mouth daily.  Patient not taking: Reported on 02/15/2023    [provider]  pantoprazole  (PROTONIX ) 40 MG tablet Take 1 tablet (40 mg total) by mouth daily. 10/26/22   Akula, Vijaya, MD  prochlorperazine  (COMPAZINE ) 10 MG tablet Take 1 tablet (10 mg total) by mouth every 6 (six) hours as needed (Nausea or vomiting). 11/15/22   Rogers Hai, MD  rosuvastatin  (CRESTOR ) 20 MG tablet Take 1 tablet (20 mg total) by mouth at bedtime. 12/19/22   Lue Elsie BROCKS, MD  triamcinolone  cream (KENALOG ) 0.1 % Apply 1 Application topically 2 (two) times daily. Patient taking differently: Apply 1 Application topically 2 (two) times daily as needed (Rash). 11/28/22   Davonna Siad, MD      Allergies    Tape  Review of Systems   Review of Systems  Physical Exam Updated Vital Signs BP (!) 132/52 (BP Location: Right Arm)   Pulse 97   Temp 97.7 F (36.5 C) (Oral)   Resp 18   Ht 5' 9 (1.753 m)   Wt 81.6 kg   SpO2 95%   BMI 26.58 kg/m  Physical Exam Vitals and nursing note reviewed.  HENT:     Head: Normocephalic.     Nose: Nose normal.  Eyes:     Conjunctiva/sclera: Conjunctivae normal.  Cardiovascular:     Rate and Rhythm: Normal rate and regular rhythm.  Pulmonary:     Effort: Pulmonary effort is normal.  Abdominal:     General: There is no  distension.     Tenderness: There is no abdominal tenderness. There is no guarding or rebound.  Musculoskeletal:     Right lower leg: No edema.     Left lower leg: No edema.  Skin:    General: Skin is warm and dry.  Neurological:     General: No focal deficit present.  Psychiatric:        Mood and Affect: Mood normal.        Behavior: Behavior normal.     ED Results / Procedures / Treatments   Labs (all labs ordered are listed, but only abnormal results are displayed) Labs Reviewed  RESP PANEL BY RT-PCR (RSV, FLU A&B, COVID)  RVPGX2 - Abnormal; Notable for the following components:      Result Value   Resp Syncytial Virus by PCR POSITIVE (*)    All other components within normal limits  COMPREHENSIVE METABOLIC PANEL - Abnormal; Notable for the following components:   Sodium 131 (*)    Calcium  8.5 (*)    Total Protein 5.3 (*)    Albumin 1.9 (*)    All other components within normal limits  CBC WITH DIFFERENTIAL/PLATELET - Abnormal; Notable for the following components:   RBC 3.39 (*)    Hemoglobin 10.2 (*)    HCT 30.8 (*)    RDW 15.9 (*)    Neutro Abs 8.6 (*)    All other components within normal limits  PROTIME-INR - Abnormal; Notable for the following components:   Prothrombin Time 17.2 (*)    INR 1.4 (*)    All other components within normal limits  APTT - Abnormal; Notable for the following components:   aPTT 39 (*)    All other components within normal limits  CBG MONITORING, ED - Abnormal; Notable for the following components:   Glucose-Capillary 42 (*)    All other components within normal limits  CULTURE, BLOOD (ROUTINE X 2)  CULTURE, BLOOD (ROUTINE X 2)  GASTROINTESTINAL PANEL BY PCR, STOOL (REPLACES STOOL CULTURE)  LIPASE, BLOOD  GLUCOSE, CAPILLARY  URINALYSIS, W/ REFLEX TO CULTURE (INFECTION SUSPECTED)  BASIC METABOLIC PANEL  CBC  CBG MONITORING, ED  I-STAT CG4 LACTIC ACID, ED  CBG MONITORING, ED  I-STAT CG4 LACTIC ACID, ED  CBG MONITORING, ED  CBG  MONITORING, ED    EKG EKG Interpretation Date/Time:  Saturday February 16 2023 12:19:51 EST Ventricular Rate:  72 PR Interval:    QRS Duration:  102 QT Interval:  393 QTC Calculation: 431 R Axis:   24  Text Interpretation: Atrial fibrillation Probable anteroseptal infarct, old Confirmed by Neysa Clap [phone]) on 02/16/2023 2:01:39 PM  Radiology CT Head Wo Contrast Result Date: 02/16/2023 CLINICAL DATA:  Mental status change, unknown cause EXAM: CT HEAD WITHOUT CONTRAST TECHNIQUE:  Contiguous axial images were obtained from the base of the skull through the vertex without intravenous contrast. RADIATION DOSE REDUCTION: This exam was performed according to the departmental dose-optimization program which includes automated exposure control, adjustment of the mA and/or kV according to patient size and/or use of iterative reconstruction technique. COMPARISON:  CT head 12/07/2022 and MRI head 12/06/2022 FINDINGS: Brain: No intracranial hemorrhage, mass effect, or evidence of acute infarct. No hydrocephalus. No extra-axial fluid collection. Generalized cerebral atrophy and chronic small vessel ischemic disease. 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 Vascular: No hyperdense vessel. Intracranial arterial calcification. Skull: No fracture or focal lesion. Sinuses/Orbits: No acute finding. Other: None. IMPRESSION: 1. No acute intracranial abnormality. 2. 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 Electronically Signed   By: Norman Gatlin M.D.   On: 02/16/2023 13:45   DG Chest Port 1 View Result Date: 02/16/2023 CLINICAL DATA:  Sepsis EXAM: PORTABLE CHEST 1 VIEW COMPARISON:  12/02/2020 chest radiograph. FINDINGS: Left rotated chest radiograph. Right internal jugular central venous catheter terminates over the cavoatrial junction. Stable cardiomediastinal silhouette with top-normal heart size. No pneumothorax. Trace right pleural  effusion. No left pleural effusion. No pulmonary edema. IMPRESSION: Trace right pleural effusion. No active pulmonary disease. Electronically Signed   By: Selinda DELENA Blue M.D.   On: 02/16/2023 12:53    Procedures Procedures    Medications Ordered in ED Medications  dextrose  5 % in lactated ringers  infusion ( Intravenous New Bag/Given 02/16/23 1500)  enoxaparin  (LOVENOX ) injection 40 mg (has no administration in time range)  acetaminophen  (TYLENOL ) tablet 650 mg (650 mg Oral Given 02/16/23 2045)    Or  acetaminophen  (TYLENOL ) suppository 650 mg ( Rectal See Alternative 02/16/23 2045)  dextrose  50 % solution 50 mL (has no administration in time range)  fentaNYL  (DURAGESIC ) 12 MCG/HR 1 patch (1 patch Transdermal Patch Applied 02/16/23 2021)  oxyCODONE  (Oxy IR/ROXICODONE ) immediate release tablet 5 mg (has no administration in time range)  sodium chloride  0.9 % bolus 1,000 mL (0 mLs Intravenous Stopped 02/16/23 1423)  dextrose  50 % solution 50 mL (50 mLs Intravenous Given 02/16/23 1434)    ED Course/ Medical Decision Making/ A&P Clinical Course as of 02/16/23 2121  Sat Feb 16, 2023  1212 EMS reported hypoglycemia.  Per chart review does appear to take Lantus . [TY]  1218 Had chemo infusion yesterday per chart review: Cisplatin  + Gemcitabine  D1,8 + Durvalumab  (1500) [TY]  1401 Respiratory Syncytial Virus by PCR(!): POSITIVE [TY]  1435 Glucose-Capillary(!!): 42 Given d50 [TY]  1437 Patient has had several glucagon injections prior to arrival. BS continues to drop.  [TY]    Clinical Course User Index [TY] Neysa Caron PARAS, DO                                 Medical Decision Making This is a chronically ill appearing 78 year old female presenting the emergency department for altered mental status.  Hypoglycemic prior to arrival with 2 episodes of glucagon given.  Per chart review does take sulfonylurea as well as insulin .  Blood sugar initially okay here, but down trended.  CT head negative for acute  intracranial pathology.  Slight hyponatremia, but no other significant metabolic derangements.  No leukocytosis to suggest systemic infection.  Minor anemia noted.  She is RSV positive.  Patient became hypoglycemic again here.  Given D50 and started on D5 as symptoms have  seemingly been ongoing since early this morning.  Case discussed with hospitalist who agrees to see and admit patient.  Amount and/or Complexity of Data Reviewed Independent Historian:     Details: EMS reported hypoglycemia and giving glucagon Labs: ordered. Decision-making details documented in ED Course. Radiology: ordered.  Risk Prescription drug management. Decision regarding hospitalization.         Final Clinical Impression(s) / ED Diagnoses Final diagnoses:  Hypoglycemia  Confusion  RSV (respiratory syncytial virus infection)    Rx / DC Orders ED Discharge Orders     None         Neysa Caron PARAS, DO 02/16/23 2121

## 2023-02-16 NOTE — ED Triage Notes (Signed)
 Pt presents to ED via EMS from [facility]. Altered mental status this AM. BG 40 this morning 2 doses of glucagon; Bg increased to 130. Mental status improved slightly per facility. Latest BG 109 with EMS. LKW 1700 yesterday. Unsure of exact normal mentation; no dementia seen in history though. 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   Vitals WNL with EMS

## 2023-02-16 NOTE — ED Notes (Addendum)
 In and out cath performed x 2 with no urine returned. Anatomy verified x 2

## 2023-02-16 NOTE — Hospital Course (Addendum)
 Erin Hoffman is a 78 y.o.female with a history of metastatic cholangiocarcinoma, ICH, T2DM, HTN, hypothyroidism, and A-fib not on Eliquis , who was admitted to the [facility] Teaching [facility] at [facility] for hypoglycemia and AMS. Her hospital course is detailed below:  Hypoglycemia Presented with decreased oral intake likely in the setting of RSV and norovirus, however her CBGs were monitored at her ALF and 36 units of insulin  prn was given based on CBG >140 at her ALF.  Lowest CBG 42.  Patient was treated with D50, D5 LR, and glucagon.  CBGs improved during hospitalization.  Insulin  regimen was titrated to LAI 5 units plus SSI at discharge.  AMS Likely in the setting of hypoglycemia.  CT head negative and no new focal neurological deficits other than her prior deficits from previous ICH (inability to move left arm and left leg).  Back to her baseline confirmed by family at the time of discharge.  RSV  Norovirus Likely exposed at her ALF.  Supportive care provided and Tylenol  as needed.  Oral intake was encouraged as tolerated.  Metastatic Cholangiocarcinoma Follows with [facility] health hematology/oncology Dr. Autumn.  Last seen 02/15/23.  Previously treated with cisplatin , gemcitabine , and durvalumab  in 11/2022.  Uses oxycodone  and fentanyl  patch for pain, which were continued during hospitalization.  Discussed engaging in palliative services with family, who were agreeable.  Palliative consult placed prior to discharge, and they were able to assist with pain control and feeding assistance recommendations.   Other chronic conditions were medically managed with home medications and formulary alternatives as necessary (HTN, hypothyroidism, A-fib, GERD, HLD)  PCP Follow-up Recommendations: Monitor CBGs closely Discontinued BP medications amlodipine  and Enalapril  due to normotension Started Gabapentin  inpatient and discontinued Robaxin.  Follow-up on palliative care referral

## 2023-02-16 NOTE — ED Notes (Signed)
 Oldest son, Erin Hoffman [phone] would like an update asap

## 2023-02-16 NOTE — H&P (Addendum)
 Hospital Admission History and Physical Service Pager: [phone]  Patient name: Erin Hoffman Medical record number: [id number] Date of Birth: 1945-06-16 Age: 78 y.o. Gender: female  Primary Care Provider: Bertell Satterfield, MD Consultants: None Code Status: DNR/DNI Preferred Emergency Contact:  Contact Information     Name Relation Home Work [place] Son [phone]  [phone]   [place], [place] [phone]  [phone]   Chief Complaint: AMS  Assessment and Plan: Erin Hoffman is a 78 y.o. female presenting with AMS and hypoglycemia. Differential for presentation of this includes:  Hypoglycemia Dehydration: Possibly in the setting of RSV and reduced appetite. Medications/Insulin  OD: Possibly in the setting of overuse of insulin  since its ordered as needed nightly based on her sugars along with metformin and glimepiride twice daily.  She reports getting all her medications yesterday evening.  Does not recall her last meal.  AMS No longer altered on exam after receiving D5 LR, D50, and glucagon.  Endocrine: Likely due to hypoglycemia potentially in the setting of overuse of insulin  or dehydration in the setting of RSV. Infection: Tested positive for RSV.  Has remained afebrile with no leukocytosis.  CXR revealed no acute pulmonary findings. Insulin : Possible due to having 40 units of insulin  at bedtime as needed, metformin twice daily and glimepiride twice daily. She believes she was given all three medications yesterday evening. She doesn't recall her last meal, thinks it was this morning.   Alcohol: Unlikely but could not rule out with ethanol level. Acidosis: Lactic acid 1.3. Arrhythmia/Cardiac: EKG demonstrates A-fib with regular rate.  Eliquis  was discontinued during her last hospitalization due to having a intracranial hemorrhage. Electrolytes: Electrolytes within normal limits. Hypoxia/Hypercarbia: Saturating well on room air in the  90s. Overdose/Opiates/Sedating meds: No history of sedating medications. Uremia/Urinary Retention: UA pending. Trauma: No history of trauma. Temperature: Rectal temperature of 99.4.  Positive for RSV and reports a cough.  Thiamine (Wernicke's): Effusion likely secondary to hypoglycemia.  No vision changes.  Has residual left-sided deficits from previous ICH.  No tremors noted on exam, but unable to assess gait. Psychiatric: No known psychiatric history. Stroke: Negative CT head without contrast.  Has residual left-sided deficits from previous ICH Seizure: No seizure-like activity noted. Assessment & Plan Hypoglycemia Possibly in the setting of having RSV, decreased PO intake, and continuation of prescribed diabetes medications.  Lowest CBG 42.  Previous sugars on CMP ranged from 90-190.  Last A1c 3 months ago at 8.8.  Home regimen includes 36 units of insulin  at bedtime as needed, with sliding scale humalog during the day.  Patient is unable to recall her last meal at this time, however reports she received all her medications yesterday. Per discussion with [facility] it seems she has been consistently getting insulin  despite some hypoglycemic episodes - though this is the first time she has been symptomatic from it. Hypoglycemia has improved upon getting D50, D5 LR, and glucagon.  Latest CBG 95.  - Admit to FMTS, attending Dr. Rosalynn - Med-Surg, Vital signs per floor  - PT/OT to treat - Fluids: D5 LR 50 mL/hr - AM CBC, BMP - Fall precautions - Could consider repeat MRI brain due to previous history of ICH, now off eliquis  for afib - D50 as needed - CBG monitoring - Fall precautions - Will need revision of her diabetes regimen  AMS (altered mental status) Mentation has improved at this time.  She is AAOx4.  This likely was in the setting of hypoglycemia upon  arrival possibly due to diabetes regimen in the setting of decreased PO intake - Delirium precautions RSV (respiratory syncytial virus  infection) RVP positive for RSV.  Likely exposed at her ALF. - Supportive care - Tylenol  as needed  Chronic and Stable Problems: Metastatic cholangiocarcinoma: Follows with [facility] health hematology/oncology Dr. Autumn.  Last seen 2/7.  Previously treated with cisplatin , gemcitabine , and durvalumab  in 11/2022.  Uses oxycodone  and fentanyl  patch for pain. Continue fentanyl  patch here to prevent withdrawal. T2DM: Home regimen includes insulin  40 units nightly as needed based on elevated blood glucoses (reportedly greater than 200 per patient), glimepiride 4 mg BID, metformin 500 mg BID HTN: Soft normotensive pressures.  Holding home medications at this time. Hypothyroidism: Continue Synthroid  137 mcg daily when she passes swallow screen. A-fib: Not on Eliquis , therefore higher risk of stroke. CT head negative.  FEN/GI: NPO until she passes swallow screen > carb modified diet VTE Prophylaxis: Lovenox   Disposition: Med-Surg  History of Present Illness:  Erin Hoffman is a 78 y.o. female presenting with altered mental status and hypoglycemia.  Does not remember anything that happened this morning. Able to say she was sent from [place], but does not know why. Reports she has been eating fine - but does not remember her last meal. Has had a cough and some dysuria today, but no urgency or frequency. Denies headache, lightheadedness, dizziness, blurry vision, chest pain, shortness of breath, abdominal pain, N/V/D, or numbness and tingling in her legs.  Reports some swelling in her right hand.  Reports left sided weakness/paralysis from previous ICH. Has previous history of intracranial hemorrhage in 11/2022 for which she had gotten Andexxa  and was started on a Cleviprex  drip for BP control.  She was discontinued off Eliquis  due to bleed.  Recommended repeating MRI in 3 months given small parietal bone lesion likely from her metastatic cholangiocarcinoma.  Called [facility] to discuss what happened  this morning, spoke with supervisor Carlin and nurse Madhu.  Reports that she was staring off and not responding, seemed like she was in shock.  She reports that she did receive Lantus  last night 36 units for a sugar of 340.  Their orders indicate to inject 36 units at bedtime, if her BG is >140.  Reportedly she is not getting Glimepiride, discontinued on 1/31.  Hypoglycemic to 44 and gave glucagon x2, BG went up to 107.  However, patient was not responding clearly and EMS was called.  Had a drop Monday morning as well but was responsive. Denies any falls, seizure-like activity during this time.  At baseline, she is very communicative and can carry-on a conversation.  She is alert and orientated with intermittent confusion.   In the ED, patient presented with altered mental status and hypoglycemia via EMS from ALF.  Vitals demonstrated soft blood pressures with MAPs 55-76.  Rectal temperature 99.4.  Lowest CBG 42.  LA and lipase normal.  CBC with hemoglobin of 10.2 and ANC 8.6.  PT/INR/PTT elevated.  RSV positive.  CXR demonstrated trace right pleural effusion.  CT head without contrast revealed no acute intracranial abnormality.  Patient received 1 L NS bolus, D50, D5 LR 50 mL/hr.  Patient is being admitted for hypoglycemia.  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  Pertinent Past Surgical History: Abdominal hysterectomy and tubal ligation Cataract extraction Tonsillectomy Port insertion Remainder reviewed in history tab.   Pertinent Social History: Tobacco use: Former smoker Alcohol use: No Other Substance use: No [facility] at [facility]  Pertinent Family History: Mother: RA Father: Brain  cancer Sister: Crohn's disease, CAD Brother: Multiple myeloma Brother: T2DM, HTN Remainder reviewed in history tab.   Important Outpatient Medications: ** Called [facility] and reviewed medications ** Tylenol  650 mg BID Amlodipine  10 mg daily Vitamin B12 1000 mcg every 30 days -- not taking Colace 100 mg daily -- not taking MOM 30 mg every day PRN Lisinopril 10 mg daily Pepcid  20 mg BID Fenofibrate  134 mg daily Allegra 180 mg daily PRN Glimepiride 4 mg BID -- discontinued Lantus  36 units at bedtime PRN - which she receives if BG > 140 (consistently getting this) Humalog SSI - < 70 none, 201-250 (2), 251-300 (4), 351-400 (5), 451-500 (6) Synthroid  137 mcg daily Emla  cream Mag oxide 400 mg BID Metformin 500 mg BID - discontinued in January Nystatin  powder TID Pantoprazole  40 mg daily Promethazine  25 mg q6h PRN Prostat 30 mL once daily Oxycodone  5 mg q4h PRN for pain -- last given on 2/6 Fentanyl  patch 12 mcg/hr for 72 hour Kenalog  cream BID Remainder reviewed in medication history.   Objective: BP 122/66   Pulse 88   Temp 98.2 F (36.8 C)   Resp 20   Ht 5' 9 (1.753 m)   Wt 81.6 kg   SpO2 95%   BMI 26.58 kg/m  Exam: General: Awake and Alert in NAD HEENT: NCAT. Sclera anicteric. No rhinorrhea. No oropharyngeal erythema. Cardiovascular: RRR. No M/R/G Respiratory: CTAB, normal WOB on RA. No wheezing, crackles, rhonchi, or diminished breath sounds. Abdomen: Soft, non-tender, non-distended. Bowel sounds normoactive Extremities: Able to move all extremities equally. No BLE edema, no deformities or significant joint findings. Skin: Warm and dry. No abrasions or rashes noted. Neuro: AAOx4 CN II: PERRL CN III, IV,VI: EOMI CV V: Normal sensation in V1, V2, V3 CVII: Symmetric smile and brow raise CN VIII: Normal hearing CN IX,X: Symmetric palate raise  CN XI: 3/5 shoulder shrug R>L, unable to shrug L shoulder CN XII: Symmetric tongue protrusion  UE and LE strength 3/5  on R, unable to move LUE or LLE d/t residual deficits from prior ICH Normal sensation in UE and LE bilaterally   Labs:  CBC BMET  Recent Labs  Lab 02/16/23 1250  WBC 10.4  HGB 10.2*  HCT 30.8*  PLT 250   Recent Labs  Lab 02/16/23 1250  NA 131*  K 4.2  CL 101  CO2 23  BUN 20  CREATININE 0.66  GLUCOSE 90  CALCIUM  8.5*    RSV positive PT/INR: 17.2/1.4 APTT: 39 Lipase: 22 LA: 1.3  EKG: My interpretation: A-fib with rate 72. No ST elevation. No Qtc prolongation. Normal axis.   Imaging Studies Performed:  CXR: Trace right pleural effusion.  No active pulmonary disease.  CT head without contrast: 1. No acute intracranial abnormality. 2. 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  Janna Ferrier, DO 02/16/2023, 6:43 PM PGY-1, [facility]  FPTS Intern pager: [phone], text pages welcome Secure chat group [facility] Teaching Service    FPTS Upper-Level Resident Addendum   I have independently interviewed and examined the patient. I have discussed the above with Dr. Janna and agree with the documented plan. My edits for correction/addition/clarification are  included above. Please see any attending notes.   Payton Coward, MD PGY-2, [facility] 02/16/2023 6:57 PM  FPTS Service pager: [phone] (text pages welcome through [employer])

## 2023-02-16 NOTE — Assessment & Plan Note (Addendum)
 Mentation has improved at this time.  She is AAOx4.  This likely was in the setting of hypoglycemia upon arrival possibly due to diabetes regimen in the setting of decreased PO intake - Delirium precautions

## 2023-02-17 ENCOUNTER — Observation Stay (HOSPITAL_COMMUNITY): Payer: HMO

## 2023-02-17 DIAGNOSIS — E16A3 Hypoglycemia level 3: Secondary | ICD-10-CM | POA: Diagnosis not present

## 2023-02-17 DIAGNOSIS — M7989 Other specified soft tissue disorders: Secondary | ICD-10-CM

## 2023-02-17 DIAGNOSIS — R4182 Altered mental status, unspecified: Secondary | ICD-10-CM | POA: Diagnosis not present

## 2023-02-17 DIAGNOSIS — A419 Sepsis, unspecified organism: Secondary | ICD-10-CM | POA: Diagnosis not present

## 2023-02-17 DIAGNOSIS — Z6826 Body mass index (BMI) 26.0-26.9, adult: Secondary | ICD-10-CM | POA: Diagnosis not present

## 2023-02-17 DIAGNOSIS — I69154 Hemiplegia and hemiparesis following nontraumatic intracerebral hemorrhage affecting left non-dominant side: Secondary | ICD-10-CM | POA: Diagnosis not present

## 2023-02-17 DIAGNOSIS — R293 Abnormal posture: Secondary | ICD-10-CM | POA: Diagnosis not present

## 2023-02-17 DIAGNOSIS — C7951 Secondary malignant neoplasm of bone: Secondary | ICD-10-CM | POA: Diagnosis not present

## 2023-02-17 DIAGNOSIS — G9341 Metabolic encephalopathy: Secondary | ICD-10-CM | POA: Diagnosis not present

## 2023-02-17 DIAGNOSIS — G9389 Other specified disorders of brain: Secondary | ICD-10-CM | POA: Diagnosis not present

## 2023-02-17 DIAGNOSIS — A0811 Acute gastroenteropathy due to Norwalk agent: Secondary | ICD-10-CM | POA: Diagnosis not present

## 2023-02-17 DIAGNOSIS — E440 Moderate protein-calorie malnutrition: Secondary | ICD-10-CM | POA: Diagnosis not present

## 2023-02-17 DIAGNOSIS — E039 Hypothyroidism, unspecified: Secondary | ICD-10-CM | POA: Diagnosis not present

## 2023-02-17 DIAGNOSIS — Z66 Do not resuscitate: Secondary | ICD-10-CM | POA: Diagnosis not present

## 2023-02-17 DIAGNOSIS — E785 Hyperlipidemia, unspecified: Secondary | ICD-10-CM | POA: Diagnosis not present

## 2023-02-17 DIAGNOSIS — B974 Respiratory syncytial virus as the cause of diseases classified elsewhere: Secondary | ICD-10-CM | POA: Diagnosis not present

## 2023-02-17 DIAGNOSIS — B338 Other specified viral diseases: Secondary | ICD-10-CM

## 2023-02-17 DIAGNOSIS — C221 Intrahepatic bile duct carcinoma: Secondary | ICD-10-CM | POA: Diagnosis not present

## 2023-02-17 DIAGNOSIS — Z7189 Other specified counseling: Secondary | ICD-10-CM | POA: Diagnosis not present

## 2023-02-17 DIAGNOSIS — E871 Hypo-osmolality and hyponatremia: Secondary | ICD-10-CM | POA: Diagnosis not present

## 2023-02-17 DIAGNOSIS — E872 Acidosis, unspecified: Secondary | ICD-10-CM | POA: Diagnosis not present

## 2023-02-17 DIAGNOSIS — Z794 Long term (current) use of insulin: Secondary | ICD-10-CM | POA: Diagnosis not present

## 2023-02-17 DIAGNOSIS — I6782 Cerebral ischemia: Secondary | ICD-10-CM | POA: Diagnosis not present

## 2023-02-17 DIAGNOSIS — G893 Neoplasm related pain (acute) (chronic): Secondary | ICD-10-CM | POA: Diagnosis not present

## 2023-02-17 DIAGNOSIS — Z452 Encounter for adjustment and management of vascular access device: Secondary | ICD-10-CM | POA: Diagnosis not present

## 2023-02-17 DIAGNOSIS — Z1152 Encounter for screening for COVID-19: Secondary | ICD-10-CM | POA: Diagnosis not present

## 2023-02-17 DIAGNOSIS — I10 Essential (primary) hypertension: Secondary | ICD-10-CM | POA: Diagnosis not present

## 2023-02-17 DIAGNOSIS — I672 Cerebral atherosclerosis: Secondary | ICD-10-CM | POA: Diagnosis not present

## 2023-02-17 DIAGNOSIS — I610 Nontraumatic intracerebral hemorrhage in hemisphere, subcortical: Secondary | ICD-10-CM | POA: Diagnosis not present

## 2023-02-17 DIAGNOSIS — E162 Hypoglycemia, unspecified: Secondary | ICD-10-CM | POA: Diagnosis not present

## 2023-02-17 DIAGNOSIS — Z515 Encounter for palliative care: Secondary | ICD-10-CM | POA: Diagnosis not present

## 2023-02-17 DIAGNOSIS — E11649 Type 2 diabetes mellitus with hypoglycemia without coma: Secondary | ICD-10-CM | POA: Diagnosis not present

## 2023-02-17 DIAGNOSIS — R600 Localized edema: Secondary | ICD-10-CM

## 2023-02-17 DIAGNOSIS — M6281 Muscle weakness (generalized): Secondary | ICD-10-CM | POA: Diagnosis not present

## 2023-02-17 DIAGNOSIS — Z7401 Bed confinement status: Secondary | ICD-10-CM | POA: Diagnosis not present

## 2023-02-17 DIAGNOSIS — R410 Disorientation, unspecified: Secondary | ICD-10-CM | POA: Diagnosis not present

## 2023-02-17 DIAGNOSIS — K219 Gastro-esophageal reflux disease without esophagitis: Secondary | ICD-10-CM | POA: Diagnosis not present

## 2023-02-17 DIAGNOSIS — E860 Dehydration: Secondary | ICD-10-CM | POA: Diagnosis not present

## 2023-02-17 DIAGNOSIS — I4819 Other persistent atrial fibrillation: Secondary | ICD-10-CM | POA: Diagnosis not present

## 2023-02-17 DIAGNOSIS — J90 Pleural effusion, not elsewhere classified: Secondary | ICD-10-CM | POA: Diagnosis not present

## 2023-02-17 DIAGNOSIS — C7801 Secondary malignant neoplasm of right lung: Secondary | ICD-10-CM | POA: Diagnosis not present

## 2023-02-17 DIAGNOSIS — E161 Other hypoglycemia: Secondary | ICD-10-CM | POA: Diagnosis not present

## 2023-02-17 HISTORY — DX: Acute gastroenteropathy due to Norwalk agent: A08.11

## 2023-02-17 LAB — BLOOD CULTURE ID PANEL (REFLEXED) - BCID2

## 2023-02-17 LAB — GLUCOSE, CAPILLARY
Glucose-Capillary: 146 mg/dL — ABNORMAL HIGH (ref 70–99)
Glucose-Capillary: 145 mg/dL — ABNORMAL HIGH (ref 70–99)
Glucose-Capillary: 196 mg/dL — ABNORMAL HIGH (ref 70–99)
Glucose-Capillary: 207 mg/dL — ABNORMAL HIGH (ref 70–99)

## 2023-02-17 LAB — BASIC METABOLIC PANEL
Anion gap: 11 (ref 5–15)
BUN: 14 mg/dL (ref 8–23)
CO2: 21 mmol/L — ABNORMAL LOW (ref 22–32)
Calcium: 8.5 mg/dL — ABNORMAL LOW (ref 8.9–10.3)
Chloride: 101 mmol/L (ref 98–111)
Creatinine, Ser: 0.56 mg/dL (ref 0.44–1.00)
GFR, Estimated: >60 mL/min (ref >60)
Glucose, Bld: 139 mg/dL — ABNORMAL HIGH (ref 70–99)
Potassium: 4 mmol/L (ref 3.5–5.1)
Sodium: 133 mmol/L — ABNORMAL LOW (ref 135–145)

## 2023-02-17 LAB — GASTROINTESTINAL PANEL BY PCR, STOOL (REPLACES STOOL CULTURE)

## 2023-02-17 LAB — T4: T4, Total: 11.4 ug/dL (ref 4.5–12.0)

## 2023-02-17 LAB — CBC
HCT: 31.1 % — ABNORMAL LOW (ref 36.0–46.0)
Hemoglobin: 10.3 g/dL — ABNORMAL LOW (ref 12.0–15.0)
MCH: 29.7 pg (ref 26.0–34.0)
MCHC: 33.1 g/dL (ref 30.0–36.0)
MCV: 89.6 fL (ref 80.0–100.0)
Platelets: 235 10*3/uL (ref 150–400)
RBC: 3.47 MIL/uL — ABNORMAL LOW (ref 3.87–5.11)
RDW: 16 % — ABNORMAL HIGH (ref 11.5–15.5)
WBC: 8.9 10*3/uL (ref 4.0–10.5)
nRBC: 0 % (ref 0.0–0.2)

## 2023-02-17 LAB — CANCER ANTIGEN 19-9: CA 19-9: 269 U/mL — ABNORMAL HIGH (ref 0–35)

## 2023-02-17 MED ORDER — LEVOTHYROXINE SODIUM 25 MCG PO TABS
137.0000 ug | ORAL_TABLET | Freq: Every day | ORAL | Status: DC
  Administered 2023-02-18 – 2023-03-01 (×12): 137 ug via ORAL
  Filled 2023-02-17 (×12): qty 1

## 2023-02-17 MED ORDER — GABAPENTIN 100 MG PO CAPS: 100.0000 mg | ORAL_CAPSULE | Freq: Every day | ORAL | Status: DC

## 2023-02-17 MED ORDER — GUAIFENESIN ER 600 MG PO TB12
600.0000 mg | ORAL_TABLET | Freq: Two times a day (BID) | ORAL | Status: DC
  Administered 2023-02-17 – 2023-03-01 (×16): 600 mg via ORAL
  Filled 2023-02-17 (×21): qty 1

## 2023-02-17 MED ORDER — GABAPENTIN 100 MG PO CAPS
100.0000 mg | ORAL_CAPSULE | Freq: Two times a day (BID) | ORAL | Status: DC
  Administered 2023-02-17 – 2023-03-01 (×25): 100 mg via ORAL
  Filled 2023-02-17 (×25): qty 1

## 2023-02-17 MED ORDER — PANTOPRAZOLE SODIUM 40 MG PO TBEC
40.0000 mg | DELAYED_RELEASE_TABLET | Freq: Every day | ORAL | Status: DC
  Administered 2023-02-17 – 2023-03-01 (×13): 40 mg via ORAL
  Filled 2023-02-17 (×13): qty 1

## 2023-02-17 MED ORDER — ROSUVASTATIN CALCIUM 20 MG PO TABS
20.0000 mg | ORAL_TABLET | Freq: Every day | ORAL | Status: DC
  Administered 2023-02-17 – 2023-02-28 (×12): 20 mg via ORAL
  Filled 2023-02-17 (×12): qty 1

## 2023-02-17 NOTE — Assessment & Plan Note (Signed)
 GI panel positive  - Encourage PO intake as tolerable

## 2023-02-17 NOTE — Assessment & Plan Note (Addendum)
 Mentation continues to improve. She is AAOx3.  This likely was in the setting of hypoglycemia upon arrival possibly due to diabetes regimen in the setting of decreased PO intake - Delirium precautions - Follow-up UA   - Bcx 1 of 4: Positive Staph Epi, suspect contaminant

## 2023-02-17 NOTE — Progress Notes (Signed)
 Daily Progress Note Intern Pager: [phone]  Patient name: Erin Hoffman Medical record number: [id number] Date of birth: Jul 31, 1945 Age: 78 y.o. Gender: female  Primary Care Provider: Bertell Satterfield, MD Consultants: RD  Code Status: DNR/DNI   Pt Overview and Major Events to Date:  2/8 Admitted   Assessment and Plan:  Erin Hoffman is a 78 y.o. female presenting with AMS and hypoglycemia. . Pertinent PMH/PSH includes metastatic cholangiocarcinoma, T2DM, HTN, hypothyroidism and a-fib.  Assessment & Plan Hypoglycemia On admission Pt w/ decreased PO intake and continuation of prescribed diabetes medications. Home regimen includes 40 units of insulin  at bedtime as needed, with sliding scale humalog during the day.  Per discussion with [facility] it seems she was been consistently getting insulin  despite some hypoglycemic episodes - though this is the first time she has been symptomatic from it. Patient . BS improving this morning 146, patient tolerating breakfast this morning, has limited PO intake at baseline.  - Med-Surg, Vital signs per floor  - PT/OT to treat - Discontinued fluids  - AM CBC, BMP - Fall precautions - Could consider repeat MRI brain due to previous history of ICH, now off eliquis  for afib - CBG monitoring - Fall precautions - Will need revision of her diabetes regimen at discharge  AMS (altered mental status) Mentation continues to improve. She is AAOx3.  This likely was in the setting of hypoglycemia upon arrival possibly due to diabetes regimen in the setting of decreased PO intake - Delirium precautions - Follow-up UA   - Bcx 1 of 4: Positive Staph Epi, suspect contaminant  RSV (respiratory syncytial virus infection) RVP positive for RSV.  Likely exposed at her ALF. - Supportive care - Tylenol  as needed - Mucinex  600 mg BID  Norovirus GI panel positive  - Encourage PO intake as tolerable  Edema of right upper extremity Acute swelling of  RUE. Patient asymptomatic, without any erythema, warmth or pain upon palpation. Suspect edema related to to radiation therapy.  - RUE US  negative for DVT   Chronic Problems:  Metastatic cholangiocarcinoma: Follows with [facility] health hematology/oncology Dr. Autumn.  Last seen 2/7.  Previously treated with cisplatin , gemcitabine , and durvalumab  in 11/2022.  Uses oxycodone  and fentanyl  patch for pain. Continue fentanyl  patch here to prevent withdrawal. Oxycodone  5 mg q6h as needed. Will consider calling family members to discuss engaging with Palliative services.  T2DM: Home regimen includes insulin  40 units nightly as needed based on elevated blood glucoses (reportedly greater than 200 per patient), glimepiride 4 mg BID, metformin 500 mg BID.  HTN: Soft normotensive pressures. Holding home medications at this time. Hypothyroidism: Continue Synthroid  137 mcg daily. A-fib: Not on Eliquis , therefore higher risk of stroke. CT head negative. GERd: Home Protonix  40 mg daily  HLD: Continue Crestor  20 mg daily    FEN/GI: Regular  PPx: Lovenox  Dispo: SNF pending clinical stability    Subjective:  Patient   Objective: Temp:  [97.7 F (36.5 C)-99.4 F (37.4 C)] 98.4 F (36.9 C) (02/09 0335) Pulse Rate:  [68-97] 86 (02/09 0335) Resp:  [16-21] 20 (02/09 0335) BP: (94-133)/(39-66) 133/55 (02/09 0335) SpO2:  [93 %-99 %] 96 % (02/09 0335) Weight:  [81.6 kg] 81.6 kg (02/08 1220) Physical Exam: General: Awake and Alert in NAD HEENT: NCAT. Sclera anicteric. No rhinorrhea. No oropharyngeal erythema. Cardiovascular: RRR. No M/R/G Respiratory: CTAB, normal WOB on RA. No wheezing.  Abdomen: Soft, non-tender, non-distended. Bowel sounds normoactive Extremities: Able to move all extremities equally.  No BLE edema, no deformities or significant joint findings. RUE edema, without erythema, warmth or pain at site.  Skin: Warm and dry. No abrasions or rashes noted.  Laboratory: Most recent CBC Lab Results   Component Value Date   WBC 8.9 02/17/2023   HGB 10.3 (L) 02/17/2023   HCT 31.1 (L) 02/17/2023   MCV 89.6 02/17/2023   PLT 235 02/17/2023   Most recent BMP    Latest Ref Rng & Units 02/17/2023    4:07 AM  BMP  Glucose 70 - 99 mg/dL 860   BUN 8 - 23 mg/dL 14   Creatinine 9.55 - 1.00 mg/dL 9.43   Sodium 864 - 854 mmol/L 133   Potassium 3.5 - 5.1 mmol/L 4.0   Chloride 98 - 111 mmol/L 101   CO2 22 - 32 mmol/L 21   Calcium  8.9 - 10.3 mg/dL 8.5    Other pertinent labs:   GI panel: Noro virus positive   Respiratory: RSV positive   Imaging/Diagnostic Tests:  CT Head w/o contrast:   1. No acute intracranial abnormality. 2. 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  CXR Port 1 view:   Trace right pleural effusion. No active pulmonary disease.    Lenard Calin, MD 02/17/2023, 8:37 AM  PGY-1, [facility] FPTS Intern pager: [phone], text pages welcome Secure chat group [facility] Teaching Service

## 2023-02-17 NOTE — Assessment & Plan Note (Addendum)
 Acute swelling of RUE. Patient asymptomatic, without any erythema, warmth or pain upon palpation. Suspect edema related to to radiation therapy.  - RUE US  negative for DVT   Chronic Problems:  Metastatic cholangiocarcinoma: Follows with [facility] health hematology/oncology Dr. Autumn.  Last seen 2/7.  Previously treated with cisplatin , gemcitabine , and durvalumab  in 11/2022.  Uses oxycodone  and fentanyl  patch for pain. Continue fentanyl  patch here to prevent withdrawal. Oxycodone  5 mg q6h as needed. Will consider calling family members to discuss engaging with Palliative services.  T2DM: Home regimen includes insulin  40 units nightly as needed based on elevated blood glucoses (reportedly greater than 200 per patient), glimepiride 4 mg BID, metformin 500 mg BID.  HTN: Soft normotensive pressures. Holding home medications at this time. Hypothyroidism: Continue Synthroid  137 mcg daily. A-fib: Not on Eliquis , therefore higher risk of stroke. CT head negative. GERd: Home Protonix  40 mg daily  HLD: Continue Crestor  20 mg daily

## 2023-02-17 NOTE — Care Management Obs Status (Signed)
 MEDICARE OBSERVATION STATUS NOTIFICATION   Patient Details  Name: Erin Hoffman MRN: [id number] Date of Birth: 10-24-1945   Medicare Observation Status Notification Given:  Yes    Jannine Meo, RN 02/17/2023, 8:54 AM

## 2023-02-17 NOTE — Assessment & Plan Note (Addendum)
 On admission Pt w/ decreased PO intake and continuation of prescribed diabetes medications. Home regimen includes 40 units of insulin  at bedtime as needed, with sliding scale humalog during the day.  Per discussion with [facility] it seems she was been consistently getting insulin  despite some hypoglycemic episodes - though this is the first time she has been symptomatic from it. Patient . BS improving this morning 146, patient tolerating breakfast this morning, has limited PO intake at baseline.  - Med-Surg, Vital signs per floor  - PT/OT to treat - Discontinued fluids  - AM CBC, BMP - Fall precautions - Could consider repeat MRI brain due to previous history of ICH, now off eliquis  for afib - CBG monitoring - Fall precautions - Will need revision of her diabetes regimen at discharge

## 2023-02-17 NOTE — Progress Notes (Signed)
 Upper extremity venous duplex completed. Please see CV Procedures for preliminary results.  Estanislao Heimlich, RVT 02/17/23 10:38 AM

## 2023-02-17 NOTE — Progress Notes (Signed)
 PHARMACY - PHYSICIAN COMMUNICATION CRITICAL VALUE ALERT - BLOOD CULTURE IDENTIFICATION (BCID)  Erin Hoffman is an 78 y.o. female who presented to [facility] on 02/16/2023 with a chief complaint of dehydration.   Assessment:  1/4 blood cultures positive for Staphylococcus epidermidis, likely contaminant   Name of physician (or Provider) Contacted: Patti Olden   Current antibiotics: none   Changes to prescribed antibiotics recommended:  Recommendations accepted by provider - no antibiotics indicated at this time   No results found for this or any previous visit.  Rankin Sams 02/17/2023  10:03 AM

## 2023-02-17 NOTE — Assessment & Plan Note (Addendum)
 RVP positive for RSV.  Likely exposed at her ALF. - Supportive care - Tylenol  as needed - Mucinex  600 mg BID

## 2023-02-17 NOTE — Plan of Care (Signed)

## 2023-02-18 ENCOUNTER — Encounter: Payer: Self-pay | Admitting: Oncology

## 2023-02-18 ENCOUNTER — Encounter: Payer: Self-pay | Admitting: Hematology

## 2023-02-18 ENCOUNTER — Other Ambulatory Visit: Payer: Self-pay

## 2023-02-18 DIAGNOSIS — B338 Other specified viral diseases: Secondary | ICD-10-CM | POA: Diagnosis not present

## 2023-02-18 DIAGNOSIS — R410 Disorientation, unspecified: Secondary | ICD-10-CM | POA: Diagnosis not present

## 2023-02-18 DIAGNOSIS — E162 Hypoglycemia, unspecified: Secondary | ICD-10-CM | POA: Diagnosis not present

## 2023-02-18 DIAGNOSIS — I639 Cerebral infarction, unspecified: Secondary | ICD-10-CM | POA: Insufficient documentation

## 2023-02-18 DIAGNOSIS — G893 Neoplasm related pain (acute) (chronic): Secondary | ICD-10-CM | POA: Insufficient documentation

## 2023-02-18 LAB — CBC
HCT: 29.4 % — ABNORMAL LOW (ref 36.0–46.0)
Hemoglobin: 9.8 g/dL — ABNORMAL LOW (ref 12.0–15.0)
MCH: 30.6 pg (ref 26.0–34.0)
MCHC: 33.3 g/dL (ref 30.0–36.0)
MCV: 91.9 fL (ref 80.0–100.0)
Platelets: 229 10*3/uL (ref 150–400)
RBC: 3.2 MIL/uL — ABNORMAL LOW (ref 3.87–5.11)
RDW: 16.2 % — ABNORMAL HIGH (ref 11.5–15.5)
WBC: 9.1 10*3/uL (ref 4.0–10.5)
nRBC: 0 % (ref 0.0–0.2)

## 2023-02-18 LAB — BASIC METABOLIC PANEL
Anion gap: 8 (ref 5–15)
BUN: 20 mg/dL (ref 8–23)
CO2: 23 mmol/L (ref 22–32)
Calcium: 8.4 mg/dL — ABNORMAL LOW (ref 8.9–10.3)
Chloride: 102 mmol/L (ref 98–111)
Creatinine, Ser: 0.68 mg/dL (ref 0.44–1.00)
GFR, Estimated: >60 mL/min (ref >60)
Glucose, Bld: 182 mg/dL — ABNORMAL HIGH (ref 70–99)
Potassium: 4.1 mmol/L (ref 3.5–5.1)
Sodium: 133 mmol/L — ABNORMAL LOW (ref 135–145)

## 2023-02-18 LAB — GLUCOSE, CAPILLARY: Glucose-Capillary: 180 mg/dL — ABNORMAL HIGH (ref 70–99)

## 2023-02-18 MED ORDER — GUAIFENESIN-DM 100-10 MG/5ML PO SYRP
5.0000 mL | ORAL_SOLUTION | ORAL | Status: DC | PRN
  Administered 2023-02-18 – 2023-03-01 (×11): 5 mL via ORAL
  Filled 2023-02-18 (×11): qty 5

## 2023-02-18 MED ORDER — GLUCERNA SHAKE PO LIQD
237.0000 mL | Freq: Two times a day (BID) | ORAL | Status: DC
Start: 2023-02-19 — End: 2023-03-02
  Administered 2023-02-19 – 2023-03-01 (×16): 237 mL via ORAL

## 2023-02-18 MED ORDER — ADULT MULTIVITAMIN W/MINERALS CH
1.0000 | ORAL_TABLET | Freq: Every day | ORAL | Status: DC
  Administered 2023-02-19 – 2023-03-01 (×11): 1 via ORAL
  Filled 2023-02-18 (×11): qty 1

## 2023-02-18 NOTE — Assessment & Plan Note (Signed)
 Pain in the leg and arm managed with a fentanyl  patch and oxycodone . Recent adjustments to pain medication regimen at the nursing home. Effective pain management is crucial for quality of life and rehabilitation. - Review and adjust pain management regimen as needed - Ensure fentanyl  patch and oxycodone  are administered appropriately - Monitor pain levels and effectiveness of current regimen

## 2023-02-18 NOTE — Progress Notes (Signed)
 Initial Nutrition Assessment  DOCUMENTATION CODES:   Non-severe (moderate) malnutrition in context of acute illness/injury  INTERVENTION:  Calorie Count - 48 hours - to assess adequacy of intake Glucerna Shake po BID, each supplement provides 220 kcal and 10 grams of protein  MVI w/ minerals  NUTRITION DIAGNOSIS:  Moderate Malnutrition related to acute illness as evidenced by mild fat depletion, moderate muscle depletion, edema.  GOAL:  Patient will meet greater than or equal to 90% of their needs  MONITOR:  PO intake, Supplement acceptance, Labs, Weight trends  REASON FOR ASSESSMENT:  Consult Assessment of nutrition requirement/status, Calorie Count  ASSESSMENT:   Erin Hoffman is 78y.o female admitted for AMS/hypoglycemic event. PMH: metastatic cholangiocarcinoma, T2DM, HTN, Afib, hypothyroidism.  Met with patient and an acquaintance at bedside. She reports appetite has not been desirable for a few weeks at her assisted living facility. Reported no appetite and feelings of increased pressure in her abdomen.   Appetite improving with documented intake of 63% x2 documented meals today. She is open to chocolate Glucerna, but not Ensure. Reports her son brings her Premier Protein shakes to the facility to supplement intake.   Endorses diarrhea PTA and found to be positive for norovirus on admission. Unable to give a dietary recall, but does state that she is supplied three meals per day at her nursing facility that are very similar to the balance/quality of the hospital meals she is currently receiving. No recent difficulties with chewing or swallowing or N/V.   Admit Weight: 81.6kg  Endorses UBW around 82kg. Per chart review, she was 82.4kg three months ago.    Intake/Output Summary (Last 24 hours) at 02/18/2023 2154 Last data filed at 02/18/2023 1500 Gross per 24 hour  Intake 515 ml  Output 575 ml  Net -60 ml   Continues with some mild edema to BUEs. Sodium remains marginally low.  Remains on Dysphagia 3, regular diet. Blood sugars have improved/stabilized.  Discussed the need for insulin  regimen modification in the future should appetite decline again.   Meds: fentanyl  patch, levothyroxine , pantoprazole   Labs: Na+ 131>133>133 (L) K+ 4.1 (wdl) Mg 1.7 (wdl) CBGs 139-182 over 48 hours A1c 8.8 (10/2022)  NUTRITION - FOCUSED PHYSICAL EXAM:  Flowsheet Row Most Recent Value  Orbital Region Mild depletion  Upper Arm Region Mild depletion  Thoracic and Lumbar Region Mild depletion  Buccal Region Moderate depletion  Temple Region Moderate depletion  Clavicle Bone Region Moderate depletion  Clavicle and Acromion Bone Region Moderate depletion  Scapular Bone Region Moderate depletion  Dorsal Hand No depletion  [unable to assess L hand d/t edema]  Patellar Region No depletion  Anterior Thigh Region Mild depletion  Posterior Calf Region Moderate depletion  Edema (RD Assessment) Mild  Hair Reviewed  Eyes Reviewed  Mouth Reviewed  Skin Reviewed  Nails Reviewed    Diet Order:   Diet Order             DIET DYS 3 Room service appropriate? Yes; Fluid consistency: Thin  Diet effective now             EDUCATION NEEDS:   Education needs have been addressed  Skin:  Skin Assessment: Reviewed RN Assessment  Last BM:  2/10 - type 6  Height:  Ht Readings from Last 1 Encounters:  02/16/23 5\' 9"  (1.753 m)   Weight:  Wt Readings from Last 1 Encounters:  02/16/23 81.6 kg    Ideal Body Weight:  65.9 kg  BMI:  Body mass index is 26.58  kg/m.  Estimated Nutritional Needs:   Kcal:  1700-1900kcal  Protein:  75-85g  Fluid:  >1.7L/day  Con Decant MS, RD, LDN Registered Dietitian Clinical Nutrition RD Inpatient Contact Info in Amion

## 2023-02-18 NOTE — Assessment & Plan Note (Signed)
 On admission pt presented w/ decreased PO intake and continuation of insulin  at bedtime when BG > 140 nightly.  Home regimen includes 36 units of insulin  at bedtime as needed, with sliding scale humalog during the day.  Per discussion with [facility] it seems she was been consistently getting insulin  despite some hypoglycemic episodes - though this is the first time she has been symptomatic from it.  BG stable during hospitalization with no insulin .  Has limited PO intake at baseline.  - PT/OT to treat - RD consulted - Could consider repeat MRI brain due to previous history of ICH, now off eliquis  for A- fib - CBG monitoring BID - Fall precautions - CBG seems to be stable during hospitalization, will reevaluate oral vs insulin  regimen upon discharge

## 2023-02-18 NOTE — Assessment & Plan Note (Addendum)
 Please review oncology history for additional details and timeline of events.  Initial diagnosis and management was at [facility] under the direction of Dr. Cheree Cords.  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  She was eventually started on systemic treatments with gemcitabine , cisplatin  and durvalumab  on 11/21/2022.  She received 1 cycle of systemic chemo immunotherapy.  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  Currently living in a rehab facility.  ECOG performance status is 4.  Today she was accompanied by her son to clinic.  Explained to them that her performance status precludes systemic treatments at this time as risks significantly outweigh benefits.  They verbalized understanding.  Advised to continue rehab and work on improving her strength.  If her performance status improves to ECOG 2, we could resume her systemic treatments, at least with immunotherapy and dose reduced chemotherapy.  To assess current disease status, we will submit request for PET/CT to be done at the end of this month and I will see her in clinic for follow-up and formulating further plan of care.  If her performance status remains poor, hospice would be the best option for her and this was also discussed today.

## 2023-02-18 NOTE — Assessment & Plan Note (Signed)
 Mentation continues to improve. She is AAOx3.  This likely was in the setting of hypoglycemia upon arrival possibly due to diabetes regimen in the setting of decreased PO intake - Delirium precautions - UA - Bcx 1 of 4: Positive Staph Epi, suspect contaminant

## 2023-02-18 NOTE — Plan of Care (Signed)

## 2023-02-18 NOTE — Inpatient Diabetes Management (Signed)
 Inpatient Diabetes Program Recommendations  AACE/ADA: New Consensus Statement on Inpatient Glycemic Control (2015)  Target Ranges:  Prepandial:   less than 140 mg/dL      Peak postprandial:   less than 180 mg/dL (1-2 hours)      Critically ill patients:  140 - 180 mg/dL   Lab Results  Component Value Date   GLUCAP 180 (H) 02/18/2023   HGBA1C 8.8 (H) 10/22/2022    Review of Glycemic Control  Latest Reference Range & Units 02/17/23 11:53 02/17/23 16:51 02/18/23 07:40  Glucose-Capillary 70 - 99 mg/dL 213 (H) 086 (H) 578 (H)   Diabetes history: DM 2  Outpatient Diabetes medications:  Lantus  36 units q HS (Hold if CBG<140 mg/dL) Humalog 0-12 units tid with meals  Current orders for Inpatient glycemic control:  None  Inpatient Diabetes Program Recommendations:    Note that patient admitted with low blood sugars (from facility).  Possibly needs reduction in home basal insulin ?  If appropriate, consider adding Novolog  very sensitive (0-6 units) tid with meals and HS while in hospital.   Thanks,  Josefa Ni, RN, BC-ADM Inpatient Diabetes Coordinator Pager [phone]  (8a-5p)

## 2023-02-18 NOTE — Assessment & Plan Note (Signed)
 RVP positive for RSV.  Likely exposed at her ALF. - Supportive care - Tylenol PRN - Mucinex 600 mg BID

## 2023-02-18 NOTE — Assessment & Plan Note (Signed)
 GIPP positive for norovirus - Encourage PO intake as tolerable

## 2023-02-18 NOTE — Progress Notes (Signed)
 Patient is from [facility] short term. CSW awaiting PT/ OT recommendations. TOC will continue to follow and assist with patients dc planning needs.

## 2023-02-18 NOTE — Assessment & Plan Note (Signed)
 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 Currently in a rehab facility with limited mobility, spending 90% of the time in bed. Significant improvement in strength is needed to tolerate further cancer treatment. Emphasized increased physical activity and rehabilitation to improve performance status. - Continue rehabilitation at  [employer] [facility] - Encourage activities to improve strength and mobility - Monitor progress and reassess in four weeks

## 2023-02-18 NOTE — Assessment & Plan Note (Signed)
 Acute swelling of RUE. Patient asymptomatic, without any erythema, warmth or pain upon palpation.  Suspect edema related to to radiation therapy.  - RUE [place] negative for DVT

## 2023-02-18 NOTE — Progress Notes (Signed)
 Daily Progress Note Intern Pager: [phone]  Patient name: Erin Hoffman Medical record number: [id number] Date of birth: 10-Feb-1945 Age: 78 y.o. Gender: female  Primary Care Provider: Kathyleen Parkins, MD Consultants: RD  Code Status: DNR/DNI  Pt Overview and Major Events to Date:  2/8: Admitted   Assessment and Plan: MARDA MIR is a 78 y.o. female with PMHx of metastatic cholangiocarcinoma, T2DM, HTN, hypothyroidism and A-fib presenting with AMS and hypoglycemia from ALF likely secondary to getting too much insulin  in the setting of decreased appetite 2/2 RSV and Norovirus.   Assessment & Plan Hypoglycemia On admission pt presented w/ decreased PO intake and continuation of insulin  at bedtime when BG > 140 nightly.  Home regimen includes 36 units of insulin  at bedtime as needed, with sliding scale humalog during the day.  Per discussion with [facility] it seems she was been consistently getting insulin  despite some hypoglycemic episodes - though this is the first time she has been symptomatic from it.  BG stable during hospitalization with no insulin .  Has limited PO intake at baseline.  - PT/OT to treat - RD consulted - Could consider repeat MRI brain due to previous history of ICH, now off eliquis  for A- fib - CBG monitoring BID - Fall precautions - CBG seems to be stable during hospitalization, will reevaluate oral vs insulin  regimen upon discharge AMS (altered mental status) Mentation continues to improve. She is AAOx3.  This likely was in the setting of hypoglycemia upon arrival possibly due to diabetes regimen in the setting of decreased PO intake - Delirium precautions - UA - Bcx 1 of 4: Positive Staph Epi, suspect contaminant  RSV (respiratory syncytial virus infection) RVP positive for RSV.  Likely exposed at her ALF. - Supportive care - Tylenol  PRN - Mucinex  600 mg BID  Norovirus GIPP positive for norovirus - Encourage PO intake as tolerable  Edema of  right upper extremity Acute swelling of RUE. Patient asymptomatic, without any erythema, warmth or pain upon palpation. Suspect edema related to to radiation therapy.  - RUE US  negative for DVT   Chronic and Stable Problems: Metastatic cholangiocarcinoma: Follows with [facility] hematology/oncology Dr. Randye Buttner.  Last seen 2/7.  Previously treated with cisplatin , gemcitabine , and durvalumab  in 11/2022.  Uses oxycodone  and fentanyl  patch for pain.  Continue fentanyl  patch here to prevent withdrawal.  Oxycodone  5 mg q6h as needed.  Will discuss further with family members to discuss engaging in Palliative services.  T2DM: Home regimen includes insulin  36 units nightly as needed based on elevated blood glucoses (>140 per facility).  Discontinued glimepiride 4 mg BID, metformin 500 mg BID.  HTN: Soft normotensive pressures. Holding home medications at this time. Hypothyroidism: Continue Synthroid  137 mcg daily. A-fib: Not on Eliquis , therefore higher risk of stroke.  CT head negative. GERD: Home Protonix  40 mg daily  HLD: Continue Crestor  20 mg daily   FEN/GI: Regular diet PPx: Lovenox  Dispo: SNF pending medical stability  Subjective:  Patient is doing well this morning.  She has no complaints this morning other than some back pain likely from laying in bed vs chronic from her metastatic cholangiocarcinoma.  Plan to discuss palliative care services with family today.  Objective: Temp:  [97.7 F (36.5 C)-98.6 F (37 C)] 97.7 F (36.5 C) (02/10 1140) Pulse Rate:  [72-87] 87 (02/10 1140) Resp:  [16-18] 18 (02/10 1140) BP: (113-139)/(56-63) 113/56 (02/10 1140) SpO2:  [90 %-92 %] 92 % (02/10 1140) Physical Exam: General: Laying in bed  comfortably in NAD. Cardiovascular: RRR.  No M/R/G. Respiratory: CTAB.  Normal WOB on RA. Abdomen: Soft, nontender, nondistended.  Normoactive bowel sounds. 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  No BLE edema.  No  deformities noted.   Laboratory: Most recent CBC Lab Results  Component Value Date   WBC 9.1 02/18/2023   HGB 9.8 (L) 02/18/2023   HCT 29.4 (L) 02/18/2023   MCV 91.9 02/18/2023   PLT 229 02/18/2023   Most recent BMP    Latest Ref Rng & Units 02/18/2023    6:26 AM  BMP  Glucose 70 - 99 mg/dL 161   BUN 8 - 23 mg/dL 20   Creatinine 0.96 - 1.00 mg/dL 0.45   Sodium 409 - 811 mmol/L 133   Potassium 3.5 - 5.1 mmol/L 4.1   Chloride 98 - 111 mmol/L 102   CO2 22 - 32 mmol/L 23   Calcium  8.9 - 10.3 mg/dL 8.4    Imaging/Diagnostic Tests: No new imaging.  [name], DO 02/18/2023, 2:02 PM  PGY-1, [facility] Family Medicine FPTS Intern pager: [phone], text pages welcome Secure chat group [facility] Teaching Service

## 2023-02-18 NOTE — Plan of Care (Signed)
 Called patient's son Erin Hoffman to discuss her care and likelihood of going back to [facility].  We also discussed the possibility of palliative care being consulted in the setting of her metastatic cholangiocarcinoma to discuss goals of care with the family.  Erin Hoffman is interested at this time to have further discussions about this.  Palliative consult placed for discharge.  [name], DO [facility] Family Medicine, PGY-1 02/18/23 6:10 PM  Service pager [phone]

## 2023-02-19 DIAGNOSIS — B338 Other specified viral diseases: Secondary | ICD-10-CM | POA: Diagnosis not present

## 2023-02-19 DIAGNOSIS — E440 Moderate protein-calorie malnutrition: Secondary | ICD-10-CM

## 2023-02-19 DIAGNOSIS — A0811 Acute gastroenteropathy due to Norwalk agent: Secondary | ICD-10-CM | POA: Diagnosis not present

## 2023-02-19 DIAGNOSIS — C221 Intrahepatic bile duct carcinoma: Secondary | ICD-10-CM | POA: Diagnosis not present

## 2023-02-19 LAB — BASIC METABOLIC PANEL
Anion gap: 13 (ref 5–15)
BUN: 19 mg/dL (ref 8–23)
CO2: 22 mmol/L (ref 22–32)
Calcium: 8.8 mg/dL — ABNORMAL LOW (ref 8.9–10.3)
Chloride: 100 mmol/L (ref 98–111)
Creatinine, Ser: 0.63 mg/dL (ref 0.44–1.00)
GFR, Estimated: >60 mL/min (ref >60)
Glucose, Bld: 184 mg/dL — ABNORMAL HIGH (ref 70–99)
Potassium: 4.1 mmol/L (ref 3.5–5.1)
Sodium: 135 mmol/L (ref 135–145)

## 2023-02-19 LAB — CULTURE, BLOOD (ROUTINE X 2): Special Requests: ADEQUATE

## 2023-02-19 LAB — CBC
HCT: 30.9 % — ABNORMAL LOW (ref 36.0–46.0)
Hemoglobin: 10.1 g/dL — ABNORMAL LOW (ref 12.0–15.0)
MCH: 29.9 pg (ref 26.0–34.0)
MCHC: 32.7 g/dL (ref 30.0–36.0)
MCV: 91.4 fL (ref 80.0–100.0)
Platelets: 241 10*3/uL (ref 150–400)
RBC: 3.38 MIL/uL — ABNORMAL LOW (ref 3.87–5.11)
RDW: 16.2 % — ABNORMAL HIGH (ref 11.5–15.5)
WBC: 9.7 10*3/uL (ref 4.0–10.5)
nRBC: 0 % (ref 0.0–0.2)

## 2023-02-19 LAB — GLUCOSE, CAPILLARY: Glucose-Capillary: 172 mg/dL — ABNORMAL HIGH (ref 70–99)

## 2023-02-19 NOTE — Assessment & Plan Note (Addendum)
Acute swelling of RUE. Patient asymptomatic, without any erythema, warmth or pain upon palpation.  Suspect edema related to to radiation therapy.  - RUE [place] negative for DVT

## 2023-02-19 NOTE — Plan of Care (Signed)

## 2023-02-19 NOTE — Assessment & Plan Note (Addendum)
On admission pt presented w/ decreased PO intake and continuation of insulin at bedtime when BG > 140 nightly.  Home regimen includes 36 units of insulin at bedtime as needed, with sliding scale humalog during the day.  Per discussion with [facility] it seems she was been consistently getting insulin despite some hypoglycemic episodes - though this is the first time she has been symptomatic from it.  BG stable during hospitalization with no insulin.  Has limited PO intake at baseline.  - Fall precautions - PT/OT to treat - RD consulted - CBG monitoring BID - CBG seems to be stable during hospitalization, will discontinue diabetic regimen outpatient

## 2023-02-19 NOTE — Progress Notes (Signed)
Daily Progress Note Intern Pager: [phone]  Patient name: Erin Hoffman Medical record number: [id number] Date of birth: 10-16-1945 Age: 78 y.o. Gender: female  Primary Care Provider: [name], MD Consultants: RD Code Status: DNR/DNI  Pt Overview and Major Events to Date:  2/8: Admitted  Assessment and Plan: Erin Hoffman is a 78 y.o. female with PMHx of metastatic cholangiocarcinoma, T2DM, HTN, hypothyroidism and A-fib presenting with AMS and hypoglycemia from ALF likely secondary to getting too much insulin in the setting of decreased appetite 2/2 RSV and Norovirus.  No longer hypoglycemic or altered.  Medically stable for discharge pending PT/OT eval for disposition.  Assessment & Plan Hypoglycemia On admission pt presented w/ decreased PO intake and continuation of insulin at bedtime when BG > 140 nightly.  Home regimen includes 36 units of insulin at bedtime as needed, with sliding scale humalog during the day.  Per discussion with [facility] it seems she was been consistently getting insulin despite some hypoglycemic episodes - though this is the first time she has been symptomatic from it.  BG stable during hospitalization with no insulin.  Has limited PO intake at baseline.  - Fall precautions - PT/OT to treat - RD consulted - CBG monitoring BID - CBG seems to be stable during hospitalization, will discontinue diabetic regimen outpatient AMS (altered mental status) Mentation continues to improve. She is AAOx3.  This likely was in the setting of hypoglycemia upon arrival possibly due to diabetes regimen in the setting of decreased PO intake - Delirium precautions - UA - Bcx 1 of 4: Positive Staph Epi, suspect contaminant  RSV (respiratory syncytial virus infection) RVP positive for RSV.  Likely exposed at her ALF. - Supportive care - Tylenol PRN - Mucinex 600 mg BID  Norovirus GIPP positive for norovirus - Encourage PO intake as tolerable  Edema of  right upper extremity Acute swelling of RUE. Patient asymptomatic, without any erythema, warmth or pain upon palpation.  Suspect edema related to to radiation therapy.  - RUE [place] negative for DVT  Malnutrition of moderate degree RD consulted. - Glucerna Shake BID between meals - Assist with feeding patient  Chronic and Stable Problems: Metastatic cholangiocarcinoma: Follows with [facility] health hematology/oncology Dr. Arlana Pouch.  Last seen 2/7.  Previously treated with cisplatin, gemcitabine, and durvalumab in 11/2022.  Uses oxycodone and fentanyl patch for pain.  Continue fentanyl patch here to prevent withdrawal.  Oxycodone 5 mg q6h as needed.  Will discuss further with family members to discuss engaging in Palliative services.  T2DM: Home regimen includes insulin 36 units nightly as needed based on elevated blood glucoses (>140 per facility).  Discontinued glimepiride 4 mg BID, metformin 500 mg BID.  Will discontinue all diabetic regimen at discharge. HTN: Soft/normotensive pressures. Holding home medications at this time. Hypothyroidism: Continue Synthroid 137 mcg daily. A-fib: Not on Eliquis, therefore higher risk of stroke.  CT head negative. GERD: Home Protonix 40 mg daily  HLD: Continue Crestor 20 mg daily   FEN/GI: Regular diet PPx: Lovenox Dispo: Pending PT OT eval  Subjective:  Patient is doing well this morning and has no concerns.  She is eating her breakfast but was not interested in it due to lack of flavor.  Was interested in eating her banana this morning.  No other concerns.  Amenable to palliative care conversations with family.  Objective: Temp:  [97.7 F (36.5 C)-98.2 F (36.8 C)] 98 F (36.7 C) (02/11 0758) Pulse Rate:  [71-87] 79 (02/11 0758) Resp:  [  16-18] 16 (02/11 0758) BP: (113-136)/(56-67) 128/63 (02/11 0758) SpO2:  [90 %-93 %] 93 % (02/11 0758) Physical Exam: General: Laying in bed comfortably in NAD. Cardiovascular: RRR.  No M/R/G. Respiratory: CTAB.  Normal  WOB on RA. Abdomen: Soft, nontender, nondistended.  Normoactive bowel sounds. 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  No BLE edema.  No deformities noted.   Laboratory: Most recent CBC Lab Results  Component Value Date   WBC 9.7 02/19/2023   HGB 10.1 (L) 02/19/2023   HCT 30.9 (L) 02/19/2023   MCV 91.4 02/19/2023   PLT 241 02/19/2023   Most recent BMP    Latest Ref Rng & Units 02/19/2023    4:56 AM  BMP  Glucose 70 - 99 mg/dL 161   BUN 8 - 23 mg/dL 19   Creatinine 0.96 - 1.00 mg/dL 0.45   Sodium 409 - 811 mmol/L 135   Potassium 3.5 - 5.1 mmol/L 4.1   Chloride 98 - 111 mmol/L 100   CO2 22 - 32 mmol/L 22   Calcium 8.9 - 10.3 mg/dL 8.8    Imaging/Diagnostic Tests: No new imaging.  Fortunato Curling, DO 02/19/2023, 11:08 AM  PGY-1, [facility] Family Medicine FPTS Intern pager: [phone], text pages welcome Secure chat group [facility] Teaching Service

## 2023-02-19 NOTE — Assessment & Plan Note (Addendum)
Mentation continues to improve. She is AAOx3.  This likely was in the setting of hypoglycemia upon arrival possibly due to diabetes regimen in the setting of decreased PO intake - Delirium precautions - UA - Bcx 1 of 4: Positive Staph Epi, suspect contaminant

## 2023-02-19 NOTE — Assessment & Plan Note (Addendum)
RVP positive for RSV.  Likely exposed at her ALF. - Supportive care - Tylenol PRN - Mucinex 600 mg BID

## 2023-02-19 NOTE — Assessment & Plan Note (Signed)
RD consulted. - Glucerna Shake BID between meals - Assist with feeding patient

## 2023-02-19 NOTE — TOC Initial Note (Addendum)
Transition of Care [facility]) - Initial/Assessment Note    Patient Details  Name: Erin Hoffman MRN: [id number] Date of Birth: 11/03/1945  Transition of Care [facility]) CM/SW Contact:    Delilah Shan, LCSWA Phone Number: 02/19/2023, 2:26 PM  Clinical Narrative:                  CSW received consult for possible SNF placement at time of discharge. CSW spoke with patient regarding PT recommendation of SNF placement at time of discharge. Patient reports she comes from [facility] short term. Patient expressed understanding of PT recommendation and is agreeable to SNF placement at time of discharge. Patient would like to return to [facility] for short term rehab.CSW discussed insurance authorization process. No further questions reported at this time. CSW to continue to follow and assist with discharge planning needs.   Update- CSW spoke with Star with [facility] who confirmed SNF bed for patient. CSW LVM for Tammy with [facility]. CSW awaiting call back to start insurance authorization for SNF and PTAR.   Update- CSW spoke with Tammy with [facility] and started insurance authorization for SNF and PTAR.  Insurance authorization for SNF and PTAR currently pending.   Expected Discharge Plan: Skilled Nursing Facility Barriers to Discharge: Continued Medical Work up   Patient Goals and CMS Choice Patient states their goals for this hospitalization and ongoing recovery are:: SNF CMS Medicare.gov Compare Post Acute Care list provided to:: Patient Choice offered to / list presented to : Patient      Expected Discharge Plan and Services In-house Referral: Clinical Social Work     Living arrangements for the past 2 months:  (from [facility] short term)                                      Prior Living Arrangements/Services Living arrangements for the past 2 months:  (from [facility] short term) Lives with:: Self Patient language and need for interpreter reviewed:: Yes Do you feel safe  going back to the place where you live?: No   SNF  Need for Family Participation in Patient Care: Yes (Comment) Care giver support system in place?: Yes (comment)   Criminal Activity/Legal Involvement Pertinent to Current Situation/Hospitalization: No - Comment as needed  Activities of Daily Living   ADL Screening (condition at time of admission) Independently performs ADLs?: No Does the patient have a NEW difficulty with bathing/dressing/toileting/self-feeding that is expected to last >3 days?: No Does the patient have a NEW difficulty with getting in/out of bed, walking, or climbing stairs that is expected to last >3 days?: No Does the patient have a NEW difficulty with communication that is expected to last >3 days?: No Is the patient deaf or have difficulty hearing?: No Does the patient have difficulty seeing, even when wearing glasses/contacts?: No Does the patient have difficulty concentrating, remembering, or making decisions?: Yes  Permission Sought/Granted Permission sought to share information with : Case Manager, Magazine features editor, Family Supports Permission granted to share information with : Yes, Verbal Permission Granted  Share Information with NAME: Genevie Cheshire or [name]  Permission granted to share info w AGENCY: SNF  Permission granted to share info w Relationship: Genevie Cheshire and [name]  Permission granted to share info w Contact Information: Genevie Cheshire [phone] [name] [phone]  Emotional Assessment   Attitude/Demeanor/Rapport: Gracious Affect (typically observed): Calm Orientation: : Oriented to Self, Oriented to Place, Oriented to  Situation Alcohol / Substance Use: Not Applicable Psych Involvement: No (comment)  Admission diagnosis:  Confusion [R41.0] Hypoglycemia [E16.2] RSV (respiratory syncytial virus infection) [B33.8] Patient Active Problem List   Diagnosis Date Noted   Malnutrition of moderate degree 02/19/2023   CVA (cerebral vascular accident) (HCC)  02/18/2023   Cancer associated pain 02/18/2023   Confusion 02/18/2023   Norovirus 02/17/2023   Edema of right upper extremity 02/17/2023   Hypoglycemia 02/16/2023   AMS (altered mental status) 02/16/2023   RSV (respiratory syncytial virus infection) 02/16/2023   Port-A-Cath in place 02/15/2023   Metastasis to bone (HCC) 01/23/2023   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 [place] [address] Phone: [phone] Fax: [phone]     Social Drivers of Health (SDOH) Social History: SDOH Screenings   Food Insecurity: No Food Insecurity (02/16/2023)  Housing: Unknown (02/16/2023)  Transportation Needs: No Transportation Needs (02/16/2023)  Utilities: Not At Risk (02/16/2023)  Depression (PHQ2-9): Low Risk  (02/15/2023)  Social Connections: Unknown (02/16/2023)  Tobacco Use: Medium Risk (02/18/2023)   SDOH Interventions:     Readmission Risk Interventions     No data to display

## 2023-02-19 NOTE — Progress Notes (Signed)
OT Cancellation Note  Patient Details Name: Erin Hoffman MRN: [id number] DOB: Mar 26, 1945   Cancelled Treatment:    Reason Eval/Treat Not Completed: Other (comment) (Pt from Snf and plan is to return to SNF. No acute care needs at this time. Will defer any OT needs to SNF.)  [facility] 02/19/2023, 1:05 PM Luisa Dago, OT/L   Acute OT Clinical Specialist Acute Rehabilitation Services Pager [phone] Office [phone]

## 2023-02-19 NOTE — Discharge Instructions (Addendum)
Dear Erin Hoffman,  Thank you for letting us participate in your care. You were hospitalized for hypoglycemia (low blood sugars) and altered mental status.  You were treated with fluids and medications that help improve your sugars.  We discontinued your insulin regimen and oral medications for diabetes prior to discharge since you have decreased oral intake and have maintained stable sugars without insulin in the hospital.  Your mentation has significantly improved since being admitted.  You tested positive for RSV and norovirus likely acquired at [facility].  The best course of treatment for this is supportive care and hydration.  For metastatic cholangiocarcinoma and pain we consulted palliative services after speaking with your family.  Further discussions to be had outpatient.  POST-HOSPITAL & CARE INSTRUCTIONS Encourage palliative care conversations and follow-up with your PCP. Please ensure that you are no longer getting insulin or any diabetic oral medications unless advised by doctor upon checking your sugar levels. Go to your follow up appointments (listed below)   DOCTOR'S APPOINTMENT   Future Appointments  Date Time Provider Department Center  03/04/2023  8:45 AM [facility]POST TREATMENT [facility] None  03/04/2023  9:30 AM [facility] TREATMENT [facility] None  03/07/2023 10:00 AM [facility] PET CT 1 [facility] [place]  03/15/2023  2:15 PM [facility] FLUSH [facility] None  03/15/2023  2:40 PM Pasam, Avinash, MD [facility] None     Take care and be well!  Family Medicine Teaching Service Inpatient Team [place]  [facility]  [address] [place], [place] [address] [phone]

## 2023-02-19 NOTE — NC FL2 (Signed)
[place] MEDICAID [facility] LEVEL OF CARE FORM     IDENTIFICATION  Patient Name: Erin Hoffman Birthdate: 1945-04-23 Sex: female Admission Date (Current Location): 02/16/2023  [facility] and [place] Number:  Producer, television/film/video and Address:  [facility] [place]. [facility], [address], [place], [place] [address]      Provider Number: [id number]  Attending Physician Name and Address:  Nestor Ramp, MD  Relative Name and Phone Number:  Genevie Cheshire (son) [phone], [name] (son) [phone]    Current Level of Care: Hospital Recommended Level of Care: Skilled Nursing Facility Prior Approval Number:    Date Approved/Denied:   PASRR Number: [id number]  Discharge Plan: SNF    Current Diagnoses: Patient Active Problem List   Diagnosis Date Noted   Malnutrition of moderate degree 02/19/2023   CVA (cerebral vascular accident) (HCC) 02/18/2023   Cancer associated pain 02/18/2023   Confusion 02/18/2023   Norovirus 02/17/2023   Edema of right upper extremity 02/17/2023   Hypoglycemia 02/16/2023   AMS (altered mental status) 02/16/2023   RSV (respiratory syncytial virus infection) 02/16/2023   Port-A-Cath in place 02/15/2023   Metastasis to bone (HCC) 01/23/2023   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) 12/06/2022   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) 11/13/2022   Hypothyroidism 10/23/2022   DMII (diabetes mellitus, type 2) (HCC) 10/23/2022   GERD (gastroesophageal reflux disease) 10/23/2022   HLD (hyperlipidemia) 10/23/2022   HTN (hypertension) 10/23/2022   Liver mass, left lobe 10/23/2022   New onset a-fib (HCC) 10/23/2022   Atrial fibrillation (HCC) 10/22/2022    Orientation RESPIRATION BLADDER Height & Weight     Self, Place, Situation  Normal Incontinent Weight: 180 lb (81.6 kg) Height:  5\' 9"  (175.3 cm)  BEHAVIORAL SYMPTOMS/MOOD NEUROLOGICAL BOWEL NUTRITION STATUS      Incontinent Diet (Please see discharge summary)   AMBULATORY STATUS COMMUNICATION OF NEEDS Skin   Extensive Assist Verbally Other (Comment) (Erythema Buttocks,Bil.,Wound/Incision LDAS,PI sacrum,mid,Lower,stage 2)                       Personal Care Assistance Level of Assistance  Bathing, Feeding, Dressing Bathing Assistance: Maximum assistance Feeding assistance: Limited assistance Dressing Assistance: Maximum assistance     Functional Limitations Info  Sight, Hearing, Speech Sight Info: Impaired Hearing Info: Impaired Speech Info: Adequate    SPECIAL CARE FACTORS FREQUENCY  PT (By licensed PT), OT (By licensed OT)     PT Frequency: 5x min weekly OT Frequency: 5x min weekly            Contractures Contractures Info: Not present    Additional Factors Info  Code Status, Allergies, Psychotropic, Isolation Precautions Code Status Info: DNR Allergies Info: Tape Psychotropic Info: gabapentin (NEURONTIN) capsule 100 mg 2 times daily   Isolation Precautions Info: Droplet and Enteric precautions (UV disinfection)     Current Medications (02/19/2023):  This is the current hospital active medication list Current Facility-Administered Medications  Medication Dose Route Frequency Provider Last Rate Last Admin   acetaminophen (TYLENOL) tablet 650 mg  650 mg Oral Q6H PRN Fortunato Curling, DO   650 mg at 02/16/23 2045   Or   acetaminophen (TYLENOL) suppository 650 mg  650 mg Rectal Q6H PRN Fortunato Curling, DO       Chlorhexidine Gluconate Cloth 2 % PADS 6 each  6 each Topical Daily Nestor Ramp, MD   6 each at 02/19/23 0853   dextrose 50 % solution 50 mL  1  ampule Intravenous PRN Fortunato Curling, DO       enoxaparin (LOVENOX) injection 40 mg  40 mg Subcutaneous Q24H Fatima Blank, Adriana, DO   40 mg at 02/18/23 2136   feeding supplement (GLUCERNA SHAKE) (GLUCERNA SHAKE) liquid 237 mL  237 mL Oral BID BM Nestor Ramp, MD   237 mL at 02/19/23 0854   fentaNYL (DURAGESIC) 12 MCG/HR 1 patch  1 patch Transdermal Q72H Gomes, Adriana, DO   1  patch at 02/16/23 2021   gabapentin (NEURONTIN) capsule 100 mg  100 mg Oral BID Nestor Ramp, MD   100 mg at 02/19/23 0853   guaiFENesin (MUCINEX) 12 hr tablet 600 mg  600 mg Oral BID Peterson Ao, MD   600 mg at 02/19/23 0852   guaiFENesin-dextromethorphan (ROBITUSSIN DM) 100-10 MG/5ML syrup 5 mL  5 mL Oral Q4H PRN Nestor Ramp, MD   5 mL at 02/18/23 2024   levothyroxine (SYNTHROID) tablet 137 mcg  137 mcg Oral QAC breakfast Peterson Ao, MD   137 mcg at 02/19/23 1610   multivitamin with minerals tablet 1 tablet  1 tablet Oral Daily Nestor Ramp, MD   1 tablet at 02/19/23 9604   oxyCODONE (Oxy IR/ROXICODONE) immediate release tablet 5 mg  5 mg Oral Q6H PRN Alicia Amel, MD   5 mg at 02/18/23 1237   pantoprazole (PROTONIX) EC tablet 40 mg  40 mg Oral Daily Peterson Ao, MD   40 mg at 02/19/23 0853   rosuvastatin (CRESTOR) tablet 20 mg  20 mg Oral QHS Peterson Ao, MD   20 mg at 02/18/23 2136   sodium chloride flush (NS) 0.9 % injection 10-40 mL  10-40 mL Intracatheter Q12H Nestor Ramp, MD   10 mL at 02/19/23 0853   sodium chloride flush (NS) 0.9 % injection 10-40 mL  10-40 mL Intracatheter PRN Nestor Ramp, MD         Discharge Medications: Please see discharge summary for a list of discharge medications.  Relevant Imaging Results:  Relevant Lab Results:   Additional Information [id number]  Delilah Shan, LCSWA

## 2023-02-19 NOTE — Evaluation (Signed)
Physical Therapy Evaluation Patient Details Name: Erin Hoffman MRN: [id number] DOB: 17-May-1945 Today's Date: 02/19/2023  History of Present Illness  Erin Hoffman is a 78 y.o. female presenting 2/8 with AMS and hypoglycemia from SNF likely secondary to getting too much insulin in the setting of decreased appetite 2/2 RSV and Noroviruswith.  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 Pt needing max to total assist for most mobility.  Pt does try to participate however is limited by weakness left hemibody.  Also reports pain.  Will follow acutely and progress pt as able.  Pt currently with functional limitations due to the deficits listed below (see PT Problem List). Pt will benefit from acute skilled PT to increase their independence and safety with mobility to allow discharge.           If plan is discharge home, recommend the following: Two people to help with walking and/or transfers;Two people to help with bathing/dressing/bathroom;Assistance with cooking/housework;Assistance with feeding;Assist for transportation;Help with stairs or ramp for entrance;Supervision due to cognitive status   Can travel by private vehicle   No    Equipment Recommendations None recommended by PT  Recommendations for Other Services       Functional Status Assessment Patient has had a recent decline in their functional status and demonstrates the ability to make significant improvements in function in a reasonable and predictable amount of time.     Precautions / Restrictions Precautions Precautions: Fall Restrictions Weight Bearing Restrictions Per Provider Order: No      Mobility  Bed Mobility Overal bed mobility: Needs Assistance Bed Mobility: Supine to Sit, Sit to Supine     Supine to sit: Total assist, HOB elevated, Used rails Sit to supine: Total assist   General bed mobility  comments: Pt needed total assist to come to EOB with assist for LEs and trunk with pt able to assist 10%.  Pt soiled and NT came in and assist PT with cleaning pt and positioning prior to departure.    Transfers                   General transfer comment: NT    Ambulation/Gait               General Gait Details: Pt hasnt walked since CVA in Nov  Stairs            Wheelchair Mobility     Tilt Bed    Modified Rankin (Stroke Patients Only) Modified Rankin (Stroke Patients Only) Pre-Morbid Rankin Score: Severe disability Modified Rankin: Severe disability     Balance Overall balance assessment: Needs assistance Sitting-balance support: Bilateral upper extremity supported, Feet supported Sitting balance-Leahy Scale: Zero Sitting balance - Comments: Needed total to max assist to sit up on EOB for 10 minutes. Postural control: Posterior lean, Left lateral lean, Right lateral lean                                   Pertinent Vitals/Pain Pain Assessment Pain Assessment: Faces Faces Pain Scale: Hurts whole lot Pain Location: back and left leg Pain Descriptors / Indicators: Aching, Grimacing, Guarding, Discomfort Pain Intervention(s): Limited activity within patient's tolerance, Monitored during session, Repositioned    Home Living Family/patient expects to be discharged to:: Skilled nursing facility  Additional Comments: Has been at [facility] since after her stroke Nov the day after Thanksgiving    Prior Function               Mobility Comments: Pivoted to chair with assist by staff with 2 persons; at times they used lift; to gym a few times per pt but she had difficulty due to back pain per pt ADLs Comments: B/D by staff, pt washes face and feeds self     Extremity/Trunk Assessment   Upper Extremity Assessment Upper Extremity Assessment: Defer to OT evaluation    Lower Extremity Assessment Lower  Extremity Assessment: RLE deficits/detail;LLE deficits/detail RLE Deficits / Details: WFL for tasks assessed LLE Deficits / Details: hip 1/5, knee 1/5, ankle 0/5       Communication   Communication Communication: No apparent difficulties    Cognition Arousal: Alert Behavior During Therapy: Flat affect   PT - Cognitive impairments: No family/caregiver present to determine baseline, Difficult to assess, Orientation, Awareness, Sequencing, Problem solving, Safety/Judgement   Orientation impairments: Time                   PT - Cognition Comments: Pt follows one step commands consistently. Aware of deficits. Following commands: Intact       Cueing Cueing Techniques: Gestural cues, Verbal cues, Tactile cues, Visual cues     General Comments General comments (skin integrity, edema, etc.): Bil UE edema with left UE propped on pillow pre and post PT evaluation.    Exercises General Exercises - Lower Extremity Ankle Circles/Pumps: PROM, Left, 5 reps, Supine Heel Slides: PROM, Left, 5 reps, Supine Hip Flexion/Marching: PROM, Left, 5 reps, Supine   Assessment/Plan    PT Assessment Patient needs continued PT services  PT Problem List Decreased strength;Decreased activity tolerance;Decreased balance;Decreased mobility;Decreased coordination;Decreased cognition;Decreased knowledge of use of DME;Decreased safety awareness;Pain;Decreased range of motion;Decreased skin integrity       PT Treatment Interventions DME instruction;Functional mobility training;Therapeutic activities;Therapeutic exercise;Balance training;Wheelchair mobility training;Patient/family education    PT Goals (Current goals can be found in the Care Plan section)  Acute Rehab PT Goals Patient Stated Goal: to get stronger PT Goal Formulation: With patient Time For Goal Achievement: 03/05/23 Potential to Achieve Goals: Good    Frequency Min 1X/week     Co-evaluation               AM-PAC PT "6  Clicks" Mobility  Outcome Measure Help needed turning from your back to your side while in a flat bed without using bedrails?: Total Help needed moving from lying on your back to sitting on the side of a flat bed without using bedrails?: Total Help needed moving to and from a bed to a chair (including a wheelchair)?: Total Help needed standing up from a chair using your arms (e.g., wheelchair or bedside chair)?: Total Help needed to walk in hospital room?: Total Help needed climbing 3-5 steps with a railing? : Total 6 Click Score: 6    End of Session   Activity Tolerance: Patient limited by fatigue;Patient limited by pain Patient left: in bed;with call bell/phone within reach;with bed alarm set;with nursing/sitter in room (NT placing purewick) Nurse Communication: Mobility status;Need for lift equipment PT Visit Diagnosis: Muscle weakness (generalized) (M62.81);Hemiplegia and hemiparesis;Pain Hemiplegia - Right/Left: Left Hemiplegia - dominant/non-dominant: Non-dominant Hemiplegia - caused by: Other Nontraumatic intracranial hemorrhage Pain - Right/Left: Left Pain - part of body: Leg (back)    Time: 1914-7829 PT Time Calculation (min) (ACUTE ONLY): 26 min  Charges:   PT Evaluation $PT Eval Moderate Complexity: 1 Mod PT Treatments $Therapeutic Activity: 8-22 mins PT General Charges $$ ACUTE PT VISIT: 1 Visit         Mikalah Skyles M,PT Acute Rehab Services [phone]   [name] 02/19/2023, 12:45 PM

## 2023-02-19 NOTE — Assessment & Plan Note (Addendum)
GIPP positive for norovirus - Encourage PO intake as tolerable

## 2023-02-20 DIAGNOSIS — E440 Moderate protein-calorie malnutrition: Secondary | ICD-10-CM | POA: Diagnosis not present

## 2023-02-20 DIAGNOSIS — Z7189 Other specified counseling: Secondary | ICD-10-CM | POA: Diagnosis not present

## 2023-02-20 DIAGNOSIS — Z515 Encounter for palliative care: Secondary | ICD-10-CM | POA: Diagnosis not present

## 2023-02-20 DIAGNOSIS — B338 Other specified viral diseases: Secondary | ICD-10-CM | POA: Diagnosis not present

## 2023-02-20 DIAGNOSIS — C221 Intrahepatic bile duct carcinoma: Secondary | ICD-10-CM | POA: Diagnosis not present

## 2023-02-20 DIAGNOSIS — A0811 Acute gastroenteropathy due to Norwalk agent: Secondary | ICD-10-CM | POA: Diagnosis not present

## 2023-02-20 LAB — GLUCOSE, CAPILLARY: Glucose-Capillary: 313 mg/dL — ABNORMAL HIGH (ref 70–99)

## 2023-02-20 NOTE — Assessment & Plan Note (Addendum)
GIPP positive for norovirus.  Denies any nausea or vomiting. - Encourage PO intake as tolerable

## 2023-02-20 NOTE — TOC Progression Note (Signed)
Transition of Care [facility]) - Progression Note    Patient Details  Name: ADLEAN HARDEMAN MRN: [id number] Date of Birth: 06-13-1945  Transition of Care [facility]) CM/SW Contact  Delilah Shan, LCSWA Phone Number: 02/20/2023, 10:50 AM  Clinical Narrative:     CSW received call from [place] with [facility] who informed CSW that patients insurance authorization for SNF has went to peer to peer with Dr. Jacob Moores. Tele# [phone]. Deadline by 2/13, 12 noon. PTAR was approved [id number]. CSW informed MD. CSW will continue to follow.  Expected Discharge Plan: Skilled Nursing Facility Barriers to Discharge: Continued Medical Work up  Expected Discharge Plan and Services In-house Referral: Clinical Social Work     Living arrangements for the past 2 months:  (from [facility] short term)                                       Social Determinants of Health (SDOH) Interventions SDOH Screenings   Food Insecurity: No Food Insecurity (02/16/2023)  Housing: Unknown (02/16/2023)  Transportation Needs: No Transportation Needs (02/16/2023)  Utilities: Not At Risk (02/16/2023)  Depression (PHQ2-9): Low Risk  (02/15/2023)  Social Connections: Unknown (02/16/2023)  Tobacco Use: Medium Risk (02/18/2023)    Readmission Risk Interventions     No data to display

## 2023-02-20 NOTE — Assessment & Plan Note (Addendum)
RD consulted. - Glucerna Shake BID between meals - Assist with feeding patient

## 2023-02-20 NOTE — Progress Notes (Signed)
Daily Progress Note Intern Pager: [phone]  Patient name: Erin Hoffman Medical record number: [id number] Date of birth: 1945/08/04 Age: 78 y.o. Gender: female  Primary Care Provider: [name], MD Consultants: RD  Code Status: DNR/DNI   Pt Overview and Major Events to Date:  2/8: Admitted  Assessment and Plan: Erin Hoffman is a 78 y.o. female with PMHx of metastatic cholangiocarcinoma, T2DM, HTN, hypothyroidism and A-fib presenting with AMS and hypoglycemia from ALF likely secondary to getting too much insulin in the setting of decreased appetite 2/2 RSV and Norovirus.  No longer hypoglycemic or altered.  Medically stable for discharge pending PT/OT eval for disposition.  Assessment & Plan Hypoglycemia On admission pt presented w/ decreased PO intake and continuation of insulin at bedtime when BG > 140 nightly.  Home regimen includes 36 units of insulin at bedtime as needed, with sliding scale humalog during the day.  Per discussion with [facility] it seems she was been consistently getting insulin despite some hypoglycemic episodes - though this is the first time she has been symptomatic from it.  BG stable during hospitalization with no insulin.  Has limited PO intake at baseline.  - Fall precautions - PT/OT to treat - RD consulted - CBG seems to be stable during hospitalization, will discontinue diabetic regimen outpatient AMS (altered mental status) Mentation continues to improve. She is AAOx3.  This likely was in the setting of hypoglycemia upon arrival possibly due to diabetes regimen in the setting of decreased PO intake - Delirium precautions - Bcx 1 of 4: Positive Staph Epi, suspect contaminant  RSV (respiratory syncytial virus infection) RVP positive for RSV.  Likely exposed at her ALF. - Supportive care - Tylenol PRN - Mucinex 600 mg BID  Norovirus GIPP positive for norovirus.  Denies any nausea or vomiting. - Encourage PO intake as tolerable Edema  of right upper extremity Acute swelling of RUE. Patient asymptomatic, without any erythema, warmth or pain upon palpation.  Suspect edema related to to radiation therapy.  - RUE [place] negative for DVT  Malnutrition of moderate degree RD consulted. - Glucerna Shake BID between meals - Assist with feeding patient  Chronic and Stable Problems: Metastatic cholangiocarcinoma: Follows with [facility] health hematology/oncology Dr. Arlana Pouch.  Last seen 2/7.  Previously treated with cisplatin, gemcitabine, and durvalumab in 11/2022.  Uses oxycodone and fentanyl patch for pain.  Continue fentanyl patch here to prevent withdrawal.  Oxycodone 5 mg q6h as needed.  Will discuss further with family members to discuss engaging in Palliative services.  T2DM: Home regimen includes insulin 36 units nightly as needed based on elevated blood glucoses (>140 per facility).  Discontinued glimepiride 4 mg BID, metformin 500 mg BID.  Will discontinue all diabetic regimen at discharge. HTN: Soft/normotensive pressures. Holding home medications at this time. Hypothyroidism: Continue Synthroid 137 mcg daily. A-fib: Not on Eliquis, therefore higher risk of stroke.  CT head negative. GERD: Home Protonix 40 mg daily  HLD: Continue Crestor 20 mg daily   FEN/GI: Regular diet PPx: Lovenox Dispo: Pending PT OT eval   Subjective:  Patient is laying comfortably in bed this morning and has no concerns.  She has had further discussions with palliative as well.  She denies any pain at this time other than once at baseline for her.  Objective: Temp:  [97.9 F (36.6 C)-98.1 F (36.7 C)] 98.1 F (36.7 C) (02/12 0805) Pulse Rate:  [79-84] 84 (02/12 0405) Resp:  [16-19] 16 (02/12 0805) BP: (122-142)/(58-71) 122/71 (  02/12 0405) SpO2:  [90 %-96 %] 90 % (02/12 0405) Physical Exam: General: Laying in bed comfortably NAD. Cardiovascular: RRR.  No M/R/G. Respiratory: CTAB.  Normal WOB on RA. Abdomen: Soft, nontender, nondistended.   Normoactive bowel sounds. 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 No BLE edema. No deformities noted.   Laboratory: Most recent CBC Lab Results  Component Value Date   WBC 9.7 02/19/2023   HGB 10.1 (L) 02/19/2023   HCT 30.9 (L) 02/19/2023   MCV 91.4 02/19/2023   PLT 241 02/19/2023   Most recent BMP    Latest Ref Rng & Units 02/19/2023    4:56 AM  BMP  Glucose 70 - 99 mg/dL 161   BUN 8 - 23 mg/dL 19   Creatinine 0.96 - 1.00 mg/dL 0.45   Sodium 409 - 811 mmol/L 135   Potassium 3.5 - 5.1 mmol/L 4.1   Chloride 98 - 111 mmol/L 100   CO2 22 - 32 mmol/L 22   Calcium 8.9 - 10.3 mg/dL 8.8     Imaging/Diagnostic Tests: No new imaging.  Fortunato Curling, DO 02/20/2023, 12:31 PM  PGY-1, [facility] Family Medicine FPTS Intern pager: [phone], text pages welcome Secure chat group [facility] Teaching Service

## 2023-02-20 NOTE — Assessment & Plan Note (Addendum)
On admission pt presented w/ decreased PO intake and continuation of insulin at bedtime when BG > 140 nightly.  Home regimen includes 36 units of insulin at bedtime as needed, with sliding scale humalog during the day.  Per discussion with [facility] it seems she was been consistently getting insulin despite some hypoglycemic episodes - though this is the first time she has been symptomatic from it.  BG stable during hospitalization with no insulin.  Has limited PO intake at baseline.  - Fall precautions - PT/OT to treat - RD consulted - CBG seems to be stable during hospitalization, will discontinue diabetic regimen outpatient

## 2023-02-20 NOTE — Consult Note (Signed)
 Palliative Medicine Inpatient Consult Note  Consulting Provider: Fortunato Curling, DO   Reason for consult:   Palliative Care Consult Services Palliative Medicine Consult  Reason for Consult? metastatic cancer and GOC   Outpatient as she will be discharging tomorrow and family is interested in palliative services.   02/20/2023  HPI:  Per intake H&P --> Erin Hoffman is a 78 y.o. female with PMHx of metastatic cholangiocarcinoma, T2DM, HTN, hypothyroidism and A-fib presenting with AMS and hypoglycemia from ALF likely secondary to getting too much insulin in the setting of decreased appetite 2/2 RSV and Norovirus.    The PMT is familiar with Erin Hoffman from November and December when we were ask to support goals of care conversations in the setting of a a right thalamic hemorrhage. She was discharge from the inpatient setting to [facility] for ongoing therapy.   The PMT has been asked to get involved for additional GOC conversations during this hospitalization.  Clinical Assessment/Goals of Care:  *Please note that this is a verbal dictation therefore any spelling or grammatical errors are due to the "Dragon Medical One" system interpretation.  I have reviewed medical records including EPIC notes, labs and imaging, received report from bedside RN, assessed the patient who is resting in bed in NAD.    I met with Erin Hoffman to further discuss diagnosis prognosis, GOC, EOL wishes, disposition and options.   I introduced Palliative Medicine as specialized medical care for people living with serious illness. It focuses on providing relief from the symptoms and stress of a serious illness. The goal is to improve quality of life for both the patient and the family.  Medical History Review and Understanding:  A review of Erin Hoffman's past medical history inclusive of cholangiocarcinoma, hypertension, atrial fibrillation, type 2 diabetes, hypothyroidism, and hemorrhagic stroke with left-sided  hemiplegia was held.  Social History:  Erin Hoffman is from [facility]. She is divorced. Erin Hoffman has 3 sons, 3 grandchildren, and 1 great grandchild. 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 She gets joy out of her family. She is a woman of faith and practices within the [facility] denomination.    Functional and Nutritional State:  Erin Hoffman was previously living in an apartment in [place] preceding her hemorrhagic stroke.  Though since this event she has been at [facility] for rehabilitation.  Erin Hoffman shares that her appetite has been variable.  Palliative Symptoms:  Erin Hoffman shares that she has pain in her back we reviewed that this is in the setting of her metastatic disease.  Reviewed that she is receiving a fentanyl patch, oxycodone 5 mg every 6 hours as needed, gabapentin 100 mg twice daily, and had previously been on an antispasmodic.  Erin Hoffman notes that these medications do help the pain but do not abolish the pain.  Advance Directives:  A detailed discussion was had today regarding advanced directives.    Code Status:  Concepts specific to code status, artifical feeding and hydration, continued IV antibiotics and rehospitalization was had.  The difference between a aggressive medical intervention path  and a palliative comfort care path for this patient at this time was had.   Erin Hoffman is an established DO NOT INTUBATE DO NOT RESUSCITATE CODE STATUS.    MOST form reviewed as below:  Cardiopulmonary Resuscitation: Do Not Attempt Resuscitation (DNR/No CPR)  Medical Interventions: Limited Additional Interventions: Use medical treatment, IV fluids and cardiac monitoring as indicated, DO NOT USE intubation or mechanical ventilation. May consider use  of less invasive airway support such as BiPAP or CPAP. Also provide comfort measures. Transfer to the hospital if indicated. Avoid intensive care.   Antibiotics: Antibiotics if indicated   IV Fluids: IV fluids if indicated  Feeding Tube: No feeding tube   Discussion:  We discussed Erin Hoffman's reason for hospitalization in the setting of her hypoglycemia.  We reviewed her recent viral sources inclusive of having both RSV and norovirus.  We reviewed that these made her have a poor appetite as she shares every time she did try to ingest something she had loose bowels thereafter.  She notes that overall as a result of this she just did not want to eat.  Erin Hoffman notes since her recent discharge she has not been as able to recover as she was hoping for.  She states that she was hoping to gain strength and go to live full-time with her son Erin Hoffman though he has to work.  Unfortunately due to the fact that Erin Hoffman is not able to watch after herself she shares she has had to remain at [facility].  We discussed the plan for her to go back for additional rehabilitation.  Erin Hoffman notes that she is wheelchair dependent and does need help with all B ADLs at this point in time.  I did describe to Erin Hoffman the chronic disease trajectory and if she should continue to decline considering hospice as a way to remediate her symptoms. We reviewed the chronic disease trajectory in patients who have multiple co-morbidities. I shared that often an event will occur leading to an  acute hospitalizationsuch as a fall, UTI, PNA, heart failure exacerbation, copd exacerbation, or another illness sort. We discussed that patients may have been functioning at a high plateau initially, then an acute event occurs. We discussed that after this event their function, mental, and nutritional states are compromised. Often with treatment and rehabilitation there is some regain in each individuals health, though often not to their prior baseline level. We discussed that then another event will occur causing a rehospitalization and a further decline. I shared that often this will become a pattern and each event causes greater burden to  the individual, depleting them further or their function, cognition, or nutritional state.   At this point in time Louise shares that she is not ready to "give up".  I tried to rephrase this in the context of the hospice philosophy and explain exactly the services that would be provided enabling dignity and quality when she does reach the end phase of her life.  Discussed the importance of continued conversation with family and their  medical providers regarding overall plan of care and treatment options, ensuring decisions are within the context of the patients values and GOCs. ____________________________________ Addendum:  I called and spoke with patient's son Erin Hoffman about the above. We reviewed the above conversation. Chronic disease trajectory explained.    Erin Hoffman understands and also shares he does not feel like hospice is the point they are at yet.    I shared the plan for outpatient palliative support which she is in agreement with.  Decision Maker: Erin Hoffman (son): [phone]  SUMMARY OF RECOMMENDATIONS   DNAR/DNI  Open and honest conversations held in the setting of patients declining function and chronic health conditions  Plan for [facility] with OP Palliative Support  The Palliative care team will continue to offer support as needed  Code Status/Advance Care Planning: DNAR/DNI   Symptom Management:  Metastatic Cancer Pain: - Continue Fentanyl  40mcg/hr - Continue gabapentin 100 mg twice daily - Continue Oxycodone 5mg  PO Q6H PRN  Palliative Prophylaxis:  Aspiration, Bowel Regimen, Delirium Protocol, Frequent Pain Assessment, Oral Care, Palliative Wound Care, and Turn Reposition  Additional Recommendations (Limitations, Scope, Preferences): Continue current care  Psycho-social/Spiritual:  Desire for further Chaplaincy support: Yes Additional Recommendations: Education on chronic disease burden   Prognosis: Limited overall.   Discharge Planning: Discharge  patient once medically optimized.  Vitals:   02/19/23 1950 02/20/23 0405  BP: (!) 142/68 122/71  Pulse: 79 84  Resp: 19 17  Temp: 97.9 F (36.6 C) 98.1 F (36.7 C)  SpO2: 96% 90%    Intake/Output Summary (Last 24 hours) at 02/20/2023 5409 Last data filed at 02/19/2023 1700 Gross per 24 hour  Intake 598 ml  Output 300 ml  Net 298 ml   Last Weight  Most recent update: 02/16/2023 12:20 PM    Weight  81.6 kg (180 lb)            Gen: Elderly Caucasian female in no acute distress HEENT: Dry mucous membranes CV: Regular rate and rhythm PULM: On room air breathing is even and nonlabored ABD: soft/nontender EXT: Left-sided hemiplegia Neuro: Alert and oriented x3  PPS:    This conversation/these recommendations were discussed with patient primary care team, Dr. Fatima Blank  Billing based on MDM: High ______________________________________________________ Lamarr Lulas [facility] Palliative Medicine Team Team Cell Phone: [phone] Please utilize secure chat with additional questions, if there is no response within 30 minutes please call the above phone number  Palliative Medicine Team providers are available by phone from 7am to 7pm daily and can be reached through the team cell phone.  Should this patient require assistance outside of these hours, please call the patient's attending physician.

## 2023-02-20 NOTE — Assessment & Plan Note (Addendum)
Mentation continues to improve. She is AAOx3.  This likely was in the setting of hypoglycemia upon arrival possibly due to diabetes regimen in the setting of decreased PO intake. - Delirium precautions - Bcx 1 of 4: Positive Staph Epi, suspect contaminant

## 2023-02-20 NOTE — Assessment & Plan Note (Addendum)
Acute swelling of RUE. Patient asymptomatic, without any erythema, warmth or pain upon palpation.  Suspect edema related to to radiation therapy.  - RUE [place] negative for DVT

## 2023-02-20 NOTE — Assessment & Plan Note (Addendum)
RVP positive for RSV.  Likely exposed at her ALF. - Supportive care - Tylenol PRN - Mucinex 600 mg BID

## 2023-02-20 NOTE — Plan of Care (Signed)

## 2023-02-20 NOTE — Progress Notes (Addendum)
Calorie Count Note  Patient endorses having little appetite. States her stomach feels bloated after eating, which is causing her to eat less of her meals. She has been receiving house trays, will place room service with assist.  Reports only drinking 1 Glucerna yesterday even though 2 are charted. Based on her calorie count she is doing fair with her PO intake, meeting 62% of energy and 56% of her protein needs. Will add snacks in between meals and add magic cups to meal trays to increase PO intake slightly more. No N/V noted. Had 2 loose BM yesterday.   48 hour calorie count ordered.  Diet: Dysphagia 3, thin liquids  Supplements: Glucerna  2/10 Lunch: 75% Veggie soup, meatloaf, Mac N cheese, broccoli, pears,  (406 kcal and 20 gm protein) Dinner: 50% potato soup, chicken, gravy, rice, green beans, roll, fruit cocktail (302 kcal, 19 gm protein) 2/11 Breakfast: 25% orange juice, banana, grits, scrambled eggs, potatoes, sausage (165 kcal, 5 gm protein) Supplements: 1 Glucerna shake (220 kcal and 10 gm protein)  24 hour Total intake: 1093 kcal (64% of minimum estimated needs)  54 gm protein (67% of minimum estimated needs)  2/11: Lunch: 25% pot roast, mashed potatoes, broccoli, angle food cake, peaches (254 kcal, 12 gm protein) Dinner: 100% grilled cheese sandwich, veggie soup (262 kcal, 8 gm protein) 2/12:  Breakfast 50% grape juice, applesauce, cream of wheat, french toast, sausage, (266 kcal, 6 gm protein) Supplements: 1 Glucerna (220 kcal, 10 gm protein)  24 hour Total intake: 1002 kcal (59% of minimum estimated needs)  36 gm protein (45% of minimum estimated needs)  48 hour average: 1047 kcal (62% of minimum estimated needs)  45 gm protein (56% of minimum estimated needs)   INTERVENTION:  Diet per SLP, encourage PO intake with assistance  Magic cup TID with meals, each supplement provides 290 kcal and 9 grams of protein Glucerna Shake po BID, each supplement provides 220 kcal  and 10 grams of protein  snacks MVI w/ minerals   NUTRITION DIAGNOSIS:  Moderate Malnutrition related to acute illness as evidenced by mild fat depletion, moderate muscle depletion, edema.   GOAL:  Patient will meet greater than or equal to 90% of their needs  Elliot Dally, RD Registered Dietitian  See Amion for more information

## 2023-02-21 DIAGNOSIS — E162 Hypoglycemia, unspecified: Secondary | ICD-10-CM | POA: Diagnosis not present

## 2023-02-21 LAB — GLUCOSE, CAPILLARY: Glucose-Capillary: 240 mg/dL — ABNORMAL HIGH (ref 70–99)

## 2023-02-21 NOTE — Assessment & Plan Note (Addendum)
GIPP positive for norovirus.  Denies any nausea or vomiting.  Diarrhea is improved and bowel movements have some consistency.  Endorses some abdominal pain at this time likely secondary to norovirus. - Encourage PO intake as tolerable - Continue to monitor for symptoms

## 2023-02-21 NOTE — Progress Notes (Signed)
Notified doctors at family medicine patients CBG was 313. They said no coverage for now and will check if it's still elevated in the morning.

## 2023-02-21 NOTE — Assessment & Plan Note (Addendum)
On admission pt presented w/ decreased PO intake and continuation of insulin at bedtime when BG > 140 nightly.  Home regimen includes 36 units of insulin at bedtime as needed, with sliding scale humalog during the day.  Per discussion with [facility] it seems she was been consistently getting insulin despite some hypoglycemic episodes - though this is the first time she has been symptomatic from it.  BG stable during hospitalization with no insulin.  Has limited PO intake at baseline.  Will discontinue diabetic regimen at discharge. - Fall precautions - PT/OT to treat - RD consulted - CBG seems to be stable during hospitalization, will discontinue diabetic regimen outpatient

## 2023-02-21 NOTE — TOC Progression Note (Addendum)
Transition of Care [facility]) - Progression Note    Patient Details  Name: Erin Hoffman MRN: [id number] Date of Birth: 1945/04/22  Transition of Care [facility]) CM/SW Contact  Delilah Shan, LCSWA Phone Number: 02/21/2023, 2:10 PM  Clinical Narrative:     Patient has SNF bed at [facility]. Insurance authorization went to a peer to peer. CSW awaiting determination from peer to peer. CSW will continue to follow and assist with patients dc planning needs.  MD informed CSW that peer to peer for SNF was denied. Patient and patients son Erin Hoffman informed . Patients son Erin Hoffman would like for CSW to look for LTC SNF bed for patient. Patients son informed CSW that patient can pay out of pocket for LTC bed. Patients son gave CSW permission to fax out patient for LTC SNF bed. Patients son would like for CSW to check with [facility] first and [facility]nd.All questions answered. No further questions reported at this time. CSW spoke with Star with [facility] to see if they can offer LTC bed for patient. Star is going to give CSW a call back tomorrow on if they can offer LTC bed for patient. CSW will continue to follow.  Update- Star [name] confirmed she can offer LTC bed for patient. She informed CSW that [place] with business office plans on calling patients son today to discuss LTC options for patient. CSW informed patients son. CSW will continue to follow.   Expected Discharge Plan: Skilled Nursing Facility Barriers to Discharge: Continued Medical Work up  Expected Discharge Plan and Services In-house Referral: Clinical Social Work     Living arrangements for the past 2 months:  (from [facility] short term)                                       Social Determinants of Health (SDOH) Interventions SDOH Screenings   Food Insecurity: No Food Insecurity (02/16/2023)  Housing: Unknown (02/16/2023)  Transportation Needs: No Transportation Needs (02/16/2023)  Utilities: Not At  Risk (02/16/2023)  Depression (PHQ2-9): Low Risk  (02/15/2023)  Social Connections: Unknown (02/16/2023)  Tobacco Use: Medium Risk (02/18/2023)    Readmission Risk Interventions     No data to display

## 2023-02-21 NOTE — Assessment & Plan Note (Addendum)
Mentation continues to improve. She is AAOx3.  This likely was in the setting of hypoglycemia upon arrival possibly due to diabetes regimen in the setting of decreased PO intake. - Delirium precautions - Bcx 1 of 4: Positive Staph Epi, suspect contaminant

## 2023-02-21 NOTE — Assessment & Plan Note (Addendum)
RD consulted. - Glucerna Shake BID between meals - Assist with feeding patient

## 2023-02-21 NOTE — Progress Notes (Signed)
Daily Progress Note Intern Pager: [phone]  Patient name: Erin Hoffman Medical record number: [id number] Date of birth: 1945-10-26 Age: 78 y.o. Gender: female  Primary Care Provider: [name], MD Consultants: RD Code Status: DNR/DNI  Pt Overview and Major Events to Date:  2/8: Admitted  Assessment and Plan: SHELBA SUSI is a 78 y.o. female with PMHx of metastatic cholangiocarcinoma, T2DM, HTN, hypothyroidism and A-fib presenting with AMS and hypoglycemia from ALF likely secondary to getting too much insulin in the setting of decreased appetite 2/2 RSV and Norovirus.  No longer hypoglycemic or altered.  Medically stable for discharge pending PT/OT eval for disposition.  Assessment & Plan Hypoglycemia On admission pt presented w/ decreased PO intake and continuation of insulin at bedtime when BG > 140 nightly.  Home regimen includes 36 units of insulin at bedtime as needed, with sliding scale humalog during the day.  Per discussion with [facility] it seems she was been consistently getting insulin despite some hypoglycemic episodes - though this is the first time she has been symptomatic from it.  BG stable during hospitalization with no insulin.  Has limited PO intake at baseline.  Will discontinue diabetic regimen at discharge. - Fall precautions - PT/OT to treat - RD consulted - CBG seems to be stable during hospitalization, will discontinue diabetic regimen outpatient AMS (altered mental status) Mentation continues to improve. She is AAOx3.  This likely was in the setting of hypoglycemia upon arrival possibly due to diabetes regimen in the setting of decreased PO intake - Delirium precautions - Bcx 1 of 4: Positive Staph Epi, suspect contaminant  RSV (respiratory syncytial virus infection) RVP positive for RSV.  Likely exposed at her ALF. - Supportive care - Tylenol PRN - Mucinex 600 mg BID  Norovirus GIPP positive for norovirus.  Denies any nausea or  vomiting.  Diarrhea is improved and bowel movements have some consistency.  Endorses some abdominal pain at this time likely secondary to norovirus. - Encourage PO intake as tolerable - Continue to monitor for symptoms Edema of right upper extremity Acute swelling of RUE. Patient asymptomatic, without any erythema, warmth or pain upon palpation.  Suspect edema related to to radiation therapy.  - RUE [place] negative for DVT  Malnutrition of moderate degree RD consulted. - Glucerna Shake BID between meals - Assist with feeding patient  Chronic and Stable Problems: Metastatic cholangiocarcinoma: Follows with [facility] health hematology/oncology Dr. Arlana Pouch.  Last seen 2/7.  Previously treated with cisplatin, gemcitabine, and durvalumab in 11/2022.  Uses oxycodone and fentanyl patch for pain.  Continue fentanyl patch here to prevent withdrawal.  Oxycodone 5 mg q6h as needed.  Will discuss further with family members to discuss engaging in Palliative services.  T2DM: Home regimen includes insulin 36 units nightly as needed based on elevated blood glucoses (>140 per facility).  Discontinued glimepiride 4 mg BID, metformin 500 mg BID.  Will discontinue all diabetic regimen at discharge. HTN: Soft/normotensive pressures. Holding home medications at this time. Hypothyroidism: Continue Synthroid 137 mcg daily. A-fib: Not on Eliquis, therefore higher risk of stroke.  CT head negative. GERD: Home Protonix 40 mg daily  HLD: Continue Crestor 20 mg daily  FEN/GI: Regular diet PPx: Lovenox Dispo: Pending P2P  Subjective:  Patient is doing well this morning.  Her only concern this morning is having some crampy abdominal pain which she reports has been present for about a week, but has not previously mentioned.  Likely secondary to her norovirus.  Discussed  with nursing to assist with feeds as able, appreciate their care.  Nursing shared that her BMs are still diarrheal, but have more consistency.  Objective: Temp:   [97.8 F (36.6 C)-98.2 F (36.8 C)] 97.8 F (36.6 C) (02/13 0742) Pulse Rate:  [75-83] 75 (02/13 0742) Resp:  [15-19] 16 (02/13 0742) BP: (123-128)/(65-68) 123/68 (02/13 0519) SpO2:  [93 %-96 %] 93 % (02/13 0742) Physical Exam: General: Laying in bed comfortably NAD. Cardiovascular: RRR.  No M/R/G. Respiratory: CTAB.  Normal WOB on RA. Abdomen: Soft, nontender, nondistended.  Normoactive bowel sounds. 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 No BLE edema. No deformities noted.   Laboratory: Most recent CBC Lab Results  Component Value Date   WBC 9.7 02/19/2023   HGB 10.1 (L) 02/19/2023   HCT 30.9 (L) 02/19/2023   MCV 91.4 02/19/2023   PLT 241 02/19/2023   Most recent BMP    Latest Ref Rng & Units 02/19/2023    4:56 AM  BMP  Glucose 70 - 99 mg/dL 161   BUN 8 - 23 mg/dL 19   Creatinine 0.96 - 1.00 mg/dL 0.45   Sodium 409 - 811 mmol/L 135   Potassium 3.5 - 5.1 mmol/L 4.1   Chloride 98 - 111 mmol/L 100   CO2 22 - 32 mmol/L 22   Calcium 8.9 - 10.3 mg/dL 8.8    Imaging/Diagnostic Tests: No new imaging.  Fortunato Curling, DO 02/21/2023, 11:41 AM  PGY-1, [facility] FPTS Intern pager: [phone], text pages welcome Secure chat group [facility] Teaching Service

## 2023-02-21 NOTE — Assessment & Plan Note (Addendum)
Acute swelling of RUE. Patient asymptomatic, without any erythema, warmth or pain upon palpation.  Suspect edema related to to radiation therapy.  - RUE [place] negative for DVT

## 2023-02-21 NOTE — Plan of Care (Addendum)
Attempted to call peer to peer x 2 and left voicemail with our phone number, prior to deadline.  Social work also attempted, but went to [employer] as well.  Social work contacted [employer] to let them know that we had called.  Have not received a call back yet, currently past peer to peer deadline.  Fortunato Curling, DO [facility] Family [facility], PGY-1 02/21/23 12:47 PM  Service pager [phone]  Addendum 1445: Spoke with Dr. Jacob Moores for peer to peer regarding SNF authorization.  Dr. Logan Bores reviewed patient's clinical course at SNF, states she was admitted at [facility] on 12/11 and had been there until hospital stay on 12/8.  He reports she has made no improvements during that time and he has had multiple discussions with the son regarding lack of progress.  Reviewed PT note inpatient, reports that it is the same as her last SNF mobility note and therefore the authorization is declined.  States the son can appeal if desired.  Elberta Fortis, MD 02/21/2023, 2:44 PM PGY-2, [facility] Family Medicine Service pager [phone]

## 2023-02-21 NOTE — Assessment & Plan Note (Addendum)
RVP positive for RSV.  Likely exposed at her ALF. - Supportive care - Tylenol PRN - Mucinex 600 mg BID

## 2023-02-22 LAB — CULTURE, BLOOD (ROUTINE X 2)
Culture  Setup Time: NO GROWTH
Special Requests: ADEQUATE

## 2023-02-22 LAB — GLUCOSE, CAPILLARY: Glucose-Capillary: 277 mg/dL — ABNORMAL HIGH (ref 70–99)

## 2023-02-22 NOTE — TOC Progression Note (Signed)
Transition of Care [facility]) - Progression Note    Patient Details  Name: Erin Hoffman MRN: [id number] Date of Birth: May 03, 1945  [facility] CM/SW Contact  Delilah Shan, LCSWA Phone Number: 02/22/2023, 11:27 AM  Clinical Narrative:     Patient has decided to appeal the SNF decision. Patients son Genevie Cheshire is going to start appeal. CSW spoke with Judeth Cornfield with [employer] who gave CSW appeal number for patients son billy to call. Tele# [phone]. CSW provided telephone number to patients son Genevie Cheshire to call start appeal. CSW will continue to follow and assist with patients dc planning needs.  Expected Discharge Plan: Skilled Nursing Facility Barriers to Discharge: Continued Medical Work up  Expected Discharge Plan and Services In-house Referral: Clinical Social Work     Living arrangements for the past 2 months:  (from [facility] short term)                                       Social Determinants of Health (SDOH) Interventions SDOH Screenings   Food Insecurity: No Food Insecurity (02/16/2023)  Housing: Unknown (02/16/2023)  Transportation Needs: No Transportation Needs (02/16/2023)  Utilities: Not At Risk (02/16/2023)  Depression (PHQ2-9): Low Risk  (02/15/2023)  Social Connections: Unknown (02/16/2023)  Tobacco Use: Medium Risk (02/18/2023)    Readmission Risk Interventions     No data to display

## 2023-02-22 NOTE — Progress Notes (Signed)
Physical Therapy Treatment Patient Details Name: Erin Hoffman MRN: [id number] DOB: 1945-11-02 Today's Date: 02/22/2023   History of Present Illness Erin Hoffman is a 78 y.o. female presenting 2/8 with AMS and hypoglycemia from SNF likely secondary to getting too much insulin in the setting of decreased appetite 2/2 RSV and Noroviruswith.  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 Pt with somewhat improved sitting balance today overall with ability to sit with CGA for several minutes with cues. Pt fatigues quickly. Will continue to work with pt acutely and progress pt as able.  Pt currently with functional limitations due to the deficits listed below (see PT Problem List). Pt will benefit from acute skilled PT to increase their independence and safety with mobility to allow discharge.       If plan is discharge home, recommend the following: Two people to help with walking and/or transfers;Two people to help with bathing/dressing/bathroom;Assistance with cooking/housework;Assistance with feeding;Assist for transportation;Help with stairs or ramp for entrance;Supervision due to cognitive status   Can travel by private vehicle     No  Equipment Recommendations  None recommended by PT    Recommendations for Other Services       Precautions / Restrictions Precautions Precautions: Fall Restrictions Weight Bearing Restrictions Per Provider Order: No     Mobility  Bed Mobility Overal bed mobility: Needs Assistance Bed Mobility: Supine to Sit, Sit to Supine     Supine to sit: HOB elevated, Used rails, Max assist, +2 for physical assistance Sit to supine: Max assist, +2 for physical assistance, Used rails   General bed mobility comments: Pt needed max assist of 2 to come to EOB with assist for LEs and trunk with pt able to assist 25%.    Transfers                    General transfer comment: NT    Ambulation/Gait               General Gait Details: Pt hasnt walked since CVA in Nov   Stairs             Wheelchair Mobility     Tilt Bed    Modified Rankin (Stroke Patients Only) Modified Rankin (Stroke Patients Only) Pre-Morbid Rankin Score: Severe disability Modified Rankin: Severe disability     Balance Overall balance assessment: Needs assistance Sitting-balance support: Feet supported, Single extremity supported, No upper extremity supported Sitting balance-Leahy Scale: Fair Sitting balance - Comments: Needed mod to CGA assist to sit up on EOB for 7 minutes varying with support needed. Pt leans posterior and to left as she fatigues. Pt responds well to correcting posture and sitting balance to verbal and tactile cues. As she fatigued, needed more assist.  Total assist of 2 to scoot pt to right with pad use to position for lying down. Postural control: Posterior lean, Left lateral lean, Right lateral lean                                  Communication Communication Communication: No apparent difficulties  Cognition Arousal: Alert Behavior During Therapy: Flat affect       Orientation impairments: Time                   PT - Cognition Comments: Pt follows one step commands  consistently. Aware of deficits. Following commands: Intact      Cueing Cueing Techniques: Gestural cues, Verbal cues, Tactile cues, Visual cues  Exercises General Exercises - Lower Extremity Ankle Circles/Pumps: PROM, Left, 5 reps, Supine Heel Slides: PROM, Left, 5 reps, Supine Hip Flexion/Marching: PROM, Left, 5 reps, Supine    General Comments General comments (skin integrity, edema, etc.): Bil UE edema with left UE propped on pillow pre and post PT evaluation.      Pertinent Vitals/Pain Pain Assessment Pain Assessment: Faces Faces Pain Scale: Hurts whole lot Pain Location: back and left leg Pain Descriptors /  Indicators: Aching, Grimacing, Guarding, Discomfort Pain Intervention(s): Limited activity within patient's tolerance, Monitored during session, Repositioned    Home Living                          Prior Function            PT Goals (current goals can now be found in the care plan section) Acute Rehab PT Goals Patient Stated Goal: to get stronger Progress towards PT goals: Progressing toward goals    Frequency    Min 1X/week      PT Plan      Co-evaluation              AM-PAC PT "6 Clicks" Mobility   Outcome Measure  Help needed turning from your back to your side while in a flat bed without using bedrails?: Total Help needed moving from lying on your back to sitting on the side of a flat bed without using bedrails?: Total Help needed moving to and from a bed to a chair (including a wheelchair)?: Total Help needed standing up from a chair using your arms (e.g., wheelchair or bedside chair)?: Total Help needed to walk in hospital room?: Total Help needed climbing 3-5 steps with a railing? 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

## 2023-02-22 NOTE — Assessment & Plan Note (Addendum)
GIPP positive for norovirus.  Denies any nausea or vomiting.  Diarrhea is improved and bowel movements have some consistency. - Encourage PO intake as tolerable - Continue to monitor for symptoms

## 2023-02-22 NOTE — Assessment & Plan Note (Addendum)
On admission pt presented w/ decreased PO intake and continuation of insulin at bedtime when BG > 140 nightly.  Home regimen includes 36 units of insulin at bedtime as needed, with sliding scale humalog during the day.  Per discussion with [facility] it seems she was been consistently getting insulin despite some hypoglycemic episodes - though this is the first time she has been symptomatic from it.  BG stable during hospitalization with no insulin.  Has limited PO intake at baseline.  Will likely discontinue diabetic regimen at discharge.  PT recommended SNF, however peer-to-peer was declined.  Social work working with son Genevie Cheshire to appeal SNF decision. - Fall precautions - RD consulted - CBG seems to be stable during hospitalization, will likely discontinue diabetic regimen outpatient

## 2023-02-22 NOTE — Assessment & Plan Note (Addendum)
Mentation continues to improve. She is AAOx3.  This likely was in the setting of hypoglycemia upon arrival possibly due to diabetes regimen in the setting of decreased PO intake. - Delirium precautions - Bcx 1 of 4: Positive Staph Epi, suspect contaminant

## 2023-02-22 NOTE — Assessment & Plan Note (Addendum)
RVP positive for RSV.  Likely exposed at her ALF.  No respiratory symptoms, on room air. - Supportive care - Tylenol PRN - Mucinex 600 mg BID

## 2023-02-22 NOTE — Plan of Care (Signed)

## 2023-02-22 NOTE — Care Management Important Message (Signed)
Important Message  Patient Details  Name: Erin Hoffman MRN: [id number] Date of Birth: [birth year]   Important Message Given:  Yes - Medicare IM     Renie Ora 02/22/2023, 11:26 AM

## 2023-02-22 NOTE — Progress Notes (Signed)
Daily Progress Note Intern Pager: [phone]  Patient name: Erin Hoffman Medical record number: [id number] Date of birth: Jul 14, 1945 Age: 78 y.o. Gender: female  Primary Care Provider: [name], MD Consultants: RD  Code Status: DNR/DNI  Pt Overview and Major Events to Date:  2/8: Admitted  Assessment and Plan: Erin Hoffman is a 78 y.o. female with PMHx of metastatic cholangiocarcinoma, T2DM, HTN, hypothyroidism and A-fib presenting with AMS and hypoglycemia from ALF likely secondary to getting too much insulin in the setting of decreased appetite 2/2 RSV and Norovirus.  No longer hypoglycemic or altered.  Medically stable for discharge pending PT/OT eval for disposition.  Assessment & Plan Hypoglycemia On admission pt presented w/ decreased PO intake and continuation of insulin at bedtime when BG > 140 nightly.  Home regimen includes 36 units of insulin at bedtime as needed, with sliding scale humalog during the day.  [facility] it seems she was been consistently getting insulin despite some hypoglycemic episodes - though this is the first time she has been symptomatic from it.  BG stable during hospitalization with no insulin.  Has limited PO intake at baseline.  Will likely discontinue diabetic regimen at discharge.  PT recommended SNF, however peer-to-peer was declined.  Social work working with son Erin Hoffman to appeal SNF decision. - Fall precautions - RD consulted - CBG seems to be stable during hospitalization, will likely discontinue diabetic regimen outpatient AMS (altered mental status) Mentation continues to improve. She is AAOx3.  This likely was in the setting of hypoglycemia upon arrival possibly due to diabetes regimen in the setting of decreased PO intake. - Delirium precautions - Bcx 1 of 4: Positive Staph Epi, suspect contaminant  RSV (respiratory syncytial virus infection) RVP positive for RSV.  Likely exposed at her ALF.  No  respiratory symptoms, on room air. - Supportive care - Tylenol PRN - Mucinex 600 mg BID  Norovirus GIPP positive for norovirus.  Denies any nausea or vomiting.  Diarrhea is improved and bowel movements have some consistency. - Encourage PO intake as tolerable - Continue to monitor for symptoms Edema of right upper extremity Acute swelling of RUE. Patient asymptomatic, without any erythema, warmth or pain upon palpation.  Suspect edema related to to radiation therapy.  - RUE [place] negative for DVT  Malnutrition of moderate degree RD consulted. - Glucerna Shake BID between meals - Assist with feeding patient  Chronic and Stable Problems: Metastatic cholangiocarcinoma: Follows with [facility] health hematology/oncology Dr. Arlana Pouch.  Last seen 2/7.  Previously treated with cisplatin, gemcitabine, and durvalumab in 11/2022.  Uses oxycodone and fentanyl patch for pain.  Continue fentanyl patch here to prevent withdrawal.  Oxycodone 5 mg q6h as needed.  Will discuss further with family members to discuss engaging in Palliative services.  T2DM: Home regimen includes insulin 36 units nightly as needed based on elevated blood glucoses (>140 per facility).  Discontinued glimepiride 4 mg BID, metformin 500 mg BID.  Will discontinue all diabetic regimen at discharge. HTN: Soft/normotensive pressures. Holding home medications at this time. Hypothyroidism: Continue Synthroid 137 mcg daily. A-fib: Not on Eliquis, therefore higher risk of stroke.  CT head negative. GERD: Home Protonix 40 mg daily  HLD: Continue Crestor 20 mg daily  FEN/GI: Regular diet PPx: Lovenox Dispo: Pending dispo plans  Subjective:  Patient is doing well this morning.  Laying comfortably in bed.  No concerns today.  Objective: Temp:  [97.6 F (36.4 C)-98.3 F (36.8 C)] 97.8  F (36.6 C) (02/14 0741) Pulse Rate:  [73-85] 82 (02/14 0741) Resp:  [16-18] 18 (02/14 0741) BP: (112-136)/(51-71) 120/51 (02/14 0741) SpO2:  [96 %-97 %] 97 %  (02/14 0741) Physical Exam: General: Laying in bed comfortably NAD. Cardiovascular: RRR.  No M/R/G. Respiratory: CTAB.  Normal WOB on RA. Abdomen: Soft, nontender, nondistended.  Normoactive bowel sounds. 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 No BLE edema. No deformities noted.   Laboratory: Most recent CBC Lab Results  Component Value Date   WBC 9.7 02/19/2023   HGB 10.1 (L) 02/19/2023   HCT 30.9 (L) 02/19/2023   MCV 91.4 02/19/2023   PLT 241 02/19/2023   Most recent BMP    Latest Ref Rng & Units 02/19/2023    4:56 AM  BMP  Glucose 70 - 99 mg/dL 098   BUN 8 - 23 mg/dL 19   Creatinine 1.19 - 1.00 mg/dL 1.47   Sodium 829 - 562 mmol/L 135   Potassium 3.5 - 5.1 mmol/L 4.1   Chloride 98 - 111 mmol/L 100   CO2 22 - 32 mmol/L 22   Calcium 8.9 - 10.3 mg/dL 8.8    Imaging/Diagnostic Tests: No new imaging.  Fortunato Curling, DO 02/22/2023, 12:46 PM  PGY-1, [facility] Family Medicine FPTS Intern pager: [phone], text pages welcome Secure chat group [facility] Teaching Service

## 2023-02-22 NOTE — Assessment & Plan Note (Addendum)
RD consulted. - Glucerna Shake BID between meals - Assist with feeding patient

## 2023-02-22 NOTE — Plan of Care (Signed)

## 2023-02-22 NOTE — Progress Notes (Signed)
PT Cancellation Note  Patient Details Name: Erin Hoffman MRN: [id number] DOB: 05/26/1945   Cancelled Treatment:    Reason Eval/Treat Not Completed: Other (comment) (NT feeding pt. Will try to return later.)   [name] 02/22/2023, 2:11 PM Almin Livingstone M,PT Acute Rehab Services [phone]

## 2023-02-22 NOTE — Assessment & Plan Note (Addendum)
Acute swelling of RUE. Patient asymptomatic, without any erythema, warmth or pain upon palpation.  Suspect edema related to to radiation therapy.  - RUE [place] negative for DVT

## 2023-02-23 LAB — GLUCOSE, CAPILLARY
Glucose-Capillary: 317 mg/dL — ABNORMAL HIGH (ref 70–99)
Glucose-Capillary: 245 mg/dL — ABNORMAL HIGH (ref 70–99)

## 2023-02-23 MED ORDER — INSULIN ASPART 100 UNIT/ML IJ SOLN
0.0000 [IU] | Freq: Three times a day (TID) | INTRAMUSCULAR | Status: DC
  Administered 2023-02-24: 2 [IU] via SUBCUTANEOUS
  Administered 2023-02-25: 3 [IU] via SUBCUTANEOUS
  Administered 2023-02-25: 2 [IU] via SUBCUTANEOUS
  Administered 2023-02-25: 7 [IU] via SUBCUTANEOUS
  Administered 2023-02-26: 2 [IU] via SUBCUTANEOUS
  Administered 2023-02-26: 3 [IU] via SUBCUTANEOUS
  Administered 2023-02-26: 5 [IU] via SUBCUTANEOUS
  Administered 2023-02-27 (×2): 2 [IU] via SUBCUTANEOUS
  Administered 2023-02-27: 3 [IU] via SUBCUTANEOUS
  Administered 2023-02-28: 2 [IU] via SUBCUTANEOUS
  Administered 2023-02-28: 1 [IU] via SUBCUTANEOUS
  Administered 2023-02-28 – 2023-03-01 (×2): 2 [IU] via SUBCUTANEOUS
  Administered 2023-03-01: 1 [IU] via SUBCUTANEOUS
  Administered 2023-03-01: 2 [IU] via SUBCUTANEOUS

## 2023-02-23 MED ORDER — INSULIN ASPART 100 UNIT/ML IJ SOLN
0.0000 [IU] | Freq: Three times a day (TID) | INTRAMUSCULAR | Status: DC
  Administered 2023-02-23: 7 [IU] via SUBCUTANEOUS

## 2023-02-23 MED ORDER — INSULIN ASPART 100 UNIT/ML IJ SOLN
0.0000 [IU] | Freq: Every day | INTRAMUSCULAR | Status: DC
  Administered 2023-02-23: 2 [IU] via SUBCUTANEOUS
  Administered 2023-02-26: 3 [IU] via SUBCUTANEOUS

## 2023-02-23 NOTE — Progress Notes (Signed)
 CBG 245. Does not have HS coverage ordered. Notified on call intern, Dr. Samara Deist via text page. See new orders.

## 2023-02-23 NOTE — Assessment & Plan Note (Signed)
 Acute swelling of RUE, Suspect edema related to to radiation therapy.  - CTM

## 2023-02-23 NOTE — Assessment & Plan Note (Addendum)
 GIPP positive for norovirus.  Per nursing, had 2 small episodes of loose stool ON. - Encourage PO intake as tolerable - Continue to monitor for symptoms

## 2023-02-23 NOTE — Assessment & Plan Note (Signed)
 RD consulted. - Glucerna Shake BID between meals - Assist with feeding patient

## 2023-02-23 NOTE — Assessment & Plan Note (Signed)
 RVP positive for RSV.  Likely exposed at her ALF.  No respiratory symptoms, on room air. - Supportive care - Tylenol PRN - Mucinex 600 mg BID

## 2023-02-23 NOTE — Assessment & Plan Note (Signed)
 Improved as hypoglycemia was treated.  - Delirium precautions

## 2023-02-23 NOTE — Assessment & Plan Note (Signed)
 Now stable and not requiring insulin. Pending dispo plans.  - CBG BID

## 2023-02-23 NOTE — Progress Notes (Addendum)
 Daily Progress Note Intern Pager: [phone]  Patient name: Erin Hoffman Medical record number: [id number] Date of birth: October 22, 1945 Age: 78 y.o. Gender: female  Primary Care Provider: [name], MD Consultants: RD  Code Status: DNR/DNI  Pt Overview and Major Events to Date:  2/8: Admitted   Assessment and Plan: Erin Hoffman is a 78 y.o. female with PMHx of metastatic cholangiocarcinoma, T2DM, HTN, hypothyroidism and A-fib presenting with AMS and hypoglycemia from ALF likely secondary to getting too much insulin in the setting of decreased appetite 2/2 RSV and Norovirus. No longer hypoglycemic or altered. Medically stable for discharge pending placement. Assessment & Plan Hypoglycemia Now stable and not requiring insulin. Pending dispo plans.  - CBG BID Norovirus GIPP positive for norovirus.  Per nursing, had 2 small episodes of loose stool ON. - Encourage PO intake as tolerable - Continue to monitor for symptoms Edema of right upper extremity Acute swelling of RUE, Suspect edema related to to radiation therapy.  - CTM Malnutrition of moderate degree RD consulted. - Glucerna Shake BID between meals - Assist with feeding patient AMS (altered mental status) (Resolved: 02/23/2023) Improved as hypoglycemia was treated.  - Delirium precautions RSV (respiratory syncytial virus infection) (Resolved: 02/23/2023) RVP positive for RSV.  Likely exposed at her ALF.  No respiratory symptoms, on room air. - Supportive care - Tylenol PRN - Mucinex 600 mg BID     Chronic and Stable Problems:  Metastatic cholangiocarcinoma: Follows with [facility] health hematology/oncology Dr. Arlana Pouch.  Last seen 2/7.  Previously treated with cisplatin, gemcitabine, and durvalumab in 11/2022.  Uses oxycodone and fentanyl patch for pain.  Continue fentanyl patch here to prevent withdrawal.  Oxycodone 5 mg q6h as needed.  Will discuss further with family members to discuss engaging in Palliative  services.  T2DM: Home regimen includes insulin 36 units nightly as needed based on elevated blood glucoses (>140 per facility).  Discontinued glimepiride 4 mg BID, metformin 500 mg BID.  Will discontinue all diabetic regimen at discharge. HTN: Soft/normotensive pressures. Holding home medications at this time. Hypothyroidism: Continue Synthroid 137 mcg daily. A-fib: Not on Eliquis, therefore higher risk of stroke.  CT head negative. GERD: Home Protonix 40 mg daily  HLD: Continue Crestor 20 mg daily   FEN/GI: Regular PPx: Lovenox Dispo: Medically stable for discharge.  Pending dispo  Subjective:  No acute events overnight.  Objective: Temp:  [97.5 F (36.4 C)-98.2 F (36.8 C)] 98.2 F (36.8 C) (02/15 0352) Pulse Rate:  [79-90] 81 (02/15 0352) Resp:  [18] 18 (02/15 0352) BP: (120-145)/(51-70) 129/64 (02/15 0352) SpO2:  [95 %-97 %] 97 % (02/15 0352) Physical Exam: General: Alert, pleasant woman laying in bed.  NAD. Cardiovascular: RRR Respiratory: CTAB.  Normal work of breathing on room air. Abdomen: Soft, nontender  Laboratory: Most recent CBC Lab Results  Component Value Date   WBC 9.7 02/19/2023   HGB 10.1 (L) 02/19/2023   HCT 30.9 (L) 02/19/2023   MCV 91.4 02/19/2023   PLT 241 02/19/2023   Most recent BMP    Latest Ref Rng & Units 02/19/2023    4:56 AM  BMP  Glucose 70 - 99 mg/dL 564   BUN 8 - 23 mg/dL 19   Creatinine 3.32 - 1.00 mg/dL 9.51   Sodium 884 - 166 mmol/L 135   Potassium 3.5 - 5.1 mmol/L 4.1   Chloride 98 - 111 mmol/L 100   CO2 22 - 32 mmol/L 22   Calcium 8.9 - 10.3 mg/dL  8.8    Lincoln Brigham, MD 02/23/2023, 4:47 AM  PGY-2, [facility] Family Medicine FPTS Intern pager: [phone], text pages welcome Secure chat group [facility] Teaching Service

## 2023-02-24 DIAGNOSIS — E162 Hypoglycemia, unspecified: Secondary | ICD-10-CM | POA: Diagnosis not present

## 2023-02-24 LAB — GLUCOSE, CAPILLARY
Glucose-Capillary: 112 mg/dL — ABNORMAL HIGH (ref 70–99)
Glucose-Capillary: 170 mg/dL — ABNORMAL HIGH (ref 70–99)
Glucose-Capillary: 184 mg/dL — ABNORMAL HIGH (ref 70–99)
Glucose-Capillary: 193 mg/dL — ABNORMAL HIGH (ref 70–99)

## 2023-02-24 MED ORDER — LOPERAMIDE HCL 2 MG PO CAPS: 2.0000 mg | ORAL_CAPSULE | ORAL | Status: DC | PRN

## 2023-02-24 NOTE — Assessment & Plan Note (Signed)
 Now stable and is on SSI, received 9 units yesterday. Pending dispo plans.  - CBG BID - SSI

## 2023-02-24 NOTE — Assessment & Plan Note (Signed)
 RD consulted. - Glucerna Shake BID between meals - Assist with feeding patient

## 2023-02-24 NOTE — Assessment & Plan Note (Signed)
 GIPP positive for norovirus.   - Encourage PO intake as tolerable - Continue to monitor for symptoms

## 2023-02-24 NOTE — Assessment & Plan Note (Signed)
 Acute swelling of RUE, Suspect edema related to to radiation therapy.  - CTM

## 2023-02-24 NOTE — Progress Notes (Signed)
     Daily Progress Note Intern Pager: [phone]  Patient name: Erin Hoffman Medical record number: [id number] Date of birth: 07-Dec-1945 Age: 78 y.o. Gender: female  Primary Care Provider: [name], MD Consultants: RD Code Status: DNR/DNI  Pt Overview and Major Events to Date:  2/8: Admitted   Assessment and Plan:  Erin Hoffman is a 78 y.o. female with PMHx of metastatic cholangiocarcinoma, T2DM, HTN, hypothyroidism and A-fib presenting with AMS and hypoglycemia from ALF likely secondary to getting too much insulin in the setting of decreased appetite 2/2 RSV and Norovirus. No longer hypoglycemic or altered. Medically stable for discharge pending placement.  Assessment & Plan Hypoglycemia Now stable and is on SSI, received 9 units yesterday. Pending dispo plans.  - CBG BID - SSI Norovirus GIPP positive for norovirus.   - Encourage PO intake as tolerable - Continue to monitor for symptoms Edema of right upper extremity Acute swelling of RUE, Suspect edema related to to radiation therapy.  - CTM Malnutrition of moderate degree RD consulted. - Glucerna Shake BID between meals - Assist with feeding patient   Chronic and Stable Issues: Metastatic cholangiocarcinoma: Follows with [facility] health hematology/oncology Dr. Arlana Pouch.  Last seen 2/7.  Previously treated with cisplatin, gemcitabine, and durvalumab in 11/2022.  Uses oxycodone and fentanyl patch for pain.  Continue fentanyl patch here to prevent withdrawal.  Oxycodone 5 mg q6h as needed.  Will discuss further with family members to discuss engaging in Palliative services.  T2DM: Home regimen includes insulin 36 units nightly as needed based on elevated blood glucoses (>140 per facility).  Discontinued glimepiride 4 mg BID, metformin 500 mg BID.  Will discontinue all diabetic regimen at discharge. HTN: Soft/normotensive pressures. Holding home medications at this time. Hypothyroidism: Continue Synthroid 137 mcg  daily. A-fib: Not on Eliquis, therefore higher risk of stroke.  CT head negative. GERD: Home Protonix 40 mg daily  HLD: Continue Crestor 20 mg daily  FEN/GI: Regular PPx: Lovenox Dispo: Medically stable for discharge  Subjective:  Doing well this morning, had two loose stools overnight  Objective: Temp:  [97.7 F (36.5 C)-98.2 F (36.8 C)] 98 F (36.7 C) (02/16 0456) Pulse Rate:  [76-88] 84 (02/16 0456) Resp:  [15-18] 15 (02/16 0456) BP: (127-133)/(59-71) 133/63 (02/16 0456) SpO2:  [92 %-99 %] 94 % (02/16 0456) Physical Exam: General: NAD, pleasant Cardiovascular: RRR, NRMG Respiratory: CTABL, normal WOB Abdomen: Soft, non-tender, non-distended   Laboratory: Most recent CBC Lab Results  Component Value Date   WBC 9.7 02/19/2023   HGB 10.1 (L) 02/19/2023   HCT 30.9 (L) 02/19/2023   MCV 91.4 02/19/2023   PLT 241 02/19/2023   Most recent BMP    Latest Ref Rng & Units 02/19/2023    4:56 AM  BMP  Glucose 70 - 99 mg/dL 130   BUN 8 - 23 mg/dL 19   Creatinine 8.65 - 1.00 mg/dL 7.84   Sodium 696 - 295 mmol/L 135   Potassium 3.5 - 5.1 mmol/L 4.1   Chloride 98 - 111 mmol/L 100   CO2 22 - 32 mmol/L 22   Calcium 8.9 - 10.3 mg/dL 8.8      Bess Kinds, MD 02/24/2023, 6:07 AM  PGY-3, [place] [facility] FPTS Intern pager: [phone], text pages welcome Secure chat group [facility] Teaching Service

## 2023-02-24 NOTE — Plan of Care (Signed)

## 2023-02-24 NOTE — Plan of Care (Signed)
  Problem: Education: Goal: Knowledge of General Education information will improve Description: Including pain rating scale, medication(s)/side effects and non-pharmacologic comfort measures Outcome: Progressing   Problem: Clinical Measurements: Goal: Respiratory complications will improve Outcome: Progressing   Problem: Activity: Goal: Risk for activity intolerance will decrease Outcome: Not Progressing   Problem: Nutrition: Goal: Adequate nutrition will be maintained Outcome: Progressing   Problem: Elimination: Goal: Will not experience complications related to bowel motility Outcome: Progressing   Problem: Pain Managment: Goal: General experience of comfort will improve and/or be controlled Outcome: Progressing

## 2023-02-25 ENCOUNTER — Other Ambulatory Visit: Payer: Self-pay | Admitting: Nurse Practitioner

## 2023-02-25 DIAGNOSIS — G893 Neoplasm related pain (acute) (chronic): Secondary | ICD-10-CM | POA: Diagnosis not present

## 2023-02-25 DIAGNOSIS — E162 Hypoglycemia, unspecified: Secondary | ICD-10-CM | POA: Diagnosis not present

## 2023-02-25 DIAGNOSIS — Z515 Encounter for palliative care: Secondary | ICD-10-CM | POA: Diagnosis not present

## 2023-02-25 LAB — CBC
HCT: 34.1 % — ABNORMAL LOW (ref 36.0–46.0)
Hemoglobin: 11.3 g/dL — ABNORMAL LOW (ref 12.0–15.0)
MCH: 30.3 pg (ref 26.0–34.0)
MCHC: 33.1 g/dL (ref 30.0–36.0)
MCV: 91.4 fL (ref 80.0–100.0)
Platelets: 347 10*3/uL (ref 150–400)
RBC: 3.73 MIL/uL — ABNORMAL LOW (ref 3.87–5.11)
RDW: 16.8 % — ABNORMAL HIGH (ref 11.5–15.5)
WBC: 13.6 10*3/uL — ABNORMAL HIGH (ref 4.0–10.5)
nRBC: 0 % (ref 0.0–0.2)

## 2023-02-25 LAB — GLUCOSE, CAPILLARY
Glucose-Capillary: 195 mg/dL — ABNORMAL HIGH (ref 70–99)
Glucose-Capillary: 323 mg/dL — ABNORMAL HIGH (ref 70–99)
Glucose-Capillary: 205 mg/dL — ABNORMAL HIGH (ref 70–99)
Glucose-Capillary: 207 mg/dL — ABNORMAL HIGH (ref 70–99)
Glucose-Capillary: 144 mg/dL — ABNORMAL HIGH (ref 70–99)

## 2023-02-25 MED ORDER — PROSOURCE PLUS PO LIQD
30.0000 mL | Freq: Every day | ORAL | Status: DC
  Administered 2023-02-28: 30 mL via ORAL
  Filled 2023-02-25 (×2): qty 30

## 2023-02-25 NOTE — Plan of Care (Signed)

## 2023-02-25 NOTE — Assessment & Plan Note (Signed)
 RD consulted. - Glucerna Shake BID between meals - Assist with feeding patient

## 2023-02-25 NOTE — Assessment & Plan Note (Addendum)
 Unchanged, suspect radiation therapy related - CTM

## 2023-02-25 NOTE — Plan of Care (Signed)
  Problem: Clinical Measurements: Goal: Respiratory complications will improve Outcome: Progressing Goal: Cardiovascular complication will be avoided Outcome: Progressing   Problem: Nutrition: Goal: Adequate nutrition will be maintained Outcome: Not Progressing   Problem: Elimination: Goal: Will not experience complications related to bowel motility Outcome: Progressing   Problem: Pain Managment: Goal: General experience of comfort will improve and/or be controlled Outcome: Progressing   Problem: Safety: Goal: Ability to remain free from injury will improve Outcome: Progressing

## 2023-02-25 NOTE — Progress Notes (Signed)
 Nutrition Follow-up  DOCUMENTATION CODES:   Non-severe (moderate) malnutrition in context of acute illness/injury  INTERVENTION:  Diet per SLP, encourage PO intake with assistance  Magic cup TID with meals, each supplement provides 290 kcal and 9 grams of protein Glucerna Shake po BID, each supplement provides 220 kcal and 10 grams of protein  snacks MVI w/ minerals 30 ml ProSource Plus QHS, each supplement provides 100 kcals and 15 grams protein.   Measure weight to assess trend  NUTRITION DIAGNOSIS:  Moderate Malnutrition related to acute illness as evidenced by mild fat depletion, moderate muscle depletion, edema. - remains applicable  GOAL:  Patient will meet greater than or equal to 90% of their needs - progressing  MONITOR:  PO intake, Supplement acceptance, Labs, Weight trends  REASON FOR ASSESSMENT:  Consult Assessment of nutrition requirement/status, Calorie Count  ASSESSMENT:   Pt is 78y.o female admitted for AMS/hypoglycemic event. PMH: metastatic cholangiocarcinoma, T2DM, HTN, Afib, hypothyroidism.  Pt is medically stable for discharge pending SNF placement. Family currently appealing denial from insurance.   Intake decreased compared to admission note and calorie count, averaging 38% x6 documented meals. Continue to encourage PO intake and supplements, if meal intake not adequate. Per SLP, patient also requiring set up and assistance to take in adequate amount. She continues on dysphagia 3 diet to aid in feeding herself.   Unsure of weight trend. Needs new weight. UBW endorsed as around 82kg. Will assess new weight, once collected. True body weight likely lower given documented amount of edema.   Admit Weight: 81.6kg  Continue to encourage calorie and protein intake as needs are elevated 2/2 cancer and diabetes diagnoses along with skin integrity  issues. Pt verbalizes understanding.     Intake/Output Summary (Last 24 hours) at 02/25/2023 2008 Last data filed  at 02/25/2023 0900 Gross per 24 hour  Intake 310 ml  Output 450 ml  Net -140 ml     Meds: fentanyl patch, SSI, levothyroxine, MVI, pantoprazole,   Labs: WBC 9.7>13.6 (H)  Diet Order:   Diet Order             DIET DYS 3 Room service appropriate? Yes with Assist; Fluid consistency: Thin  Diet effective now            EDUCATION NEEDS:  Education needs have been addressed  Skin:  Skin Assessment: Skin Integrity Issues: Skin Integrity Issues:: Stage II, DTI DTI: R buttock Stage II: sacrum  Last BM:  2/16 - type 6  Height:  Ht Readings from Last 1 Encounters:  02/16/23 5\' 9"  (1.753 m)   Weight:  Wt Readings from Last 1 Encounters:  02/16/23 81.6 kg    Ideal Body Weight:  65.9 kg  BMI:  Body mass index is 26.58 kg/m.  Estimated Nutritional Needs:   Kcal:  1700-1900 kcal  Protein:  80-100 gm  Fluid:  >1.7L/day  Myrtie Cruise MS, RD, LDN Registered Dietitian Clinical Nutrition RD Inpatient Contact Info in Amion

## 2023-02-25 NOTE — Assessment & Plan Note (Addendum)
 Stable on SSI, only required 2 units - CBG BID - SSI

## 2023-02-25 NOTE — TOC Progression Note (Addendum)
 Transition of Care [facility]) - Progression Note    Patient Details  Name: Erin Hoffman MRN: [id number] Date of Birth: 04-25-1945  [facility] CM/SW Contact  Delilah Shan, LCSWA Phone Number: 02/25/2023, 2:14 PM  Clinical Narrative:     Patients son Erin Hoffman informed CSW he has not heard anything back yet on SNF appeal. Erin Hoffman informed CSW he is going to give patients insurance a call to check on status of it. CSW will continue to follow.  CSW and patients son called [employer] to check on status of insurance appeal for SNF. CSW was transferred to [employer]. CSW LVM and awaiting call back.    Expected Discharge Plan: Skilled Nursing Facility Barriers to Discharge: Continued Medical Work up  Expected Discharge Plan and Services In-house Referral: Clinical Social Work     Living arrangements for the past 2 months:  (from [facility] short term)                                       Social Determinants of Health (SDOH) Interventions SDOH Screenings   Food Insecurity: No Food Insecurity (02/16/2023)  Housing: Unknown (02/16/2023)  Transportation Needs: No Transportation Needs (02/16/2023)  Utilities: Not At Risk (02/16/2023)  Depression (PHQ2-9): Low Risk  (02/15/2023)  Social Connections: Unknown (02/16/2023)  Tobacco Use: Medium Risk (02/18/2023)    Readmission Risk Interventions     No data to display

## 2023-02-25 NOTE — Progress Notes (Signed)
     Daily Progress Note Intern Pager: [phone]  Patient name: Erin Hoffman Medical record number: [id number] Date of birth: 20-Dec-1945 Age: 78 y.o. Gender: female  Primary Care Provider: [name], MD Consultants: RD Code Status: DNR/DNI  Pt Overview and Major Events to Date:  2/8: Admitted  Assessment and Plan:  This is a 78 year old female with a past history of metastatic cholangiocarcinoma, T2DM, HTN, hypothyroid and A-fib presenting with AMS and hypoglycemia from ALF likely secondary to decreased appetite in the setting of RSV and norovirus.  No longer hypoglycemic or altered, medically stable for discharge pending placement. Assessment & Plan Hypoglycemia Stable on SSI, only required 2 units - CBG BID - SSI Norovirus Stable. - Encourage PO intake as tolerable - Continue to monitor for symptoms Edema of right upper extremity Unchanged, suspect radiation therapy related - CTM Malnutrition of moderate degree RD consulted. - Glucerna Shake BID between meals - Assist with feeding patient  Chronic and Stable Problems:  Metastatic cholangiocarcinoma: Managed outpatient by Dr. Arlana Pouch, continuing home fentanyl patch.  Oxycodone 5 mg every 6 as needed T2DM: Sliding scale insulin, while admitted.  Will discuss home regimen at time of discharge. Hypertension: Holding home medications in setting of normotension Hypothyroid: Continue Synthroid 137 mcg daily A-fib: Not on Eliquis CT head negative for sign of stroke GERD: Home Protonix 40 mg HLD: Continue home Crestor 20 mg  FEN/GI: Regular PPx: Lovenox Dispo: Medically stable for discharge   pending placement at SNF .   Subjective:  Patient reports that she is feeling overall okay.  She is having some ongoing back pain but it is unchanged from her baseline.  Objective: Temp:  [98 F (36.7 C)-98.7 F (37.1 C)] 98.7 F (37.1 C) (02/17 0401) Pulse Rate:  [82-88] 88 (02/17 0401) Resp:  [15-18] 18 (02/17  0401) BP: (116-135)/(67-69) 135/69 (02/17 0401) SpO2:  [96 %-97 %] 96 % (02/16 2102) Physical Exam: General: Elderly appearing female, no distress Cardiovascular: RRR, no M/R/G Respiratory: CTAB, no increased work of breathing Abdomen: Flat, soft, nontender Extremities: 2+ swelling of the right upper extremity  Laboratory: Most recent CBC Lab Results  Component Value Date   WBC 13.6 (H) 02/25/2023   HGB 11.3 (L) 02/25/2023   HCT 34.1 (L) 02/25/2023   MCV 91.4 02/25/2023   PLT 347 02/25/2023   Most recent BMP    Latest Ref Rng & Units 02/19/2023    4:56 AM  BMP  Glucose 70 - 99 mg/dL 147   BUN 8 - 23 mg/dL 19   Creatinine 8.29 - 1.00 mg/dL 5.62   Sodium 130 - 865 mmol/L 135   Potassium 3.5 - 5.1 mmol/L 4.1   Chloride 98 - 111 mmol/L 100   CO2 22 - 32 mmol/L 22   Calcium 8.9 - 10.3 mg/dL 8.8    Gerrit Heck, DO 02/25/2023, 7:54 AM  PGY-1, [place] [facility] Medicine FPTS Intern pager: [phone], text pages welcome Secure chat group [facility] Teaching Service

## 2023-02-25 NOTE — Progress Notes (Addendum)
 Palliative Medicine Inpatient Follow Up Note HPI: Erin Hoffman is a 78 y.o. female with PMHx of metastatic cholangiocarcinoma, T2DM, HTN, hypothyroidism and A-fib presenting with AMS and hypoglycemia from ALF likely secondary to getting too much insulin in the setting of decreased appetite 2/2 RSV and Norovirus.     The PMT is familiar with Erin Hoffman from November and December when we were ask to support goals of care conversations in the setting of a a right thalamic hemorrhage. She was discharge from the inpatient setting to [facility] for ongoing therapy.    The PMT has been asked to get involved for additional GOC conversations during this hospitalization.  Today's Discussion 02/25/2023  *Please note that this is a verbal dictation therefore any spelling or grammatical errors are due to the "Dragon Medical One" system interpretation.  Chart reviewed inclusive of vital signs, progress notes, laboratory results, and diagnostic images. Pain at it's highest level appears to be a 6. She does improve when PRNs are provided. (+) 1 oxycodone in the last 24 hours.  I met with Erin Hoffman at bedside this morning. She is awake and oriented. She shares that she will need help with eating her breakfast. I shared that I would request someone support her with this.   From a symptom perspective, Erin Hoffman shares she has some lower back pain but it is fairly well controlled on her present regiment.   Patients RN, Samara Deist informed of patients needs. She shares she will assist in helping to set patient up.   TOC is working with patients family on SNF Appeal.   Questions and concerns addressed/Palliative Support Provided.   Objective Assessment: Vital Signs Vitals:   02/24/23 2102 02/25/23 0401  BP: 123/69 135/69  Pulse: 82 88  Resp: 16 18  Temp: 98 F (36.7 C) 98.7 F (37.1 C)  SpO2: 96%     Intake/Output Summary (Last 24 hours) at 02/25/2023 1048 Last data filed at 02/25/2023 0900 Gross per  24 hour  Intake 490 ml  Output 450 ml  Net 40 ml   Last Weight  Most recent update: 02/16/2023 12:20 PM    Weight  81.6 kg (180 lb)            Gen: Elderly Caucasian female in no acute distress HEENT: Dry mucous membranes CV: Regular rate and rhythm PULM: On room air breathing is even and nonlabored ABD: soft/nontender EXT: Left-sided hemiplegia Neuro: Alert and oriented x3  SUMMARY OF RECOMMENDATIONS   DNAR/DNI  Appreciate TOC helping with discharge planning --> if SNF appeal is denied family will pay out of pocket for [facility]   The Palliative care team will continue to offer support as needed   Code Status/Advance Care Planning: DNAR/DNI   Symptom Management:  Metastatic Cancer Pain: - Continue Fentanyl 77mcg/hr - Continue gabapentin 100 mg twice daily - Continue Oxycodone 5mg  PO Q6H PRN - Have requests OP follow up in the Palliative Oncology symptom management clinic with [facility]  Billing based on MDM: High controlled substance review and modification(s) as needed ______________________________________________________________________________________ Lamarr Lulas [place] Palliative Medicine Team Team Cell Phone: [phone] Please utilize secure chat with additional questions, if there is no response within 30 minutes please call the above phone number  Palliative Medicine Team providers are available by phone from 7am to 7pm daily and can be reached through the team cell phone.  Should this patient require assistance outside of these hours, please call the patient's attending physician.

## 2023-02-25 NOTE — Assessment & Plan Note (Addendum)
 Stable. - Encourage PO intake as tolerable - Continue to monitor for symptoms

## 2023-02-26 DIAGNOSIS — E162 Hypoglycemia, unspecified: Secondary | ICD-10-CM | POA: Diagnosis not present

## 2023-02-26 LAB — GLUCOSE, CAPILLARY
Glucose-Capillary: 187 mg/dL — ABNORMAL HIGH (ref 70–99)
Glucose-Capillary: 271 mg/dL — ABNORMAL HIGH (ref 70–99)
Glucose-Capillary: 242 mg/dL — ABNORMAL HIGH (ref 70–99)
Glucose-Capillary: 280 mg/dL — ABNORMAL HIGH (ref 70–99)

## 2023-02-26 MED ORDER — GERHARDT'S BUTT CREAM
TOPICAL_CREAM | Freq: Every day | CUTANEOUS | Status: DC
  Administered 2023-02-27: 1 via TOPICAL
  Filled 2023-02-26: qty 60

## 2023-02-26 MED ORDER — ENSURE ENLIVE PO LIQD
237.0000 mL | Freq: Two times a day (BID) | ORAL | Status: DC
  Administered 2023-02-26: 237 mL via ORAL

## 2023-02-26 MED ORDER — FENOFIBRATE 160 MG PO TABS
160.0000 mg | ORAL_TABLET | Freq: Every day | ORAL | Status: DC
  Administered 2023-02-26 – 2023-03-01 (×4): 160 mg via ORAL
  Filled 2023-02-26 (×4): qty 1

## 2023-02-26 NOTE — TOC Progression Note (Signed)
 Transition of Care [facility]) - Progression Note    Patient Details  Name: Erin Hoffman MRN: [id number] Date of Birth: 1945/05/19  [facility]) CM/SW Contact  Delilah Shan, LCSWA Phone Number: 02/26/2023, 5:05 PM  Clinical Narrative:     CSW spoke with Genevie Cheshire patients son who informed CSW that previous appeal he started was started for hospital stay and not for short term rehab. Genevie Cheshire started appeal yesterday for SNF. Billy informed CSW that patients insurance requested POA paperwork to be sent to them before they finish processing appeal. Genevie Cheshire informed CSW he plans on sending requested paperwork over today. CSW will continue to follow.  Expected Discharge Plan: Skilled Nursing Facility Barriers to Discharge: Continued Medical Work up  Expected Discharge Plan and Services In-house Referral: Clinical Social Work     Living arrangements for the past 2 months:  (from [facility] short term)                                       Social Determinants of Health (SDOH) Interventions SDOH Screenings   Food Insecurity: No Food Insecurity (02/16/2023)  Housing: Unknown (02/16/2023)  Transportation Needs: No Transportation Needs (02/16/2023)  Utilities: Not At Risk (02/16/2023)  Depression (PHQ2-9): Low Risk  (02/15/2023)  Social Connections: Unknown (02/16/2023)  Tobacco Use: Medium Risk (02/18/2023)    Readmission Risk Interventions     No data to display

## 2023-02-26 NOTE — Progress Notes (Signed)
 Physical Therapy Treatment Patient Details Name: Erin Hoffman MRN: [id number] DOB: 12/08/1945 Today's Date: 02/26/2023   History of Present Illness Erin Hoffman is a 78 y.o. female presenting 2/8 with AMS and hypoglycemia from SNF likely secondary to getting too much insulin in the setting of decreased appetite 2/2 RSV and Noroviruswith.  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 Pt requires max assist for bed mobility with PT providing cuing for compensatory technique but pt requiring extra time to follow commands, engage in task. Pt tolerated sitting EOB ~8 minutes but required mod<>min assist 2/2 impaired balance & core/trunk control. Attempted STS from elevated EOB but pt unable to clear buttocks even with max assist & PT blocking L knee. Pt fatigued at end of session. Left pt in bed in chair position for more upright tolerance.    If plan is discharge home, recommend the following: Two people to help with walking and/or transfers;Two people to help with bathing/dressing/bathroom;Assist for transportation;Assistance with feeding;Assistance with cooking/housework;Supervision due to cognitive status;Direct supervision/assist for financial management;Direct supervision/assist for medications management;Help with stairs or ramp for entrance   Can travel by private vehicle     No  Equipment Recommendations  None recommended by PT    Recommendations for Other Services       Precautions / Restrictions Precautions Precautions: Fall Restrictions Weight Bearing Restrictions Per Provider Order: No     Mobility  Bed Mobility Overal bed mobility: Needs Assistance Bed Mobility: Supine to Sit, Sit to Supine     Supine to sit: Max assist, HOB elevated, Used rails Sit to supine: Max assist, Used rails, HOB elevated   General  bed mobility comments: PT providing cuing for compensatory technique for supine>sit but pt with decreased initiation/command following. Pt requires max assist to move BLE off EOB & to upright trunk. Pt somewhat participates in scooting to [facility] with bed in trendelenburg position, cuing to locate & use bed rails with RUE.    Transfers Overall transfer level: Needs assistance   Transfers: Sit to/from Stand Sit to Stand: Max assist           General transfer comment: Attempted STS from EOB ~3 times with PT blocking L knee to prevent buckling. Pt with poor ability to clear buttocks from EOB despite elevated EOB & PT assistance.    Ambulation/Gait                   Stairs             Wheelchair Mobility     Tilt Bed    Modified Rankin (Stroke Patients Only)       Balance Overall balance assessment: Needs assistance Sitting-balance support: Feet supported, Single extremity supported, No upper extremity supported Sitting balance-Leahy Scale: Poor Sitting balance - Comments: L lateral lean, PT providing cuing to lean on R hand but pt begins to push L with RUE. Pt able to lean on R elbow with improvement/less pushing. Pt engaged in reaching outside of BOS with RUE with focus on core/trunk strengthening, correcting midline orientation. Pt requires mod assist fade to min assist for static sitting balance.                                    Communication  Cognition Arousal: Alert Behavior During Therapy: Flat affect   PT - Cognitive impairments: Memory, Initiation, Sequencing, Attention, Problem solving, Safety/Judgement, Awareness                         Following commands: Impaired Following commands impaired: Only follows one step commands consistently, Follows one step commands with increased time    Cueing Cueing Techniques: Gestural cues, Verbal cues, Tactile cues, Visual cues  Exercises Other Exercises Other Exercises: Pt performs  PROM to L elbow & shoulder, L hip/knee    General Comments        Pertinent Vitals/Pain Pain Assessment Pain Assessment: Faces Faces Pain Scale: Hurts even more Pain Location: generalized Pain Descriptors / Indicators: Discomfort Pain Intervention(s): Monitored during session, Limited activity within patient's tolerance (pt reports she's premedicated)    Home Living                          Prior Function            PT Goals (current goals can now be found in the care plan section) Acute Rehab PT Goals Patient Stated Goal: to get stronger PT Goal Formulation: With patient Time For Goal Achievement: 03/05/23 Potential to Achieve Goals: Fair Progress towards PT goals: Progressing toward goals    Frequency    Min 1X/week      PT Plan      Co-evaluation              AM-PAC PT "6 Clicks" Mobility   Outcome Measure  Help needed turning from your back to your side while in a flat bed without using bedrails?: Total Help needed moving from lying on your back to sitting on the side of a flat bed without using bedrails?: Total Help needed moving to and from a bed to a chair (including a wheelchair)?: Total Help needed standing up from a chair using your arms (e.g., wheelchair or bedside chair)?: Total Help needed to walk in hospital room?: Total Help needed climbing 3-5 steps with a railing? : Total 6 Click Score: 6    End of Session   Activity Tolerance: Patient limited by fatigue Patient left: in bed;with call bell/phone within reach;with bed alarm set (bed in chair position)   PT Visit Diagnosis: Muscle weakness (generalized) (M62.81);Hemiplegia and hemiparesis;Pain;Other abnormalities of gait and mobility (R26.89) Hemiplegia - Right/Left: Left Hemiplegia - dominant/non-dominant: Non-dominant Hemiplegia - caused by: Other Nontraumatic intracranial hemorrhage Pain - part of body:  (generalized)     Time: 0865-7846 PT Time Calculation (min)  (ACUTE ONLY): 20 min  Charges:    $Neuromuscular Re-education: 8-22 mins PT General Charges $$ ACUTE PT VISIT: 1 Visit                     Aleda Grana, PT, DPT 02/26/23, 11:11 AM   Sandi Mariscal 02/26/2023, 11:10 AM

## 2023-02-26 NOTE — Progress Notes (Signed)
     Daily Progress Note Intern Pager: [phone]  Patient name: Erin Hoffman Medical record number: [id number] Date of birth: Jul 19, 1945 Age: 78 y.o. Gender: female  Primary Care Provider: [name], MD Consultants: RD Code Status: DNR/DNI  Pt Overview and Major Events to Date:  2/8: Admitted  Assessment and Plan:  This is a 79 year old female with a past history of metastatic cholangiocarcinoma, T2DM, hypertension, hypothyroid and A-fib presenting with altered mental status and hypoglycemia from ALF likely secondary to decreased appetite in the setting of RSV and norovirus.  No longer hypoglycemic or altered, medically stable for discharge pending placement authorization. Assessment & Plan Hypoglycemia Stable on SSI - CBG BID - SSI Norovirus Stable. - Encourage PO intake as tolerable - Continue to monitor for symptoms Edema of right upper extremity Unchanged, suspect radiation therapy related - CTM Malnutrition of moderate degree RD consulted. - Glucerna Shake BID between meals - Assist with feeding patient  Chronic and Stable Problems:  Metastatic cholangiocarcinoma: Managed outpatient by Dr. Arlana Pouch, continuing home fentanyl patch.  Oxycodone 5 mg every 6 hours as needed T2DM: Sliding scale insulin while admitted.  Will discuss home regimen at time of discharge HTN: Holding home medications in setting of normotension Hypothyroid: Continue Synthroid 137 mcg daily A-fib: Not on Eliquis, CT head negative for signs of stroke GERD: Home Protonix 40 mg HLD: Continue home Crestor 20 mg  FEN/GI: Regular  PPx: Lovenox Dispo: Medically stable for discharge   pending authorization .   Subjective:  Patient feels well today, still having back pain but unchanged from previous.  Objective: Temp:  [97.7 F (36.5 C)-98.2 F (36.8 C)] 97.7 F (36.5 C) (02/18 0331) Pulse Rate:  [68-76] 76 (02/18 0331) Resp:  [16-18] 18 (02/18 0331) BP: (115-133)/(55-69) 115/67 (02/18  0331) Physical Exam: General: Elderly appearing woman, no distress Cardiovascular: RRR, no M/R/G Respiratory: CTAB, no increased work of breathing Abdomen: Flat, soft, nontender Extremities: Swelling in the upper extremity is reduced compared to previous exams, 2+ pulses bilaterally.  Laboratory: Most recent CBC Lab Results  Component Value Date   WBC 13.6 (H) 02/25/2023   HGB 11.3 (L) 02/25/2023   HCT 34.1 (L) 02/25/2023   MCV 91.4 02/25/2023   PLT 347 02/25/2023   Most recent BMP    Latest Ref Rng & Units 02/19/2023    4:56 AM  BMP  Glucose 70 - 99 mg/dL 644   BUN 8 - 23 mg/dL 19   Creatinine 0.34 - 1.00 mg/dL 7.42   Sodium 595 - 638 mmol/L 135   Potassium 3.5 - 5.1 mmol/L 4.1   Chloride 98 - 111 mmol/L 100   CO2 22 - 32 mmol/L 22   Calcium 8.9 - 10.3 mg/dL 8.8     Gerrit Heck, DO 02/26/2023, 7:45 AM  PGY-1, [facility] FPTS Intern pager: [phone], text pages welcome Secure chat group [facility] Teaching Service

## 2023-02-26 NOTE — Plan of Care (Signed)

## 2023-02-26 NOTE — Plan of Care (Signed)
  Problem: Clinical Measurements: Goal: Ability to maintain clinical measurements within normal limits will improve Outcome: Progressing Goal: Will remain free from infection Outcome: Progressing Goal: Diagnostic test results will improve Outcome: Progressing Goal: Respiratory complications will improve Outcome: Progressing Goal: Cardiovascular complication will be avoided Outcome: Progressing   Problem: Coping: Goal: Level of anxiety will decrease Outcome: Progressing   Problem: Elimination: Goal: Will not experience complications related to bowel motility Outcome: Progressing Goal: Will not experience complications related to urinary retention Outcome: Progressing   Problem: Pain Managment: Goal: General experience of comfort will improve and/or be controlled Outcome: Progressing   Problem: Safety: Goal: Ability to remain free from injury will improve Outcome: Progressing   Problem: Skin Integrity: Goal: Risk for impaired skin integrity will decrease Outcome: Progressing

## 2023-02-26 NOTE — Assessment & Plan Note (Signed)
 RD consulted. - Glucerna Shake BID between meals - Assist with feeding patient

## 2023-02-26 NOTE — Consult Note (Signed)
 [facility] Liaison Consult Note   02/26/2023  Erin Hoffman 09-30-1945 [id number]  Insurance: [employer]   Primary Care Provider: [name], MD with [facility], reviewed  this provider is listed does not participate in VBCI at this time.   RN Hospital Liaison unit rounds reviewed for  The patient was screened for LLOS 10 days on the high risk BI list. day readmission hospitalization with noted   Provider not participating in [place], will sign off.     For questions contact:   Charlesetta Shanks, RN, BSN, CCM [place]  [facility] Liaison Direct Dial: [phone] or secure chat Email: [email or web address]

## 2023-02-26 NOTE — Care Management Important Message (Signed)
 Important Message  Patient Details  Name: Erin Hoffman MRN: [id number] Date of Birth: 18-Feb-1945   Important Message Given:  Yes - Medicare IM     Renie Ora 02/26/2023, 1:48 PM

## 2023-02-26 NOTE — Assessment & Plan Note (Signed)
 Unchanged, suspect radiation therapy related - CTM

## 2023-02-26 NOTE — Assessment & Plan Note (Signed)
 Stable on SSI - CBG BID - SSI

## 2023-02-26 NOTE — Assessment & Plan Note (Signed)
 Stable. - Encourage PO intake as tolerable - Continue to monitor for symptoms

## 2023-02-27 ENCOUNTER — Inpatient Hospital Stay: Payer: HMO

## 2023-02-27 ENCOUNTER — Encounter (HOSPITAL_COMMUNITY): Payer: Self-pay | Admitting: Family Medicine

## 2023-02-27 DIAGNOSIS — E162 Hypoglycemia, unspecified: Secondary | ICD-10-CM | POA: Diagnosis not present

## 2023-02-27 LAB — BASIC METABOLIC PANEL
Anion gap: 7 (ref 5–15)
BUN: 22 mg/dL (ref 8–23)
CO2: 21 mmol/L — ABNORMAL LOW (ref 22–32)
Calcium: 8.7 mg/dL — ABNORMAL LOW (ref 8.9–10.3)
Chloride: 100 mmol/L (ref 98–111)
Creatinine, Ser: 1.02 mg/dL — ABNORMAL HIGH (ref 0.44–1.00)
GFR, Estimated: 56 mL/min — ABNORMAL LOW (ref >60)
Glucose, Bld: 192 mg/dL — ABNORMAL HIGH (ref 70–99)
Potassium: 4.2 mmol/L (ref 3.5–5.1)
Sodium: 128 mmol/L — ABNORMAL LOW (ref 135–145)

## 2023-02-27 LAB — GLUCOSE, CAPILLARY
Glucose-Capillary: 153 mg/dL — ABNORMAL HIGH (ref 70–99)
Glucose-Capillary: 241 mg/dL — ABNORMAL HIGH (ref 70–99)
Glucose-Capillary: 191 mg/dL — ABNORMAL HIGH (ref 70–99)
Glucose-Capillary: 174 mg/dL — ABNORMAL HIGH (ref 70–99)

## 2023-02-27 LAB — OSMOLALITY: Osmolality: 284 mosm/kg (ref 275–295)

## 2023-02-27 MED ORDER — INSULIN GLARGINE-YFGN 100 UNIT/ML ~~LOC~~ SOLN
5.0000 [IU] | Freq: Every day | SUBCUTANEOUS | Status: DC
  Administered 2023-02-27 – 2023-03-01 (×3): 5 [IU] via SUBCUTANEOUS
  Filled 2023-02-27 (×3): qty 0.05

## 2023-02-27 NOTE — Assessment & Plan Note (Addendum)
 RD consulted. Mild hyponatremia on labs this morning - Glucerna Shake BID between meals - Assist with feeding patient - 1L NS at 158mL/h?

## 2023-02-27 NOTE — TOC Progression Note (Signed)
 Transition of Care [facility]) - Progression Note    Patient Details  Name: Erin Hoffman MRN: [id number] Date of Birth: [birth year]  Transition of Care [facility]) CM/SW Contact  Delilah Shan, LCSWA Phone Number: 02/27/2023, 1:21 PM  Clinical Narrative:     CSW spoke with Genevie Cheshire patients son who confirmed he sent over requested paper work to patients insurance. Insurance appeal for SNF currently pending. CSW will continue to follow.   Expected Discharge Plan: Skilled Nursing Facility Barriers to Discharge: Continued Medical Work up  Expected Discharge Plan and Services In-house Referral: Clinical Social Work     Living arrangements for the past 2 months:  (from [facility] short term)                                       Social Determinants of Health (SDOH) Interventions SDOH Screenings   Food Insecurity: No Food Insecurity (02/16/2023)  Housing: Unknown (02/16/2023)  Transportation Needs: No Transportation Needs (02/16/2023)  Utilities: Not At Risk (02/16/2023)  Depression (PHQ2-9): Low Risk  (02/15/2023)  Social Connections: Unknown (02/16/2023)  Tobacco Use: Medium Risk (02/18/2023)    Readmission Risk Interventions     No data to display

## 2023-02-27 NOTE — Assessment & Plan Note (Addendum)
 Stable on SSI, but sugar in the 200s with meals - add 5u LAI - CBG BID - SSI

## 2023-02-27 NOTE — Assessment & Plan Note (Signed)
 Improved, likely related to radiation treatment. - CTM

## 2023-02-27 NOTE — Assessment & Plan Note (Addendum)
 Patient is eating and drinking appropriately at this time.  No further reported diarrhea or vomiting. Discontinue contact precautions

## 2023-02-27 NOTE — Progress Notes (Signed)
     Daily Progress Note Intern Pager: [phone]  Patient name: Erin Hoffman Medical record number: [id number] Date of birth: 1945-04-10 Age: 78 y.o. Gender: female  Primary Care Provider: [name], MD Consultants: RD Code Status: DNR/DNI  Pt Overview and Major Events to Date:  2/8: Admitted  Assessment and Plan:  This is a 78 year old female patient with a past history of metastatic cholangiocarcinoma, T2DM, HTN, hypothyroid and A-fib presenting with altered mental status and hypoglycemia from ALF likely secondary to decreased appetite in the setting of RSV and norovirus.  No longer hypoglycemic or altered, medically stable for discharge and pending placement authorization. Assessment & Plan Hypoglycemia Stable on SSI, but sugar in the 200s with meals - add 5u LAI - CBG BID - SSI Norovirus (Resolved: 02/27/2023) Patient is eating and drinking appropriately at this time.  No further reported diarrhea or vomiting. Discontinue contact precautions Edema of right upper extremity Improved, likely related to radiation treatment. - CTM Malnutrition of moderate degree RD consulted. Mild hyponatremia on labs this morning - Glucerna Shake BID between meals - Assist with feeding patient - 1L NS at 165mL/h?  Chronic and Stable Problems:  Metastatic cholangiocarcinoma: Managed outpatient by Dr. Arlana Hoffman, continue home fentanyl patch.  Oxycodone 5 mg every 6 hours as needed T2DM: Sliding scale insulin HTN: Holding home medications in setting of normotension Hypothyroid: Continue Synthroid 137 mcg daily A-fib: Monitor heart rate, not on anticoagulation GERD: Home Protonix 40 mg daily HLD: Home Crestor 20 mg  FEN/GI: Regular PPx: Lovenox Dispo: Medically stable for discharge   pending authorization .   Subjective:  Erin Hoffman remains stable without complaint, other than her chronic pain  Objective: Temp:  [97.8 F (36.6 C)-98.1 F (36.7 C)] 97.8 F (36.6 C) (02/19  0543) Pulse Rate:  [73-78] 73 (02/19 0543) Resp:  [18] 18 (02/19 0543) BP: (122-140)/(59-75) 140/61 (02/19 0543) SpO2:  [95 %-97 %] 95 % (02/19 0543) Weight:  [85.4 kg] 85.4 kg (02/18 1428) Physical Exam: General: Elderly appearing woman, no distress Cardiovascular: RRR, no m/r/g Respiratory: CTAB, no increased WOB Abdomen: Flat, soft, nontender Extremities: BUE with swelling of the hands, no evidence of edema in the BLE  Laboratory: Most recent CBC Lab Results  Component Value Date   WBC 13.6 (H) 02/25/2023   HGB 11.3 (L) 02/25/2023   HCT 34.1 (L) 02/25/2023   MCV 91.4 02/25/2023   PLT 347 02/25/2023   Most recent BMP    Latest Ref Rng & Units 02/27/2023    6:49 AM  BMP  Glucose 70 - 99 mg/dL 147   BUN 8 - 23 mg/dL 22   Creatinine 8.29 - 1.00 mg/dL 5.62   Sodium 130 - 865 mmol/L 128   Potassium 3.5 - 5.1 mmol/L 4.2   Chloride 98 - 111 mmol/L 100   CO2 22 - 32 mmol/L 21   Calcium 8.9 - 10.3 mg/dL 8.7     Erin Heck, DO 02/27/2023, 7:33 AM  PGY-1, [place] [facility] FPTS Intern pager: [phone], text pages welcome Secure chat group [facility] Teaching Service

## 2023-02-27 NOTE — Plan of Care (Signed)
  Problem: Clinical Measurements: Goal: Ability to maintain clinical measurements within normal limits will improve Outcome: Progressing Goal: Will remain free from infection Outcome: Progressing Goal: Respiratory complications will improve Outcome: Not Applicable Goal: Cardiovascular complication will be avoided Outcome: Progressing   Problem: Nutrition: Goal: Adequate nutrition will be maintained Outcome: Not Progressing   Problem: Elimination: Goal: Will not experience complications related to urinary retention Outcome: Progressing   Problem: Safety: Goal: Ability to remain free from injury will improve Outcome: Progressing

## 2023-02-28 LAB — COMPREHENSIVE METABOLIC PANEL
ALT: 14 U/L (ref 0–44)
AST: 44 U/L — ABNORMAL HIGH (ref 15–41)
Albumin: 1.7 g/dL — ABNORMAL LOW (ref 3.5–5.0)
Alkaline Phosphatase: 143 U/L — ABNORMAL HIGH (ref 38–126)
Anion gap: 9 (ref 5–15)
BUN: 19 mg/dL (ref 8–23)
CO2: 22 mmol/L (ref 22–32)
Calcium: 8.8 mg/dL — ABNORMAL LOW (ref 8.9–10.3)
Chloride: 98 mmol/L (ref 98–111)
Creatinine, Ser: 0.59 mg/dL (ref 0.44–1.00)
GFR, Estimated: >60 mL/min (ref >60)
Glucose, Bld: 153 mg/dL — ABNORMAL HIGH (ref 70–99)
Potassium: 4.6 mmol/L (ref 3.5–5.1)
Sodium: 129 mmol/L — ABNORMAL LOW (ref 135–145)
Total Bilirubin: 0.6 mg/dL (ref 0.0–1.2)
Total Protein: 5.1 g/dL — ABNORMAL LOW (ref 6.5–8.1)

## 2023-02-28 LAB — GLUCOSE, CAPILLARY
Glucose-Capillary: 165 mg/dL — ABNORMAL HIGH (ref 70–99)
Glucose-Capillary: 160 mg/dL — ABNORMAL HIGH (ref 70–99)
Glucose-Capillary: 139 mg/dL — ABNORMAL HIGH (ref 70–99)
Glucose-Capillary: 152 mg/dL — ABNORMAL HIGH (ref 70–99)
Glucose-Capillary: 150 mg/dL — ABNORMAL HIGH (ref 70–99)

## 2023-02-28 LAB — BASIC METABOLIC PANEL
Anion gap: 9 (ref 5–15)
BUN: 19 mg/dL (ref 8–23)
CO2: 22 mmol/L (ref 22–32)
Calcium: 8.9 mg/dL (ref 8.9–10.3)
Chloride: 100 mmol/L (ref 98–111)
Creatinine, Ser: 0.56 mg/dL (ref 0.44–1.00)
GFR, Estimated: >60 mL/min (ref >60)
Glucose, Bld: 167 mg/dL — ABNORMAL HIGH (ref 70–99)
Potassium: 4.5 mmol/L (ref 3.5–5.1)
Sodium: 131 mmol/L — ABNORMAL LOW (ref 135–145)

## 2023-02-28 MED ORDER — ACETAMINOPHEN 325 MG PO TABS
650.0000 mg | ORAL_TABLET | Freq: Four times a day (QID) | ORAL | Status: DC
  Administered 2023-02-28 – 2023-03-01 (×4): 650 mg via ORAL
  Filled 2023-02-28 (×4): qty 2

## 2023-02-28 MED ORDER — FUROSEMIDE 40 MG PO TABS
40.0000 mg | ORAL_TABLET | Freq: Once | ORAL | Status: AC
  Administered 2023-02-28: 40 mg via ORAL
  Filled 2023-02-28: qty 1

## 2023-02-28 NOTE — Progress Notes (Addendum)
   Palliative Medicine Inpatient Follow Up Note   Per secure chat with patients primary team they are awaiting the medicare appeal decision.   There are no active - ongoing Palliative needs that require our involvement.  A TOC order has been placed for ongoing outpatient Palliative support when discharged.  We will sign off at this time though please do not hesitate to re-consult PMT if additional concerns arise.   No Charge ______________________________________________________________________________________ Lamarr Lulas [place] Palliative Medicine Team Team Cell Phone: [phone] Please utilize secure chat with additional questions, if there is no response within 30 minutes please call the above phone number  Palliative Medicine Team providers are available by phone from 7am to 7pm daily and can be reached through the team cell phone.  Should this patient require assistance outside of these hours, please call the patient's attending physician.

## 2023-02-28 NOTE — Progress Notes (Signed)
 Daily Progress Note Intern Pager: [phone]  Patient name: Erin Hoffman Medical record number: [id number] Date of birth: November 18, 1945 Age: 78 y.o. Gender: female  Primary Care Provider: [name], MD Consultants: RD Code Status: DNR/DNI   Pt Overview and Major Events to Date:  2/8: Admitted   Assessment and Plan:   This is a 78 year old female patient with a past history of metastatic cholangiocarcinoma, T2DM, HTN, hypothyroid and A-fib presenting with altered mental status and hypoglycemia from ALF likely secondary to decreased appetite in the setting of RSV and norovirus.  No longer hypoglycemic or significantly altered, medically stable for discharge and pending placement authorization. Assessment & Plan Hypoglycemia Stable on SSI, but sugar in the 200s initially with meals. After adding on 5u LAI, sugars in high 100s. - Continue 5u LAI - CBG BID - SSI Edema of right upper extremity Improved, likely related to radiation treatment. - CTM Malnutrition of moderate degree RD consulted. Mild hyponatremia on labs this morning - Glucerna Shake BID between meals - Assist with feeding patient - Consider fluids as needed AMS (altered mental status) (Resolved: 02/23/2023) Patient is only alert and oriented x 2 with me this morning.  She is following commands and otherwise having a good conversation with me.  Appears she was previously A&O x 3.  Consider some element of delirium (though I did see her pretty early this morning) that will improve with discharge given she is medically stable.  Chronic and Stable Problems:  Metastatic cholangiocarcinoma: Managed outpatient by Dr. Arlana Pouch, continue home fentanyl patch.  Oxycodone 5 mg every 6 hours as needed T2DM: Sliding scale insulin HTN: Holding home medications in setting of normotension Hypothyroid: Continue Synthroid 137 mcg daily A-fib: Monitor heart rate, not on anticoagulation GERD: Home Protonix 40 mg daily HLD: Home  Crestor 20 mg   FEN/GI: Regular PPx: Lovenox Dispo: Medically stable for discharge   pending authorization .   Subjective:  No current concerns.  Was able to sleep last night.  Objective: Temp:  [97.7 F (36.5 C)-98.3 F (36.8 C)] 98.3 F (36.8 C) (02/19 1934) Pulse Rate:  [71-79] 76 (02/19 1934) Resp:  [16-18] 16 (02/19 1934) BP: (120-140)/(61-79) 136/71 (02/19 1934) SpO2:  [94 %-95 %] 94 % (02/19 1142) Physical Exam: General: Sitting up in bed, no acute distress Cardiovascular: Regular rate and rhythm without murmurs rubs or gallops Respiratory: Clear to auscultation bilaterally anteriorly Abdomen: Soft, nondistended, nontender, normoactive bowel sounds Extremities: Gross deficit of left upper extremity movement consistent with prior stroke Neurological: Left-sided facial droop, patient oriented only to person and year  Laboratory: Most recent CBC Lab Results  Component Value Date   WBC 13.6 (H) 02/25/2023   HGB 11.3 (L) 02/25/2023   HCT 34.1 (L) 02/25/2023   MCV 91.4 02/25/2023   PLT 347 02/25/2023   Most recent BMP    Latest Ref Rng & Units 02/27/2023    6:49 AM  BMP  Glucose 70 - 99 mg/dL 147   BUN 8 - 23 mg/dL 22   Creatinine 8.29 - 1.00 mg/dL 5.62   Sodium 130 - 865 mmol/L 128   Potassium 3.5 - 5.1 mmol/L 4.2   Chloride 98 - 111 mmol/L 100   CO2 22 - 32 mmol/L 21   Calcium 8.9 - 10.3 mg/dL 8.7    Evette Georges, MD 02/28/2023, 3:04 AM  PGY-2, [place] [facility] FPTS Intern pager: [phone], text pages welcome Secure chat group [facility] Teaching Service

## 2023-02-28 NOTE — Assessment & Plan Note (Signed)
 Patient is only alert and oriented x 2 with me this morning.  She is following commands and otherwise having a good conversation with me.  Appears she was previously A&O x 3.  Consider some element of delirium (though I did see her pretty early this morning) that will improve with discharge given she is medically stable.

## 2023-02-28 NOTE — Assessment & Plan Note (Signed)
 RD consulted. Mild hyponatremia on labs this morning - Glucerna Shake BID between meals - Assist with feeding patient - Consider fluids as needed

## 2023-02-28 NOTE — Plan of Care (Signed)
  Problem: Clinical Measurements: Goal: Will remain free from infection Outcome: Progressing Goal: Cardiovascular complication will be avoided Outcome: Progressing   Problem: Activity: Goal: Risk for activity intolerance will decrease Outcome: Progressing   Problem: Nutrition: Goal: Adequate nutrition will be maintained Outcome: Progressing   Problem: Pain Managment: Goal: General experience of comfort will improve and/or be controlled Outcome: Progressing   Problem: Fluid Volume: Goal: Ability to maintain a balanced intake and output will improve Outcome: Progressing

## 2023-02-28 NOTE — Assessment & Plan Note (Signed)
 Improved, likely related to radiation treatment. - CTM

## 2023-02-28 NOTE — Progress Notes (Signed)
   [facility] Liaison Note:  Notified by [facility] manager of patient/family request for [facility] Palliative services at home after discharge.   Please call with any hospice or outpatient palliative care related questions.   Thank you for the opportunity to participate in this patient's care.   Glenna Fellows, BSN, RN, OCN [employer] [phone]

## 2023-02-28 NOTE — TOC Progression Note (Addendum)
 Transition of Care [facility]) - Progression Note    Patient Details  Name: Erin Hoffman MRN: [id number] Date of Birth: [birth year]  [facility]) CM/SW Contact  Delilah Shan, LCSWA Phone Number: 02/28/2023, 12:56 PM  Clinical Narrative:     CSW spoke with patients son Erin Hoffman to check on status of SNF appeal. Erin Hoffman informed CSW that he received call from patients insurance and was informed that patients SNF appeal was denied and that now it has went to a level 2 and the determination of level 2 could take up to 72 hours for determination. CSW will continue to follow and assist with patients dc planning needs.  Update- CSW received consult for pallative services to follow patient at SNF. CSW spoke with patients son Erin Hoffman who is agreeable for CSW to make referral to [facility] for palliative services to follow patient at SNF. CSW made referral to Erin Hoffman with [facility] for palliative services to follow patient at SNF.  Expected Discharge Plan: Skilled Nursing Facility Barriers to Discharge: Continued Medical Work up  Expected Discharge Plan and Services In-house Referral: Clinical Social Work     Living arrangements for the past 2 months:  (from [facility] short term)                                       Social Determinants of Health (SDOH) Interventions SDOH Screenings   Food Insecurity: No Food Insecurity (02/16/2023)  Housing: Unknown (02/16/2023)  Transportation Needs: No Transportation Needs (02/16/2023)  Utilities: Not At Risk (02/16/2023)  Depression (PHQ2-9): Low Risk  (02/15/2023)  Social Connections: Unknown (02/16/2023)  Tobacco Use: Medium Risk (02/18/2023)    Readmission Risk Interventions     No data to display

## 2023-02-28 NOTE — Assessment & Plan Note (Signed)
 Stable on SSI, but sugar in the 200s initially with meals. After adding on 5u LAI, sugars in high 100s. - Continue 5u LAI - CBG BID - SSI

## 2023-02-28 NOTE — Plan of Care (Signed)
 Called to bedside by nursing to update patient and family regarding R arm and leg pain. Patient is having arm discomfort, particularly in the upper arm on the right, under where the BP cuff sits. Will relay to nursing to please remove cuff when not in use for patient comfort.   Also assessed patient's leg, which she and her son report have decreased strength. On exam, she has 2+ pitting edema in the bilateral lower extremities to the mid shin that had not been noted previously. She is able to move the R leg, but is limited by pain in her back. Interventions as below: - obtain CMP to assess protein levels given malnutrition - apply ted hose - change tylenol to scheduled - consider dose of lasix for comfort  Gerrit Heck, DO PGY-1, Family Medicine

## 2023-03-01 ENCOUNTER — Inpatient Hospital Stay: Payer: HMO

## 2023-03-01 DIAGNOSIS — C221 Intrahepatic bile duct carcinoma: Secondary | ICD-10-CM | POA: Diagnosis not present

## 2023-03-01 DIAGNOSIS — T383X5A Adverse effect of insulin and oral hypoglycemic [antidiabetic] drugs, initial encounter: Secondary | ICD-10-CM

## 2023-03-01 DIAGNOSIS — E162 Hypoglycemia, unspecified: Secondary | ICD-10-CM | POA: Diagnosis not present

## 2023-03-01 DIAGNOSIS — R609 Edema, unspecified: Secondary | ICD-10-CM | POA: Insufficient documentation

## 2023-03-01 DIAGNOSIS — L8990 Pressure ulcer of unspecified site, unspecified stage: Secondary | ICD-10-CM | POA: Insufficient documentation

## 2023-03-01 DIAGNOSIS — C7951 Secondary malignant neoplasm of bone: Secondary | ICD-10-CM | POA: Diagnosis not present

## 2023-03-01 LAB — BASIC METABOLIC PANEL
Anion gap: 8 (ref 5–15)
BUN: 20 mg/dL (ref 8–23)
CO2: 24 mmol/L (ref 22–32)
Calcium: 8.7 mg/dL — ABNORMAL LOW (ref 8.9–10.3)
Chloride: 98 mmol/L (ref 98–111)
Creatinine, Ser: 0.63 mg/dL (ref 0.44–1.00)
GFR, Estimated: >60 mL/min (ref >60)
Glucose, Bld: 134 mg/dL — ABNORMAL HIGH (ref 70–99)
Potassium: 4.1 mmol/L (ref 3.5–5.1)
Sodium: 130 mmol/L — ABNORMAL LOW (ref 135–145)

## 2023-03-01 LAB — GLUCOSE, CAPILLARY
Glucose-Capillary: 123 mg/dL — ABNORMAL HIGH (ref 70–99)
Glucose-Capillary: 179 mg/dL — ABNORMAL HIGH (ref 70–99)
Glucose-Capillary: 187 mg/dL — ABNORMAL HIGH (ref 70–99)

## 2023-03-01 MED ORDER — PROSOURCE PLUS PO LIQD: 30.0000 mL | Freq: Every day | ORAL | Status: AC

## 2023-03-01 MED ORDER — FENTANYL 12 MCG/HR TD PT72: 1.0000 | MEDICATED_PATCH | TRANSDERMAL | 0 refills | Status: AC

## 2023-03-01 MED ORDER — OXYCODONE HCL 5 MG PO CAPS: 5.0000 mg | ORAL_CAPSULE | ORAL | 0 refills | Status: AC | PRN

## 2023-03-01 MED ORDER — INSULIN ASPART 100 UNIT/ML IJ SOLN: 0.0000 [IU] | Freq: Three times a day (TID) | INTRAMUSCULAR | Status: AC

## 2023-03-01 MED ORDER — FUROSEMIDE 40 MG PO TABS
40.0000 mg | ORAL_TABLET | Freq: Once | ORAL | Status: AC
  Administered 2023-03-01: 40 mg via ORAL
  Filled 2023-03-01: qty 1

## 2023-03-01 MED ORDER — GLUCERNA SHAKE PO LIQD: 237.0000 mL | Freq: Two times a day (BID) | ORAL | Status: AC

## 2023-03-01 MED ORDER — GABAPENTIN 100 MG PO CAPS: 100.0000 mg | ORAL_CAPSULE | Freq: Two times a day (BID) | ORAL | Status: AC

## 2023-03-01 MED ORDER — INSULIN GLARGINE-YFGN 100 UNIT/ML ~~LOC~~ SOLN: 5.0000 [IU] | Freq: Every day | SUBCUTANEOUS | Status: AC

## 2023-03-01 NOTE — Progress Notes (Signed)
     Daily Progress Note Intern Pager: [phone]  Patient name: Erin Hoffman Medical record number: [id number] Date of birth: 1945/05/28 Age: 78 y.o. Gender: female  Primary Care Provider: [name], MD Consultants: RD Code Status: DNR-limited  Pt Overview and Major Events to Date:  2/8: Admitted  Assessment and Plan:  Erin Hoffman is a 78 yo patient with a PMH of metastatic cholangiocarcinoma, T2DM, HTN, hypothroid and A-fib presenting with AMS in the the setting of hypoglycemia due to RSV and norovirus. No longer hypoglycemic or significantly altered. Medically stable for discharge, pending placement authorization. Assessment & Plan Hypoglycemia Stable on current regimen - Continue 5u LAI - CBG BID - SSI Edema of right upper extremity Improved, likely related to radiation treatment. - CTM Malnutrition of moderate degree RD consulted. Mild hyponatremia on labs this morning, improved from yesterday afternoon.  - Glucerna Shake BID between meals - Assist with feeding patient - Consider fluids as needed Edema Noted on exam yesterday in the bilateral lower extremities. Gave a dose of lasix 40 mg PO with minimal improvement, will give a repeat dose today. CTM  Chronic and Stable Problems:  Metastatic cholangiocarcinoma: Managed outpatient by Dr. Constance Haw, continue home fentanyl patch.  Oxycodone 5 mg every 6 hours as needed T2DM: Sliding scale insulin, 5 units long-acting insulin, CBG checks Pretension: Holding home medication in setting of normotension Hypothyroid: Continue Synthroid 137 mcg daily A-fib: Heart rate, not on anticoagulation GERD Home Protonix 40 mg HLD: Home Crestor 20 mg  FEN/GI: Regular PPx: Lovenox Dispo: Medically stable for discharge   pending authorization .   Subjective:  Patient states her arm is feeling better this morning, and her pain is better controlled after discussion with family yesterday.  Objective: Temp:  [97.6 F (36.4  C)-97.9 F (36.6 C)] 97.9 F (36.6 C) (02/21 0445) Pulse Rate:  [67-76] 76 (02/21 0445) Resp:  [18] 18 (02/21 0445) BP: (113-120)/(51-76) 113/51 (02/21 0445) SpO2:  [94 %-97 %] 97 % (02/21 0445) Physical Exam: General: Elderly female, no distress Cardiovascular: RRR, no m/r/g Respiratory: CTAB, no increased WOB Abdomen: Flat, soft, non-tender Extremities: 2+ pitting edema in all four extremities  Laboratory: Most recent CBC Lab Results  Component Value Date   WBC 13.6 (H) 02/25/2023   HGB 11.3 (L) 02/25/2023   HCT 34.1 (L) 02/25/2023   MCV 91.4 02/25/2023   PLT 347 02/25/2023   Most recent BMP    Latest Ref Rng & Units 03/01/2023    3:30 AM  BMP  Glucose 70 - 99 mg/dL 147   BUN 8 - 23 mg/dL 20   Creatinine 8.29 - 1.00 mg/dL 5.62   Sodium 130 - 865 mmol/L 130   Potassium 3.5 - 5.1 mmol/L 4.1   Chloride 98 - 111 mmol/L 98   CO2 22 - 32 mmol/L 24   Calcium 8.9 - 10.3 mg/dL 8.7     Gerrit Heck, DO 03/01/2023, 7:54 AM  PGY-1, [place] FPTS Intern pager: [phone], text pages welcome Secure chat group [facility] Teaching Service

## 2023-03-01 NOTE — TOC Progression Note (Addendum)
 Transition of Care [facility]) - Progression Note    Patient Details  Name: Erin Hoffman MRN: [id number] Date of Birth: [birth year]  Transition of Care [facility]) CM/SW Contact  Delilah Shan, LCSWA Phone Number: 03/01/2023, 12:25 PM  Clinical Narrative:     CSW received call from patients son Genevie Cheshire who informed CSW that patients appeal that went to level 2 was determined unfavorable. Billys plan is for patient to return to [facility] in LTC bed. CSW informed Star with [facility]. Star provided Kristi with [employer] office number for Genevie Cheshire to call to complete everything needed for patient to dc over today if medically ready. Genevie Cheshire confirmed he will call Kristi shortly. CSW will continue to follow.  Update- [name] confirmed [facility] can accept patient today. CSW informed MD.   Expected Discharge Plan: Skilled Nursing Facility Barriers to Discharge: Continued Medical Work up  Expected Discharge Plan and Services In-house Referral: Clinical Social Work     Living arrangements for the past 2 months:  (from [facility] short term)                                       Social Determinants of Health (SDOH) Interventions SDOH Screenings   Food Insecurity: No Food Insecurity (02/16/2023)  Housing: Unknown (02/16/2023)  Transportation Needs: No Transportation Needs (02/16/2023)  Utilities: Not At Risk (02/16/2023)  Depression (PHQ2-9): Low Risk  (02/15/2023)  Social Connections: Unknown (02/16/2023)  Tobacco Use: Medium Risk (02/18/2023)    Readmission Risk Interventions     No data to display

## 2023-03-01 NOTE — Progress Notes (Signed)
 Report called to [facility] receiving Nurse, Joellen Jersey, LPN. Questions and concerns addressed. No family at the bedside at the time of discharge. Awaiting PTAR to transfer patient out.

## 2023-03-01 NOTE — TOC Transition Note (Addendum)
 Transition of Care [facility]) - Discharge Note   Patient Details  Name: Erin Hoffman MRN: [id number] Date of Birth: [birth year]  Transition of Care [facility]) CM/SW Contact:  Delilah Shan, LCSWA Phone Number: 03/01/2023, 3:05 PM   Clinical Narrative:     Patient will DC to: [facility]   Anticipated DC date: 03/01/2023  Family notified: Genevie Cheshire   Transport by: Sharin Mons  ?  Per MD patient ready for DC to [facility] SNF with palliative services to follow. RN, patient, patient's family, Shawn with [facility],and facility notified of DC. Discharge Summary sent to facility. RN given number for report [phone] [id number]. DC packet on chart. DNR signed by MD attached to patients DC packet.Ambulance transport requested for patient.  CSW signing off.   Final next level of care: Skilled Nursing Facility Barriers to Discharge: No Barriers Identified   Patient Goals and CMS Choice Patient states their goals for this hospitalization and ongoing recovery are:: SNF CMS Medicare.gov Compare Post Acute Care list provided to:: Patient Choice offered to / list presented to : Patient, Adult Children      Discharge Placement              Patient chooses bed at: [facility] Patient to be transferred to facility by: PTAR Name of family member notified: Billy Patient and family notified of of transfer: 03/01/23  Discharge Plan and Services Additional resources added to the After Visit Summary for   In-house Referral: Clinical Social Work                                   Social Drivers of Health (SDOH) Interventions SDOH Screenings   Food Insecurity: No Food Insecurity (02/16/2023)  Housing: Unknown (02/16/2023)  Transportation Needs: No Transportation Needs (02/16/2023)  Utilities: Not At Risk (02/16/2023)  Depression (PHQ2-9): Low Risk  (02/15/2023)  Social Connections: Unknown (02/16/2023)  Tobacco Use: Medium Risk (02/18/2023)     Readmission Risk Interventions     No  data to display

## 2023-03-01 NOTE — Discharge Summary (Signed)
 Family Medicine Teaching [facility] Discharge Summary  Patient name: Erin Hoffman Medical record number: [id number] Date of birth: [birth year] Age: 78 y.o. Gender: female Date of Admission: 02/16/2023  Date of Discharge: 03/01/2023 Admitting Physician: Fortunato Curling, DO  Primary Care Provider: [name], MD Consultants: Palliative care, RD  Indication for Hospitalization: Hypoglycemia  Discharge Diagnoses/Problem List:  Principal Problem for Admission: Hypoglycemia Other Problems addressed during stay:  Principal Problem:   Hypoglycemia Active Problems:   Edema of right upper extremity   Malnutrition of moderate degree   Edema   Pressure injury of skin  Brief Hospital Course:  [name] is a 78 y.o.female with a history of metastatic cholangiocarcinoma, ICH, T2DM, HTN, hypothyroidism, and A-fib not on Eliquis, who was admitted to the [facility] Medicine Teaching Service at [facility] for hypoglycemia and AMS. Her hospital course is detailed below:  Hypoglycemia Presented with decreased oral intake likely in the setting of RSV and norovirus, however her CBGs were monitored at her ALF and 36 units of insulin prn was given based on CBG >140 at her ALF.  Lowest CBG 42.  Patient was treated with D50, D5 LR, and glucagon.  CBGs improved during hospitalization.  Insulin regimen was titrated to LAI 5 units plus SSI at discharge.  AMS Likely in the setting of hypoglycemia.  CT head negative and no new focal neurological deficits other than her prior deficits from previous ICH (inability to move left arm and left leg).  Back to her baseline confirmed by family at the time of discharge.  RSV  Norovirus Likely exposed at her ALF.  Supportive care provided and Tylenol as needed.  Oral intake was encouraged as tolerated.  Metastatic Cholangiocarcinoma Follows with [facility] health hematology/oncology Dr. Arlana Pouch.  Last seen 02/15/23.  Previously treated with cisplatin, gemcitabine, and  durvalumab in 11/2022.  Uses oxycodone and fentanyl patch for pain, which were continued during hospitalization.  Discussed engaging in palliative services with family, who were agreeable.  Palliative consult placed prior to discharge, and they were able to assist with pain control and feeding assistance recommendations.   Other chronic conditions were medically managed with home medications and formulary alternatives as necessary (HTN, hypothyroidism, A-fib, GERD, HLD)  PCP Follow-up Recommendations: Monitor CBGs closely Discontinued BP medications amlodipine and Enalapril due to normotension Started Gabapentin inpatient and discontinued Robaxin.  Follow-up on palliative care referral  Disposition: LTAC  Discharge Condition: Stable  Discharge Exam:  Vitals:   02/28/23 2009 03/01/23 0445  BP: 117/76 (!) 113/51  Pulse: 67 76  Resp: 18 18  Temp: 97.6 F (36.4 C) 97.9 F (36.6 C)  SpO2: 94% 97%   Physical Exam performed by Dr. Hyacinth Meeker: General: Elderly female, no distress Cardiovascular: RRR, no m/r/g Respiratory: CTAB, no increased WOB Abdomen: Flat, soft, non-tender Extremities: 2+ pitting edema in all four extremities  Significant Procedures: None  Significant Labs and Imaging:  No results for input(s): "WBC", "HGB", "HCT", "PLT" in the last 48 hours. Recent Labs  Lab 02/28/23 0348 02/28/23 1734 03/01/23 0330  NA 131* 129* 130*  K 4.5 4.6 4.1  CL 100 98 98  CO2 22 22 24   GLUCOSE 167* 153* 134*  BUN 19 19 20   CREATININE 0.56 0.59 0.63  CALCIUM 8.9 8.8* 8.7*  ALKPHOS  --  143*  --   AST  --  44*  --   ALT  --  14  --   ALBUMIN  --  1.7*  --  Pertinent Imaging: VAS [place] UPPER EXTREMITY VENOUS DUPLEX Result Date: 02/19/2023 Summary:  Right: No evidence of deep vein thrombosis in the upper extremity. No evidence of superficial vein thrombosis in the upper extremity.  Left: No evidence of thrombosis in the subclavian.  *See table(s) above for measurements and  observations.  D  CT Head Wo Contrast Result Date: 02/16/2023 IMPRESSION: 1. No acute intracranial abnormality. 2. 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  DG Chest Port 1 View Result Date: 02/16/2023 IMPRESSION: Trace right pleural effusion. No active pulmonary disease.   Results/Tests Pending at Time of Discharge: None  Discharge Medications:  Allergies as of 03/01/2023       Reactions   Tape Itching, Rash        Medication List     STOP taking these medications    amLODipine 10 MG tablet Commonly known as: NORVASC   enalapril 10 MG tablet Commonly known as: VASOTEC   glimepiride 4 MG tablet Commonly known as: AMARYL   HumaLOG 100 UNIT/ML injection Generic drug: insulin lispro   insulin glargine 100 UNIT/ML injection Commonly known as: LANTUS   methocarbamol 500 MG tablet Commonly known as: ROBAXIN       TAKE these medications    acetaminophen 325 MG tablet Commonly known as: TYLENOL Take 650 mg by mouth in the morning and at bedtime.   famotidine 20 MG tablet Commonly known as: PEPCID Take 20 mg by mouth 2 (two) times daily as needed (GERD).   feeding supplement (GLUCERNA SHAKE) Liqd Take 237 mLs by mouth 2 (two) times daily between meals.   (feeding supplement) PROSource Plus liquid Take 30 mLs by mouth at bedtime.   fenofibrate micronized 134 MG capsule Commonly known as: LOFIBRA Take 134 mg by mouth daily before breakfast.   fentaNYL 12 MCG/HR Commonly known as: DURAGESIC Place 1 patch onto the skin every 3 (three) days.   fexofenadine 180 MG tablet Commonly known as: ALLEGRA Take 180 mg by mouth daily as needed for allergies or rhinitis.   gabapentin 100 MG capsule Commonly known as: NEURONTIN Take 1 capsule (100 mg total) by mouth 2 (two) times daily.   insulin aspart 100 UNIT/ML injection Commonly known as: novoLOG Inject 0-9 Units into the skin 3 (three) times daily with meals.   insulin  glargine-yfgn 100 UNIT/ML injection Commonly known as: SEMGLEE Inject 0.05 mLs (5 Units total) into the skin daily. Start taking on: March 02, 2023   levothyroxine 137 MCG tablet Commonly known as: SYNTHROID Take 137 mcg by mouth daily before breakfast.   magnesium hydroxide 400 MG/5ML suspension Commonly known as: MILK OF MAGNESIA Take 30 mLs by mouth daily as needed for mild constipation.   magnesium oxide 400 (240 Mg) MG tablet Commonly known as: MAG-OX Take 1 tablet (400 mg total) by mouth 2 (two) times daily.   oxycodone 5 MG capsule Commonly known as: OXY-IR Take 1 capsule (5 mg total) by mouth every 4 (four) hours as needed for pain.   pantoprazole 40 MG tablet Commonly known as: PROTONIX Take 1 tablet (40 mg total) by mouth daily.   polyethylene glycol 17 g packet Commonly known as: MIRALAX / GLYCOLAX Take 17 g by mouth daily.   PRO-STAT PO Take 30 mLs by mouth daily.   promethazine 25 MG tablet Commonly known as: PHENERGAN Take 25 mg by mouth every 6 (six) hours as needed for nausea or vomiting.   rosuvastatin 20 MG tablet Commonly known as:  CRESTOR Take 1 tablet (20 mg total) by mouth at bedtime.        Discharge Instructions: Please refer to Patient Instructions section of EMR for full details.  Patient was counseled important signs and symptoms that should prompt return to medical care, changes in medications, dietary instructions, activity restrictions, and follow up appointments.    Elberta Fortis, MD 03/01/2023, 3:03 PM PGY-2, [facility] Family Medicine

## 2023-03-01 NOTE — Plan of Care (Signed)
  Problem: Education: Goal: Knowledge of General Education information will improve Description: Including pain rating scale, medication(s)/side effects and non-pharmacologic comfort measures Outcome: Adequate for Discharge   Problem: Health Behavior/Discharge Planning: Goal: Ability to manage health-related needs will improve Outcome: Adequate for Discharge   Problem: Clinical Measurements: Goal: Ability to maintain clinical measurements within normal limits will improve Outcome: Adequate for Discharge Goal: Will remain free from infection Outcome: Adequate for Discharge Goal: Diagnostic test results will improve Outcome: Adequate for Discharge Goal: Cardiovascular complication will be avoided Outcome: Adequate for Discharge   Problem: Activity: Goal: Risk for activity intolerance will decrease Outcome: Adequate for Discharge   Problem: Nutrition: Goal: Adequate nutrition will be maintained Outcome: Adequate for Discharge   Problem: Coping: Goal: Level of anxiety will decrease Outcome: Adequate for Discharge   Problem: Elimination: Goal: Will not experience complications related to bowel motility Outcome: Adequate for Discharge Goal: Will not experience complications related to urinary retention Outcome: Adequate for Discharge   Problem: Pain Managment: Goal: General experience of comfort will improve and/or be controlled Outcome: Adequate for Discharge   Problem: Safety: Goal: Ability to remain free from injury will improve Outcome: Adequate for Discharge   Problem: Skin Integrity: Goal: Risk for impaired skin integrity will decrease Outcome: Adequate for Discharge   Problem: Education: Goal: Ability to describe self-care measures that may prevent or decrease complications (Diabetes Survival Skills Education) will improve Outcome: Adequate for Discharge Goal: Individualized Educational Video(s) Outcome: Adequate for Discharge   Problem: Coping: Goal: Ability  to adjust to condition or change in health will improve Outcome: Adequate for Discharge   Problem: Fluid Volume: Goal: Ability to maintain a balanced intake and output will improve Outcome: Adequate for Discharge   Problem: Health Behavior/Discharge Planning: Goal: Ability to identify and utilize available resources and services will improve Outcome: Adequate for Discharge Goal: Ability to manage health-related needs will improve Outcome: Adequate for Discharge   Problem: Metabolic: Goal: Ability to maintain appropriate glucose levels will improve Outcome: Adequate for Discharge   Problem: Nutritional: Goal: Maintenance of adequate nutrition will improve Outcome: Adequate for Discharge Goal: Progress toward achieving an optimal weight will improve Outcome: Adequate for Discharge   Problem: Skin Integrity: Goal: Risk for impaired skin integrity will decrease Outcome: Adequate for Discharge   Problem: Tissue Perfusion: Goal: Adequacy of tissue perfusion will improve Outcome: Adequate for Discharge

## 2023-03-01 NOTE — Assessment & Plan Note (Signed)
 Stable on current regimen - Continue 5u LAI - CBG BID - SSI

## 2023-03-01 NOTE — Assessment & Plan Note (Signed)
 Improved, likely related to radiation treatment. - CTM

## 2023-03-01 NOTE — Assessment & Plan Note (Signed)
 RD consulted. Mild hyponatremia on labs this morning, improved from yesterday afternoon.  - Glucerna Shake BID between meals - Assist with feeding patient - Consider fluids as needed

## 2023-03-01 NOTE — Progress Notes (Signed)
 Physical Therapy Treatment Patient Details Name: Erin Hoffman MRN: [id number] DOB: 1945/10/02 Today's Date: 03/01/2023   History of Present Illness Erin Hoffman is a 78 y.o. female presenting 2/8 with AMS and hypoglycemia from SNF likely secondary to getting too much insulin in the setting of decreased appetite 2/2 RSV and Noroviruswith.  PMHx: metastatic cholangiocarcinoma, T2DM, HTN, hypothyroidism, Right ICH 11/24 and A-fib    PT Comments  Pt pleasant and agreeable to PT session. Pt demonstrated improved seated balance able to reduce to no UE support and maintain upright posture for a short period of time with supervision-CGA. As she fatigued, developed a posterior lean, utilized RUE support on handrail, and required modA. Introduced stedy to assist pt with STS. She was able to clear her buttocks off a slightly raised bed with modA x2. Unable to completely achieve standing, d/t BM and need to return to supine in order for hygiene to be addressed. Patient will benefit from continued inpatient follow up therapy, <3 hours/day. Will continue to follow acutely and advance appropriately.     If plan is discharge home, recommend the following: Two people to help with walking and/or transfers;Two people to help with bathing/dressing/bathroom;Assist for transportation;Assistance with feeding;Assistance with cooking/housework;Supervision due to cognitive status;Direct supervision/assist for financial management;Direct supervision/assist for medications management;Help with stairs or ramp for entrance   Can travel by private vehicle     No  Equipment Recommendations  None recommended by PT    Recommendations for Other Services       Precautions / Restrictions Precautions Precautions: Fall Recall of Precautions/Restrictions: Impaired Restrictions Weight Bearing Restrictions Per Provider Order: No     Mobility  Bed Mobility Overal bed mobility: Needs Assistance Bed Mobility: Supine  to Sit, Sit to Supine, Rolling Rolling: Used rails, Mod assist, Max assist   Supine to sit: HOB elevated, Used rails, Mod assist, +2 for safety/equipment, Max assist Sit to supine: HOB elevated, Mod assist, +2 for safety/equipment, Max assist   General bed mobility comments: Pt brought RLE off EOB, required maxA to bring LLE off EOB. Pt pulled with RUE on handrail and required modA at trunk to obtain upright and modA via bedpad to scoot to EOB. Returning to supine pt went down on her R shoulder and PT brought BLE into bed with maxA. Pt rolled L by pulling with RUE on handrail and pushign with RLE in flex, required modA to reach and maintain sidelying for hygiene to be addressed. Pt rolled R with maxA x2 as LHB was unable to participate. Repositioned using bed features.    Transfers Overall transfer level: Needs assistance Equipment used: None Transfers: Sit to/from Stand Sit to Stand: Mod assist, +2 safety/equipment, Via lift equipment, From elevated surface           General transfer comment: STS from slightly raised surface to fit the stedy underneath bed. Pt required maxA to position LLE appropraitely on device. Cued pt on sequencing. She was unable to engaged her LUE and maintained it closer to her body. Pt cleared her buttocks off the bed and was able to reach a half-stand. Unable to continue d/t BM and need to address hygiene. Transfer via Lift Equipment: Stedy  Ambulation/Gait                   Stairs             Wheelchair Mobility     Tilt Bed    Modified Rankin (Stroke Patients Only)  Balance Overall balance assessment: Needs assistance Sitting-balance support: Feet supported, Single extremity supported, No upper extremity supported Sitting balance-Leahy Scale: Poor Sitting balance - Comments: Pt sat EOB for a total of ~52mins. Initially RUE support on handrail with min-modA required at trunk d/t posterior lean. Pt was able to further engage core  and sit without UE support for a couple of minutes with supervision-CGA. Postural control: Posterior lean                                  Communication Communication Communication: No apparent difficulties  Cognition Arousal: Alert Behavior During Therapy: Flat affect   PT - Cognitive impairments: Memory, Initiation, Sequencing, Attention, Problem solving, Safety/Judgement, Awareness                         Following commands: Impaired Following commands impaired: Follows one step commands with increased time, Only follows one step commands consistently    Cueing Cueing Techniques: Verbal cues, Tactile cues, Gestural cues  Exercises      General Comments General comments (skin integrity, edema, etc.): During transfer with stedy pt hit LUE on device and developed 3 skin tears on her lateral elbow, RN notified. Pt with +2 pitting edema in BLE and LUE. Eleveated with pillows.      Pertinent Vitals/Pain Pain Assessment Pain Assessment: Faces Faces Pain Scale: Hurts little more Pain Location: Back Pain Descriptors / Indicators: Aching, Discomfort, Sore Pain Intervention(s): Monitored during session    Home Living                          Prior Function            PT Goals (current goals can now be found in the care plan section) Acute Rehab PT Goals Patient Stated Goal: Try standing Progress towards PT goals: Progressing toward goals    Frequency    Min 1X/week      PT Plan      Co-evaluation              AM-PAC PT "6 Clicks" Mobility   Outcome Measure  Help needed turning from your back to your side while in a flat bed without using bedrails?: Total Help needed moving from lying on your back to sitting on the side of a flat bed without using bedrails?: Total Help needed moving to and from a bed to a chair (including a wheelchair)?: Total Help needed standing up from a chair using your arms (e.g., wheelchair or bedside  chair)?: Total Help needed to walk in hospital room?: Total Help needed climbing 3-5 steps with a railing? : Total 6 Click Score: 6    End of Session Equipment Utilized During Treatment: Gait belt Activity Tolerance: Patient limited by fatigue Patient left: in bed;with call bell/phone within reach;with bed alarm set Nurse Communication: Mobility status;Need for lift equipment PT Visit Diagnosis: Muscle weakness (generalized) (M62.81);Hemiplegia and hemiparesis;Pain;Other abnormalities of gait and mobility (R26.89) Hemiplegia - Right/Left: Left Hemiplegia - dominant/non-dominant: Non-dominant Hemiplegia - caused by: Other Nontraumatic intracranial hemorrhage Pain - Right/Left:  (Both) Pain - part of body:  (Back)     Time: 0454-0981 PT Time Calculation (min) (ACUTE ONLY): 30 min  Charges:    $Therapeutic Activity: 23-37 mins PT General Charges $$ ACUTE PT VISIT: 1 Visit  Cheri Guppy, PT, DPT Acute Rehabilitation Services Office: [phone] Secure Chat Preferred  Richardson Chiquito 03/01/2023, 1:08 PM

## 2023-03-01 NOTE — Assessment & Plan Note (Addendum)
 Noted on exam yesterday in the bilateral lower extremities. Gave a dose of lasix 40 mg PO with minimal improvement, will give a repeat dose today. CTM

## 2023-03-04 ENCOUNTER — Ambulatory Visit: Payer: HMO

## 2023-03-04 ENCOUNTER — Ambulatory Visit
Admission: RE | Admit: 2023-03-04 | Discharge: 2023-03-04 | Disposition: A | Discharge status: Home / Self Care | Payer: HMO | Source: Ambulatory Visit | Attending: Radiation Oncology | Admitting: Radiation Oncology

## 2023-03-04 DIAGNOSIS — C221 Intrahepatic bile duct carcinoma: Secondary | ICD-10-CM | POA: Diagnosis not present

## 2023-03-04 DIAGNOSIS — A0811 Acute gastroenteropathy due to Norwalk agent: Secondary | ICD-10-CM | POA: Diagnosis not present

## 2023-03-04 DIAGNOSIS — R4182 Altered mental status, unspecified: Secondary | ICD-10-CM | POA: Diagnosis not present

## 2023-03-04 DIAGNOSIS — B338 Other specified viral diseases: Secondary | ICD-10-CM | POA: Diagnosis not present

## 2023-03-04 NOTE — Progress Notes (Addendum)
  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 Joellen Jersey personal review of the PET scan), and also the T9 level on the right (as documented in provider EOT note)  The patient was not available for call today. Voicemail left.  The patient is scheduled for ongoing care with Dr. Ellin Saba  in medical oncology. The patient was encouraged to call if she develops concerns or questions regarding radiation.    Ruel Favors, LPN

## 2023-03-05 DIAGNOSIS — L89310 Pressure ulcer of right buttock, unstageable: Secondary | ICD-10-CM | POA: Diagnosis not present

## 2023-03-05 DIAGNOSIS — Z794 Long term (current) use of insulin: Secondary | ICD-10-CM | POA: Diagnosis not present

## 2023-03-05 DIAGNOSIS — C799 Secondary malignant neoplasm of unspecified site: Secondary | ICD-10-CM | POA: Diagnosis not present

## 2023-03-05 DIAGNOSIS — G893 Neoplasm related pain (acute) (chronic): Secondary | ICD-10-CM | POA: Diagnosis not present

## 2023-03-05 DIAGNOSIS — L89153 Pressure ulcer of sacral region, stage 3: Secondary | ICD-10-CM | POA: Diagnosis not present

## 2023-03-05 DIAGNOSIS — E11649 Type 2 diabetes mellitus with hypoglycemia without coma: Secondary | ICD-10-CM | POA: Diagnosis not present

## 2023-03-07 ENCOUNTER — Ambulatory Visit (HOSPITAL_COMMUNITY): Payer: HMO

## 2023-03-07 DIAGNOSIS — E7800 Pure hypercholesterolemia, unspecified: Secondary | ICD-10-CM | POA: Diagnosis not present

## 2023-03-08 DIAGNOSIS — E119 Type 2 diabetes mellitus without complications: Secondary | ICD-10-CM | POA: Diagnosis not present

## 2023-03-08 DIAGNOSIS — I10 Essential (primary) hypertension: Secondary | ICD-10-CM | POA: Diagnosis not present

## 2023-03-11 NOTE — Progress Notes (Deleted)
 Palliative Medicine [facility]  [phone] [phone]   Name: Erin Hoffman Date: 03/11/2023 MRN: [id number]  DOB: 1945/12/04  Patient Care Team: [name], MD as PCP - General (Internal Medicine)    REASON FOR CONSULTATION: Erin Hoffman is a 77 y.o. female with oncologic medical history including cholangiocarcinoma (10/2022) with metastatic disease to the bone. Palliative ask to see for symptom management and goals of care.    SOCIAL HISTORY:     reports that she quit smoking about 34 years ago. Her smoking use included cigarettes. She started smoking about 64 years ago. She has a 30 pack-year smoking history. She has never used smokeless tobacco. She reports that she does not drink alcohol and does not use drugs.  ADVANCE DIRECTIVES:  Patient has MOST form on file  CODE STATUS: DNR  PAST MEDICAL HISTORY: Past Medical History:  Diagnosis Date   Anemia    Anxiety    Diabetes mellitus    H/O hiatal hernia    Hypertension    Hypothyroidism    Norovirus 02/17/2023    PAST SURGICAL HISTORY:  Past Surgical History:  Procedure Laterality Date   ABDOMINAL HYSTERECTOMY     CATARACT EXTRACTION W/PHACO Left 06/16/2013   Procedure: CATARACT EXTRACTION PHACO AND INTRAOCULAR LENS PLACEMENT (IOC);  Surgeon: [name] T. 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  MEDICATIONS:  Current Outpatient Medications  Medication Sig Dispense Refill   acetaminophen (TYLENOL) 325 MG tablet Take 650 mg by mouth in the morning and at bedtime.     Amino Acids-Protein Hydrolys (PRO-STAT PO) Take 30 mLs by mouth daily.     famotidine (PEPCID) 20 MG tablet Take 20 mg by mouth 2 (two) times daily as needed (GERD).     feeding supplement, GLUCERNA SHAKE, (GLUCERNA SHAKE) LIQD Take 237 mLs by mouth 2 (two) times daily between meals.     fenofibrate micronized (LOFIBRA) 134 MG capsule Take 134 mg by mouth daily before breakfast.     fentaNYL (DURAGESIC) 12 MCG/HR Place 1 patch onto the skin every 3 (three) days. 5 patch 0   fexofenadine (ALLEGRA) 180 MG tablet Take 180 mg by mouth daily as needed for allergies or rhinitis.     gabapentin (NEURONTIN) 100 MG capsule Take 1 capsule (100 mg total) by mouth 2 (two) times daily.     insulin aspart (NOVOLOG) 100 UNIT/ML injection Inject 0-9 Units into the skin 3 (three) times daily with meals.     insulin  glargine-yfgn (SEMGLEE) 100 UNIT/ML injection Inject 0.05 mLs (5 Units total) into the skin daily.     levothyroxine (SYNTHROID, LEVOTHROID) 137 MCG tablet Take 137 mcg by mouth daily before breakfast.     magnesium hydroxide (MILK OF MAGNESIA) 400 MG/5ML suspension Take 30 mLs by mouth daily as needed for mild constipation.     magnesium oxide (MAG-OX) 400 (240 Mg) MG tablet Take 1 tablet (400 mg total) by mouth 2 (two) times daily. 60 tablet 6   Nutritional Supplements (,FEEDING SUPPLEMENT, PROSOURCE PLUS) liquid Take 30 mLs by mouth at  bedtime.     oxycodone (OXY-IR) 5 MG capsule Take 1 capsule (5 mg total) by mouth every 4 (four) hours as needed for pain. 30 capsule 0   pantoprazole (PROTONIX) 40 MG tablet Take 1 tablet (40 mg total) by mouth daily. 30 tablet 0   polyethylene glycol (MIRALAX / GLYCOLAX) 17 g packet Take 17 g by mouth daily.     promethazine (PHENERGAN) 25 MG tablet Take 25 mg by mouth every 6 (six) hours as needed for nausea or vomiting.     rosuvastatin (CRESTOR) 20 MG tablet Take 1 tablet (20 mg total) by mouth at bedtime. 30 tablet 0   No current facility-administered medications for this visit.    VITAL SIGNS: There were no vitals taken for this visit. There were no vitals filed for this visit.  Estimated body mass index is 27.8 kg/m as calculated from the following:   Height as of 02/16/23: 5\' 9"  (1.753 m).   Weight as of 02/26/23: 188 lb 4.4 oz (85.4 kg).  LABS: CBC:    Component Value Date/Time   WBC 13.6 (H) 02/25/2023 0411   HGB 11.3 (L) 02/25/2023 0411   HCT 34.1 (L) 02/25/2023 0411   PLT 347 02/25/2023 0411   MCV 91.4 02/25/2023 0411   NEUTROABS 8.6 (H) 02/16/2023 1250   LYMPHSABS 1.0 02/16/2023 1250   MONOABS 0.7 02/16/2023 1250   EOSABS 0.0 02/16/2023 1250   BASOSABS 0.0 02/16/2023 1250   Comprehensive Metabolic Panel:    Component Value Date/Time   NA 130 (L) 03/01/2023 0330   K 4.1 03/01/2023 0330   CL 98 03/01/2023 0330   CO2 24 03/01/2023 0330   BUN 20 03/01/2023 0330   CREATININE 0.63 03/01/2023 0330   CREATININE 0.57 02/15/2023 1108   GLUCOSE 134 (H) 03/01/2023 0330   CALCIUM 8.7 (L) 03/01/2023 0330   AST 44 (H) 02/28/2023 1734   AST 18 02/15/2023 1108   ALT 14 02/28/2023 1734   ALT 6 02/15/2023 1108   ALKPHOS 143 (H) 02/28/2023 1734   BILITOT 0.6 02/28/2023 1734   BILITOT 0.8 02/15/2023 1108   PROT 5.1 (L) 02/28/2023 1734   ALBUMIN 1.7 (L) 02/28/2023 1734    RADIOGRAPHIC STUDIES:  PERFORMANCE STATUS (ECOG) : {CHL ONC ECOG NW:[id number]}  Review of  Systems Unless otherwise noted, a complete review of systems is negative.  Physical Exam General: NAD Cardiovascular: regular rate and rhythm Pulmonary: clear ant fields Abdomen: soft, nontender, + bowel sounds Extremities: no edema, no joint deformities Skin: no rashes Neurological: Alert and oriented x3  IMPRESSION: *** I introduced myself, Tyara Dassow RN, and Palliative's role in collaboration with the oncology team. Concept of Palliative Care was introduced as specialized medical care for people and their families living with serious illness.  It focuses on providing relief from the symptoms and stress of a serious illness.  The goal is to improve quality of life for both the patient  and the family. Values and goals of care important to patient and family were attempted to be elicited.    We discussed *** current illness and what it means in the larger context of *** on-going co-morbidities. Natural disease trajectory and expectations were discussed.  I discussed the importance of continued conversation with family and their medical providers regarding overall plan of care and treatment options, ensuring decisions are within the context of the patients values and GOCs.  PLAN: Established therapeutic relationship. Education provided on palliative's role in collaboration with their Oncology/Radiation team. I will plan to see patient back in 2-4 weeks in collaboration to other oncology appointments.    Patient expressed understanding and was in agreement with this plan. She also understands that She can call the clinic at any time with any questions, concerns, or complaints.   Thank you for your referral and allowing Palliative to assist in Mrs. Ndidi Nesby [facility] care.   Number and complexity of problems addressed: ***HIGH - 1 or more chronic illnesses with SEVERE exacerbation, progression, or side effects of treatment - advanced cancer, pain. Any controlled substances utilized were  prescribed in the context of palliative care.   Visit consisted of counseling and education dealing with the complex and emotionally intense issues of symptom management and palliative care in the setting of serious and potentially life-threatening illness.  Signed by: Willette Alma, AGPCNP-BC Palliative Medicine Team/[facility]

## 2023-03-12 ENCOUNTER — Telehealth: Payer: Self-pay

## 2023-03-12 ENCOUNTER — Other Ambulatory Visit: Payer: Self-pay

## 2023-03-12 ENCOUNTER — Telehealth: Payer: Self-pay | Admitting: *Deleted

## 2023-03-12 ENCOUNTER — Inpatient Hospital Stay: Payer: HMO | Admitting: Nurse Practitioner

## 2023-03-12 ENCOUNTER — Telehealth (HOSPITAL_COMMUNITY): Payer: Self-pay | Admitting: Oncology

## 2023-03-12 DIAGNOSIS — C221 Intrahepatic bile duct carcinoma: Secondary | ICD-10-CM

## 2023-03-12 DIAGNOSIS — G893 Neoplasm related pain (acute) (chronic): Secondary | ICD-10-CM

## 2023-03-12 DIAGNOSIS — L89150 Pressure ulcer of sacral region, unstageable: Secondary | ICD-10-CM | POA: Diagnosis not present

## 2023-03-12 DIAGNOSIS — I639 Cerebral infarction, unspecified: Secondary | ICD-10-CM

## 2023-03-12 DIAGNOSIS — R160 Hepatomegaly, not elsewhere classified: Secondary | ICD-10-CM

## 2023-03-12 DIAGNOSIS — C7951 Secondary malignant neoplasm of bone: Secondary | ICD-10-CM

## 2023-03-12 DIAGNOSIS — L89310 Pressure ulcer of right buttock, unstageable: Secondary | ICD-10-CM | POA: Diagnosis not present

## 2023-03-12 NOTE — Telephone Encounter (Signed)
 Unable to reach patient's son Genevie Cheshire but left a voicemail for him to call be back at Dr. Zenda Alpers office. Was able to speak with other son, [name], who was able to tell me that his mother was currently at [facility].

## 2023-03-12 NOTE — Telephone Encounter (Signed)
 Opened in error

## 2023-03-12 NOTE — Progress Notes (Signed)
 Per Dr. Arlana Pouch and order was placed for a Hospice Consult. Unable to reach son, Genevie Cheshire, so did place [facility]. Contacted intake specialist, Sherri, who said they would "take care" of everything. She said she would follow up with patient's son Genevie Cheshire.

## 2023-03-12 NOTE — Telephone Encounter (Signed)
 I called to reschedule her PET scan and her son stated he did not know why this was needed .  I then contacted [facility] [employer] to advise her of this as well .  No appt was scheduled during this call.

## 2023-03-13 ENCOUNTER — Telehealth: Payer: Self-pay | Admitting: Nurse Practitioner

## 2023-03-15 ENCOUNTER — Ambulatory Visit: Payer: HMO | Admitting: Oncology

## 2023-03-15 ENCOUNTER — Other Ambulatory Visit: Payer: HMO

## 2023-03-22 ENCOUNTER — Other Ambulatory Visit: Payer: Self-pay

## 2023-04-09 DEATH — deceased

## 2023-08-16 ENCOUNTER — Other Ambulatory Visit: Payer: Self-pay
